# Patient Record
Sex: Female | Born: 1965
Health system: Southern US, Community
[De-identification: ages and names within clinical notes are randomized; demographics above are authoritative.]

## PROBLEM LIST (undated history)

## (undated) DIAGNOSIS — R4701 Aphasia: Secondary | ICD-10-CM

## (undated) DIAGNOSIS — I10 Essential (primary) hypertension: Secondary | ICD-10-CM

## (undated) DIAGNOSIS — Z8673 Personal history of transient ischemic attack (TIA), and cerebral infarction without residual deficits: Secondary | ICD-10-CM

## (undated) DIAGNOSIS — Z8489 Family history of other specified conditions: Secondary | ICD-10-CM

## (undated) DIAGNOSIS — I5022 Chronic systolic (congestive) heart failure: Secondary | ICD-10-CM

## (undated) DIAGNOSIS — I700 Atherosclerosis of aorta: Secondary | ICD-10-CM

## (undated) DIAGNOSIS — E119 Type 2 diabetes mellitus without complications: Secondary | ICD-10-CM

## (undated) DIAGNOSIS — I429 Cardiomyopathy, unspecified: Secondary | ICD-10-CM

## (undated) DIAGNOSIS — I471 Supraventricular tachycardia, unspecified: Secondary | ICD-10-CM

## (undated) DIAGNOSIS — I499 Cardiac arrhythmia, unspecified: Secondary | ICD-10-CM

## (undated) DIAGNOSIS — R569 Unspecified convulsions: Secondary | ICD-10-CM

## (undated) DIAGNOSIS — R911 Solitary pulmonary nodule: Secondary | ICD-10-CM

## (undated) DIAGNOSIS — C801 Malignant (primary) neoplasm, unspecified: Secondary | ICD-10-CM

## (undated) DIAGNOSIS — J45909 Unspecified asthma, uncomplicated: Secondary | ICD-10-CM

---

## 2009-07-28 ENCOUNTER — Emergency Department (HOSPITAL_BASED_OUTPATIENT_CLINIC_OR_DEPARTMENT_OTHER): Admission: EM | Admit: 2009-07-28 | Discharge: 2009-07-28 | Payer: Self-pay | Admitting: Emergency Medicine

## 2009-08-10 HISTORY — PX: ABDOMINAL HYSTERECTOMY: SHX81

## 2009-11-08 ENCOUNTER — Ambulatory Visit: Payer: Self-pay | Admitting: Gynecologic Oncology

## 2009-11-19 ENCOUNTER — Ambulatory Visit: Payer: Self-pay | Admitting: Gynecologic Oncology

## 2009-12-08 ENCOUNTER — Ambulatory Visit: Payer: Self-pay | Admitting: Gynecologic Oncology

## 2009-12-10 ENCOUNTER — Ambulatory Visit: Payer: Self-pay | Admitting: Gynecologic Oncology

## 2010-01-08 ENCOUNTER — Ambulatory Visit: Payer: Self-pay | Admitting: Gynecologic Oncology

## 2010-01-21 ENCOUNTER — Ambulatory Visit: Payer: Self-pay | Admitting: Gynecologic Oncology

## 2010-02-07 ENCOUNTER — Ambulatory Visit: Payer: Self-pay | Admitting: Gynecologic Oncology

## 2010-03-04 ENCOUNTER — Ambulatory Visit: Payer: Self-pay | Admitting: Gynecologic Oncology

## 2010-04-10 ENCOUNTER — Ambulatory Visit: Payer: Self-pay | Admitting: Gynecologic Oncology

## 2010-05-06 ENCOUNTER — Ambulatory Visit: Payer: Self-pay | Admitting: Gynecologic Oncology

## 2010-05-10 ENCOUNTER — Ambulatory Visit: Payer: Self-pay | Admitting: Radiation Oncology

## 2010-05-20 ENCOUNTER — Ambulatory Visit: Payer: Self-pay | Admitting: Gynecologic Oncology

## 2010-05-22 LAB — CA 125: CA 125: 11.3 U/mL (ref 0.0–34.0)

## 2010-06-10 ENCOUNTER — Ambulatory Visit: Payer: Self-pay | Admitting: Radiation Oncology

## 2010-06-10 ENCOUNTER — Ambulatory Visit: Payer: Self-pay | Admitting: Gynecologic Oncology

## 2010-09-23 ENCOUNTER — Ambulatory Visit: Payer: Self-pay | Admitting: Gynecologic Oncology

## 2010-10-09 ENCOUNTER — Ambulatory Visit: Payer: Self-pay | Admitting: Gynecologic Oncology

## 2010-11-10 LAB — CBC
HCT: 42.4 % (ref 36.0–46.0)
MCHC: 33.8 g/dL (ref 30.0–36.0)
MCV: 86.7 fL (ref 78.0–100.0)
Platelets: 294 10*3/uL (ref 150–400)
RDW: 11.6 % (ref 11.5–15.5)

## 2010-11-10 LAB — DIFFERENTIAL
Basophils Absolute: 0.1 10*3/uL (ref 0.0–0.1)
Basophils Relative: 1 % (ref 0–1)
Eosinophils Absolute: 0.3 10*3/uL (ref 0.0–0.7)
Eosinophils Relative: 2 % (ref 0–5)
Monocytes Absolute: 0.7 10*3/uL (ref 0.1–1.0)

## 2011-01-20 ENCOUNTER — Ambulatory Visit: Payer: Self-pay | Admitting: Gynecologic Oncology

## 2011-02-08 ENCOUNTER — Ambulatory Visit: Payer: Self-pay | Admitting: Gynecologic Oncology

## 2011-07-07 ENCOUNTER — Ambulatory Visit: Payer: Self-pay | Admitting: Gynecologic Oncology

## 2011-07-11 ENCOUNTER — Ambulatory Visit: Payer: Self-pay | Admitting: Gynecologic Oncology

## 2011-08-25 ENCOUNTER — Encounter: Payer: Self-pay | Admitting: *Deleted

## 2011-10-02 IMAGING — CT CT ABD-PELV W/ CM
1 of 3 series · 13 of 32 positions shown, 18 images · non-contrast
Comparison: none

REASON FOR EXAM: restaging endometrial CA
COMMENTS:

[Series 2: abd with 5.0 i40f · axial · 0.83mm/px · z∈[-574,-190]mm · 13 of 89 slices shown, 18 images]
[im 6/89  soft-tissue]
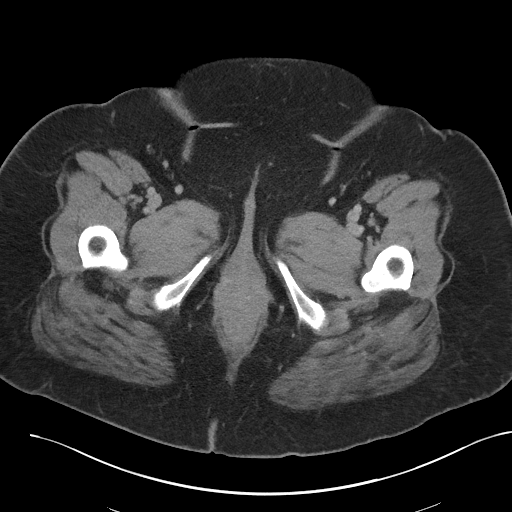
[im 6/89  bone]
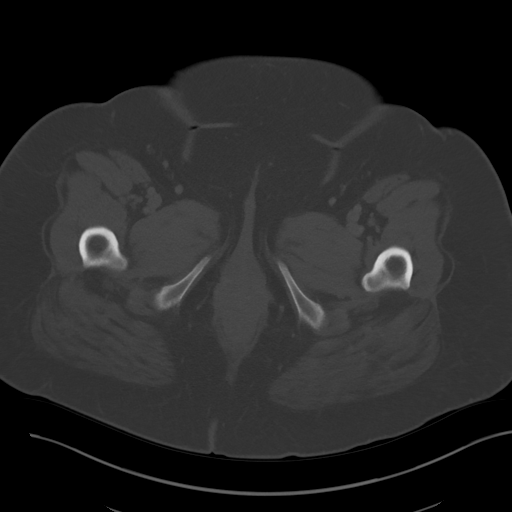
[im 16/89  soft-tissue]
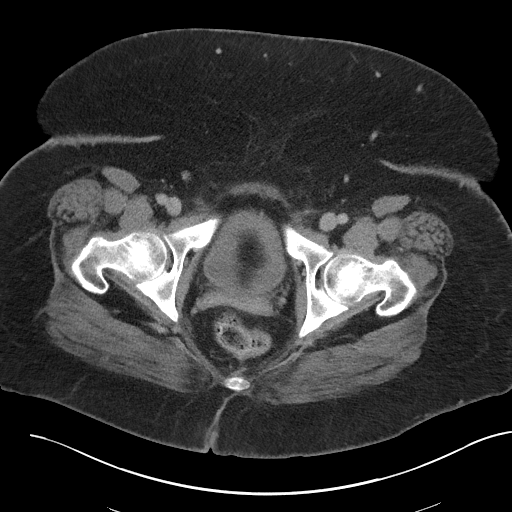
[im 21/89  soft-tissue]
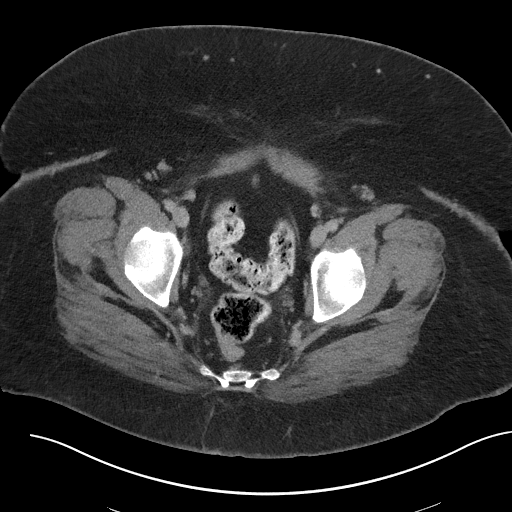
[im 26/89  soft-tissue]
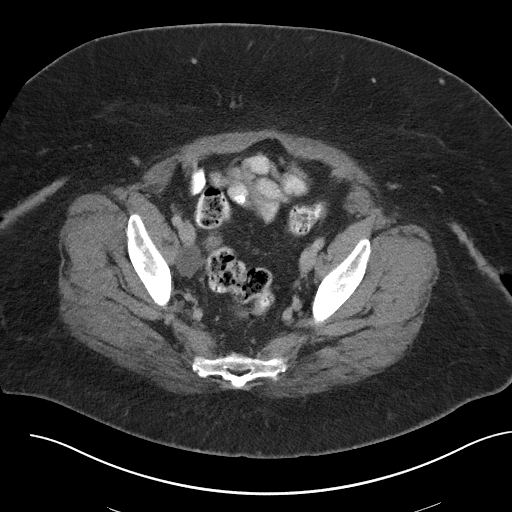
[im 37/89  soft-tissue]
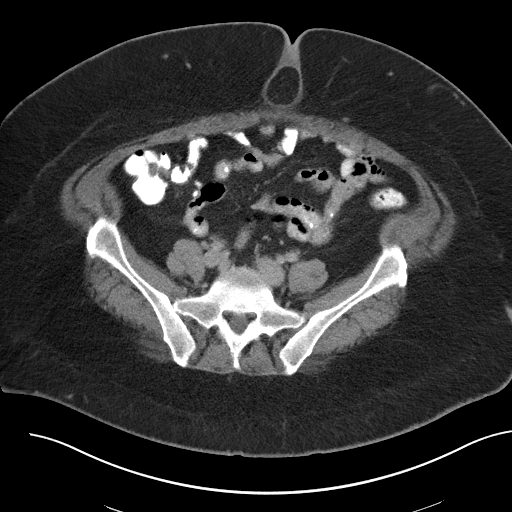
[im 42/89  soft-tissue]
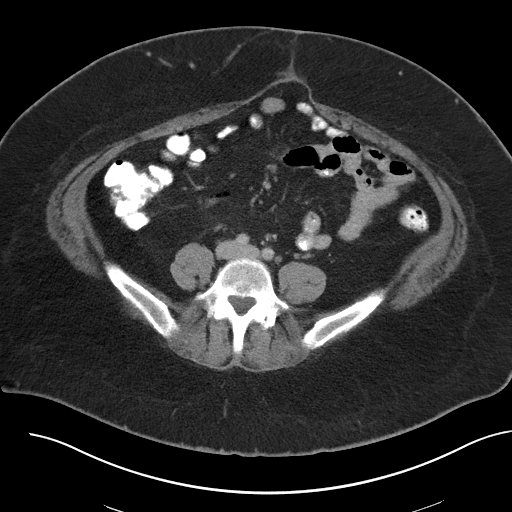
[im 47/89  soft-tissue]
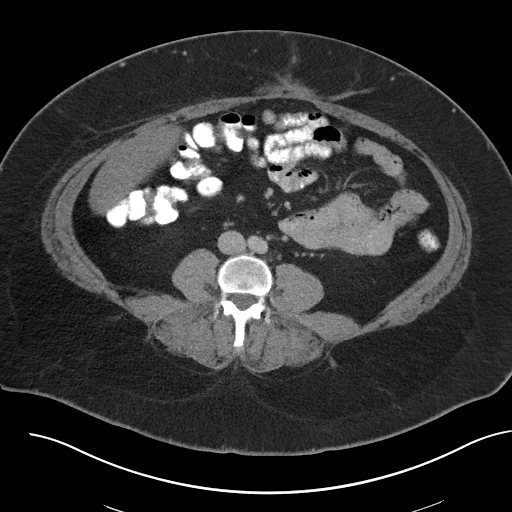
[im 57/89  soft-tissue]
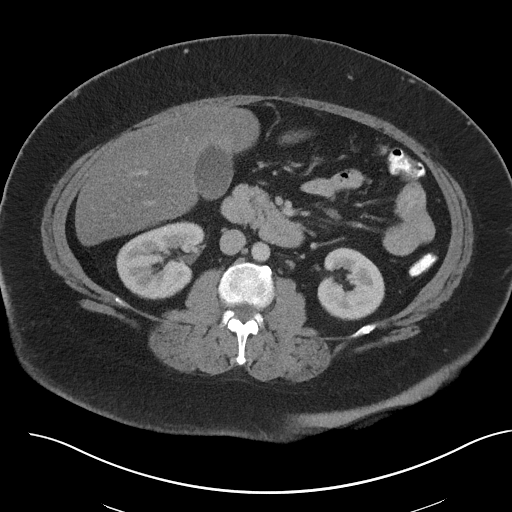
[im 63/89  soft-tissue]
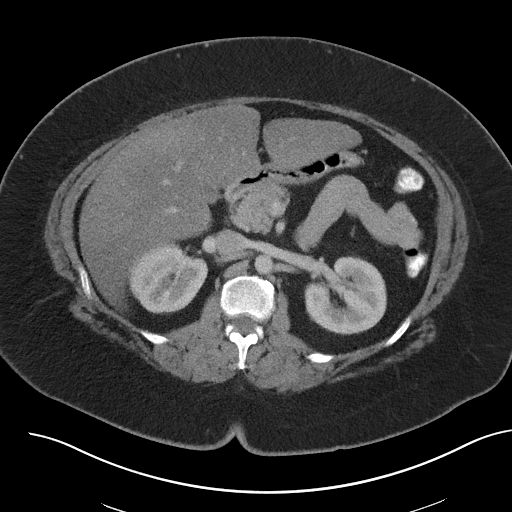
[im 63/89  bone]
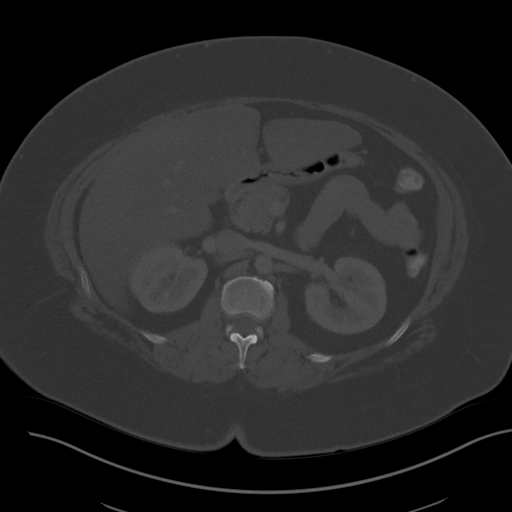
[im 68/89  soft-tissue]
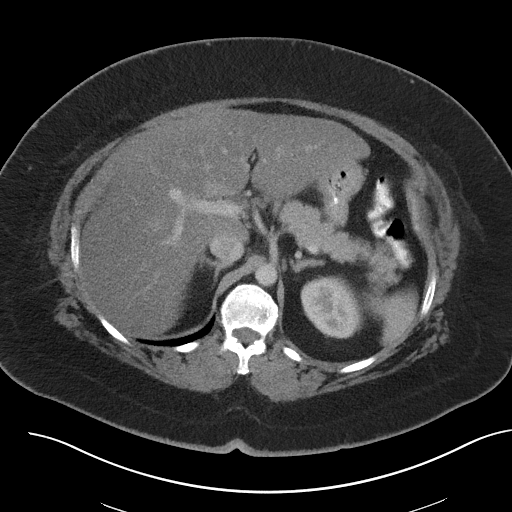
[im 68/89  lung]
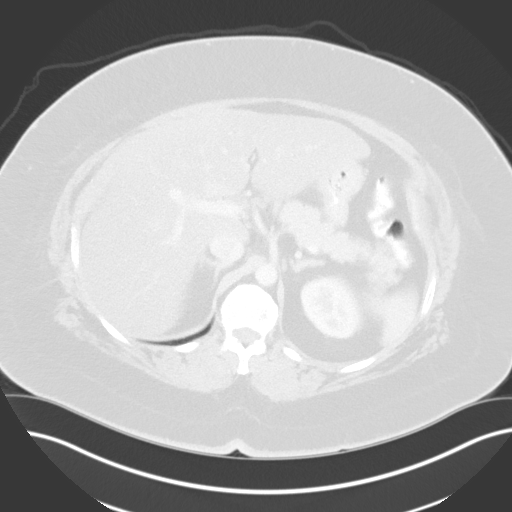
[im 73/89  lung]
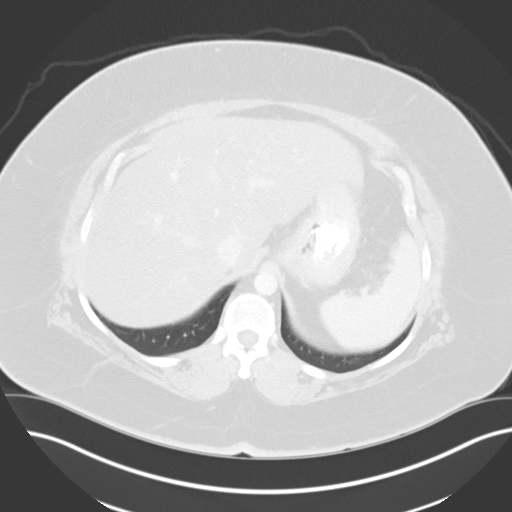
[im 78/89  soft-tissue]
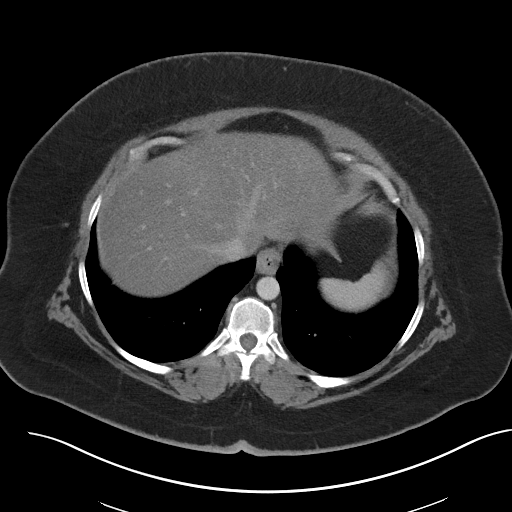
[im 78/89  lung]
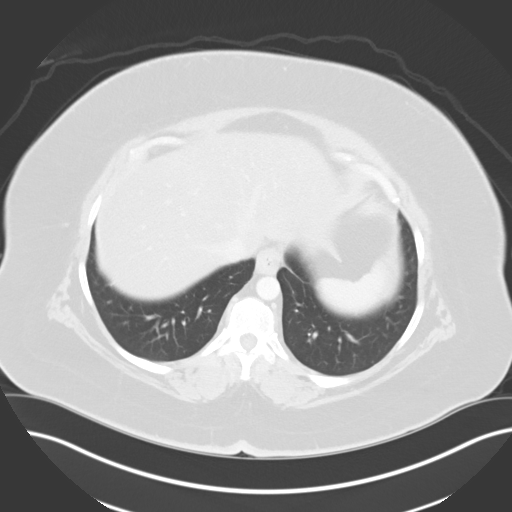
[im 83/89  soft-tissue]
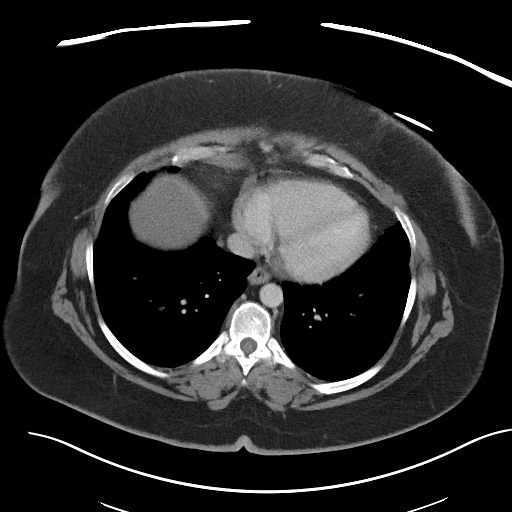
[im 83/89  lung]
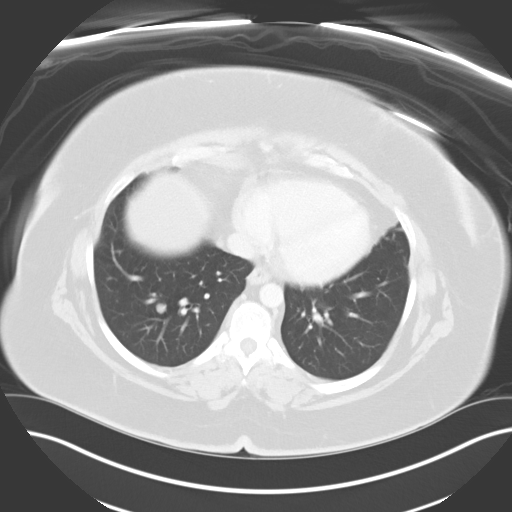

[13 of 32 positions shown; findings below may reference images not displayed]

PROCEDURE:     KCT - KCT ABDOMEN/PELVIS W  - September 23, 2010  [DATE]

RESULT:     Axial CT scanning was performed through the abdomen and pelvis
at 5 mm intervals and slice thicknesses following intravenous administration
of 100 cc of Xsovue-O3G. Review of multiplanar reconstructed images was
performed separately on the VIA monitor. The patient received oral contrast
material in addition to the aforementioned IV contrast.

Within the pelvis the uterus is surgically absent. I see no adnexal mass on
the left. On the right there is a soft tissue density structure with
Hounsfield measurement of +1 which measures approximately 2.5 x 3.2 cm and
may reflect a cystic right ovarian process. The rectosigmoid colon is normal
in appearance. The partially distended urinary bladder is also normal. I see
no inguinal hernia. There is a broadly necked ventral hernia which is fairly
shallow. It measures 3 cm deep and has a neck measuring 6.7 centimeters and
contains loops of normal appearing small bowel. The bowel exhibits no
evidence of ileus or obstruction or acute inflammation elsewhere.

The liver exhibits decreased density diffusely consistent with fatty
infiltration. There is no focal mass nor ductal dilation. The gallbladder is
adequately distended with no evidence of calcified stones. The pancreas,
spleen, partially distended stomach, adrenal glands, and kidneys are normal
in appearance. There is a subcentimeter hypodensity anteriorly in the mid
pole of the left kidney which is too small to accurately characterize with
Hounsfield measurement. The caliber of the abdominal aorta is normal. I see
no periaortic nor pericaval lymphadenopathy.

The lung bases are clear. The lumbar vertebral bodies are preserved in
height with the exception of L1 where there is mild anterior wedge
compression with loss of height of 15%.
IMPRESSION: 1. I do not see evidence of intra-abdominal nor pelvic lymphadenopathy.
There is a hypodensity in the right adnexal region which likely reflects a
cystic ovarian process given that its elsewhere value is +1.
2. I do not see evidence of omental masses or infiltration of the on
mesenteric fat nor ascites.
3. There is a broadly necked ventral hernia containing loops of normal
appearing small bowel.
4. There is decreased density in the liver which likely reflects fatty
infiltration. I see no acute hepatobiliary abnormality nor acute urinary
tract abnormality.

## 2011-11-03 ENCOUNTER — Ambulatory Visit: Payer: Self-pay | Admitting: Gynecologic Oncology

## 2011-11-04 LAB — CA 125: CA 125: 9.8 U/mL (ref 0.0–34.0)

## 2011-11-09 ENCOUNTER — Ambulatory Visit: Payer: Self-pay | Admitting: Gynecologic Oncology

## 2012-05-03 ENCOUNTER — Ambulatory Visit: Payer: Self-pay | Admitting: Gynecologic Oncology

## 2012-05-10 ENCOUNTER — Ambulatory Visit: Payer: Self-pay | Admitting: Gynecologic Oncology

## 2012-10-08 ENCOUNTER — Ambulatory Visit: Payer: Self-pay | Admitting: Gynecologic Oncology

## 2012-11-08 ENCOUNTER — Ambulatory Visit: Payer: Self-pay | Admitting: Gynecologic Oncology

## 2013-07-25 ENCOUNTER — Ambulatory Visit: Payer: Self-pay | Admitting: Gynecologic Oncology

## 2013-08-10 ENCOUNTER — Ambulatory Visit: Payer: Self-pay | Admitting: Gynecologic Oncology

## 2014-12-31 ENCOUNTER — Other Ambulatory Visit: Payer: Self-pay | Admitting: General Surgery

## 2014-12-31 ENCOUNTER — Ambulatory Visit (HOSPITAL_COMMUNITY): Payer: 59 | Admitting: Certified Registered Nurse Anesthetist

## 2014-12-31 ENCOUNTER — Encounter (HOSPITAL_COMMUNITY): Payer: Self-pay | Admitting: *Deleted

## 2014-12-31 ENCOUNTER — Encounter (HOSPITAL_COMMUNITY): Admission: RE | Disposition: A | Discharge status: Home Health | Payer: Self-pay | Source: Ambulatory Visit

## 2014-12-31 ENCOUNTER — Inpatient Hospital Stay (HOSPITAL_COMMUNITY)
Admission: RE | Admit: 2014-12-31 | Discharge: 2015-01-04 | DRG: 571 | Disposition: A | Discharge status: Home Health | Payer: 59 | Source: Ambulatory Visit | Attending: Surgery | Admitting: Surgery

## 2014-12-31 DIAGNOSIS — L02211 Cutaneous abscess of abdominal wall: Secondary | ICD-10-CM | POA: Diagnosis present

## 2014-12-31 DIAGNOSIS — Z8543 Personal history of malignant neoplasm of ovary: Secondary | ICD-10-CM

## 2014-12-31 DIAGNOSIS — D72829 Elevated white blood cell count, unspecified: Secondary | ICD-10-CM | POA: Diagnosis present

## 2014-12-31 DIAGNOSIS — B9561 Methicillin susceptible Staphylococcus aureus infection as the cause of diseases classified elsewhere: Secondary | ICD-10-CM | POA: Diagnosis present

## 2014-12-31 DIAGNOSIS — E65 Localized adiposity: Secondary | ICD-10-CM | POA: Diagnosis present

## 2014-12-31 DIAGNOSIS — F419 Anxiety disorder, unspecified: Secondary | ICD-10-CM | POA: Diagnosis present

## 2014-12-31 DIAGNOSIS — L02231 Carbuncle of abdominal wall: Principal | ICD-10-CM | POA: Diagnosis present

## 2014-12-31 DIAGNOSIS — E119 Type 2 diabetes mellitus without complications: Secondary | ICD-10-CM

## 2014-12-31 DIAGNOSIS — Z6835 Body mass index (BMI) 35.0-35.9, adult: Secondary | ICD-10-CM

## 2014-12-31 DIAGNOSIS — J45909 Unspecified asthma, uncomplicated: Secondary | ICD-10-CM | POA: Diagnosis present

## 2014-12-31 DIAGNOSIS — E669 Obesity, unspecified: Secondary | ICD-10-CM | POA: Diagnosis present

## 2014-12-31 HISTORY — DX: Type 2 diabetes mellitus without complications: E11.9

## 2014-12-31 HISTORY — DX: Essential (primary) hypertension: I10

## 2014-12-31 HISTORY — DX: Cardiac arrhythmia, unspecified: I49.9

## 2014-12-31 HISTORY — DX: Family history of other specified conditions: Z84.89

## 2014-12-31 HISTORY — DX: Unspecified convulsions: R56.9

## 2014-12-31 HISTORY — DX: Malignant (primary) neoplasm, unspecified: C80.1

## 2014-12-31 HISTORY — DX: Unspecified asthma, uncomplicated: J45.909

## 2014-12-31 HISTORY — PX: IRRIGATION AND DEBRIDEMENT ABDOMEN: SHX6600

## 2014-12-31 LAB — CREATININE, SERUM
Creatinine, Ser: 0.51 mg/dL (ref 0.44–1.00)
GFR calc non Af Amer: >60 mL/min (ref >60)

## 2014-12-31 LAB — CBC
HEMATOCRIT: 42.7 % (ref 36.0–46.0)
HEMOGLOBIN: 13.8 g/dL (ref 12.0–15.0)
MCH: 28.8 pg (ref 26.0–34.0)
MCHC: 32.3 g/dL (ref 30.0–36.0)
MCV: 89.1 fL (ref 78.0–100.0)
Platelets: 311 10*3/uL (ref 150–400)
RBC: 4.79 MIL/uL (ref 3.87–5.11)
RDW: 12.7 % (ref 11.5–15.5)
WBC: 19.5 10*3/uL — AB (ref 4.0–10.5)

## 2014-12-31 LAB — GLUCOSE, CAPILLARY
GLUCOSE-CAPILLARY: 199 mg/dL — AB (ref 65–99)
Glucose-Capillary: 126 mg/dL — ABNORMAL HIGH (ref 65–99)
Glucose-Capillary: 92 mg/dL (ref 65–99)

## 2014-12-31 LAB — BASIC METABOLIC PANEL
Anion gap: 14 (ref 5–15)
BUN: 18 mg/dL (ref 6–20)
CALCIUM: 10.2 mg/dL (ref 8.9–10.3)
CO2: 24 mmol/L (ref 22–32)
CREATININE: 0.67 mg/dL (ref 0.44–1.00)
Chloride: 103 mmol/L (ref 101–111)
GFR calc non Af Amer: >60 mL/min (ref >60)
Glucose, Bld: 140 mg/dL — ABNORMAL HIGH (ref 65–99)
Potassium: 3.9 mmol/L (ref 3.5–5.1)
SODIUM: 141 mmol/L (ref 135–145)

## 2014-12-31 SURGERY — IRRIGATION AND DEBRIDEMENT ABDOMEN
Anesthesia: General | Site: Abdomen

## 2014-12-31 MED ORDER — PIPERACILLIN-TAZOBACTAM 3.375 G IVPB
3.3750 g | Freq: Three times a day (TID) | INTRAVENOUS | Status: DC
  Administered 2014-12-31: 3.375 g via INTRAVENOUS
  Filled 2014-12-31: qty 50

## 2014-12-31 MED ORDER — KETOROLAC TROMETHAMINE 30 MG/ML IJ SOLN: 30.0000 mg | Freq: Once | INTRAMUSCULAR | Status: DC | PRN

## 2014-12-31 MED ORDER — OXYCODONE HCL 5 MG PO TABS: 5.0000 mg | ORAL_TABLET | Freq: Once | ORAL | Status: DC | PRN

## 2014-12-31 MED ORDER — HYDROMORPHONE HCL 1 MG/ML IJ SOLN: 0.2500 mg | INTRAMUSCULAR | Status: DC | PRN

## 2014-12-31 MED ORDER — PROPOFOL 10 MG/ML IV BOLUS
INTRAVENOUS | Status: DC | PRN
  Administered 2014-12-31: 160 mg via INTRAVENOUS

## 2014-12-31 MED ORDER — PIPERACILLIN-TAZOBACTAM 3.375 G IVPB
INTRAVENOUS | Status: AC
  Filled 2014-12-31: qty 50

## 2014-12-31 MED ORDER — POTASSIUM CHLORIDE IN NACL 20-0.45 MEQ/L-% IV SOLN
INTRAVENOUS | Status: DC
  Administered 2014-12-31 – 2015-01-04 (×2): via INTRAVENOUS
  Filled 2014-12-31 (×2): qty 1000

## 2014-12-31 MED ORDER — LIDOCAINE HCL (CARDIAC) 20 MG/ML IV SOLN
INTRAVENOUS | Status: DC | PRN
  Administered 2014-12-31: 60 mg via INTRAVENOUS

## 2014-12-31 MED ORDER — FENTANYL CITRATE (PF) 100 MCG/2ML IJ SOLN
INTRAMUSCULAR | Status: AC
  Filled 2014-12-31: qty 2

## 2014-12-31 MED ORDER — INSULIN ASPART 100 UNIT/ML ~~LOC~~ SOLN
0.0000 [IU] | SUBCUTANEOUS | Status: DC
  Administered 2015-01-01 (×2): 3 [IU] via SUBCUTANEOUS

## 2014-12-31 MED ORDER — ONDANSETRON HCL 4 MG/2ML IJ SOLN
INTRAMUSCULAR | Status: AC
  Filled 2014-12-31: qty 2

## 2014-12-31 MED ORDER — FENTANYL CITRATE (PF) 100 MCG/2ML IJ SOLN
INTRAMUSCULAR | Status: DC | PRN
  Administered 2014-12-31: 100 ug via INTRAVENOUS

## 2014-12-31 MED ORDER — MIDAZOLAM HCL 5 MG/5ML IJ SOLN
INTRAMUSCULAR | Status: DC | PRN
  Administered 2014-12-31: 2 mg via INTRAVENOUS

## 2014-12-31 MED ORDER — OXYCODONE HCL 5 MG/5ML PO SOLN
5.0000 mg | Freq: Once | ORAL | Status: DC | PRN
  Filled 2014-12-31: qty 5

## 2014-12-31 MED ORDER — ONDANSETRON HCL 4 MG/2ML IJ SOLN: 4.0000 mg | Freq: Four times a day (QID) | INTRAMUSCULAR | Status: DC | PRN

## 2014-12-31 MED ORDER — ONDANSETRON HCL 4 MG PO TABS: 4.0000 mg | ORAL_TABLET | Freq: Four times a day (QID) | ORAL | Status: DC | PRN

## 2014-12-31 MED ORDER — LIDOCAINE HCL (CARDIAC) 20 MG/ML IV SOLN
INTRAVENOUS | Status: AC
  Filled 2014-12-31: qty 5

## 2014-12-31 MED ORDER — MORPHINE SULFATE 2 MG/ML IJ SOLN
1.0000 mg | INTRAMUSCULAR | Status: DC | PRN
  Administered 2015-01-01: 1 mg via INTRAVENOUS
  Filled 2014-12-31: qty 1

## 2014-12-31 MED ORDER — DEXAMETHASONE SODIUM PHOSPHATE 10 MG/ML IJ SOLN
INTRAMUSCULAR | Status: AC
  Filled 2014-12-31: qty 1

## 2014-12-31 MED ORDER — DEXAMETHASONE SODIUM PHOSPHATE 10 MG/ML IJ SOLN
INTRAMUSCULAR | Status: DC | PRN
  Administered 2014-12-31: 4 mg via INTRAVENOUS

## 2014-12-31 MED ORDER — SUCCINYLCHOLINE CHLORIDE 20 MG/ML IJ SOLN
INTRAMUSCULAR | Status: DC | PRN
  Administered 2014-12-31: 100 mg via INTRAVENOUS

## 2014-12-31 MED ORDER — MIDAZOLAM HCL 2 MG/2ML IJ SOLN
INTRAMUSCULAR | Status: AC
Start: 2014-12-31 — End: 2014-12-31
  Filled 2014-12-31: qty 2

## 2014-12-31 MED ORDER — PROMETHAZINE HCL 25 MG/ML IJ SOLN: 6.2500 mg | INTRAMUSCULAR | Status: DC | PRN

## 2014-12-31 MED ORDER — LACTATED RINGERS IV SOLN
INTRAVENOUS | Status: DC | PRN
  Administered 2014-12-31: 18:00:00 via INTRAVENOUS

## 2014-12-31 MED ORDER — PROPOFOL 10 MG/ML IV BOLUS
INTRAVENOUS | Status: AC
  Filled 2014-12-31: qty 20

## 2014-12-31 MED ORDER — PIPERACILLIN-TAZOBACTAM 3.375 G IVPB
3.3750 g | Freq: Three times a day (TID) | INTRAVENOUS | Status: DC
  Administered 2015-01-01 – 2015-01-03 (×7): 3.375 g via INTRAVENOUS
  Filled 2014-12-31 (×8): qty 50

## 2014-12-31 MED ORDER — ONDANSETRON HCL 4 MG/2ML IJ SOLN
INTRAMUSCULAR | Status: DC | PRN
  Administered 2014-12-31: 4 mg via INTRAVENOUS

## 2014-12-31 MED ORDER — HEPARIN SODIUM (PORCINE) 5000 UNIT/ML IJ SOLN
5000.0000 [IU] | Freq: Three times a day (TID) | INTRAMUSCULAR | Status: DC
  Administered 2014-12-31 – 2015-01-03 (×8): 5000 [IU] via SUBCUTANEOUS
  Filled 2014-12-31 (×14): qty 1

## 2014-12-31 MED ORDER — HYDROCODONE-ACETAMINOPHEN 5-325 MG PO TABS
1.0000 | ORAL_TABLET | ORAL | Status: DC | PRN
  Administered 2015-01-04: 1 via ORAL
  Filled 2014-12-31: qty 1

## 2014-12-31 SURGICAL SUPPLY — 30 items
BLADE SURG 15 STRL LF DISP TIS (BLADE) ×1 IMPLANT
BLADE SURG 15 STRL SS (BLADE) ×2
BNDG GAUZE ELAST 4 BULKY (GAUZE/BANDAGES/DRESSINGS) IMPLANT
COVER SURGICAL LIGHT HANDLE (MISCELLANEOUS) ×3 IMPLANT
DECANTER SPIKE VIAL GLASS SM (MISCELLANEOUS) IMPLANT
DRAPE LAPAROSCOPIC ABDOMINAL (DRAPES) IMPLANT
DRSG PAD ABDOMINAL 8X10 ST (GAUZE/BANDAGES/DRESSINGS) IMPLANT
ELECT REM PT RETURN 9FT ADLT (ELECTROSURGICAL) ×3
ELECTRODE REM PT RTRN 9FT ADLT (ELECTROSURGICAL) ×1 IMPLANT
GAUZE PACKING IODOFORM 2 (PACKING) ×3 IMPLANT
GAUZE SPONGE 4X4 12PLY STRL (GAUZE/BANDAGES/DRESSINGS) ×3 IMPLANT
GLOVE BIO SURGEON STRL SZ7.5 (GLOVE) ×3 IMPLANT
GOWN SPEC L4 XLG W/TWL (GOWN DISPOSABLE) ×3 IMPLANT
GOWN STRL REUS W/TWL LRG LVL3 (GOWN DISPOSABLE) ×6 IMPLANT
GOWN STRL REUS W/TWL XL LVL3 (GOWN DISPOSABLE) ×9 IMPLANT
KIT BASIN OR (CUSTOM PROCEDURE TRAY) ×3 IMPLANT
NEEDLE HYPO 25X1 1.5 SAFETY (NEEDLE) IMPLANT
NS IRRIG 1000ML POUR BTL (IV SOLUTION) ×3 IMPLANT
PAD ABD 8X10 STRL (GAUZE/BANDAGES/DRESSINGS) ×3 IMPLANT
PENCIL BUTTON HOLSTER BLD 10FT (ELECTRODE) ×3 IMPLANT
SPONGE LAP 18X18 X RAY DECT (DISPOSABLE) ×3 IMPLANT
SUT MNCRL AB 4-0 PS2 18 (SUTURE) IMPLANT
SUT VIC AB 3-0 SH 27 (SUTURE)
SUT VIC AB 3-0 SH 27XBRD (SUTURE) IMPLANT
SWAB COLLECTION DEVICE MRSA (MISCELLANEOUS) IMPLANT
SYR BULB 3OZ (MISCELLANEOUS) ×3 IMPLANT
SYR CONTROL 10ML LL (SYRINGE) IMPLANT
TOWEL OR 17X26 10 PK STRL BLUE (TOWEL DISPOSABLE) ×3 IMPLANT
TUBE ANAEROBIC SPECIMEN COL (MISCELLANEOUS) IMPLANT
YANKAUER SUCT BULB TIP NO VENT (SUCTIONS) ×3 IMPLANT

## 2014-12-31 NOTE — H&P (Deleted)
  The note originally documented on this encounter has been moved the the encounter in which it belongs.  

## 2014-12-31 NOTE — Transfer of Care (Signed)
Immediate Anesthesia Transfer of Care Note  Patient: Sarah Lane  Procedure(s) Performed: Procedure(s): IRRIGATION AND DEBRIDEMENT ABDOMINAL WALL ABSCESS (N/A)  Patient Location: PACU  Anesthesia Type:General  Level of Consciousness: awake, alert  and oriented  Airway & Oxygen Therapy: Patient Spontanous Breathing and Patient connected to face mask oxygen  Post-op Assessment: Report given to RN and Post -op Vital signs reviewed and stable  Post vital signs: Reviewed and stable  Last Vitals:  Filed Vitals:   12/31/14 1724  BP: 131/79  Pulse: 111  Temp: 36.8 C  Resp: 16    Complications: No apparent anesthesia complications

## 2014-12-31 NOTE — H&P (Signed)
History of Present Illness Sarah Lane [name] MD; 12/31/2014 4:15 PM) Patient words: recheck abscess.  The patient is a 49 year old female who presents with a subcutaneous abscess. She is referred due to a soft tissue infection on her abdominal wall. She is a non-insulin-dependent diabetic. She states that she developed what looked like a pimple or small area of infection on her right lower abdominal wall 4-5 days ago. She tried treating this with topical medication on her own but it worsened and she saw Dr. Carlis Abbott today. She was referred to our urgent office for evaluation. She denies fever or chills. She has had one previous soft tissue infection on her pannus treated as an outpatient.   Other Problems Elbert Ewings, CMA; 12/31/2014 3:48 PM) Asthma Diabetes Mellitus Oophorectomy Bilateral. Ovarian Cancer  Past Surgical History Elbert Ewings, CMA; 12/31/2014 3:48 PM) Hysterectomy (due to cancer) - Complete  Diagnostic Studies History Elbert Ewings, CMA; 12/31/2014 3:48 PM) Colonoscopy never Mammogram 1-3 years ago Pap Smear 1-5 years ago  Allergies Elbert Ewings, CMA; 12/31/2014 3:48 PM) No Known Drug Allergies05/23/2016  Medication History Elbert Ewings, CMA; 12/31/2014 3:49 PM) No Current Medications Medications Reconciled  Social History Elbert Ewings, CMA; 12/31/2014 3:48 PM) Caffeine use Tea. No alcohol use No drug use Tobacco use Never smoker.  Family History Elbert Ewings, [place]; 12/31/2014 3:48 PM) Breast Cancer Family Members In General. Cancer Family Members In General. Diabetes Mellitus Mother. Heart Disease Father. Migraine Headache Sister.  Pregnancy / Birth History Elbert Ewings, [place]; 12/31/2014 3:48 PM) Age at menarche 2 years.  Review of Systems Elbert Ewings CMA; 12/31/2014 3:48 PM) General Not Present- Appetite Loss, Chills, Fatigue, Fever, Night Sweats, Weight Gain and Weight Loss. Skin Present- Non-Healing Wounds. Not Present- Change in  Wart/Mole, Dryness, Hives, Jaundice, New Lesions, Rash and Ulcer. HEENT Present- Seasonal Allergies. Not Present- Earache, Hearing Loss, Hoarseness, Nose Bleed, Oral Ulcers, Ringing in the Ears, Sinus Pain, Sore Throat, Visual Disturbances, Wears glasses/contact lenses and Yellow Eyes. Respiratory Present- Snoring. Not Present- Bloody sputum, Chronic Cough, Difficulty Breathing and Wheezing. Breast Not Present- Breast Mass, Breast Pain, Nipple Discharge and Skin Changes. Cardiovascular Not Present- Chest Pain, Difficulty Breathing Lying Down, Leg Cramps, Palpitations, Rapid Heart Rate, Shortness of Breath and Swelling of Extremities. Gastrointestinal Not Present- Abdominal Pain, Bloating, Bloody Stool, Change in Bowel Habits, Chronic diarrhea, Constipation, Difficulty Swallowing, Excessive gas, Gets full quickly at meals, Hemorrhoids, Indigestion, Nausea, Rectal Pain and Vomiting. Female Genitourinary Not Present- Frequency, Nocturia, Painful Urination, Pelvic Pain and Urgency. Musculoskeletal Not Present- Back Pain, Joint Pain, Joint Stiffness, Muscle Pain, Muscle Weakness and Swelling of Extremities. Neurological Not Present- Decreased Memory, Fainting, Headaches, Numbness, Seizures, Tingling, Tremor, Trouble walking and Weakness. Psychiatric Not Present- Anxiety, Bipolar, Change in Sleep Pattern, Depression, Fearful and Frequent crying. Endocrine Not Present- Cold Intolerance, Excessive Hunger, Hair Changes, Heat Intolerance, Hot flashes and New Diabetes. Hematology Present- Easy Bruising. Not Present- Excessive bleeding, Gland problems, HIV and Persistent Infections.   Vitals Elbert Ewings CMA; 12/31/2014 3:49 PM) 12/31/2014 3:49 PM Weight: 187 lb Height: 61in Body Surface Area: 1.91 m Body Mass Index: 35.33 kg/m Temp.: 98.41F(Oral)  Pulse: 98 (Regular)  Resp.: 17 (Unlabored)  BP: 130/70 (Sitting, Left Arm, Standard)    Physical Exam Sarah Lane T. Chemere [name] MD; 12/31/2014 4:18  PM) The physical exam findings are as follows: Note:General: Alert, moderately obese Caucasian female, in no distress Skin: Warm and dry. See abdomen below HEENT: No palpable masses or thyromegaly. Sclera nonicteric. Pupils equal round and reactive. Lungs:  Breath sounds clear and equal. No wheezing or increased work of breathing. Cardiovascular: Regular rate and rhythm without murmer. No JVD or edema. Peripheral pulses intact. No carotid bruits. Abdomen: Nondistended. Soft and nontender. No masses palpable. She has a fairly large pannus. On the right side of her pannus is a 5 cm area of necrotic skin with multiple small sinus tracts draining bloody purulent fluid. This is tender. There is an approximately 10-15 cm surrounding area of erythema. Extremities: No edema or joint swelling or deformity. No chronic venous stasis changes. Neurologic: Alert and fully oriented. Gait normal. No focal weakness. Psychiatric: Normal mood and affect. Thought content appropriate with normal judgement and insight    Assessment & Plan Sarah Lane T. Auther Lyerly MD; 12/31/2014 4:26 PM) ABDOMINAL WALL ABSCESS (682.2  L02.211) Impression: This is a large multiloculated abscess or furuncle with significant surrounding cellulitis in this diabetic female. This is on her pannus which probably has somewhat poor blood supply. She will require incision and drainage and debridement under general anesthesia. She will be transferred to the hospital for surgical intervention tonight followed by wound care and IV antibiotics. Current Plans  Schedule for Surgery Incision and drainage of abdominal wall abscess

## 2014-12-31 NOTE — Anesthesia Preprocedure Evaluation (Addendum)
Anesthesia Evaluation  Patient identified by MRN, date of birth, ID band Patient awake    Reviewed: Allergy & Precautions, NPO status , Patient's Chart, lab work & pertinent test results  Airway Mallampati: I  TM Distance: >3 FB Neck ROM: Full    Dental  (+) Dental Advisory Given, Teeth Intact   Pulmonary neg pulmonary ROS,  breath sounds clear to auscultation        Cardiovascular hypertension, Pt. on medications Rhythm:Regular Rate:Normal     Neuro/Psych negative neurological ROS     GI/Hepatic negative GI ROS,   Endo/Other  diabetes, Type 2, Oral Hypoglycemic AgentsMorbid obesity  Renal/GU negative Renal ROS     Musculoskeletal   Abdominal   Peds  Hematology negative hematology ROS (+)   Anesthesia Other Findings   Reproductive/Obstetrics                            Anesthesia Physical Anesthesia Plan  ASA: III and emergent  Anesthesia Plan: General   Post-op Pain Management:    Induction: Intravenous, Rapid sequence and Cricoid pressure planned  Airway Management Planned: Oral ETT  Additional Equipment:   Intra-op Plan:   Post-operative Plan: Extubation in OR  Informed Consent: I have reviewed the patients History and Physical, chart, labs and discussed the procedure including the risks, benefits and alternatives for the proposed anesthesia with the patient or authorized representative who has indicated his/her understanding and acceptance.   Dental advisory given  Plan Discussed with: CRNA  Anesthesia Plan Comments:        Anesthesia Quick Evaluation

## 2014-12-31 NOTE — Anesthesia Procedure Notes (Signed)
Procedure Name: Intubation Date/Time: 12/31/2014 6:47 PM Performed by: Glory Buff Pre-anesthesia Checklist: Patient identified, Emergency Drugs available, Suction available and Patient being monitored Patient Re-evaluated:Patient Re-evaluated prior to inductionOxygen Delivery Method: Circle System Utilized Preoxygenation: Pre-oxygenation with 100% oxygen Intubation Type: IV induction Ventilation: Mask ventilation without difficulty Laryngoscope Size: Miller and 3 Grade View: Grade I Tube type: Oral Tube size: 7.0 mm Number of attempts: 1 Airway Equipment and Method: Stylet and Oral airway Placement Confirmation: ETT inserted through vocal cords under direct vision,  positive ETCO2 and breath sounds checked- equal and bilateral Secured at: 20 cm Tube secured with: Tape Dental Injury: Teeth and Oropharynx as per pre-operative assessment

## 2014-12-31 NOTE — Op Note (Signed)
Surgeon: Kaylyn Lim, MD, FACS  Asst:  none  Anes:  general  Procedure: Wide excision and debridement of right anterior abdominal pannus carbuncle  Diagnosis: Carbuncle advanced  Complications: none  EBL:   8 cc  Drains: none  Description of Procedure:  The patient was taken to OR 1 at [facility].  After anesthesia was administered and the patient was prepped a timeout was performed.  The abscess was well defined on the anterior abdominal wall and was about 5 cm in diameter.  The central area was purplish and had muliple pustules that seemed to connect centrally.  I incised these and was met with grayish pus that was non foul smelling and did not appear to contain any gas.  This was cultured for aerobes and anaerobes.  The underlying abscess and dead fat were debrided and several subcutaneous gray tracts were followed and debrided removing the overlying skin until some perfusion was noted.  The resultant cavity was irrigated with saline and packed with 2 " iodophor gauze.  The area of redness was marked with a purple OR marker.  She was given Zosyn in the OR after the cultures were obtain.    The patient tolerated the procedure well and was taken to the PACU in stable condition.     Matt B. Hassell Done, [place], [facility], [place]

## 2015-01-01 ENCOUNTER — Encounter (HOSPITAL_COMMUNITY): Payer: Self-pay | Admitting: Surgery

## 2015-01-01 DIAGNOSIS — L02231 Carbuncle of abdominal wall: Secondary | ICD-10-CM | POA: Diagnosis present

## 2015-01-01 DIAGNOSIS — E669 Obesity, unspecified: Secondary | ICD-10-CM | POA: Diagnosis present

## 2015-01-01 DIAGNOSIS — E65 Localized adiposity: Secondary | ICD-10-CM | POA: Diagnosis present

## 2015-01-01 DIAGNOSIS — Z6835 Body mass index (BMI) 35.0-35.9, adult: Secondary | ICD-10-CM | POA: Diagnosis not present

## 2015-01-01 DIAGNOSIS — D72829 Elevated white blood cell count, unspecified: Secondary | ICD-10-CM | POA: Diagnosis present

## 2015-01-01 DIAGNOSIS — F419 Anxiety disorder, unspecified: Secondary | ICD-10-CM | POA: Diagnosis present

## 2015-01-01 DIAGNOSIS — Z8543 Personal history of malignant neoplasm of ovary: Secondary | ICD-10-CM | POA: Diagnosis not present

## 2015-01-01 DIAGNOSIS — J45909 Unspecified asthma, uncomplicated: Secondary | ICD-10-CM | POA: Diagnosis present

## 2015-01-01 DIAGNOSIS — L02211 Cutaneous abscess of abdominal wall: Secondary | ICD-10-CM | POA: Diagnosis present

## 2015-01-01 DIAGNOSIS — E119 Type 2 diabetes mellitus without complications: Secondary | ICD-10-CM | POA: Diagnosis present

## 2015-01-01 DIAGNOSIS — B9561 Methicillin susceptible Staphylococcus aureus infection as the cause of diseases classified elsewhere: Secondary | ICD-10-CM | POA: Diagnosis present

## 2015-01-01 LAB — CBC
HEMATOCRIT: 42.9 % (ref 36.0–46.0)
HEMOGLOBIN: 13.8 g/dL (ref 12.0–15.0)
MCH: 28.8 pg (ref 26.0–34.0)
MCHC: 32.2 g/dL (ref 30.0–36.0)
MCV: 89.6 fL (ref 78.0–100.0)
PLATELETS: 340 10*3/uL (ref 150–400)
RBC: 4.79 MIL/uL (ref 3.87–5.11)
RDW: 12.6 % (ref 11.5–15.5)
WBC: 15.6 10*3/uL — ABNORMAL HIGH (ref 4.0–10.5)

## 2015-01-01 LAB — BASIC METABOLIC PANEL
Anion gap: 11 (ref 5–15)
BUN: 18 mg/dL (ref 6–20)
CHLORIDE: 105 mmol/L (ref 101–111)
CO2: 25 mmol/L (ref 22–32)
Calcium: 9.4 mg/dL (ref 8.9–10.3)
Creatinine, Ser: 0.58 mg/dL (ref 0.44–1.00)
GFR calc Af Amer: >60 mL/min (ref >60)
GFR calc non Af Amer: >60 mL/min (ref >60)
Glucose, Bld: 137 mg/dL — ABNORMAL HIGH (ref 65–99)
POTASSIUM: 4.1 mmol/L (ref 3.5–5.1)
SODIUM: 141 mmol/L (ref 135–145)

## 2015-01-01 LAB — GLUCOSE, CAPILLARY
GLUCOSE-CAPILLARY: 201 mg/dL — AB (ref 65–99)
Glucose-Capillary: 125 mg/dL — ABNORMAL HIGH (ref 65–99)
Glucose-Capillary: 109 mg/dL — ABNORMAL HIGH (ref 65–99)
Glucose-Capillary: 101 mg/dL — ABNORMAL HIGH (ref 65–99)
Glucose-Capillary: 106 mg/dL — ABNORMAL HIGH (ref 65–99)

## 2015-01-01 MED ORDER — INSULIN ASPART 100 UNIT/ML ~~LOC~~ SOLN
0.0000 [IU] | Freq: Three times a day (TID) | SUBCUTANEOUS | Status: DC
  Administered 2015-01-02: 4 [IU] via SUBCUTANEOUS
  Administered 2015-01-02: 3 [IU] via SUBCUTANEOUS
  Administered 2015-01-02 – 2015-01-03 (×2): 4 [IU] via SUBCUTANEOUS
  Administered 2015-01-03: 3 [IU] via SUBCUTANEOUS
  Administered 2015-01-03 – 2015-01-04 (×3): 4 [IU] via SUBCUTANEOUS

## 2015-01-01 MED ORDER — MORPHINE SULFATE 2 MG/ML IJ SOLN
1.0000 mg | INTRAMUSCULAR | Status: DC | PRN
  Administered 2015-01-01 – 2015-01-03 (×6): 1 mg via INTRAVENOUS
  Filled 2015-01-01 (×6): qty 1

## 2015-01-01 MED ORDER — INSULIN ASPART 100 UNIT/ML ~~LOC~~ SOLN
0.0000 [IU] | Freq: Every day | SUBCUTANEOUS | Status: DC
  Administered 2015-01-01: 2 [IU] via SUBCUTANEOUS

## 2015-01-01 MED ORDER — DOCUSATE SODIUM 100 MG PO CAPS: 100.0000 mg | ORAL_CAPSULE | Freq: Two times a day (BID) | ORAL | Status: DC | PRN

## 2015-01-01 NOTE — Progress Notes (Signed)
[facility] Progress Note  1 Day Post-Op  Subjective: Pt feels good, no N/V, ambulating well.  Tolerating diet.  No BM yet.  Wonders why this happened to her.    Objective: Vital signs in last 24 hours: Temp:  [98 F (36.7 C)-99.6 F (37.6 C)] 98 F (36.7 C) (05/24 0337) Pulse Rate:  [87-111] 87 (05/24 0337) Resp:  [16-23] 19 (05/24 0337) BP: (113-133)/(62-79) 133/78 mmHg (05/24 0337) SpO2:  [95 %-100 %] 99 % (05/24 0337) Weight:  [84.55 kg (186 lb 6.4 oz)] 84.55 kg (186 lb 6.4 oz) (05/23 1724)    Intake/Output from previous day: 05/23 0701 - 05/24 0700 In: 1361.7 [I.V.:1361.7] Out: 1035 [Urine:1025; Blood:10] Intake/Output this shift:    PE: Gen:  Alert, NAD, pleasant Abdomen:  Right sided abdominal wound which his 5 cm wide by 3.5cm x 2.5cm deep.  Peri-wound ecchymosis noted at periphery and erythema/induration out to 10cm.  Erythema and induration is receding away from the purple marked line.   Lab Results:   Recent Labs  12/31/14 2108 01/01/15 0445  WBC 19.5* 15.6*  HGB 13.8 13.8  HCT 42.7 42.9  PLT 311 340   BMET  Recent Labs  12/31/14 1735 12/31/14 2108 01/01/15 0445  NA 141  --  141  K 3.9  --  4.1  CL 103  --  105  CO2 24  --  25  GLUCOSE 140*  --  137*  BUN 18  --  18  CREATININE 0.67 0.51 0.58  CALCIUM 10.2  --  9.4   PT/INR No results for input(s): LABPROT, INR in the last 72 hours. CMP     Component Value Date/Time   NA 141 01/01/2015 0445   K 4.1 01/01/2015 0445   CL 105 01/01/2015 0445   CO2 25 01/01/2015 0445   GLUCOSE 137* 01/01/2015 0445   BUN 18 01/01/2015 0445   CREATININE 0.58 01/01/2015 0445   CALCIUM 9.4 01/01/2015 0445   GFRNONAA >60 01/01/2015 0445   GFRAA >60 01/01/2015 0445   Lipase  No results found for: LIPASE     Studies/Results: No results found.  Anti-infectives: Anti-infectives    Start     Dose/Rate Route Frequency Ordered Stop   01/01/15 0200  piperacillin-tazobactam (ZOSYN) IVPB  3.375 g     3.375 g 12.5 mL/hr over 240 Minutes Intravenous Every 8 hours 12/31/14 2030     12/31/14 1745  piperacillin-tazobactam (ZOSYN) IVPB 3.375 g  Status:  Discontinued     3.375 g 12.5 mL/hr over 240 Minutes Intravenous Every 8 hours 12/31/14 1728 12/31/14 2017       Assessment/Plan POD #1 s/p Wide excision and debridement of right anterior abdominal panus carbuncle -Dressing changes BID -IV antibiotics (Day #2 Zosyn) -IVF, pain control -Carb mod diet  Leukocytosis -Improved to 15.6, recheck tomorrow  Continue inpatient until pain better controlled and WBC improved     Nat Christen 01/01/2015, 9:00 AM Pager: [phone]

## 2015-01-02 LAB — HEMOGLOBIN A1C
HEMOGLOBIN A1C: 6.9 % — AB (ref 4.8–5.6)
Mean Plasma Glucose: 151 mg/dL

## 2015-01-02 LAB — BASIC METABOLIC PANEL
Anion gap: 9 (ref 5–15)
BUN: 18 mg/dL (ref 6–20)
CALCIUM: 9 mg/dL (ref 8.9–10.3)
CO2: 25 mmol/L (ref 22–32)
Chloride: 103 mmol/L (ref 101–111)
Creatinine, Ser: 0.64 mg/dL (ref 0.44–1.00)
GFR calc Af Amer: >60 mL/min (ref >60)
GFR calc non Af Amer: >60 mL/min (ref >60)
GLUCOSE: 149 mg/dL — AB (ref 65–99)
POTASSIUM: 4 mmol/L (ref 3.5–5.1)
Sodium: 137 mmol/L (ref 135–145)

## 2015-01-02 LAB — GLUCOSE, CAPILLARY
GLUCOSE-CAPILLARY: 135 mg/dL — AB (ref 65–99)
Glucose-Capillary: 135 mg/dL — ABNORMAL HIGH (ref 65–99)
Glucose-Capillary: 163 mg/dL — ABNORMAL HIGH (ref 65–99)
Glucose-Capillary: 165 mg/dL — ABNORMAL HIGH (ref 65–99)

## 2015-01-02 LAB — CBC
HCT: 40.4 % (ref 36.0–46.0)
Hemoglobin: 12.8 g/dL (ref 12.0–15.0)
MCH: 28.4 pg (ref 26.0–34.0)
MCHC: 31.7 g/dL (ref 30.0–36.0)
MCV: 89.6 fL (ref 78.0–100.0)
Platelets: 338 10*3/uL (ref 150–400)
RBC: 4.51 MIL/uL (ref 3.87–5.11)
RDW: 12.7 % (ref 11.5–15.5)
WBC: 13.7 10*3/uL — AB (ref 4.0–10.5)

## 2015-01-02 NOTE — Care Management Note (Addendum)
Case Management Note  Patient Details  Name: Sarah Lane MRN: [id number] Date of Birth: Aug 17, 1965  Subjective/Objective:   49 yo female admitted with carbuncle of abdominal wall.                 Action/Plan: Patient will be going to parents house [address], patient's cell [phone], parents's home number is [phone]. Patient's father has been learning the wound care to assist patient at home. [facility] rep, Cyril Mourning, made aware of [facility] RN order.  Spoke with patient and father via phone and discussed [facility] RN options. They chose [facility] and Kristen, [facility] rep was made aware of referral. Await orders.  Provided patient with [facility] agency choice list and explained that she will possibly have a copy for [facility] services. The patient stated that she would like to follow up with her father and when he comes to visit later today. She stated that he watched the dressing change last night and will watch again tonight to be able to provide wound care at home. Will follow up with patient's father.  Spoke with patient and parents and were asking if [facility] services were covered by insurance. Called and spoke with [employer] rep and she was unable to provided [place] list. L/m with [facility] agency to check patient benefits and any copays for possible follow up upon discharge.  Patient stated that upon discharge she will be staying with her parents. She stated that she has been watching how to do the dressing changes and her parents are coming today and her father is going to watch the dressing chnages so he can assist. She stated that she does not think she needs [facility] RN at this time. Will continue to follow if [facility] services are ordered.  Expected Discharge Date:                  Expected Discharge Plan:  Home/Self Care  In-House Referral:     Discharge planning Services  CM Consult  Post Acute Care Choice:  NA Choice offered to:  NA  DME Arranged:    DME Agency:     HH Arranged:    HH Agency:      Status of Service:  In process, will continue to follow  Medicare Important Message Given:  No Date Medicare IM Given:    Medicare IM give by:    Date Additional Medicare IM Given:    Additional Medicare Important Message give by:     If discussed at [place] of Stay Meetings, dates discussed:    Additional Comments:  Scot Dock, RN 01/02/2015, 10:09 AM

## 2015-01-02 NOTE — Progress Notes (Signed)
2 Days Post-Op  Subjective: Alert. Ambulatory. Cooperative. Says she's having too much pain to go home. Cultures growing gram-positive cocci in clusters. On Zosyn.  Objective: Vital signs in last 24 hours: Temp:  [98 F (36.7 C)-99.1 F (37.3 C)] 98.3 F (36.8 C) (05/25 0457) Pulse Rate:  [85-108] 85 (05/25 0457) Resp:  [20-22] 20 (05/25 0457) BP: (113-121)/(69-76) 118/76 mmHg (05/25 0457) SpO2:  [95 %-98 %] 97 % (05/25 0457) Last BM Date: 01/01/15  Intake/Output from previous day: 05/24 0701 - 05/25 0700 In: 480 [P.O.:480] Out: -  Intake/Output this shift:    General appearance: Alert. A little anxious but in no severe pain or distress. Very pleasant and cooperative. GI: Abdomen obese. Soft. Nondistended. Right-sided abdominal wound shows continued erythema and tenderness but no skin necrosis. Base of wound is  clean and starting to granulate. No purulence or drainage or odor. Erythema seems less. There is moderate tenderness circumferentially, however.  Lab Results:  Results for orders placed or performed during the hospital encounter of 12/31/14 (from the past 24 hour(s))  Glucose, capillary     Status: Abnormal   Collection Time: 01/01/15  7:22 AM  Result Value Ref Range   Glucose-Capillary 109 (H) 65 - 99 mg/dL  Glucose, capillary     Status: Abnormal   Collection Time: 01/01/15 11:46 AM  Result Value Ref Range   Glucose-Capillary 101 (H) 65 - 99 mg/dL  Glucose, capillary     Status: Abnormal   Collection Time: 01/01/15  4:50 PM  Result Value Ref Range   Glucose-Capillary 106 (H) 65 - 99 mg/dL  Glucose, capillary     Status: Abnormal   Collection Time: 01/01/15  9:20 PM  Result Value Ref Range   Glucose-Capillary 201 (H) 65 - 99 mg/dL  CBC     Status: Abnormal   Collection Time: 01/02/15  4:34 AM  Result Value Ref Range   WBC 13.7 (H) 4.0 - 10.5 K/uL   RBC 4.51 3.87 - 5.11 MIL/uL   Hemoglobin 12.8 12.0 - 15.0 g/dL   HCT 40.4 36.0 - 46.0 %   MCV 89.6 78.0 -  100.0 fL   MCH 28.4 26.0 - 34.0 pg   MCHC 31.7 30.0 - 36.0 g/dL   RDW 12.7 11.5 - 15.5 %   Platelets 338 150 - 400 K/uL  Basic metabolic panel     Status: Abnormal   Collection Time: 01/02/15  4:34 AM  Result Value Ref Range   Sodium 137 135 - 145 mmol/L   Potassium 4.0 3.5 - 5.1 mmol/L   Chloride 103 101 - 111 mmol/L   CO2 25 22 - 32 mmol/L   Glucose, Bld 149 (H) 65 - 99 mg/dL   BUN 18 6 - 20 mg/dL   Creatinine, Ser 0.64 0.44 - 1.00 mg/dL   Calcium 9.0 8.9 - 10.3 mg/dL   GFR calc non Af Amer >60 >60 mL/min   GFR calc Af Amer >60 >60 mL/min   Anion gap 9 5 - 15     Studies/Results: No results found.  . heparin  5,000 Units Subcutaneous 3 times per day  . insulin aspart  0-20 Units Subcutaneous TID WC  . insulin aspart  0-5 Units Subcutaneous QHS  . piperacillin-tazobactam (ZOSYN)  IV  3.375 g Intravenous Q8H     Assessment/Plan: s/p Procedure(s): IRRIGATION AND DEBRIDEMENT ABDOMINAL WALL ABSCESS  POD #2. Status post wide excision and debridement of right anterior abdominal pannus carbuncle. Wound looks adequately debrided with infection  control at this time. Stable but too much pain to discharge home Continue twice a day dressing changes Continue IV Zosyn and told cultures are completed with ID and sensitivities Would anticipate that pain would improve daily.  Leukocytosis. Improving. Down to 13.7 today  Diabetes mellitus (NIDDM)glucose 149. Adequate control for inpatient History bilateral oophorectomy and TAH for ovarian cancer Asthma obesity   @PROBHOSP @  LOS: 1 day    Rollan Roger M 01/02/2015  . .prob

## 2015-01-02 NOTE — Anesthesia Postprocedure Evaluation (Signed)
  Anesthesia Post-op Note  Patient: Sarah Lane  Procedure(s) Performed: Procedure(s): IRRIGATION AND DEBRIDEMENT ABDOMINAL WALL ABSCESS (N/A)  Patient Location: PACU  Anesthesia Type:General  Level of Consciousness: awake and alert   Airway and Oxygen Therapy: Patient Spontanous Breathing  Post-op Pain: mild  Post-op Assessment: Post-op Vital signs reviewed  Post-op Vital Signs: Reviewed  Last Vitals:  Filed Vitals:   01/02/15 0457  BP: 118/76  Pulse: 85  Temp: 36.8 C  Resp: 20    Complications: No apparent anesthesia complications

## 2015-01-03 LAB — GLUCOSE, CAPILLARY
Glucose-Capillary: 138 mg/dL — ABNORMAL HIGH (ref 65–99)
Glucose-Capillary: 156 mg/dL — ABNORMAL HIGH (ref 65–99)
Glucose-Capillary: 154 mg/dL — ABNORMAL HIGH (ref 65–99)
Glucose-Capillary: 184 mg/dL — ABNORMAL HIGH (ref 65–99)

## 2015-01-03 LAB — CULTURE, ROUTINE-ABSCESS

## 2015-01-03 MED ORDER — VANCOMYCIN HCL IN DEXTROSE 1-5 GM/200ML-% IV SOLN
1000.0000 mg | Freq: Three times a day (TID) | INTRAVENOUS | Status: DC
  Administered 2015-01-03 – 2015-01-04 (×4): 1000 mg via INTRAVENOUS
  Filled 2015-01-03 (×5): qty 200

## 2015-01-03 MED ORDER — DOXYCYCLINE HYCLATE 100 MG PO TABS
100.0000 mg | ORAL_TABLET | Freq: Two times a day (BID) | ORAL | Status: DC
  Administered 2015-01-03 – 2015-01-04 (×3): 100 mg via ORAL
  Filled 2015-01-03 (×4): qty 1

## 2015-01-03 NOTE — Progress Notes (Addendum)
ANTIBIOTIC CONSULT NOTE - INITIAL  Pharmacy Consult for Vancomcyin Indication: abdominal wall abscess  Allergies  Allergen Reactions  . Codeine Nausea And Vomiting  . Penicillins     Family history of allergry.      Patient Measurements: Height: 5\' 1"  (154.9 cm) Weight: 186 lb 6.4 oz (84.55 kg) IBW/kg (Calculated) : 47.8   Vital Signs: Temp: 97.8 F (36.6 C) (05/26 0502) Temp Source: Oral (05/26 0502) BP: 127/81 mmHg (05/26 0502) Pulse Rate: 82 (05/26 0502) Intake/Output from previous day: 05/25 0701 - 05/26 0700 In: 472.3 [P.O.:240; I.V.:152.3; IV Piggyback:80] Out: -  Intake/Output from this shift:    Labs:  Recent Labs  12/31/14 2108 01/01/15 0445 01/02/15 0434  WBC 19.5* 15.6* 13.7*  HGB 13.8 13.8 12.8  PLT 311 340 338  CREATININE 0.51 0.58 0.64   Estimated Creatinine Clearance: 83 mL/min (by C-G formula based on Cr of 0.64). No results for input(s): VANCOTROUGH, VANCOPEAK, VANCORANDOM, GENTTROUGH, GENTPEAK, GENTRANDOM, TOBRATROUGH, TOBRAPEAK, TOBRARND, AMIKACINPEAK, AMIKACINTROU, AMIKACIN in the last 72 hours.   Microbiology: Recent Results (from the past 720 hour(s))  Culture, routine-abscess     Status: None (Preliminary result)   Collection Time: 12/31/14  6:20 PM  Result Value Ref Range Status   Specimen Description ABSCESS  Final   Special Requests NONE  Final   Gram Stain   Final    FEW WBC PRESENT,BOTH PMN AND MONONUCLEAR NO SQUAMOUS EPITHELIAL CELLS SEEN FEW GRAM POSITIVE COCCI IN PAIRS IN CLUSTERS Performed at [facility]    Culture   Final    ABUNDANT STAPHYLOCOCCUS AUREUS Note: RIFAMPIN AND GENTAMICIN SHOULD NOT BE USED AS SINGLE DRUGS FOR TREATMENT OF STAPH INFECTIONS. Performed at [facility]    Report Status PENDING  Incomplete  Anaerobic culture     Status: None (Preliminary result)   Collection Time: 12/31/14  7:07 PM  Result Value Ref Range Status   Specimen Description ABSCESS  Final   Special Requests NONE   Final   Gram Stain   Final    FEW WBC PRESENT, PREDOMINANTLY PMN NO SQUAMOUS EPITHELIAL CELLS SEEN FEW GRAM POSITIVE COCCI IN PAIRS IN CLUSTERS Performed at [facility]    Culture   Final    NO ANAEROBES ISOLATED; CULTURE IN PROGRESS FOR 5 DAYS Performed at [facility]    Report Status PENDING  Incomplete    Medical History: Past Medical History  Diagnosis Date  . Cancer     ovarian cancer  . Diabetes mellitus without complication   . Hypertension   . Family history of adverse reaction to anesthesia     [place] father had facial swelling  . Dysrhythmia   . Asthma     childhood asthma  . Seizures     petit mals at age 49 treated with medications. Last one age 49     Assessment: 49 yoF with history of diabetes presented with a large multiloculated abscess with significant surrounding cellulitis on her abdominal wall.  She was started on IV Zosyn and underwent I&D in OR on 5/23.  Abscess cultures showing abundant Staph aureus and pharmacy is consulted to start vancomycin.  Zosyn discontinued and doxycycline started.  5/23 >> Zosyn >> 5/26 5/26 >> Vancomycin >> 5/26 >> Doxycycline >>  5/23 abscess culture: abundant S.aureus (sensitivities pending) 5/23 abscess anaerobic culture: no anaerobes isolated, in progress x 5 days  Patient is afebrile, WBC elevated but improving, renal function WNL with estimated CrCl>100 ml/min.  Goal of Therapy:  Vancomycin trough level 15-20 mcg/ml  Plan:  1.  Vancomycin 1g IV q8h. 2.  F/u SCr, trough level, culture results.  Hershal Coria 01/03/2015,7:08 AM

## 2015-01-03 NOTE — Progress Notes (Signed)
3 Days Post-Op  Subjective: Ambulating in hall. Tolerating diet. Still has pain and doesn't feel ready to go home yet. Afebrile. Heart rate 82. No blood work this morning Cultures show gram-positive cocci in pairs and clusters. We'll adjust anabiotic's  Objective: Vital signs in last 24 hours: Temp:  [97.4 F (36.3 C)-98 F (36.7 C)] 97.8 F (36.6 C) (05/26 0502) Pulse Rate:  [80-99] 82 (05/26 0502) Resp:  [16-18] 16 (05/26 0502) BP: (119-129)/(73-81) 127/81 mmHg (05/26 0502) SpO2:  [95 %-97 %] 97 % (05/26 0502) Last BM Date: 01/01/15  Intake/Output from previous day: 05/25 0701 - 05/26 0700 In: 472.3 [P.O.:240; I.V.:152.3; IV Piggyback:80] Out: -  Intake/Output this shift: Total I/O In: 152.3 [I.V.:72.3; IV Piggyback:80] Out: -   General appearance: Alert. Cooperative. Pleasant. Ambulating in hall. Mild anxiety but no physical distress. Incision/Wound:  Abdominal incision right side of abdomen shows erythema is receding. Tissues are softer. Still tender. Slight purplish discoloration to wound edge but no necrosis. Base of wound is clean. No odor or purulence. Repacked.  Lab Results:  Results for orders placed or performed during the hospital encounter of 12/31/14 (from the past 24 hour(s))  Glucose, capillary     Status: Abnormal   Collection Time: 01/02/15  7:23 AM  Result Value Ref Range   Glucose-Capillary 135 (H) 65 - 99 mg/dL   Comment 1 Notify RN   Glucose, capillary     Status: Abnormal   Collection Time: 01/02/15 11:41 AM  Result Value Ref Range   Glucose-Capillary 163 (H) 65 - 99 mg/dL   Comment 1 Notify RN   Glucose, capillary     Status: Abnormal   Collection Time: 01/02/15  5:00 PM  Result Value Ref Range   Glucose-Capillary 165 (H) 65 - 99 mg/dL  Glucose, capillary     Status: Abnormal   Collection Time: 01/02/15 10:02 PM  Result Value Ref Range   Glucose-Capillary 135 (H) 65 - 99 mg/dL     Studies/Results: No results found.  . heparin  5,000  Units Subcutaneous 3 times per day  . insulin aspart  0-20 Units Subcutaneous TID WC  . insulin aspart  0-5 Units Subcutaneous QHS  . piperacillin-tazobactam (ZOSYN)  IV  3.375 g Intravenous Q8H     Assessment/Plan: s/p Procedure(s): IRRIGATION AND DEBRIDEMENT ABDOMINAL WALL ABSCESS  POD #3. Status post wide excision and debridement of right anterior abdominal pannus carbuncle. Wound looks adequately debrided with infection coming under control at this time. Stable but too much pain to discharge home Possibly home tomorrow. Continue twice a day dressing changes Cultures are preliminary, but suggest staph.  Discontinue Zosyn. Start vancomycin. Start doxycycline to be sure she tolerates this prior to discharge tomorrow..  Leukocytosis. Improving. Recheck tomorrow.  Diabetes mellitus (NIDDM)glucoses 135-165. Adequate control for inpatient History bilateral oophorectomy and TAH for ovarian cancer Asthma obesity  @PROBHOSP @  LOS: 2 days    Alyssa Rotondo M 01/03/2015  . .prob

## 2015-01-04 LAB — CBC WITH DIFFERENTIAL/PLATELET
BASOS PCT: 0 % (ref 0–1)
Basophils Absolute: 0 10*3/uL (ref 0.0–0.1)
EOS ABS: 0.3 10*3/uL (ref 0.0–0.7)
EOS PCT: 3 % (ref 0–5)
HCT: 40.8 % (ref 36.0–46.0)
HEMOGLOBIN: 13.5 g/dL (ref 12.0–15.0)
LYMPHS ABS: 4.1 10*3/uL — AB (ref 0.7–4.0)
Lymphocytes Relative: 37 % (ref 12–46)
MCH: 29.4 pg (ref 26.0–34.0)
MCHC: 33.1 g/dL (ref 30.0–36.0)
MCV: 88.9 fL (ref 78.0–100.0)
MONO ABS: 1 10*3/uL (ref 0.1–1.0)
MONOS PCT: 9 % (ref 3–12)
Neutro Abs: 5.7 10*3/uL (ref 1.7–7.7)
Neutrophils Relative %: 51 % (ref 43–77)
PLATELETS: 373 10*3/uL (ref 150–400)
RBC: 4.59 MIL/uL (ref 3.87–5.11)
RDW: 12.7 % (ref 11.5–15.5)
WBC: 11.1 10*3/uL — ABNORMAL HIGH (ref 4.0–10.5)

## 2015-01-04 LAB — GLUCOSE, CAPILLARY
GLUCOSE-CAPILLARY: 183 mg/dL — AB (ref 65–99)
GLUCOSE-CAPILLARY: 176 mg/dL — AB (ref 65–99)

## 2015-01-04 MED ORDER — DOXYCYCLINE HYCLATE 100 MG PO TABS: 100.0000 mg | ORAL_TABLET | Freq: Two times a day (BID) | ORAL | Status: DC

## 2015-01-04 MED ORDER — HYDROCODONE-ACETAMINOPHEN 5-325 MG PO TABS: 1.0000 | ORAL_TABLET | Freq: Four times a day (QID) | ORAL | Status: DC | PRN

## 2015-01-04 MED ORDER — DOCUSATE SODIUM 100 MG PO CAPS: 100.0000 mg | ORAL_CAPSULE | Freq: Two times a day (BID) | ORAL | Status: DC | PRN

## 2015-01-04 NOTE — Discharge Instructions (Addendum)
TAKE DOXYCYCLINE WITH FOOD TWICE A DAY (AM AND PM) OR IT MAY CAUSE NAUSEA AND/OR VOMITING.  Dressing Change A dressing is a material placed over wounds. It keeps the wound clean, dry, and protected from further injury. This provides an environment that favors wound healing.  BEFORE YOU BEGIN  Get your supplies together. Things you may need include:  Saline solution.  Flexible gauze dressing.  Medicated cream.  Tape.  Gloves.  Abdominal dressing pads.  Gauze squares.  Plastic bags.  Take pain medicine 30 minutes before the dressing change if you need it.  Take a shower before you do the first dressing change of the day. Use plastic wrap or a plastic bag to prevent the dressing from getting wet. REMOVING YOUR OLD DRESSING   Wash your hands with soap and water. Dry your hands with a clean towel.  Put on your gloves.  Remove any tape.  Carefully remove the old dressing. If the dressing sticks, you may dampen it with warm water to loosen it, or follow your caregiver's specific directions.  Remove any gauze or packing tape that is in your wound.  Take off your gloves.  Put the gloves, tape, gauze, or any packing tape into a plastic bag. CHANGING YOUR DRESSING  Open the supplies.  Take the cap off the saline solution.  Open the gauze package so that the gauze remains on the inside of the package.  Put on your gloves.  Clean your wound as told by your caregiver.  If you have been told to keep your wound dry, follow those instructions.  Your caregiver may tell you to do one or more of the following:  Pick up the gauze. Pour the saline solution over the gauze. Squeeze out the extra saline solution.  Put medicated cream or other medicine on your wound if you have been told to do so.  Put the solution soaked gauze only in your wound, not on the skin around it.  Pack your wound loosely or as told by your caregiver.  Put dry gauze on your wound.  Put abdominal  dressing pads over the dry gauze if your wet gauze soaks through.  Tape the abdominal dressing pads in place so they will not fall off. Do not wrap the tape completely around the affected part (arm, leg, abdomen).  Wrap the dressing pads with a flexible gauze dressing to secure it in place.  Take off your gloves. Put them in the plastic bag with the old dressing. Tie the bag shut and throw it away.  Keep the dressing clean and dry until your next dressing change.  Wash your hands. SEEK MEDICAL CARE IF:  Your skin around the wound looks red.  Your wound feels more tender or sore.  You see pus in the wound.  Your wound smells bad.  You have a fever.  Your skin around the wound has a rash that itches and burns.  You see black or yellow skin in your wound that was not there before.  You feel nauseous, throw up, and feel very tired. Document Released: 09/03/2004 Document Revised: 10/19/2011 Document Reviewed: 06/08/2011 [facility] Patient Information 2015 [place], [place]. This information is not intended to replace advice given to you by your health care provider. Make sure you discuss any questions you have with your health care provider.

## 2015-01-04 NOTE — Discharge Summary (Signed)
[facility] Discharge Summary   Patient ID: Sarah Lane MRN: [id number] DOB/AGE: 1966-04-16 49 y.o.  Admit date: 12/31/2014 Discharge date: 01/04/2015  Admitting Diagnosis: Abdominal wall abscess Diabetes Type II  Discharge Diagnosis Patient Active Problem List   Diagnosis Date Noted  . Diabetes 12/31/2014  . Carbuncle of abdominal wall May 2016 12/31/2014    Consultants None  Imaging: No results found.  Procedures Dr. Hassell Done (12/31/14) - Wide excision and debridement of right anterior abdominal pannus carbuncle  Hospital Course:  49 year old female who presents with a subcutaneous abscess. She is referred due to a soft tissue infection on her abdominal wall. She is a non-insulin-dependent diabetic. She states that she developed what looked like a pimple or small area of infection on her right lower abdominal wall 4-5 days ago. She tried treating this with topical medication on her own but it worsened and she saw Dr. Carlis Abbott today. She was referred to our urgent office for evaluation. She denies fever or chills. She has had one previous soft tissue infection on her pannus treated as an outpatient.  She was directly admitted to [facility] hospital for abdominal wall abscess.    She underwent procedure listed above.  Tolerated procedure well and was transferred to the floor.  Diet was advanced as tolerated.  However, her WBC remained elevated for several days and her pain with dressing changes was significant and thus she was kept for several days on IV antibiotics and pain meds.  On POD #4, the patient was voiding well, tolerating diet, ambulating well, pain well controlled, vital signs stable, wound c/d/i and felt stable for discharge home.  She will be set up with [facility] services for dressing changes (WD BID dressing changes to the abdominal wound).  Patient will follow up in our office in 2 weeks for a wound check with Dr. Hassell Done and knows to call with questions or concerns.   Cultures show staph aureus, sensitive to oxacillin, TMP-SMX, vancomycin, tetracycline, rifampin, clindamycin.  She will be discharged with additional 7 days of Doxycycline.  She is encouraged to take doxycyline with food to avoid N/V symptoms.     Medication List    TAKE these medications        ALLERGY RELIEF PLUS SINUS PO  Take 1 tablet by mouth 2 (two) times daily.     aspirin 81 MG chewable tablet  Chew 81 mg by mouth daily.     atorvastatin 40 MG tablet  Commonly known as:  LIPITOR  Take 40 mg by mouth daily.     calcium-vitamin D 500-200 MG-UNIT per tablet  Commonly known as:  OSCAL WITH D  Take 1 tablet by mouth 2 (two) times daily.     canagliflozin 300 MG Tabs tablet  Commonly known as:  INVOKANA  Take 300 mg by mouth daily before breakfast.     docusate sodium 100 MG capsule  Commonly known as:  COLACE  Take 1 capsule (100 mg total) by mouth 2 (two) times daily as needed for mild constipation.     doxycycline 100 MG tablet  Commonly known as:  VIBRA-TABS  Take 1 tablet (100 mg total) by mouth every 12 (twelve) hours.     HYDROcodone-acetaminophen 5-325 MG per tablet  Commonly known as:  NORCO/VICODIN  Take 1-2 tablets by mouth every 6 (six) hours as needed for moderate pain.     losartan 100 MG tablet  Commonly known as:  COZAAR  Take 100 mg by mouth  daily.     metFORMIN 1000 MG tablet  Commonly known as:  GLUCOPHAGE  Take 1,000 mg by mouth 2 (two) times daily with a meal.             Follow-up Information    Follow up with Johnathan Hausen B, MD. Schedule an appointment as soon as possible for a visit in 2 weeks.   Specialty:  General Surgery   Why:  For post-operation check.  Call the office for appointment date/time.   Contact information:   [address] [place] [phone]       Signed: Coralie Keens [facility] [phone]  01/04/2015, 7:57 AM

## 2015-01-04 NOTE — Progress Notes (Signed)
Gen. Surgery:  Awake and alert. Afebrile Pain control good. She is ready to go home. She says her father is going to stay with her. Capillary glucose ranging 135-184.  Cultures show staph aureus, sensitive to oxacillin, TMP-SMX, vancomycin, tetracycline, rifampin, clindamycin.  Discharge home today Twice a day dressing changes Doxycycline for 7 days Follow-up with Dr. Johnathan Hausen in one week  [facility]. Dalbert Batman, M.D., [facility] General and Minimally invasive Surgery Breast and Colorectal Surgery

## 2015-01-05 LAB — ANAEROBIC CULTURE

## 2022-07-08 DIAGNOSIS — M549 Dorsalgia, unspecified: Secondary | ICD-10-CM | POA: Diagnosis not present

## 2022-07-08 DIAGNOSIS — R1084 Generalized abdominal pain: Secondary | ICD-10-CM | POA: Diagnosis not present

## 2022-07-08 DIAGNOSIS — M545 Low back pain, unspecified: Secondary | ICD-10-CM | POA: Diagnosis not present

## 2022-07-08 DIAGNOSIS — Z90710 Acquired absence of both cervix and uterus: Secondary | ICD-10-CM | POA: Diagnosis not present

## 2022-07-09 DIAGNOSIS — M549 Dorsalgia, unspecified: Secondary | ICD-10-CM | POA: Diagnosis not present

## 2022-07-09 DIAGNOSIS — Z90710 Acquired absence of both cervix and uterus: Secondary | ICD-10-CM | POA: Diagnosis not present

## 2022-12-09 DIAGNOSIS — Z791 Long term (current) use of non-steroidal anti-inflammatories (NSAID): Secondary | ICD-10-CM | POA: Diagnosis not present

## 2022-12-09 DIAGNOSIS — E119 Type 2 diabetes mellitus without complications: Secondary | ICD-10-CM | POA: Diagnosis not present

## 2022-12-09 DIAGNOSIS — Z6841 Body Mass Index (BMI) 40.0 and over, adult: Secondary | ICD-10-CM | POA: Diagnosis not present

## 2022-12-09 DIAGNOSIS — Z87891 Personal history of nicotine dependence: Secondary | ICD-10-CM | POA: Diagnosis not present

## 2022-12-09 DIAGNOSIS — Z8542 Personal history of malignant neoplasm of other parts of uterus: Secondary | ICD-10-CM | POA: Diagnosis not present

## 2022-12-09 DIAGNOSIS — Z809 Family history of malignant neoplasm, unspecified: Secondary | ICD-10-CM | POA: Diagnosis not present

## 2022-12-09 DIAGNOSIS — Z8249 Family history of ischemic heart disease and other diseases of the circulatory system: Secondary | ICD-10-CM | POA: Diagnosis not present

## 2022-12-09 DIAGNOSIS — Z823 Family history of stroke: Secondary | ICD-10-CM | POA: Diagnosis not present

## 2022-12-09 DIAGNOSIS — Z825 Family history of asthma and other chronic lower respiratory diseases: Secondary | ICD-10-CM | POA: Diagnosis not present

## 2022-12-09 DIAGNOSIS — Z833 Family history of diabetes mellitus: Secondary | ICD-10-CM | POA: Diagnosis not present

## 2023-01-25 DIAGNOSIS — I639 Cerebral infarction, unspecified: Secondary | ICD-10-CM | POA: Diagnosis not present

## 2023-01-25 DIAGNOSIS — Z1152 Encounter for screening for COVID-19: Secondary | ICD-10-CM | POA: Diagnosis not present

## 2023-01-25 DIAGNOSIS — Z79899 Other long term (current) drug therapy: Secondary | ICD-10-CM | POA: Diagnosis not present

## 2023-01-25 DIAGNOSIS — M545 Low back pain, unspecified: Secondary | ICD-10-CM | POA: Diagnosis not present

## 2023-01-25 DIAGNOSIS — E1169 Type 2 diabetes mellitus with other specified complication: Secondary | ICD-10-CM | POA: Diagnosis not present

## 2023-01-25 DIAGNOSIS — I10 Essential (primary) hypertension: Secondary | ICD-10-CM | POA: Diagnosis not present

## 2023-01-25 DIAGNOSIS — Z90710 Acquired absence of both cervix and uterus: Secondary | ICD-10-CM | POA: Diagnosis not present

## 2023-01-25 DIAGNOSIS — Z794 Long term (current) use of insulin: Secondary | ICD-10-CM | POA: Diagnosis not present

## 2023-01-25 DIAGNOSIS — E860 Dehydration: Secondary | ICD-10-CM | POA: Diagnosis not present

## 2023-01-25 DIAGNOSIS — E119 Type 2 diabetes mellitus without complications: Secondary | ICD-10-CM | POA: Diagnosis not present

## 2023-01-25 DIAGNOSIS — I6523 Occlusion and stenosis of bilateral carotid arteries: Secondary | ICD-10-CM | POA: Diagnosis not present

## 2023-01-25 DIAGNOSIS — R29810 Facial weakness: Secondary | ICD-10-CM | POA: Diagnosis not present

## 2023-01-25 DIAGNOSIS — I669 Occlusion and stenosis of unspecified cerebral artery: Secondary | ICD-10-CM | POA: Diagnosis not present

## 2023-01-25 DIAGNOSIS — R112 Nausea with vomiting, unspecified: Secondary | ICD-10-CM | POA: Diagnosis not present

## 2023-01-25 DIAGNOSIS — R109 Unspecified abdominal pain: Secondary | ICD-10-CM | POA: Diagnosis not present

## 2023-01-26 DIAGNOSIS — E1169 Type 2 diabetes mellitus with other specified complication: Secondary | ICD-10-CM | POA: Diagnosis not present

## 2023-01-26 DIAGNOSIS — E860 Dehydration: Secondary | ICD-10-CM | POA: Diagnosis not present

## 2023-01-26 DIAGNOSIS — I5020 Unspecified systolic (congestive) heart failure: Secondary | ICD-10-CM | POA: Diagnosis not present

## 2023-01-26 DIAGNOSIS — I69351 Hemiplegia and hemiparesis following cerebral infarction affecting right dominant side: Secondary | ICD-10-CM | POA: Diagnosis not present

## 2023-01-26 DIAGNOSIS — Z59812 Housing instability, housed, homelessness in past 12 months: Secondary | ICD-10-CM | POA: Diagnosis not present

## 2023-01-26 DIAGNOSIS — Z1152 Encounter for screening for COVID-19: Secondary | ICD-10-CM | POA: Diagnosis not present

## 2023-01-26 DIAGNOSIS — R059 Cough, unspecified: Secondary | ICD-10-CM | POA: Diagnosis not present

## 2023-01-26 DIAGNOSIS — I669 Occlusion and stenosis of unspecified cerebral artery: Secondary | ICD-10-CM | POA: Diagnosis not present

## 2023-01-26 DIAGNOSIS — R109 Unspecified abdominal pain: Secondary | ICD-10-CM | POA: Diagnosis not present

## 2023-01-26 DIAGNOSIS — I69320 Aphasia following cerebral infarction: Secondary | ICD-10-CM | POA: Diagnosis not present

## 2023-01-26 DIAGNOSIS — R9431 Abnormal electrocardiogram [ECG] [EKG]: Secondary | ICD-10-CM | POA: Diagnosis not present

## 2023-01-26 DIAGNOSIS — Z90710 Acquired absence of both cervix and uterus: Secondary | ICD-10-CM | POA: Diagnosis not present

## 2023-01-26 DIAGNOSIS — Z7722 Contact with and (suspected) exposure to environmental tobacco smoke (acute) (chronic): Secondary | ICD-10-CM | POA: Diagnosis not present

## 2023-01-26 DIAGNOSIS — I10 Essential (primary) hypertension: Secondary | ICD-10-CM | POA: Diagnosis not present

## 2023-01-26 DIAGNOSIS — I471 Supraventricular tachycardia, unspecified: Secondary | ICD-10-CM | POA: Diagnosis not present

## 2023-01-26 DIAGNOSIS — Z7984 Long term (current) use of oral hypoglycemic drugs: Secondary | ICD-10-CM | POA: Diagnosis not present

## 2023-01-26 DIAGNOSIS — R4701 Aphasia: Secondary | ICD-10-CM | POA: Diagnosis not present

## 2023-01-26 DIAGNOSIS — I69322 Dysarthria following cerebral infarction: Secondary | ICD-10-CM | POA: Diagnosis not present

## 2023-01-26 DIAGNOSIS — Z8543 Personal history of malignant neoplasm of ovary: Secondary | ICD-10-CM | POA: Diagnosis not present

## 2023-01-26 DIAGNOSIS — I517 Cardiomegaly: Secondary | ICD-10-CM | POA: Diagnosis not present

## 2023-01-26 DIAGNOSIS — G8101 Flaccid hemiplegia affecting right dominant side: Secondary | ICD-10-CM | POA: Diagnosis not present

## 2023-01-26 DIAGNOSIS — R7989 Other specified abnormal findings of blood chemistry: Secondary | ICD-10-CM | POA: Diagnosis not present

## 2023-01-26 DIAGNOSIS — I69391 Dysphagia following cerebral infarction: Secondary | ICD-10-CM | POA: Diagnosis not present

## 2023-01-26 DIAGNOSIS — I63512 Cerebral infarction due to unspecified occlusion or stenosis of left middle cerebral artery: Secondary | ICD-10-CM | POA: Diagnosis not present

## 2023-01-26 DIAGNOSIS — I69315 Cognitive social or emotional deficit following cerebral infarction: Secondary | ICD-10-CM | POA: Diagnosis not present

## 2023-01-26 DIAGNOSIS — I639 Cerebral infarction, unspecified: Secondary | ICD-10-CM | POA: Diagnosis not present

## 2023-01-26 DIAGNOSIS — I6523 Occlusion and stenosis of bilateral carotid arteries: Secondary | ICD-10-CM | POA: Diagnosis not present

## 2023-01-26 DIAGNOSIS — Z6841 Body Mass Index (BMI) 40.0 and over, adult: Secondary | ICD-10-CM | POA: Diagnosis not present

## 2023-01-26 DIAGNOSIS — I5023 Acute on chronic systolic (congestive) heart failure: Secondary | ICD-10-CM | POA: Diagnosis not present

## 2023-01-26 DIAGNOSIS — N281 Cyst of kidney, acquired: Secondary | ICD-10-CM | POA: Diagnosis not present

## 2023-01-26 DIAGNOSIS — R29713 NIHSS score 13: Secondary | ICD-10-CM | POA: Diagnosis not present

## 2023-01-26 DIAGNOSIS — E669 Obesity, unspecified: Secondary | ICD-10-CM | POA: Diagnosis not present

## 2023-01-26 DIAGNOSIS — G8191 Hemiplegia, unspecified affecting right dominant side: Secondary | ICD-10-CM | POA: Diagnosis not present

## 2023-01-26 DIAGNOSIS — B9620 Unspecified Escherichia coli [E. coli] as the cause of diseases classified elsewhere: Secondary | ICD-10-CM | POA: Diagnosis not present

## 2023-01-26 DIAGNOSIS — I428 Other cardiomyopathies: Secondary | ICD-10-CM | POA: Diagnosis not present

## 2023-01-26 DIAGNOSIS — R29810 Facial weakness: Secondary | ICD-10-CM | POA: Diagnosis not present

## 2023-01-26 DIAGNOSIS — R0602 Shortness of breath: Secondary | ICD-10-CM | POA: Diagnosis not present

## 2023-01-26 DIAGNOSIS — R1110 Vomiting, unspecified: Secondary | ICD-10-CM | POA: Diagnosis not present

## 2023-01-26 DIAGNOSIS — R1310 Dysphagia, unspecified: Secondary | ICD-10-CM | POA: Diagnosis not present

## 2023-01-26 DIAGNOSIS — R112 Nausea with vomiting, unspecified: Secondary | ICD-10-CM | POA: Diagnosis not present

## 2023-01-26 DIAGNOSIS — Z79899 Other long term (current) drug therapy: Secondary | ICD-10-CM | POA: Diagnosis not present

## 2023-01-26 DIAGNOSIS — I5082 Biventricular heart failure: Secondary | ICD-10-CM | POA: Diagnosis not present

## 2023-01-26 DIAGNOSIS — E1165 Type 2 diabetes mellitus with hyperglycemia: Secondary | ICD-10-CM | POA: Diagnosis not present

## 2023-01-26 DIAGNOSIS — R911 Solitary pulmonary nodule: Secondary | ICD-10-CM | POA: Diagnosis not present

## 2023-01-26 DIAGNOSIS — I5033 Acute on chronic diastolic (congestive) heart failure: Secondary | ICD-10-CM | POA: Diagnosis not present

## 2023-01-26 DIAGNOSIS — I63312 Cerebral infarction due to thrombosis of left middle cerebral artery: Secondary | ICD-10-CM | POA: Diagnosis not present

## 2023-01-26 DIAGNOSIS — M25561 Pain in right knee: Secondary | ICD-10-CM | POA: Diagnosis not present

## 2023-01-26 DIAGNOSIS — Z888 Allergy status to other drugs, medicaments and biological substances status: Secondary | ICD-10-CM | POA: Diagnosis not present

## 2023-01-26 DIAGNOSIS — K760 Fatty (change of) liver, not elsewhere classified: Secondary | ICD-10-CM | POA: Diagnosis not present

## 2023-01-26 DIAGNOSIS — I110 Hypertensive heart disease with heart failure: Secondary | ICD-10-CM | POA: Diagnosis not present

## 2023-01-26 DIAGNOSIS — E119 Type 2 diabetes mellitus without complications: Secondary | ICD-10-CM | POA: Diagnosis not present

## 2023-01-26 DIAGNOSIS — Z59819 Housing instability, housed unspecified: Secondary | ICD-10-CM | POA: Diagnosis not present

## 2023-01-26 DIAGNOSIS — E785 Hyperlipidemia, unspecified: Secondary | ICD-10-CM | POA: Diagnosis not present

## 2023-01-26 DIAGNOSIS — Z9889 Other specified postprocedural states: Secondary | ICD-10-CM | POA: Diagnosis not present

## 2023-01-26 DIAGNOSIS — I69328 Other speech and language deficits following cerebral infarction: Secondary | ICD-10-CM | POA: Diagnosis not present

## 2023-01-26 DIAGNOSIS — N390 Urinary tract infection, site not specified: Secondary | ICD-10-CM | POA: Diagnosis not present

## 2023-01-26 DIAGNOSIS — Z794 Long term (current) use of insulin: Secondary | ICD-10-CM | POA: Diagnosis not present

## 2023-02-20 DIAGNOSIS — I63512 Cerebral infarction due to unspecified occlusion or stenosis of left middle cerebral artery: Secondary | ICD-10-CM | POA: Diagnosis not present

## 2023-02-20 DIAGNOSIS — I10 Essential (primary) hypertension: Secondary | ICD-10-CM | POA: Diagnosis not present

## 2023-02-20 DIAGNOSIS — I639 Cerebral infarction, unspecified: Secondary | ICD-10-CM | POA: Diagnosis not present

## 2023-03-11 DIAGNOSIS — I639 Cerebral infarction, unspecified: Secondary | ICD-10-CM | POA: Diagnosis not present

## 2023-03-11 DIAGNOSIS — Z1211 Encounter for screening for malignant neoplasm of colon: Secondary | ICD-10-CM | POA: Diagnosis not present

## 2023-03-11 DIAGNOSIS — E785 Hyperlipidemia, unspecified: Secondary | ICD-10-CM | POA: Diagnosis not present

## 2023-03-11 DIAGNOSIS — E118 Type 2 diabetes mellitus with unspecified complications: Secondary | ICD-10-CM | POA: Diagnosis not present

## 2023-03-11 DIAGNOSIS — Z09 Encounter for follow-up examination after completed treatment for conditions other than malignant neoplasm: Secondary | ICD-10-CM | POA: Diagnosis not present

## 2023-03-11 DIAGNOSIS — I10 Essential (primary) hypertension: Secondary | ICD-10-CM | POA: Diagnosis not present

## 2023-03-11 DIAGNOSIS — Z23 Encounter for immunization: Secondary | ICD-10-CM | POA: Diagnosis not present

## 2023-03-20 DIAGNOSIS — Z1211 Encounter for screening for malignant neoplasm of colon: Secondary | ICD-10-CM | POA: Diagnosis not present

## 2023-03-25 LAB — COLOGUARD: COLOGUARD: NEGATIVE

## 2023-03-25 LAB — EXTERNAL GENERIC LAB PROCEDURE: COLOGUARD: NEGATIVE

## 2023-03-31 ENCOUNTER — Other Ambulatory Visit: Payer: Self-pay

## 2023-03-31 ENCOUNTER — Inpatient Hospital Stay: Payer: 59

## 2023-03-31 ENCOUNTER — Inpatient Hospital Stay
Admission: EM | Admit: 2023-03-31 | Discharge: 2023-04-07 | DRG: 682 | Disposition: A | Discharge status: Home Health | Payer: 59 | Attending: Hospitalist | Admitting: Hospitalist

## 2023-03-31 ENCOUNTER — Emergency Department: Payer: 59

## 2023-03-31 ENCOUNTER — Encounter: Payer: Self-pay | Admitting: Emergency Medicine

## 2023-03-31 DIAGNOSIS — E161 Other hypoglycemia: Secondary | ICD-10-CM | POA: Diagnosis not present

## 2023-03-31 DIAGNOSIS — I5020 Unspecified systolic (congestive) heart failure: Secondary | ICD-10-CM | POA: Diagnosis not present

## 2023-03-31 DIAGNOSIS — E8721 Acute metabolic acidosis: Secondary | ICD-10-CM | POA: Diagnosis not present

## 2023-03-31 DIAGNOSIS — I10 Essential (primary) hypertension: Secondary | ICD-10-CM | POA: Diagnosis not present

## 2023-03-31 DIAGNOSIS — R571 Hypovolemic shock: Secondary | ICD-10-CM | POA: Diagnosis not present

## 2023-03-31 DIAGNOSIS — R109 Unspecified abdominal pain: Secondary | ICD-10-CM | POA: Diagnosis not present

## 2023-03-31 DIAGNOSIS — I5023 Acute on chronic systolic (congestive) heart failure: Secondary | ICD-10-CM | POA: Diagnosis not present

## 2023-03-31 DIAGNOSIS — G9340 Encephalopathy, unspecified: Secondary | ICD-10-CM | POA: Diagnosis not present

## 2023-03-31 DIAGNOSIS — A419 Sepsis, unspecified organism: Secondary | ICD-10-CM | POA: Diagnosis not present

## 2023-03-31 DIAGNOSIS — R0602 Shortness of breath: Secondary | ICD-10-CM | POA: Diagnosis not present

## 2023-03-31 DIAGNOSIS — I5022 Chronic systolic (congestive) heart failure: Secondary | ICD-10-CM | POA: Diagnosis not present

## 2023-03-31 DIAGNOSIS — Z7984 Long term (current) use of oral hypoglycemic drugs: Secondary | ICD-10-CM | POA: Diagnosis not present

## 2023-03-31 DIAGNOSIS — G9341 Metabolic encephalopathy: Secondary | ICD-10-CM | POA: Diagnosis not present

## 2023-03-31 DIAGNOSIS — R748 Abnormal levels of other serum enzymes: Secondary | ICD-10-CM | POA: Diagnosis not present

## 2023-03-31 DIAGNOSIS — E1110 Type 2 diabetes mellitus with ketoacidosis without coma: Secondary | ICD-10-CM | POA: Diagnosis not present

## 2023-03-31 DIAGNOSIS — I2510 Atherosclerotic heart disease of native coronary artery without angina pectoris: Secondary | ICD-10-CM | POA: Diagnosis not present

## 2023-03-31 DIAGNOSIS — G9389 Other specified disorders of brain: Secondary | ICD-10-CM | POA: Diagnosis not present

## 2023-03-31 DIAGNOSIS — I6782 Cerebral ischemia: Secondary | ICD-10-CM | POA: Diagnosis not present

## 2023-03-31 DIAGNOSIS — I428 Other cardiomyopathies: Secondary | ICD-10-CM | POA: Diagnosis not present

## 2023-03-31 DIAGNOSIS — Z79899 Other long term (current) drug therapy: Secondary | ICD-10-CM | POA: Diagnosis not present

## 2023-03-31 DIAGNOSIS — Z794 Long term (current) use of insulin: Secondary | ICD-10-CM | POA: Diagnosis not present

## 2023-03-31 DIAGNOSIS — R000 Tachycardia, unspecified: Secondary | ICD-10-CM | POA: Diagnosis not present

## 2023-03-31 DIAGNOSIS — R9431 Abnormal electrocardiogram [ECG] [EKG]: Secondary | ICD-10-CM | POA: Diagnosis not present

## 2023-03-31 DIAGNOSIS — R4701 Aphasia: Secondary | ICD-10-CM | POA: Diagnosis not present

## 2023-03-31 DIAGNOSIS — G40909 Epilepsy, unspecified, not intractable, without status epilepticus: Secondary | ICD-10-CM | POA: Diagnosis not present

## 2023-03-31 DIAGNOSIS — I2489 Other forms of acute ischemic heart disease: Secondary | ICD-10-CM | POA: Diagnosis not present

## 2023-03-31 DIAGNOSIS — E876 Hypokalemia: Secondary | ICD-10-CM | POA: Diagnosis not present

## 2023-03-31 DIAGNOSIS — N179 Acute kidney failure, unspecified: Secondary | ICD-10-CM | POA: Diagnosis not present

## 2023-03-31 DIAGNOSIS — J45909 Unspecified asthma, uncomplicated: Secondary | ICD-10-CM | POA: Diagnosis not present

## 2023-03-31 DIAGNOSIS — N3289 Other specified disorders of bladder: Secondary | ICD-10-CM | POA: Diagnosis not present

## 2023-03-31 DIAGNOSIS — E8729 Other acidosis: Secondary | ICD-10-CM | POA: Diagnosis not present

## 2023-03-31 DIAGNOSIS — I700 Atherosclerosis of aorta: Secondary | ICD-10-CM | POA: Diagnosis not present

## 2023-03-31 DIAGNOSIS — G459 Transient cerebral ischemic attack, unspecified: Secondary | ICD-10-CM | POA: Diagnosis not present

## 2023-03-31 DIAGNOSIS — R911 Solitary pulmonary nodule: Secondary | ICD-10-CM | POA: Diagnosis not present

## 2023-03-31 DIAGNOSIS — E1129 Type 2 diabetes mellitus with other diabetic kidney complication: Secondary | ICD-10-CM | POA: Diagnosis not present

## 2023-03-31 DIAGNOSIS — Z20822 Contact with and (suspected) exposure to covid-19: Secondary | ICD-10-CM | POA: Diagnosis not present

## 2023-03-31 DIAGNOSIS — I272 Pulmonary hypertension, unspecified: Secondary | ICD-10-CM | POA: Diagnosis not present

## 2023-03-31 DIAGNOSIS — R0789 Other chest pain: Secondary | ICD-10-CM | POA: Diagnosis not present

## 2023-03-31 DIAGNOSIS — E872 Acidosis, unspecified: Secondary | ICD-10-CM | POA: Diagnosis not present

## 2023-03-31 DIAGNOSIS — R7989 Other specified abnormal findings of blood chemistry: Secondary | ICD-10-CM | POA: Diagnosis not present

## 2023-03-31 DIAGNOSIS — R809 Proteinuria, unspecified: Secondary | ICD-10-CM | POA: Diagnosis not present

## 2023-03-31 DIAGNOSIS — I4719 Other supraventricular tachycardia: Secondary | ICD-10-CM | POA: Diagnosis not present

## 2023-03-31 DIAGNOSIS — K859 Acute pancreatitis without necrosis or infection, unspecified: Secondary | ICD-10-CM | POA: Diagnosis not present

## 2023-03-31 DIAGNOSIS — I25118 Atherosclerotic heart disease of native coronary artery with other forms of angina pectoris: Secondary | ICD-10-CM | POA: Diagnosis not present

## 2023-03-31 DIAGNOSIS — R531 Weakness: Secondary | ICD-10-CM | POA: Diagnosis not present

## 2023-03-31 DIAGNOSIS — C679 Malignant neoplasm of bladder, unspecified: Secondary | ICD-10-CM | POA: Diagnosis not present

## 2023-03-31 DIAGNOSIS — I6381 Other cerebral infarction due to occlusion or stenosis of small artery: Secondary | ICD-10-CM | POA: Diagnosis not present

## 2023-03-31 DIAGNOSIS — N170 Acute kidney failure with tubular necrosis: Secondary | ICD-10-CM | POA: Diagnosis not present

## 2023-03-31 DIAGNOSIS — Z886 Allergy status to analgesic agent status: Secondary | ICD-10-CM

## 2023-03-31 DIAGNOSIS — Z8673 Personal history of transient ischemic attack (TIA), and cerebral infarction without residual deficits: Secondary | ICD-10-CM

## 2023-03-31 DIAGNOSIS — Z90710 Acquired absence of both cervix and uterus: Secondary | ICD-10-CM

## 2023-03-31 DIAGNOSIS — Z6835 Body mass index (BMI) 35.0-35.9, adult: Secondary | ICD-10-CM

## 2023-03-31 DIAGNOSIS — Z820 Family history of epilepsy and other diseases of the nervous system: Secondary | ICD-10-CM

## 2023-03-31 DIAGNOSIS — Z880 Allergy status to penicillin: Secondary | ICD-10-CM

## 2023-03-31 DIAGNOSIS — Z8543 Personal history of malignant neoplasm of ovary: Secondary | ICD-10-CM

## 2023-03-31 DIAGNOSIS — Z8249 Family history of ischemic heart disease and other diseases of the circulatory system: Secondary | ICD-10-CM

## 2023-03-31 DIAGNOSIS — E1169 Type 2 diabetes mellitus with other specified complication: Secondary | ICD-10-CM | POA: Diagnosis not present

## 2023-03-31 DIAGNOSIS — R1310 Dysphagia, unspecified: Secondary | ICD-10-CM | POA: Diagnosis present

## 2023-03-31 DIAGNOSIS — I110 Hypertensive heart disease with heart failure: Secondary | ICD-10-CM | POA: Diagnosis present

## 2023-03-31 DIAGNOSIS — Z885 Allergy status to narcotic agent status: Secondary | ICD-10-CM

## 2023-03-31 DIAGNOSIS — Z7982 Long term (current) use of aspirin: Secondary | ICD-10-CM

## 2023-03-31 DIAGNOSIS — K429 Umbilical hernia without obstruction or gangrene: Secondary | ICD-10-CM | POA: Diagnosis not present

## 2023-03-31 DIAGNOSIS — R1031 Right lower quadrant pain: Secondary | ICD-10-CM | POA: Diagnosis not present

## 2023-03-31 HISTORY — DX: Atherosclerosis of aorta: I70.0

## 2023-03-31 HISTORY — DX: Aphasia: R47.01

## 2023-03-31 HISTORY — DX: Personal history of transient ischemic attack (TIA), and cerebral infarction without residual deficits: Z86.73

## 2023-03-31 HISTORY — DX: Chronic systolic (congestive) heart failure: I50.22

## 2023-03-31 HISTORY — DX: Supraventricular tachycardia, unspecified: I47.10

## 2023-03-31 HISTORY — DX: Solitary pulmonary nodule: R91.1

## 2023-03-31 HISTORY — DX: Type 2 diabetes mellitus without complications: E11.9

## 2023-03-31 HISTORY — DX: Cardiomyopathy, unspecified: I42.9

## 2023-03-31 LAB — COMPREHENSIVE METABOLIC PANEL
ALT: 35 U/L (ref 0–44)
AST: 52 U/L — ABNORMAL HIGH (ref 15–41)
Albumin: 4.1 g/dL (ref 3.5–5.0)
Alkaline Phosphatase: 61 U/L (ref 38–126)
BUN: 73 mg/dL — ABNORMAL HIGH (ref 6–20)
CO2: <7 mmol/L — ABNORMAL LOW (ref 22–32)
Calcium: 10 mg/dL (ref 8.9–10.3)
Chloride: 88 mmol/L — ABNORMAL LOW (ref 98–111)
Creatinine, Ser: 7.31 mg/dL — ABNORMAL HIGH (ref 0.44–1.00)
GFR, Estimated: 6 mL/min — ABNORMAL LOW (ref >60)
Glucose, Bld: 152 mg/dL — ABNORMAL HIGH (ref 70–99)
Potassium: 4.8 mmol/L (ref 3.5–5.1)
Sodium: 136 mmol/L (ref 135–145)
Total Bilirubin: 1.2 mg/dL (ref 0.3–1.2)
Total Protein: 8.5 g/dL — ABNORMAL HIGH (ref 6.5–8.1)

## 2023-03-31 LAB — CBC
HCT: 45.4 % (ref 36.0–46.0)
Hemoglobin: 14.4 g/dL (ref 12.0–15.0)
MCH: 28.5 pg (ref 26.0–34.0)
MCHC: 31.7 g/dL (ref 30.0–36.0)
MCV: 89.9 fL (ref 80.0–100.0)
Platelets: 303 10*3/uL (ref 150–400)
RBC: 5.05 MIL/uL (ref 3.87–5.11)
RDW: 13.1 % (ref 11.5–15.5)
WBC: 17.8 10*3/uL — ABNORMAL HIGH (ref 4.0–10.5)
nRBC: 0 % (ref 0.0–0.2)

## 2023-03-31 LAB — BASIC METABOLIC PANEL
Anion gap: 38 — ABNORMAL HIGH (ref 5–15)
Anion gap: 27 — ABNORMAL HIGH (ref 5–15)
BUN: 66 mg/dL — ABNORMAL HIGH (ref 6–20)
BUN: 66 mg/dL — ABNORMAL HIGH (ref 6–20)
BUN: 49 mg/dL — ABNORMAL HIGH (ref 6–20)
CO2: 7 mmol/L — ABNORMAL LOW (ref 22–32)
CO2: <7 mmol/L — ABNORMAL LOW (ref 22–32)
CO2: 16 mmol/L — ABNORMAL LOW (ref 22–32)
Calcium: 8.3 mg/dL — ABNORMAL LOW (ref 8.9–10.3)
Calcium: 8.2 mg/dL — ABNORMAL LOW (ref 8.9–10.3)
Calcium: 7.7 mg/dL — ABNORMAL LOW (ref 8.9–10.3)
Chloride: 96 mmol/L — ABNORMAL LOW (ref 98–111)
Chloride: 96 mmol/L — ABNORMAL LOW (ref 98–111)
Chloride: 99 mmol/L (ref 98–111)
Creatinine, Ser: 6.47 mg/dL — ABNORMAL HIGH (ref 0.44–1.00)
Creatinine, Ser: 6.42 mg/dL — ABNORMAL HIGH (ref 0.44–1.00)
Creatinine, Ser: 4.39 mg/dL — ABNORMAL HIGH (ref 0.44–1.00)
GFR, Estimated: 7 mL/min — ABNORMAL LOW (ref >60)
GFR, Estimated: 7 mL/min — ABNORMAL LOW (ref >60)
GFR, Estimated: 11 mL/min — ABNORMAL LOW (ref >60)
Glucose, Bld: 115 mg/dL — ABNORMAL HIGH (ref 70–99)
Glucose, Bld: 115 mg/dL — ABNORMAL HIGH (ref 70–99)
Glucose, Bld: 103 mg/dL — ABNORMAL HIGH (ref 70–99)
Potassium: 4.6 mmol/L (ref 3.5–5.1)
Potassium: 4.4 mmol/L (ref 3.5–5.1)
Potassium: 3.8 mmol/L (ref 3.5–5.1)
Sodium: 141 mmol/L (ref 135–145)
Sodium: 140 mmol/L (ref 135–145)
Sodium: 142 mmol/L (ref 135–145)

## 2023-03-31 LAB — URINALYSIS, ROUTINE W REFLEX MICROSCOPIC
Bilirubin Urine: NEGATIVE
Glucose, UA: 50 mg/dL — AB
Ketones, ur: 5 mg/dL — AB
Nitrite: NEGATIVE
Protein, ur: >=300 mg/dL — AB
Specific Gravity, Urine: 1.017 (ref 1.005–1.030)
pH: 5 (ref 5.0–8.0)

## 2023-03-31 LAB — BLOOD GAS, VENOUS
Acid-base deficit: 23.4 mmol/L — ABNORMAL HIGH (ref 0.0–2.0)
Acid-base deficit: 21 mmol/L — ABNORMAL HIGH (ref 0.0–2.0)
Acid-base deficit: 14.8 mmol/L — ABNORMAL HIGH (ref 0.0–2.0)
Bicarbonate: 7 mmol/L — ABNORMAL LOW (ref 20.0–28.0)
Bicarbonate: 7.8 mmol/L — ABNORMAL LOW (ref 20.0–28.0)
Bicarbonate: 10.1 mmol/L — ABNORMAL LOW (ref 20.0–28.0)
O2 Saturation: 47.2 %
O2 Saturation: 50.9 %
O2 Saturation: 84 %
Patient temperature: 37
Patient temperature: 37
Patient temperature: 37
pCO2, Ven: 29 mmHg — ABNORMAL LOW (ref 44–60)
pCO2, Ven: 27 mmHg — ABNORMAL LOW (ref 44–60)
pCO2, Ven: 22 mmHg — ABNORMAL LOW (ref 44–60)
pH, Ven: 6.99 — CL (ref 7.25–7.43)
pH, Ven: 7.07 — CL (ref 7.25–7.43)
pH, Ven: 7.27 (ref 7.25–7.43)
pO2, Ven: 33 mmHg (ref 32–45)
pO2, Ven: 32 mmHg (ref 32–45)
pO2, Ven: 50 mmHg — ABNORMAL HIGH (ref 32–45)

## 2023-03-31 LAB — GLUCOSE, CAPILLARY
Glucose-Capillary: 111 mg/dL — ABNORMAL HIGH (ref 70–99)
Glucose-Capillary: 122 mg/dL — ABNORMAL HIGH (ref 70–99)
Glucose-Capillary: 64 mg/dL — ABNORMAL LOW (ref 70–99)
Glucose-Capillary: 117 mg/dL — ABNORMAL HIGH (ref 70–99)
Glucose-Capillary: 68 mg/dL — ABNORMAL LOW (ref 70–99)
Glucose-Capillary: 80 mg/dL (ref 70–99)
Glucose-Capillary: 117 mg/dL — ABNORMAL HIGH (ref 70–99)
Glucose-Capillary: 106 mg/dL — ABNORMAL HIGH (ref 70–99)
Glucose-Capillary: 110 mg/dL — ABNORMAL HIGH (ref 70–99)
Glucose-Capillary: 101 mg/dL — ABNORMAL HIGH (ref 70–99)

## 2023-03-31 LAB — URINE DRUG SCREEN, QUALITATIVE (ARMC ONLY)
Amphetamines, Ur Screen: NOT DETECTED
Barbiturates, Ur Screen: NOT DETECTED
Benzodiazepine, Ur Scrn: NOT DETECTED
Cannabinoid 50 Ng, Ur ~~LOC~~: NOT DETECTED
Cocaine Metabolite,Ur ~~LOC~~: NOT DETECTED
MDMA (Ecstasy)Ur Screen: NOT DETECTED
Methadone Scn, Ur: NOT DETECTED
Opiate, Ur Screen: NOT DETECTED
Phencyclidine (PCP) Ur S: NOT DETECTED
Tricyclic, Ur Screen: NOT DETECTED

## 2023-03-31 LAB — CBG MONITORING, ED
Glucose-Capillary: 98 mg/dL (ref 70–99)
Glucose-Capillary: 138 mg/dL — ABNORMAL HIGH (ref 70–99)
Glucose-Capillary: 133 mg/dL — ABNORMAL HIGH (ref 70–99)

## 2023-03-31 LAB — CBC WITH DIFFERENTIAL/PLATELET
Abs Immature Granulocytes: 0.1 10*3/uL — ABNORMAL HIGH (ref 0.00–0.07)
Basophils Absolute: 0.1 10*3/uL (ref 0.0–0.1)
Basophils Relative: 0 %
Eosinophils Absolute: 0 10*3/uL (ref 0.0–0.5)
Eosinophils Relative: 0 %
HCT: 57.9 % — ABNORMAL HIGH (ref 36.0–46.0)
Hemoglobin: 17.3 g/dL — ABNORMAL HIGH (ref 12.0–15.0)
Immature Granulocytes: 1 %
Lymphocytes Relative: 7 %
Lymphs Abs: 1 10*3/uL (ref 0.7–4.0)
MCH: 28.2 pg (ref 26.0–34.0)
MCHC: 29.9 g/dL — ABNORMAL LOW (ref 30.0–36.0)
MCV: 94.3 fL (ref 80.0–100.0)
Monocytes Absolute: 0.3 10*3/uL (ref 0.1–1.0)
Monocytes Relative: 2 %
Neutro Abs: 13.5 10*3/uL — ABNORMAL HIGH (ref 1.7–7.7)
Neutrophils Relative %: 90 %
Platelets: 316 10*3/uL (ref 150–400)
RBC: 6.14 MIL/uL — ABNORMAL HIGH (ref 3.87–5.11)
RDW: 12.9 % (ref 11.5–15.5)
WBC: 15 10*3/uL — ABNORMAL HIGH (ref 4.0–10.5)
nRBC: 0 % (ref 0.0–0.2)

## 2023-03-31 LAB — HEMOGLOBIN A1C
Hgb A1c MFr Bld: 8.2 % — ABNORMAL HIGH (ref 4.8–5.6)
Mean Plasma Glucose: 188.64 mg/dL

## 2023-03-31 LAB — RESP PANEL BY RT-PCR (RSV, FLU A&B, COVID)  RVPGX2
Influenza A by PCR: NEGATIVE
Influenza B by PCR: NEGATIVE
Resp Syncytial Virus by PCR: NEGATIVE
SARS Coronavirus 2 by RT PCR: NEGATIVE

## 2023-03-31 LAB — BETA-HYDROXYBUTYRIC ACID
Beta-Hydroxybutyric Acid: >8 mmol/L — ABNORMAL HIGH (ref 0.05–0.27)
Beta-Hydroxybutyric Acid: 7.42 mmol/L — ABNORMAL HIGH (ref 0.05–0.27)
Beta-Hydroxybutyric Acid: 6.55 mmol/L — ABNORMAL HIGH (ref 0.05–0.27)

## 2023-03-31 LAB — PROCALCITONIN: Procalcitonin: 0.3 ng/mL

## 2023-03-31 LAB — LITHIUM LEVEL: Lithium Lvl: <0.06 mmol/L — ABNORMAL LOW (ref 0.60–1.20)

## 2023-03-31 LAB — MRSA NEXT GEN BY PCR, NASAL: MRSA by PCR Next Gen: DETECTED — AB

## 2023-03-31 LAB — MAGNESIUM
Magnesium: 1.1 mg/dL — ABNORMAL LOW (ref 1.7–2.4)
Magnesium: 1.1 mg/dL — ABNORMAL LOW (ref 1.7–2.4)

## 2023-03-31 LAB — LACTIC ACID, PLASMA
Lactic Acid, Venous: >9 mmol/L (ref 0.5–1.9)
Lactic Acid, Venous: >9 mmol/L (ref 0.5–1.9)
Lactic Acid, Venous: >9 mmol/L (ref 0.5–1.9)
Lactic Acid, Venous: >9 mmol/L (ref 0.5–1.9)

## 2023-03-31 LAB — PHOSPHORUS: Phosphorus: 7.8 mg/dL — ABNORMAL HIGH (ref 2.5–4.6)

## 2023-03-31 LAB — TROPONIN I (HIGH SENSITIVITY)
Troponin I (High Sensitivity): 213 ng/L (ref <18)
Troponin I (High Sensitivity): 295 ng/L (ref <18)
Troponin I (High Sensitivity): 495 ng/L (ref <18)

## 2023-03-31 LAB — AMYLASE: Amylase: 411 U/L — ABNORMAL HIGH (ref 28–100)

## 2023-03-31 LAB — LIPASE, BLOOD: Lipase: 240 U/L — ABNORMAL HIGH (ref 11–51)

## 2023-03-31 LAB — HIV ANTIBODY (ROUTINE TESTING W REFLEX): HIV Screen 4th Generation wRfx: NONREACTIVE

## 2023-03-31 LAB — ACETAMINOPHEN LEVEL: Acetaminophen (Tylenol), Serum: <10 ug/mL — ABNORMAL LOW (ref 10–30)

## 2023-03-31 LAB — TSH: TSH: 0.924 u[IU]/mL (ref 0.350–4.500)

## 2023-03-31 LAB — SALICYLATE LEVEL: Salicylate Lvl: <7 mg/dL — ABNORMAL LOW (ref 7.0–30.0)

## 2023-03-31 MED ORDER — MAGNESIUM SULFATE 2 GM/50ML IV SOLN
2.0000 g | Freq: Once | INTRAVENOUS | Status: AC
  Administered 2023-03-31: 2 g via INTRAVENOUS
  Filled 2023-03-31: qty 50

## 2023-03-31 MED ORDER — PRISMASOL BGK 4/2.5 32-4-2.5 MEQ/L REPLACEMENT SOLN
Status: DC
  Filled 2023-03-31: qty 5000

## 2023-03-31 MED ORDER — LACTATED RINGERS IV BOLUS
1000.0000 mL | Freq: Once | INTRAVENOUS | Status: AC
  Administered 2023-03-31: 1000 mL via INTRAVENOUS

## 2023-03-31 MED ORDER — METRONIDAZOLE 500 MG/100ML IV SOLN
500.0000 mg | Freq: Once | INTRAVENOUS | Status: AC
  Administered 2023-03-31: 500 mg via INTRAVENOUS
  Filled 2023-03-31: qty 100

## 2023-03-31 MED ORDER — SODIUM BICARBONATE 8.4 % IV SOLN
INTRAVENOUS | Status: DC
  Filled 2023-03-31: qty 1000

## 2023-03-31 MED ORDER — MAGNESIUM SULFATE 4 GM/100ML IV SOLN
4.0000 g | Freq: Once | INTRAVENOUS | Status: AC
  Administered 2023-03-31: 4 g via INTRAVENOUS
  Filled 2023-03-31: qty 100

## 2023-03-31 MED ORDER — LACTATED RINGERS IV SOLN: INTRAVENOUS | Status: DC

## 2023-03-31 MED ORDER — METRONIDAZOLE 500 MG/100ML IV SOLN
500.0000 mg | Freq: Two times a day (BID) | INTRAVENOUS | Status: DC
  Administered 2023-04-01: 500 mg via INTRAVENOUS
  Filled 2023-03-31: qty 100

## 2023-03-31 MED ORDER — POLYETHYLENE GLYCOL 3350 17 G PO PACK: 17.0000 g | PACK | Freq: Every day | ORAL | Status: DC | PRN

## 2023-03-31 MED ORDER — SODIUM CHLORIDE 0.9 % IV SOLN: 250.0000 mL | INTRAVENOUS | Status: DC

## 2023-03-31 MED ORDER — SODIUM CHLORIDE 0.9 % IV BOLUS
1000.0000 mL | Freq: Once | INTRAVENOUS | Status: AC
  Administered 2023-03-31: 1000 mL via INTRAVENOUS

## 2023-03-31 MED ORDER — SODIUM CHLORIDE 0.9 % IV SOLN: 1.0000 g | INTRAVENOUS | Status: DC

## 2023-03-31 MED ORDER — CHLORHEXIDINE GLUCONATE CLOTH 2 % EX PADS
6.0000 | MEDICATED_PAD | Freq: Every day | CUTANEOUS | Status: DC
  Administered 2023-03-31 – 2023-04-01 (×2): 6 via TOPICAL

## 2023-03-31 MED ORDER — SODIUM BICARBONATE 8.4 % IV SOLN
100.0000 meq | Freq: Once | INTRAVENOUS | Status: AC
  Administered 2023-03-31: 100 meq via INTRAVENOUS
  Filled 2023-03-31: qty 100

## 2023-03-31 MED ORDER — DEXTROSE 50 % IV SOLN
INTRAVENOUS | Status: AC
  Filled 2023-03-31: qty 50

## 2023-03-31 MED ORDER — INSULIN REGULAR(HUMAN) IN NACL 100-0.9 UT/100ML-% IV SOLN
INTRAVENOUS | Status: DC
  Filled 2023-03-31: qty 100

## 2023-03-31 MED ORDER — DEXTROSE 50 % IV SOLN
0.0000 mL | INTRAVENOUS | Status: DC | PRN
  Administered 2023-03-31: 50 mL via INTRAVENOUS
  Filled 2023-03-31: qty 50

## 2023-03-31 MED ORDER — VANCOMYCIN VARIABLE DOSE PER UNSTABLE RENAL FUNCTION (PHARMACIST DOSING): Status: DC

## 2023-03-31 MED ORDER — HEPARIN SODIUM (PORCINE) 5000 UNIT/ML IJ SOLN
5000.0000 [IU] | Freq: Three times a day (TID) | INTRAMUSCULAR | Status: DC
  Administered 2023-03-31 – 2023-04-05 (×17): 5000 [IU] via SUBCUTANEOUS
  Filled 2023-03-31 (×17): qty 1

## 2023-03-31 MED ORDER — PHENYLEPHRINE HCL-NACL 20-0.9 MG/250ML-% IV SOLN
25.0000 ug/min | INTRAVENOUS | Status: DC
  Administered 2023-04-01: 25 ug/min via INTRAVENOUS
  Filled 2023-03-31: qty 250

## 2023-03-31 MED ORDER — ONDANSETRON HCL 4 MG/2ML IJ SOLN
4.0000 mg | Freq: Four times a day (QID) | INTRAMUSCULAR | Status: DC | PRN
  Administered 2023-03-31: 4 mg via INTRAVENOUS
  Filled 2023-03-31: qty 2

## 2023-03-31 MED ORDER — NOREPINEPHRINE 4 MG/250ML-% IV SOLN: 2.0000 ug/min | INTRAVENOUS | Status: DC

## 2023-03-31 MED ORDER — SODIUM CHLORIDE 0.9 % IV SOLN
250.0000 mL | INTRAVENOUS | Status: DC
  Administered 2023-03-31: 250 mL via INTRAVENOUS
  Administered 2023-04-06: 1000 mL via INTRAVENOUS

## 2023-03-31 MED ORDER — DEXTROSE 50 % IV SOLN
1.0000 | Freq: Once | INTRAVENOUS | Status: AC
  Administered 2023-03-31: 50 mL via INTRAVENOUS

## 2023-03-31 MED ORDER — POTASSIUM CHLORIDE 10 MEQ/100ML IV SOLN
10.0000 meq | INTRAVENOUS | Status: AC
  Filled 2023-03-31: qty 100

## 2023-03-31 MED ORDER — CALCIUM GLUCONATE-NACL 2-0.675 GM/100ML-% IV SOLN
2.0000 g | Freq: Once | INTRAVENOUS | Status: AC
  Administered 2023-04-01: 2000 mg via INTRAVENOUS
  Filled 2023-03-31: qty 100

## 2023-03-31 MED ORDER — PRISMASOL BGK 4/2.5 32-4-2.5 MEQ/L EC SOLN: Status: DC

## 2023-03-31 MED ORDER — INSULIN REGULAR(HUMAN) IN NACL 100-0.9 UT/100ML-% IV SOLN
INTRAVENOUS | Status: DC
  Administered 2023-03-31: 1.9 [IU]/h via INTRAVENOUS
  Administered 2023-03-31: 0.4 [IU]/h via INTRAVENOUS
  Filled 2023-03-31: qty 100

## 2023-03-31 MED ORDER — DEXTROSE IN LACTATED RINGERS 5 % IV SOLN: INTRAVENOUS | Status: DC

## 2023-03-31 MED ORDER — VANCOMYCIN HCL 1750 MG/350ML IV SOLN
1750.0000 mg | Freq: Once | INTRAVENOUS | Status: AC
  Administered 2023-03-31: 1750 mg via INTRAVENOUS
  Filled 2023-03-31 (×2): qty 350

## 2023-03-31 MED ORDER — HEPARIN SODIUM (PORCINE) 1000 UNIT/ML IJ SOLN
1000.0000 [IU] | INTRAMUSCULAR | Status: DC | PRN
  Administered 2023-03-31: 2800 [IU]
  Administered 2023-04-01 (×2): 1400 [IU]
  Administered 2023-04-02: 3000 [IU]
  Filled 2023-03-31: qty 6
  Filled 2023-03-31: qty 4

## 2023-03-31 MED ORDER — SODIUM BICARBONATE 8.4 % IV SOLN
INTRAVENOUS | Status: DC
  Filled 2023-03-31 (×2): qty 1000
  Filled 2023-03-31: qty 150
  Filled 2023-03-31 (×2): qty 1000

## 2023-03-31 MED ORDER — SODIUM BICARBONATE 8.4 % IV SOLN
150.0000 meq | Freq: Once | INTRAVENOUS | Status: AC
  Administered 2023-03-31: 150 meq via INTRAVENOUS
  Filled 2023-03-31: qty 150

## 2023-03-31 MED ORDER — SODIUM BICARBONATE 8.4 % IV SOLN
50.0000 meq | Freq: Once | INTRAVENOUS | Status: AC
  Administered 2023-03-31: 50 meq via INTRAVENOUS
  Filled 2023-03-31: qty 50

## 2023-03-31 MED ORDER — FAMOTIDINE IN NACL 20-0.9 MG/50ML-% IV SOLN
20.0000 mg | INTRAVENOUS | Status: DC
  Administered 2023-03-31 – 2023-04-02 (×3): 20 mg via INTRAVENOUS
  Filled 2023-03-31 (×3): qty 50

## 2023-03-31 MED ORDER — SODIUM BICARBONATE 8.4 % IV SOLN: 50.0000 meq | Freq: Once | INTRAVENOUS | Status: DC

## 2023-03-31 MED ORDER — SODIUM CHLORIDE 0.9 % IV SOLN
2.0000 g | Freq: Once | INTRAVENOUS | Status: AC
  Administered 2023-03-31: 2 g via INTRAVENOUS
  Filled 2023-03-31: qty 12.5

## 2023-03-31 MED ORDER — MUPIROCIN 2 % EX OINT
1.0000 | TOPICAL_OINTMENT | Freq: Two times a day (BID) | CUTANEOUS | Status: AC
  Administered 2023-03-31 – 2023-04-05 (×9): 1 via NASAL
  Filled 2023-03-31 (×2): qty 22

## 2023-03-31 MED ORDER — DOCUSATE SODIUM 100 MG PO CAPS: 100.0000 mg | ORAL_CAPSULE | Freq: Two times a day (BID) | ORAL | Status: DC | PRN

## 2023-03-31 MED ORDER — FENTANYL CITRATE PF 50 MCG/ML IJ SOSY
12.5000 ug | PREFILLED_SYRINGE | Freq: Once | INTRAMUSCULAR | Status: AC
  Administered 2023-03-31: 12.5 ug via INTRAVENOUS
  Filled 2023-03-31: qty 1

## 2023-03-31 MED ORDER — DEXTROSE 50 % IV SOLN: 0.0000 mL | INTRAVENOUS | Status: DC | PRN

## 2023-03-31 NOTE — Consult Note (Signed)
CODE SEPSIS - PHARMACY COMMUNICATION  **Broad Spectrum Antibiotics should be administered within 1 hour of Sepsis diagnosis**  Time Code Sepsis Called/Page Received: 1241  Antibiotics Ordered: vancomycin, cefepime, flagyl  Time of 1st antibiotic administration: 1650  Additional action taken by pharmacy: reached out to nursing staff, stated that patient was a difficult stick and during this time, was transferred to ICU w/o antibiotics being given.  If necessary, Name of Provider/Nurse Contacted: Derrel Nip ,PharmD Clinical Pharmacist  03/31/2023  1:16 PM

## 2023-03-31 NOTE — Procedures (Signed)
Central Venous Catheter Insertion Procedure Note  Tyrica Stetser  [id number]  02/22/1966  Date:03/31/23  Time:8:43 PM   Provider Performing:Gabriel Conry L [place]   Procedure: Insertion of Non-tunneled Central Venous Catheter(36556)with US guidance (62694)    Indication(s) Hemodialysis  Consent Risks of the procedure as well as the alternatives and risks of each were explained to the patient and/or caregiver.  Consent for the procedure was obtained and is signed in the bedside chart  Anesthesia Topical only with 1% lidocaine  & fentanyl IVP  Timeout Verified patient identification, verified procedure, site/side was marked, verified correct patient position, special equipment/implants available, medications/allergies/relevant history reviewed, required imaging and test results available.  Sterile Technique Maximal sterile technique including full sterile barrier drape, hand hygiene, sterile gown, sterile gloves, mask, hair covering, sterile ultrasound probe cover (if used).  Procedure Description Area of catheter insertion was cleaned with chlorhexidine and draped in sterile fashion.   With real-time ultrasound guidance a HD catheter was placed into the left femoral vein.  Nonpulsatile blood flow and easy flushing noted in all ports.  The catheter was sutured in place and sterile dressing applied.  Complications/Tolerance None; patient tolerated the procedure well. Chest X-ray is ordered to verify placement for internal jugular or subclavian cannulation.  Chest x-ray is not ordered for femoral cannulation.  EBL Minimal  Specimen(s) None  Betsey Holiday, AGACNP-BC Acute Care Nurse Practitioner [facility]   [phone] / [phone] Please see Amion for details.

## 2023-03-31 NOTE — Progress Notes (Signed)
PHARMACY CONSULT NOTE  Pharmacy Consult for Electrolyte Monitoring and Replacement   Recent Labs: Potassium (mmol/L)  Date Value  03/31/2023 3.8   Magnesium (mg/dL)  Date Value  78/29/5621 1.1 (L)   Calcium (mg/dL)  Date Value  30/86/5784 7.7 (L)   Albumin (g/dL)  Date Value  69/62/9528 4.1   Phosphorus (mg/dL)  Date Value  41/32/4401 7.8 (H)   Sodium (mmol/L)  Date Value  03/31/2023 142    Assessment: Sarah Lane is a 57 y.o. female with PMH including DM, seizures, HTN, dysrhythmia, ovarian cancer admitted on 03/31/2023 with  euglycemic DKA, sepsis secondary to acute pancreatitis, severe lactic acidosis, AKI, now on CRRT. Pharmacy consulted to assist with electrolyte monitoring and replacement as indicated.  Goal of Therapy:  Electrolytes within normal limits  Plan:  Mag 1.1, Mag 6 gm ordered by attending, will recheck w/ AM labs Ca 7.7, Calcium Gluconate 2 gm ordered, will recheck w/ AM labs  --Pharmacy will continue to follow along  Otelia Sergeant, PharmD, [facility] 03/31/2023 11:53 PM

## 2023-03-31 NOTE — Consult Note (Signed)
Pharmacy Antibiotic Note  Sarah Lane is a 57 y.o. female with PMH including DM, seizures, HTN, dysrhythmia, ovarian cancer admitted on 03/31/2023 with  euglycemic DKA, sepsis secondary to acute pancreatitis, severe lactic acidosis, AKI .  Pharmacy has been consulted for vancomycin and cefepime dosing.  Plan:  Cefepime 1 g IV q24h  Vancomycin 1.75 g (21 mg/kg) IV LD followed by dose per levels given un-stable renal function --Will assess in AM for starting maintenance regimen versus timing of level --Daily Scr per protocol  Height: 5\' 1"  (154.9 cm) Weight: 81 kg (178 lb 9.2 oz) IBW/kg (Calculated) : 47.8  Temp (24hrs), Avg:97.7 F (36.5 C), Min:97.5 F (36.4 C), Max:97.8 F (36.6 C)  Recent Labs  Lab 03/31/23 1005 03/31/23 1103 03/31/23 1150 03/31/23 1449 03/31/23 1451 03/31/23 1751  WBC 15.0*  --   --   --  17.8*  --   CREATININE  --  7.31*  --  6.47*  --   --   LATICACIDVEN  --   --  >9.0* >9.0*  --  >9.0*    Estimated Creatinine Clearance: 9.1 mL/min (A) (by C-G formula based on SCr of 6.47 mg/dL (H)).    Allergies  Allergen Reactions   Codeine Nausea And Vomiting   Penicillins     Family history of allergry.     Ibuprofen Nausea And Vomiting    Antimicrobials this admission: Metronidazole 8/21 x 1 Cefepime 8/21 >>  Vancomycin 8/21 >>   Dose adjustments this admission: N/A  Microbiology results: 8/21 BCx: pending 8/21 MRSA PCR: (+)  Thank you for allowing pharmacy to be a part of this patient's care.  Tressie Ellis 03/31/2023 6:43 PM

## 2023-03-31 NOTE — ED Triage Notes (Signed)
Type II DM that has been nauseated since last night and unable to tolerate PO, however, she was able to tolerate her Metformin and took her insulin; Upon EMS arrival, her CBG was in the 30s, she received D10 250 ml which brought her CBG up to 86; CBG here is 98; NVD that began yesterday; States that she is living in her friends house and someone there was recently diagnosed or just got over COVID   [facility] [phone] for ride home**

## 2023-03-31 NOTE — Consult Note (Signed)
PHARMACY -  BRIEF ANTIBIOTIC NOTE   Pharmacy has received consult(s) for Vancomycin and Cefepime from an ED provider.  The patient's profile has been reviewed for ht/wt/allergies/indication/available labs.    One time order(s) placed for Vancomycin 1750mg  IV and Cefepime 2g IV.  Further antibiotics/pharmacy consults should be ordered by admitting physician if indicated.                       Thank you, Bettey Costa 03/31/2023  12:45 PM

## 2023-03-31 NOTE — Progress Notes (Signed)
[facility] is following code sepsis 

## 2023-03-31 NOTE — ED Provider Notes (Signed)
[facility] Provider Note    Event Date/Time   First MD Initiated Contact with Patient 03/31/23 1049     (approximate)   History   Hypoglycemia (Type II DM that has been nauseated since last night and unable to tolerate PO, however, she was able to tolerate her Metformin and took her insulin; Upon EMS arrival, her CBG was in the 30s, she received D10 250 ml which brought her CBG up to 86; CBG here is 98) and Nausea (NVD that began yesterday; States that she is living in her friends house and someone there was recently diagnosed or just got Lane COVID)   HPI  Sarah Lane is a 57 y.o. female  here with altered mental status, weakness. Pt reports that she has had worsening n/v/d x "a few days," and has been unable to keep anything down. She has had associated dry mouth, fatigue, and now SOB. She has continued her medications. Reports that her sugars had been running high but now are low. EMS called to scene 2/2 weakness, SOB. No known fevers. No chest pain. Remainder limited 2/2 confusion/acuity.       Physical Exam   Triage Vital Signs: ED Triage Vitals  Encounter Vitals Group     BP 03/31/23 0946 126/77     Systolic BP Percentile --      Diastolic BP Percentile --      Pulse Rate 03/31/23 0946 (!) 115     Resp 03/31/23 0946 (!) 26     Temp 03/31/23 0946 97.8 F (36.6 C)     Temp Source 03/31/23 0946 Oral     SpO2 03/31/23 0946 99 %     Weight 03/31/23 0944 176 lb (79.8 kg)     Height 03/31/23 0944 5\' 1"  (1.549 m)     Head Circumference --      Peak Flow --      Pain Score 03/31/23 0944 0     Pain Loc --      Pain Education --      Exclude from Growth Chart --     Most recent vital signs: Vitals:   03/31/23 1344 03/31/23 1410  BP:  113/70  Pulse: (!) 163 (!) 163  Resp: 20 (!) 23  Temp: 97.6 F (36.4 C) 97.7 F (36.5 C)  SpO2: 100% 100%     General: Awake, mild resp distress with tachypnea, speaking in short sentences. CV:  Good  peripheral perfusion. Tachycardia. Resp:  Normal work of breathing. Tachypnea, but lungs overall clear bilaterally. Abd:  No distention. Minimal diffuse TTP, no rebound or guarding. Other:  Markedly dry MM. Oriented to person, place, situation but not time. No focal deficits.   ED Results / Procedures / Treatments   Labs (all labs ordered are listed, but only abnormal results are displayed) Labs Reviewed  CBC WITH DIFFERENTIAL/PLATELET - Abnormal; Notable for the following components:      Result Value   WBC 15.0 (*)    RBC 6.14 (*)    Hemoglobin 17.3 (*)    HCT 57.9 (*)    MCHC 29.9 (*)    Neutro Abs 13.5 (*)    Abs Immature Granulocytes 0.10 (*)    All other components within normal limits  URINALYSIS, ROUTINE W REFLEX MICROSCOPIC - Abnormal; Notable for the following components:   Color, Urine YELLOW (*)    APPearance CLOUDY (*)    Glucose, UA 50 (*)    Hgb urine dipstick MODERATE (*)  Ketones, ur 5 (*)    Protein, ur >=300 (*)    Leukocytes,Ua SMALL (*)    Bacteria, UA RARE (*)    All other components within normal limits  COMPREHENSIVE METABOLIC PANEL - Abnormal; Notable for the following components:   Chloride 88 (*)    CO2 <7 (*)    Glucose, Bld 152 (*)    BUN 73 (*)    Creatinine, Ser 7.31 (*)    Total Protein 8.5 (*)    AST 52 (*)    GFR, Estimated 6 (*)    All other components within normal limits  LIPASE, BLOOD - Abnormal; Notable for the following components:   Lipase 240 (*)    All other components within normal limits  BLOOD GAS, VENOUS - Abnormal; Notable for the following components:   pH, Ven 6.99 (*)    pCO2, Ven 29 (*)    Bicarbonate 7.0 (*)    Acid-base deficit 23.4 (*)    All other components within normal limits  BETA-HYDROXYBUTYRIC ACID - Abnormal; Notable for the following components:   Beta-Hydroxybutyric Acid >8.00 (*)    All other components within normal limits  LACTIC ACID, PLASMA - Abnormal; Notable for the following components:    Lactic Acid, Venous >9.0 (*)    All other components within normal limits  CBG MONITORING, ED - Abnormal; Notable for the following components:   Glucose-Capillary 138 (*)    All other components within normal limits  CBG MONITORING, ED - Abnormal; Notable for the following components:   Glucose-Capillary 133 (*)    All other components within normal limits  RESP PANEL BY RT-PCR (RSV, FLU A&B, COVID)  [id number]  CULTURE, BLOOD (ROUTINE X 2)  CULTURE, BLOOD (ROUTINE X 2)  MRSA NEXT GEN BY PCR, NASAL  LACTIC ACID, PLASMA  HIV ANTIBODY (ROUTINE TESTING W REFLEX)  CBC  CREATININE, SERUM  AMYLASE  PROCALCITONIN  URINALYSIS, W/ REFLEX TO CULTURE (INFECTION SUSPECTED)  BASIC METABOLIC PANEL  BETA-HYDROXYBUTYRIC ACID  BLOOD GAS, VENOUS  CBG MONITORING, ED  TROPONIN I (HIGH SENSITIVITY)     EKG Sinus tachycardia, VR 160. QRS 110, QTc 536. No acute ST elevations. Nonspecific ST changes.   RADIOLOGY CXR: Clear CT Head: Pending CT A/P: Pending   I also independently reviewed and agree with radiologist interpretations.   PROCEDURES:  Critical Care performed: Yes, see critical care procedure note(s)  .Critical Care  Performed by: Shaune Pollack, MD Authorized by: Shaune Pollack, MD   Critical care provider statement:    Critical care time (minutes):  30   Critical care time was exclusive of:  Separately billable procedures and treating other patients   Critical care was necessary to treat or prevent imminent or life-threatening deterioration of the following conditions:  Cardiac failure, circulatory failure, respiratory failure and metabolic crisis   Critical care was time spent personally by me on the following activities:  Development of treatment plan with patient or surrogate, discussions with consultants, evaluation of patient's response to treatment, examination of patient, ordering and review of laboratory studies, ordering and review of radiographic studies, ordering and  performing treatments and interventions, pulse oximetry, re-evaluation of patient's condition and review of old charts     MEDICATIONS ORDERED IN ED: Medications  dextrose 50 % solution 0-50 mL (has no administration in time range)  potassium chloride 10 mEq in 100 mL IVPB (10 mEq Intravenous Not Given 03/31/23 1335)  metroNIDAZOLE (FLAGYL) IVPB 500 mg (has no administration in time range)  vancomycin (  VANCOREADY) IVPB 1750 mg/350 mL (has no administration in time range)  ceFEPIme (MAXIPIME) 2 g in sodium chloride 0.9 % 100 mL IVPB (has no administration in time range)  docusate sodium (COLACE) capsule 100 mg (has no administration in time range)  polyethylene glycol (MIRALAX / GLYCOLAX) packet 17 g (has no administration in time range)  heparin injection 5,000 Units (has no administration in time range)  ondansetron (ZOFRAN) injection 4 mg (has no administration in time range)  famotidine (PEPCID) IVPB 20 mg premix (has no administration in time range)  Chlorhexidine Gluconate Cloth 2 % PADS 6 each (has no administration in time range)  sodium chloride 0.9 % bolus 1,000 mL (0 mLs Intravenous Stopped 03/31/23 1216)  lactated ringers bolus 1,000 mL (0 mLs Intravenous Stopped 03/31/23 1216)  sodium bicarbonate injection 50 mEq (50 mEq Intravenous Given 03/31/23 1216)  sodium chloride 0.9 % bolus 1,000 mL (1,000 mLs Intravenous New Bag/Given 03/31/23 1241)  sodium chloride 0.9 % bolus 1,000 mL (0 mLs Intravenous Stopped 03/31/23 1400)     IMPRESSION / MDM / ASSESSMENT AND PLAN / ED COURSE  I reviewed the triage vital signs and the nursing notes.                              Differential diagnosis includes, but is not limited to, DKA, metabolic acidosis from lactic acidosis, AKI, infection (UTI, intra-abd?), ischemia  Patient's presentation is most consistent with acute presentation with potential threat to life or bodily function.  The patient is on the cardiac monitor to evaluate for  evidence of arrhythmia and/or significant heart rate changes   57 yo F here with weakness, SOB, n/v/d. Pt is tachycardic, hypotensive, and encephalopathic. Lab work reveals profound AG metabolic acidosis, either from lactic acidosis or euglycemic DKA from her diabetic medications. Pt remains tachycardic after 2L IVF. Given her vitals and LA elevation, broad-spectrum ABX also started empirically. CMP shows significant metabolic acidosis and ARF. CO2<7, Cr 7.31. LIpase elevated at 240 - CT scan is pending. LFTs are normal. CXR is clear. No focal neurological deficits.  WIll admit to ICU. They recommend an additional 2L boluses and will evaluate for further management. Pt given IVF, empiric ABX, bicarb x 1, and is mentating slightly better, protecting airway.   FINAL CLINICAL IMPRESSION(S) / ED DIAGNOSES   Final diagnoses:  High anion gap metabolic acidosis  Acute renal failure, unspecified acute renal failure type (HCC)  Acute encephalopathy  Lactic acidosis     Rx / DC Orders   ED Discharge Orders     None        Note:  This document was prepared using Dragon voice recognition software and may include unintentional dictation errors.   Shaune Pollack, MD 03/31/23 [phone]

## 2023-03-31 NOTE — ED Notes (Signed)
Lab called to assist with blood cultures due to hard stick

## 2023-03-31 NOTE — H&P (Signed)
NAME:  Sarah Lane, MRN:  [id number], DOB:  03-06-1966, LOS: 0 ADMISSION DATE:  03/31/2023, CONSULTATION DATE: 03/31/23 REFERRING MD: Dr. Erma Heritage, CHIEF COMPLAINT: N/V   History of Present Illness:  This is a 57 yo female with a PMH of type II diabetes mellitus, seizures, HTN, dysrhythmia, and ovarian cancer.  She presented to [facility] ER on 08/21 via EMS with c/o nausea/vomiting/diarrhea/abdominal pain with inability to tolerate po's.  Onset of symptoms the night of 08/20.  Pt reports her CBG's have been running "High"although she has been taking and tolerating her metformin, jardiance, and insulin.  Upon EMS arrival at pts home she was hypoglycemic with CBG's in the 30's. She received 250 ml bolus of D10W with CBG increasing to 86.  She reports she is living with friends and has had contact with someone recently diagnosed with COVID.    ED Course  Upon arrival to the ER pt confused with weakness, dry mouth, and shortness of breath.  CXR concerning for right lower lobe pulmonary nodule seen on previous CT Chest, but negative for consolidation.  COVID/Influenza A&B/RSV negative.  Significant lab results were: chloride 88/CO2 <7/glucose 152/BUN 73/creatinine 7.31/anion gap not calculated/lipase 240/AST 52/total protein 8.5/lactic acid >9.0/wbc 15.0/beta-hydroxy >8.00.  UA results were: glucose 50/hgb moderate/ketones 5/leukocytes small/protein >= 300/bacteria rare/wbc 21-50.  Pt met sepsis criteria and received: 3L NS bolus/1L LR bolus/1 amp of sodium bicarb.  Cefepime, vancomycin, and metronidazole ordered per EDP.  PCCM team contacted for ICU admission.  CT Abd Pelvis without oral or iv contrast: Suboptimal exam. No acute inflammatory process identified within the abdomen or pelvis. No discrete metastatic disease identified. Multiple other nonacute observations, as described above. Aortic Atherosclerosis (ICD10-I70.0).  CT Head WO Contrast: No acute intracranial abnormality. 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 Mild microvascular ischemic changes.   Pertinent  Medical History  Childhood Asthma  Ovarian Cancer  Type II Diabetes Mellitus  Dysrhythmia  HTN  Seizures   Micro Data:  COVID 08/21>>negative  Influenza A&B 08/21>>negative  RSV 08/21>>negative  Blood x2 08/21>> Urine 08/21>>  Anti-infectives (From admission, onward)    Start     Dose/Rate Route Frequency Ordered Stop   03/31/23 1300  vancomycin (VANCOREADY) IVPB 1750 mg/350 mL        1,750 mg 175 mL/hr over 120 Minutes Intravenous  Once 03/31/23 1245     03/31/23 1300  ceFEPIme (MAXIPIME) 2 g in sodium chloride 0.9 % 100 mL IVPB        2 g 200 mL/hr over 30 Minutes Intravenous  Once 03/31/23 1245     03/31/23 1245  metroNIDAZOLE (FLAGYL) IVPB 500 mg        500 mg 100 mL/hr over 60 Minutes Intravenous  Once 03/31/23 1232        Significant Hospital Events: Including procedures, antibiotic start and stop dates in addition to other pertinent events   08/21: Pt admitted with euglycemic DKA suspect secondary to SGLT 2, acute metabolic encephalopathy, sepsis secondary to acute pancreatitis, severe lactic/metabolic acidosis, and acute kidney injury   Interim History / Subjective:  Pt with c/o nausea and shortness of breath   Objective   Blood pressure 91/75, pulse (!) 163, temperature 97.8 F (36.6 C), temperature source Oral, resp. rate (!) 26, height 5\' 1"  (1.549 m), weight 79.8 kg, SpO2 100%.        Intake/Output Summary (Last 24 hours) at 03/31/2023 1313 Last data filed at 03/31/2023 1216 Gross per 24 hour  Intake 2000 ml  Output --  Net 2000 ml   Filed Weights   03/31/23 0944  Weight: 79.8 kg    Examination: General: Acutely-ill appearing female HENT: Supple, no JVD Lungs: Clear throughout, mildly labored and tachypneic  Cardiovascular: Sinus tachycardia, s1s2, no r/g, 2+ radial/2+ distal pulses, no edema  Abdomen: +BS x4, soft, non distended  Extremities:  Normal bulk and tone, moves all extremities  Neuro: Awake, following commands, PERRLA  GU: Indwelling foley catheter with minimal uop   Resolved Hospital Problem list     Assessment & Plan:   #Acute metabolic encephalopathy  CT Head 08/21: No acute intracranial abnormality. 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 Mild microvascular ischemic changes. - Correct metabolic derangements  - Avoid sedating medication's when able  - Maintain sleep/wake cycle  - Supportive care  - If mentation does not improve following correction of metabolic derangement's will obtain MRI Brain   #Sepsis  #Sinus tachycardia with Qtc prolongation  - Continuous telemetry monitoring  - Follow EKG's  - Will check troponin  - Trend lactic acid until normalized  - Aggressive iv fluid resuscitation  - Maintain map >65  - Avoid Qtc prolongating medications  - Free T3 and TSH results pending   #Acute kidney injury secondary to ATN and SGLT 2 #Severe metabolic and lactic acidosis  - Trend BMP, lactic acid, and vbg  - Replace electrolytes as indicated  - Strict I&O - Avoid nephrotoxic medications - Aggressive iv fluid resuscitation    #Possible acute pancreatitis  #Possible UTI  CT Abd Pelvis WO IV or Oral Contrast 08/21: Suboptimal exam. No acute inflammatory process identified within the abdomen or pelvis.  - Trend WBC and lipase  - Monitor fever curve  - Trend PCT  - Follow cultures  - Continue abx as outlined above   #Elevated AST  - Trend hepatic function panel   #Nausea/vomiting  - Keep NPO for now   #Euglycemic DKA suspect secondary to SGLT2 - Continue insulin gtt until anion gap closed and serum CO2 20 or higher  - CBG's per endo tool  - Diabetes coordinator consulted appreciate input  - Pt should stop taking outpatient jardiance   Best Practice (right click and "Reselect all SmartList Selections" daily)  Diet/type: NPO DVT prophylaxis:  prophylactic heparin  GI prophylaxis: H2B Lines: N/A Foley:  Yes, and it is still needed Code Status:  full code Last date of multidisciplinary goals of care discussion [N/A]  08/21: Updated pt regarding current plan of care and all questions answered  Labs   CBC: Recent Labs  Lab 03/31/23 1005  WBC 15.0*  NEUTROABS 13.5*  HGB 17.3*  HCT 57.9*  MCV 94.3  PLT 316    Basic Metabolic Panel: Recent Labs  Lab 03/31/23 1103  NA 136  K 4.8  CL 88*  CO2 <7*  GLUCOSE 152*  BUN 73*  CREATININE 7.31*  CALCIUM 10.0   GFR: Estimated Creatinine Clearance: 8 mL/min (A) (by C-G formula based on SCr of 7.31 mg/dL (H)). Recent Labs  Lab 03/31/23 1005 03/31/23 1150  WBC 15.0*  --   LATICACIDVEN  --  >9.0*    Liver Function Tests: Recent Labs  Lab 03/31/23 1103  AST 52*  ALT 35  ALKPHOS 61  BILITOT 1.2  PROT 8.5*  ALBUMIN 4.1   Recent Labs  Lab 03/31/23 1103  LIPASE 240*   No results for input(s): "AMMONIA" in the last 168 hours.  ABG  Component Value Date/Time   HCO3 7.0 (L) 03/31/2023 1200   ACIDBASEDEF 23.4 (H) 03/31/2023 1200   O2SAT 47.2 03/31/2023 1200     Coagulation Profile: No results for input(s): "INR", "PROTIME" in the last 168 hours.  Cardiac Enzymes: No results for input(s): "CKTOTAL", "CKMB", "CKMBINDEX", "TROPONINI" in the last 168 hours.  HbA1C: Hgb A1c MFr Bld  Date/Time Value Ref Range Status  12/31/2014 09:08 PM 6.9 (H) 4.8 - 5.6 % Final    Comment:    (NOTE)         Pre-diabetes: 5.7 - 6.4         Diabetes: >6.4         Glycemic control for adults with diabetes: <7.0     CBG: Recent Labs  Lab 03/31/23 0944 03/31/23 1043 03/31/23 1229  GLUCAP 98 138* 133*    Review of Systems: Positives in BOLD   Gen: fever, chills, weight change, fatigue, night sweats HEENT: Denies blurred vision, double vision, hearing loss, tinnitus, sinus congestion, rhinorrhea, sore throat, neck stiffness, dysphagia PULM: shortness of breath,  cough, sputum production, hemoptysis, wheezing CV: Denies chest pain, edema, orthopnea, paroxysmal nocturnal dyspnea, palpitations GI: abdominal pain, nausea, vomiting, diarrhea, hematochezia, melena, constipation, change in bowel habits GU: Denies dysuria, hematuria, polyuria, oliguria, urethral discharge Endocrine: Denies hot or cold intolerance, polyuria, polyphagia or appetite change Derm: Denies rash, dry skin, scaling or peeling skin change Heme: Denies easy bruising, bleeding, bleeding gums Neuro: headache, numbness, weakness, slurred speech, loss of memory or consciousness   Past Medical History:  She,  has a past medical history of Asthma, Cancer (HCC), Diabetes mellitus without complication (HCC), Dysrhythmia, Family history of adverse reaction to anesthesia, Hypertension, and Seizures (HCC).   Surgical History:   Past Surgical History:  Procedure Laterality Date   ABDOMINAL HYSTERECTOMY  2011   ovarian cancer   IRRIGATION AND DEBRIDEMENT ABDOMEN N/A 12/31/2014   Procedure: IRRIGATION AND DEBRIDEMENT ABDOMINAL WALL ABSCESS;  Surgeon: Luretha Murphy, MD;  Location: [facility] ORS;  Service: General;  Laterality: N/A;     Social History:   reports that she has never smoked. She does not have any smokeless tobacco history on file. She reports that she does not drink alcohol and does not use drugs.   Family History:  Her family history is not on file.   Allergies Allergies  Allergen Reactions   Codeine Nausea And Vomiting   Penicillins     Family history of allergry.       Home Medications  Prior to Admission medications   Medication Sig Start Date End Date Taking? Authorizing Provider  aspirin 81 MG chewable tablet Chew 81 mg by mouth daily.    [provider]  atorvastatin (LIPITOR) 40 MG tablet Take 40 mg by mouth daily.    [provider]  calcium-vitamin D (OSCAL WITH D) 500-200 MG-UNIT per tablet Take 1 tablet by mouth 2 (two) times daily.    [provider]  canagliflozin (INVOKANA) 300 MG TABS tablet Take 300 mg by mouth daily before breakfast.    [provider]  Diphenhydramine-PE-APAP (ALLERGY RELIEF PLUS SINUS PO) Take 1 tablet by mouth 2 (two) times daily.    [provider]  docusate sodium (COLACE) 100 MG capsule Take 1 capsule (100 mg total) by mouth 2 (two) times daily as needed for mild constipation. 01/04/15   Nonie Hoyer, PA-C  doxycycline (VIBRA-TABS) 100 MG tablet Take 1 tablet (100 mg total) by mouth every 12 (twelve) hours.  01/04/15   Nonie Hoyer, PA-C  HYDROcodone-acetaminophen (NORCO/VICODIN) 5-325 MG per tablet Take 1-2 tablets by mouth every 6 (six) hours as needed for moderate pain. 01/04/15   Nonie Hoyer, PA-C  losartan (COZAAR) 100 MG tablet Take 100 mg by mouth daily.    [provider]  metFORMIN (GLUCOPHAGE) 1000 MG tablet Take 1,000 mg by mouth 2 (two) times daily with a meal.    [provider]     Critical care time: 65 minutes      Zada Girt, AGNP  Pulmonary/Critical Care Pager [phone] (please enter 7 digits) PCCM Consult Pager [phone] (please enter 7 digits)

## 2023-04-01 ENCOUNTER — Inpatient Hospital Stay (HOSPITAL_COMMUNITY)
Admit: 2023-04-01 | Discharge: 2023-04-01 | Disposition: A | Discharge status: Home / Self Care | Payer: 59 | Attending: Critical Care Medicine | Admitting: Critical Care Medicine

## 2023-04-01 ENCOUNTER — Inpatient Hospital Stay: Payer: 59

## 2023-04-01 DIAGNOSIS — G9340 Encephalopathy, unspecified: Secondary | ICD-10-CM | POA: Diagnosis not present

## 2023-04-01 DIAGNOSIS — N179 Acute kidney failure, unspecified: Secondary | ICD-10-CM | POA: Diagnosis not present

## 2023-04-01 DIAGNOSIS — E872 Acidosis, unspecified: Secondary | ICD-10-CM | POA: Diagnosis not present

## 2023-04-01 DIAGNOSIS — R9431 Abnormal electrocardiogram [ECG] [EKG]: Secondary | ICD-10-CM

## 2023-04-01 DIAGNOSIS — E8729 Other acidosis: Secondary | ICD-10-CM

## 2023-04-01 LAB — BASIC METABOLIC PANEL
Anion gap: 16 — ABNORMAL HIGH (ref 5–15)
Anion gap: 11 (ref 5–15)
Anion gap: 7 (ref 5–15)
BUN: 36 mg/dL — ABNORMAL HIGH (ref 6–20)
BUN: 33 mg/dL — ABNORMAL HIGH (ref 6–20)
BUN: 29 mg/dL — ABNORMAL HIGH (ref 6–20)
CO2: 24 mmol/L (ref 22–32)
CO2: 26 mmol/L (ref 22–32)
CO2: 25 mmol/L (ref 22–32)
Calcium: 8.1 mg/dL — ABNORMAL LOW (ref 8.9–10.3)
Calcium: 8.1 mg/dL — ABNORMAL LOW (ref 8.9–10.3)
Calcium: 7.7 mg/dL — ABNORMAL LOW (ref 8.9–10.3)
Chloride: 98 mmol/L (ref 98–111)
Chloride: 100 mmol/L (ref 98–111)
Chloride: 100 mmol/L (ref 98–111)
Creatinine, Ser: 3.14 mg/dL — ABNORMAL HIGH (ref 0.44–1.00)
Creatinine, Ser: 3.42 mg/dL — ABNORMAL HIGH (ref 0.44–1.00)
Creatinine, Ser: 3.02 mg/dL — ABNORMAL HIGH (ref 0.44–1.00)
GFR, Estimated: 17 mL/min — ABNORMAL LOW (ref >60)
GFR, Estimated: 15 mL/min — ABNORMAL LOW (ref >60)
GFR, Estimated: 17 mL/min — ABNORMAL LOW (ref >60)
Glucose, Bld: 132 mg/dL — ABNORMAL HIGH (ref 70–99)
Glucose, Bld: 142 mg/dL — ABNORMAL HIGH (ref 70–99)
Glucose, Bld: 208 mg/dL — ABNORMAL HIGH (ref 70–99)
Potassium: 3.7 mmol/L (ref 3.5–5.1)
Potassium: 4.4 mmol/L (ref 3.5–5.1)
Potassium: 3.7 mmol/L (ref 3.5–5.1)
Sodium: 138 mmol/L (ref 135–145)
Sodium: 137 mmol/L (ref 135–145)
Sodium: 132 mmol/L — ABNORMAL LOW (ref 135–145)

## 2023-04-01 LAB — ECHOCARDIOGRAM COMPLETE
AR max vel: 1.76 cm2
AV Area VTI: 1.95 cm2
AV Area mean vel: 1.64 cm2
AV Mean grad: 6 mmHg
AV Peak grad: 10.6 mmHg
Ao pk vel: 1.63 m/s
Area-P 1/2: 5.38 cm2
Calc EF: 38.4 %
Height: 61 in
MV VTI: 2.67 cm2
S' Lateral: 3.9 cm
Single Plane A2C EF: 38.1 %
Single Plane A4C EF: 38.3 %
Weight: 2994.73 [oz_av]

## 2023-04-01 LAB — RENAL FUNCTION PANEL
Albumin: 2.8 g/dL — ABNORMAL LOW (ref 3.5–5.0)
Anion gap: 13 (ref 5–15)
BUN: 26 mg/dL — ABNORMAL HIGH (ref 6–20)
CO2: 23 mmol/L (ref 22–32)
Calcium: 7.9 mg/dL — ABNORMAL LOW (ref 8.9–10.3)
Chloride: 99 mmol/L (ref 98–111)
Creatinine, Ser: 2.91 mg/dL — ABNORMAL HIGH (ref 0.44–1.00)
GFR, Estimated: 18 mL/min — ABNORMAL LOW (ref >60)
Glucose, Bld: 151 mg/dL — ABNORMAL HIGH (ref 70–99)
Phosphorus: 2.8 mg/dL (ref 2.5–4.6)
Potassium: 3.9 mmol/L (ref 3.5–5.1)
Sodium: 135 mmol/L (ref 135–145)

## 2023-04-01 LAB — CBC
HCT: 38 % (ref 36.0–46.0)
Hemoglobin: 12.9 g/dL (ref 12.0–15.0)
MCH: 28.7 pg (ref 26.0–34.0)
MCHC: 33.9 g/dL (ref 30.0–36.0)
MCV: 84.4 fL (ref 80.0–100.0)
Platelets: 192 10*3/uL (ref 150–400)
RBC: 4.5 MIL/uL (ref 3.87–5.11)
RDW: 13.2 % (ref 11.5–15.5)
WBC: 11.5 10*3/uL — ABNORMAL HIGH (ref 4.0–10.5)
nRBC: 0 % (ref 0.0–0.2)

## 2023-04-01 LAB — BLOOD GAS, VENOUS
Acid-Base Excess: 1.3 mmol/L (ref 0.0–2.0)
Bicarbonate: 27.2 mmol/L (ref 20.0–28.0)
O2 Saturation: 74.1 %
Patient temperature: 37
pCO2, Ven: 47 mmHg (ref 44–60)
pH, Ven: 7.37 (ref 7.25–7.43)
pO2, Ven: 41 mmHg (ref 32–45)

## 2023-04-01 LAB — BETA-HYDROXYBUTYRIC ACID
Beta-Hydroxybutyric Acid: 1.57 mmol/L — ABNORMAL HIGH (ref 0.05–0.27)
Beta-Hydroxybutyric Acid: 0.61 mmol/L — ABNORMAL HIGH (ref 0.05–0.27)

## 2023-04-01 LAB — GLUCOSE, CAPILLARY
Glucose-Capillary: 100 mg/dL — ABNORMAL HIGH (ref 70–99)
Glucose-Capillary: 113 mg/dL — ABNORMAL HIGH (ref 70–99)
Glucose-Capillary: 120 mg/dL — ABNORMAL HIGH (ref 70–99)
Glucose-Capillary: 121 mg/dL — ABNORMAL HIGH (ref 70–99)
Glucose-Capillary: 123 mg/dL — ABNORMAL HIGH (ref 70–99)
Glucose-Capillary: 123 mg/dL — ABNORMAL HIGH (ref 70–99)
Glucose-Capillary: 126 mg/dL — ABNORMAL HIGH (ref 70–99)
Glucose-Capillary: 126 mg/dL — ABNORMAL HIGH (ref 70–99)
Glucose-Capillary: 127 mg/dL — ABNORMAL HIGH (ref 70–99)
Glucose-Capillary: 129 mg/dL — ABNORMAL HIGH (ref 70–99)
Glucose-Capillary: 129 mg/dL — ABNORMAL HIGH (ref 70–99)
Glucose-Capillary: 135 mg/dL — ABNORMAL HIGH (ref 70–99)
Glucose-Capillary: 139 mg/dL — ABNORMAL HIGH (ref 70–99)
Glucose-Capillary: 143 mg/dL — ABNORMAL HIGH (ref 70–99)
Glucose-Capillary: 153 mg/dL — ABNORMAL HIGH (ref 70–99)
Glucose-Capillary: 157 mg/dL — ABNORMAL HIGH (ref 70–99)
Glucose-Capillary: 160 mg/dL — ABNORMAL HIGH (ref 70–99)
Glucose-Capillary: 204 mg/dL — ABNORMAL HIGH (ref 70–99)
Glucose-Capillary: 85 mg/dL (ref 70–99)
Glucose-Capillary: 99 mg/dL (ref 70–99)

## 2023-04-01 LAB — LACTIC ACID, PLASMA
Lactic Acid, Venous: 8.5 mmol/L (ref 0.5–1.9)
Lactic Acid, Venous: 4 mmol/L (ref 0.5–1.9)
Lactic Acid, Venous: 4.1 mmol/L (ref 0.5–1.9)
Lactic Acid, Venous: 2.5 mmol/L (ref 0.5–1.9)
Lactic Acid, Venous: 3.8 mmol/L (ref 0.5–1.9)

## 2023-04-01 LAB — HEPATIC FUNCTION PANEL
ALT: 21 U/L (ref 0–44)
AST: 26 U/L (ref 15–41)
Albumin: 2.9 g/dL — ABNORMAL LOW (ref 3.5–5.0)
Alkaline Phosphatase: 38 U/L (ref 38–126)
Bilirubin, Direct: 0.2 mg/dL (ref 0.0–0.2)
Indirect Bilirubin: 0.9 mg/dL (ref 0.3–0.9)
Total Bilirubin: 1.1 mg/dL (ref 0.3–1.2)
Total Protein: 5.8 g/dL — ABNORMAL LOW (ref 6.5–8.1)

## 2023-04-01 LAB — TROPONIN I (HIGH SENSITIVITY): Troponin I (High Sensitivity): 475 ng/L (ref <18)

## 2023-04-01 LAB — AMYLASE: Amylase: 517 U/L — ABNORMAL HIGH (ref 28–100)

## 2023-04-01 LAB — LIPASE, BLOOD: Lipase: 198 U/L — ABNORMAL HIGH (ref 11–51)

## 2023-04-01 LAB — MAGNESIUM: Magnesium: 2.5 mg/dL — ABNORMAL HIGH (ref 1.7–2.4)

## 2023-04-01 LAB — PROCALCITONIN: Procalcitonin: 0.44 ng/mL

## 2023-04-01 LAB — PHOSPHORUS: Phosphorus: 2 mg/dL — ABNORMAL LOW (ref 2.5–4.6)

## 2023-04-01 MED ORDER — VANCOMYCIN HCL 750 MG/150ML IV SOLN
750.0000 mg | Freq: Once | INTRAVENOUS | Status: DC
  Filled 2023-04-01: qty 150

## 2023-04-01 MED ORDER — ENSURE ENLIVE PO LIQD
237.0000 mL | Freq: Three times a day (TID) | ORAL | Status: DC
  Administered 2023-04-01: 237 mL via ORAL

## 2023-04-01 MED ORDER — IOHEXOL 9 MG/ML PO SOLN
500.0000 mL | ORAL | Status: AC
  Administered 2023-04-01 (×2): 500 mL via ORAL

## 2023-04-01 MED ORDER — SODIUM CHLORIDE 0.9 % IV SOLN
2.0000 g | Freq: Two times a day (BID) | INTRAVENOUS | Status: AC
  Administered 2023-04-01 – 2023-04-02 (×3): 2 g via INTRAVENOUS
  Filled 2023-04-01 (×3): qty 12.5

## 2023-04-01 MED ORDER — INSULIN ASPART 100 UNIT/ML IJ SOLN: 0.0000 [IU] | Freq: Every day | INTRAMUSCULAR | Status: DC

## 2023-04-01 MED ORDER — ADULT MULTIVITAMIN W/MINERALS CH
1.0000 | ORAL_TABLET | Freq: Every day | ORAL | Status: DC
  Administered 2023-04-02 – 2023-04-05 (×4): 1 via ORAL
  Filled 2023-04-01 (×4): qty 1

## 2023-04-01 MED ORDER — INSULIN GLARGINE-YFGN 100 UNIT/ML ~~LOC~~ SOLN
8.0000 [IU] | Freq: Every day | SUBCUTANEOUS | Status: DC
  Administered 2023-04-02 – 2023-04-07 (×5): 8 [IU] via SUBCUTANEOUS
  Filled 2023-04-01 (×6): qty 0.08

## 2023-04-01 MED ORDER — INSULIN ASPART 100 UNIT/ML IJ SOLN
0.0000 [IU] | Freq: Three times a day (TID) | INTRAMUSCULAR | Status: DC
  Administered 2023-04-02 – 2023-04-04 (×4): 1 [IU] via SUBCUTANEOUS
  Administered 2023-04-05: 2 [IU] via SUBCUTANEOUS
  Administered 2023-04-05: 1 [IU] via SUBCUTANEOUS
  Administered 2023-04-06 – 2023-04-07 (×2): 2 [IU] via SUBCUTANEOUS
  Filled 2023-04-01 (×7): qty 1

## 2023-04-01 MED ORDER — SODIUM CHLORIDE 0.9 % IV SOLN
2.0000 g | Freq: Once | INTRAVENOUS | Status: AC
  Administered 2023-04-01: 2 g via INTRAVENOUS
  Filled 2023-04-01: qty 12.5

## 2023-04-01 MED ORDER — RENA-VITE PO TABS
1.0000 | ORAL_TABLET | Freq: Every day | ORAL | Status: DC
  Administered 2023-04-01 – 2023-04-05 (×5): 1 via ORAL
  Filled 2023-04-01 (×5): qty 1

## 2023-04-01 MED ORDER — MIDODRINE HCL 5 MG PO TABS
5.0000 mg | ORAL_TABLET | Freq: Three times a day (TID) | ORAL | Status: DC
  Administered 2023-04-01 – 2023-04-02 (×3): 5 mg via ORAL
  Filled 2023-04-01 (×3): qty 1

## 2023-04-01 MED ORDER — POTASSIUM PHOSPHATES 15 MMOLE/5ML IV SOLN
21.0000 mmol | Freq: Once | INTRAVENOUS | Status: AC
  Administered 2023-04-01: 21 mmol via INTRAVENOUS
  Filled 2023-04-01: qty 7

## 2023-04-01 MED ORDER — INSULIN GLARGINE-YFGN 100 UNIT/ML ~~LOC~~ SOLN
8.0000 [IU] | Freq: Every day | SUBCUTANEOUS | Status: DC
  Administered 2023-04-01: 8 [IU] via SUBCUTANEOUS
  Filled 2023-04-01: qty 0.08

## 2023-04-01 MED ORDER — PERFLUTREN LIPID MICROSPHERE
1.0000 mL | INTRAVENOUS | Status: AC | PRN
  Administered 2023-04-01: 5 mL via INTRAVENOUS

## 2023-04-01 MED ORDER — VANCOMYCIN HCL 750 MG/150ML IV SOLN
750.0000 mg | Freq: Once | INTRAVENOUS | Status: AC
  Administered 2023-04-01: 750 mg via INTRAVENOUS
  Filled 2023-04-01 (×2): qty 150

## 2023-04-01 NOTE — Progress Notes (Signed)
Attempted to contact pts friend Isabella Bowens to provide an update regarding pts condition per pt request, however she did not answer the telephone.  Will attempt to contact Ms. Walker later today.  Zada Girt, AGNP  Pulmonary/Critical Care Pager [phone] (please enter 7 digits) PCCM Consult Pager [phone] (please enter 7 digits)

## 2023-04-01 NOTE — Progress Notes (Signed)
*  PRELIMINARY RESULTS* Echocardiogram 2D Echocardiogram has been performed.  Sarah Lane 04/01/2023, 1:36 PM

## 2023-04-01 NOTE — Progress Notes (Signed)
Initial Nutrition Assessment  DOCUMENTATION CODES:   Obesity unspecified  INTERVENTION:   Ensure Enlive po TID, each supplement provides 350 kcal and 20 grams of protein.  MVI po daily   Rena-vit po daily   Pt at high refeed risk; recommend monitor potassium, magnesium and phosphorus labs daily until stable  Daily weights   NUTRITION DIAGNOSIS:   Inadequate oral intake related to acute illness as evidenced by meal completion < 25%.  GOAL:   Patient will meet greater than or equal to 90% of their needs  MONITOR:   Supplement acceptance, PO intake, Weight trends, Labs, Skin, I & O's  REASON FOR ASSESSMENT:   Consult Assessment of nutrition requirement/status  ASSESSMENT:   57 yo female with a PMH of type II diabetes mellitus, seizures, HTN, dysrhythmia, cardiomyopathy, CHF, stroke s/p thrombectomy, hepatosteatosis, lung nodule and ovarian cancer who is admitted with nausea, vomiting and abdominal pain and was found to have sepsis, DKA and AKI.  Met with pt in room today. Pt reports decreased appetite and oral intake for 1-2 days pta r/t nausea, vomiting and abdominal pain. CT from 8/21 with NSF. Pancreatic enzymes elevated; repeat CT scan from today is pending. Per chart, it appears pt has had several admissions over the past 2 years for similar complaints. Pt does not have a h/o gastroparesis. 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 Pt seen by SLP and initiated on a dysphagia 3/thin liquid diet. Pt ate only bites of her breakfast this morning. RD discussed with pt the importance of adequate nutrition needed to preserve lean muscle. Pt is willing to drink chocolate Ensure in hospital. RD will add supplements and vitamins to help pt meet her estimated needs. Pt is at high refeed risk. Recommend NGT placement and nutrition support if pt's oral intake remains poor.   Pt with AKI and was initiated on CRRT yesterday.   Per chart, pt appears fairly weight stable at baseline.    Medications reviewed and include: heparin, cefepime, LRS w/ 5% dextrose @125ml /hr, pepcid, insulin, neo-synephrine, KPhos  Labs reviewed: Na 132(L), BUN 29(H), creat 3.02(H), P 2.0(L), Mg 2.5(H) amylase 517(H), lipase 198(H) Wbc- 11.5(H) Cbgs- 204, 157, 143, 135, 139, 127, 153, 123, 121, 123, 113 x 24 hrs  AIC 8.2(H)- 8/21  NUTRITION - FOCUSED PHYSICAL EXAM:  Flowsheet Row Most Recent Value  Orbital Region No depletion  Upper Arm Region No depletion  Thoracic and Lumbar Region No depletion  Buccal Region No depletion  Temple Region No depletion  Clavicle Bone Region No depletion  Clavicle and Acromion Bone Region No depletion  Scapular Bone Region No depletion  Dorsal Hand No depletion  Patellar Region No depletion  Anterior Thigh Region No depletion  Posterior Calf Region No depletion  Edema (RD Assessment) None  Hair Reviewed  Eyes Reviewed  Mouth Reviewed  Skin Reviewed  Nails Reviewed   Diet Order:   Diet Order             DIET DYS 3 Room service appropriate? Yes; Fluid consistency: Thin  Diet effective now                  EDUCATION NEEDS:   Education needs have been addressed  Skin:  Skin Assessment: Reviewed RN Assessment (ecchymosis)  Last BM:  8/19  Height:   Ht Readings from Last 1 Encounters:  03/31/23 5\' 1"  (1.549 m)    Weight:   Wt Readings from Last 1 Encounters:  04/01/23 84.9 kg  Ideal Body Weight:  47.7 kg  BMI:  Body mass index is 35.37 kg/m.  Estimated Nutritional Needs:   Kcal:  1700-1900kcal/day  Protein:  85-95g/day  Fluid:  1.4-1.6L/day  Betsey Holiday MS, RD, LDN Please refer to [facility] for RD and/or RD on-call/weekend/after hours pager

## 2023-04-01 NOTE — Progress Notes (Signed)
CRRT filter clotted and set discarded. Pt drinking PO contrast at this time for abd CT scan ordered.

## 2023-04-01 NOTE — Inpatient Diabetes Management (Addendum)
Inpatient Diabetes Program Recommendations  AACE/ADA: New Consensus Statement on Inpatient Glycemic Control (2015)  Target Ranges:  Prepandial:   less than 140 mg/dL      Peak postprandial:   less than 180 mg/dL (1-2 hours)      Critically ill patients:  140 - 180 mg/dL   Lab Results  Component Value Date   GLUCAP 135 (H) 04/01/2023   HGBA1C 8.2 (H) 03/31/2023    Latest Reference Range & Units 03/31/23 11:03  Sodium 135 - 145 mmol/L 136  Potassium 3.5 - 5.1 mmol/L 4.8  Chloride 98 - 111 mmol/L 88 (L)  CO2 22 - 32 mmol/L <7 (L)  Glucose 70 - 99 mg/dL 562 (H)  BUN 6 - 20 mg/dL 73 (H)  Creatinine 1.30 - 1.00 mg/dL 8.65 (H)  Calcium 8.9 - 10.3 mg/dL 78.4  Anion gap 5 - 15  NOT CALCULATED  (L): Data is abnormally low (H): Data is abnormally high  Latest Reference Range & Units Most Recent  Sodium 135 - 145 mmol/L 137 04/01/23 10:33  Potassium 3.5 - 5.1 mmol/L 4.4 04/01/23 10:33  Chloride 98 - 111 mmol/L 100 04/01/23 10:33  CO2 22 - 32 mmol/L 26 04/01/23 10:33  Glucose 70 - 99 mg/dL 696 (H) 2/95/28 41:32  Mean Plasma Glucose mg/dL 440.10 2/72/53 66:44  BUN 6 - 20 mg/dL 33 (H) 0/34/74 25:95  Creatinine 0.44 - 1.00 mg/dL 6.38 (H) 7/56/43 32:95  Calcium 8.9 - 10.3 mg/dL 8.1 (L) 1/88/41 66:06  Anion gap 5 - 15  11 04/01/23 10:33  (H): Data is abnormally high (L): Data is abnormally low  Latest Reference Range & Units 04/01/23 10:33  Beta-Hydroxybutyric Acid 0.05 - 0.27 mmol/L 1.57 (H)  (H): Data is abnormally high  Diabetes history: DM2 Outpatient Diabetes medications: Lantus 15 units qam, Metformin 1 gm bid, Jardiance 10 mg qam Current orders for Inpatient glycemic control: IV insulin  Inpatient Diabetes Program Recommendations:   Patient admitted with pancreatitis.  Noted CO2 26 and Anion gap 11. When transitioning to Subcutaneous insulin when Beta-Hydroxybutyric Acid < 0.5, please consider: -Semglee 8 units every day (give first dose 2 hrs prior to D/C of IV  insulin -Novolog 0-9 units q 4 hrs. While NPO (cover CBG when IV drip discontinued), then tid, & 0-5 units hs  Will follow during hospitalization.  Thank you, Sarah Lane. Earnesteen Birnie, RN, MSN, CDE  Diabetes Coordinator Inpatient Glycemic Control Team Team Pager [phone] (8am-5pm) 04/01/2023 11:33 AM

## 2023-04-01 NOTE — Progress Notes (Signed)
NAME:  Sarah Lane, MRN:  [id number], DOB:  [birth year], LOS: 1 ADMISSION DATE:  03/31/2023, CONSULTATION DATE: 03/31/23 REFERRING MD: Dr. Erma Heritage, CHIEF COMPLAINT: N/V   History of Present Illness:  This is a 57 yo female with a PMH of type II diabetes mellitus, seizures, HTN, dysrhythmia, and ovarian cancer.  She presented to [facility] ER on 08/21 via EMS with c/o nausea/vomiting/diarrhea/abdominal pain with inability to tolerate po's.  Onset of symptoms the night of 08/20.  Pt reports her CBG's have been running "High"although she has been taking and tolerating her metformin, jardiance, and insulin.  Upon EMS arrival at pts home she was hypoglycemic with CBG's in the 30's. She received 250 ml bolus of D10W with CBG increasing to 86.  She reports she is living with friends and has had contact with someone recently diagnosed with COVID.    ED Course  Upon arrival to the ER pt confused with weakness, dry mouth, and shortness of breath.  CXR concerning for right lower lobe pulmonary nodule seen on previous CT Chest, but negative for consolidation.  COVID/Influenza A&B/RSV negative.  Significant lab results were: chloride 88/CO2 <7/glucose 152/BUN 73/creatinine 7.31/anion gap not calculated/lipase 240/AST 52/total protein 8.5/lactic acid >9.0/wbc 15.0/beta-hydroxy >8.00.  UA results were: glucose 50/hgb moderate/ketones 5/leukocytes small/protein >= 300/bacteria rare/wbc 21-50.  Pt met sepsis criteria and received: 3L NS bolus/1L LR bolus/1 amp of sodium bicarb.  Cefepime, vancomycin, and metronidazole ordered per EDP.  PCCM team contacted for ICU admission.  CT Abd Pelvis without oral or iv contrast: Suboptimal exam. No acute inflammatory process identified within the abdomen or pelvis. No discrete metastatic disease identified. Multiple other nonacute observations, as described above. Aortic Atherosclerosis (ICD10-I70.0).  CT Head WO Contrast: No acute intracranial abnormality. 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 Mild microvascular ischemic changes.   Pertinent  Medical History  Childhood Asthma  Ovarian Cancer  Type II Diabetes Mellitus  Dysrhythmia  HTN  Seizures   Micro Data:  COVID 08/21>>negative  Influenza A&B 08/21>>negative  RSV 08/21>>negative  MRSA PCR 08/21>>positive  Blood x2 08/21>> Urine 08/21>>  Anti-infectives (From admission, onward)    Start     Dose/Rate Route Frequency Ordered Stop   04/01/23 1800  ceFEPIme (MAXIPIME) 1 g in sodium chloride 0.9 % 100 mL IVPB  Status:  Discontinued        1 g 200 mL/hr over 30 Minutes Intravenous Every 24 hours 03/31/23 1844 04/01/23 0327   04/01/23 0800  vancomycin (VANCOREADY) IVPB 750 mg/150 mL        750 mg 150 mL/hr over 60 Minutes Intravenous  Once 04/01/23 0446     04/01/23 0700  metroNIDAZOLE (FLAGYL) IVPB 500 mg        500 mg 100 mL/hr over 60 Minutes Intravenous Every 12 hours 03/31/23 1901     04/01/23 0600  ceFEPIme (MAXIPIME) 2 g in sodium chloride 0.9 % 100 mL IVPB        2 g 200 mL/hr over 30 Minutes Intravenous  Once 04/01/23 0327 04/01/23 0623   04/01/23 0600  vancomycin (VANCOREADY) IVPB 750 mg/150 mL  Status:  Discontinued        750 mg 150 mL/hr over 60 Minutes Intravenous  Once 04/01/23 0334 04/01/23 0446   03/31/23 1846  vancomycin variable dose per unstable renal function (pharmacist dosing)         Does not apply See admin instructions 03/31/23 1847     03/31/23 1300  vancomycin (  VANCOREADY) IVPB 1750 mg/350 mL        1,750 mg 175 mL/hr over 120 Minutes Intravenous  Once 03/31/23 1245 03/31/23 1900   03/31/23 1300  ceFEPIme (MAXIPIME) 2 g in sodium chloride 0.9 % 100 mL IVPB        2 g 200 mL/hr over 30 Minutes Intravenous  Once 03/31/23 1245 03/31/23 1842   03/31/23 1245  metroNIDAZOLE (FLAGYL) IVPB 500 mg        500 mg 100 mL/hr over 60 Minutes Intravenous  Once 03/31/23 1232 03/31/23 1758       Significant Hospital Events: Including procedures,  antibiotic start and stop dates in addition to other pertinent events   08/21: Pt admitted with euglycemic DKA suspect secondary to SGLT 2 requiring insulin gtt, acute metabolic encephalopathy, sepsis secondary to possible acute pancreatitis and/or UTI, severe lactic/metabolic acidosis, and acute kidney injury.  Nephrology consulted and CRRT initiated  08/22: Pt remains anuric on CRRT lactic/metabolic acidosis improving.  Beta-hydroxy remains elevated continuing insulin gtt until normalized   Interim History / Subjective:  Pt states shortness of breath has improved and she still has mild abdominal pain   Objective   Blood pressure 103/63, pulse 93, temperature 98.6 F (37 C), temperature source Axillary, resp. rate 12, height 5\' 1"  (1.549 m), weight 84.9 kg, SpO2 97%.        Intake/Output Summary (Last 24 hours) at 04/01/2023 0721 Last data filed at 04/01/2023 0700 Gross per 24 hour  Intake 7133.79 ml  Output 1700 ml  Net 5433.79 ml   Filed Weights   03/31/23 0944 03/31/23 1410 04/01/23 0500  Weight: 79.8 kg 81 kg 84.9 kg    Examination: General: Acutely-ill appearing female HENT: Supple, no JVD Lungs: Clear throughout, mildly labored and tachypneic  Cardiovascular: NSR, s1s2, no r/g, 2+ radial/2+ distal pulses, no edema  Abdomen: +BS x4, soft, non distended, mild tenderness present   Extremities: Normal bulk and tone, moves all extremities  Neuro: Alert and oriented, following commands, PERRLA  GU: Indwelling foley catheter with minimal uop   Resolved Hospital Problem list   Nausea/Vomiting   Assessment & Plan:   #Acute metabolic encephalopathy~improving   CT Head 08/21: No acute intracranial abnormality. 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 Mild microvascular ischemic changes. - Correct metabolic derangements  - Avoid sedating medication's when able  - Maintain sleep/wake cycle  - Supportive care   #Sepsis  #Sinus  tachycardia with Qtc prolongation~improving  #Elevated troponin suspect secondary to demand ischemia  - Continuous telemetry monitoring  - Follow EKG's  - Troponin peaked at 495 - Trend lactic acid until normalized  - Aggressive iv fluid resuscitation  - Maintain map >65  - Avoid Qtc prolongating medications  - TSH normal   #Acute kidney injury secondary to ATN and SGLT 2~improving  #Severe metabolic and lactic acidosis~improving   #Hypophosphatemia  #Hypomagnesia  - Trend BMP, lactic acid, and vbg  - Replace electrolytes as indicated  - Strict I&O - Avoid nephrotoxic medications - Aggressive iv fluid resuscitation   - Nephrology consulted appreciate input: continue CRRT per recommendations  - Urine protein/creatinine ratio; glomerular basement membrane antibodies; and multiple myeloma panel results pending    #Possible acute pancreatitis  #Possible UTI  CT Abd Pelvis WO IV or Oral Contrast 08/21: Suboptimal exam. No acute inflammatory process identified within the abdomen or pelvis.  - Trend WBC and lipase  - Monitor fever curve  - Trend PCT  - Follow  cultures  - Continue abx as outlined above - Will repeat CT Abd/Pelvis with PO Contrast    #Elevated AST~resolved   - Trend hepatic function panel   #Dysphagia  - Speech therapy consulted appreciate input   #Euglycemic DKA suspect secondary to SGLT2 - Continue insulin gtt until anion gap closed and serum CO2 20 or higher  - Trend BMP and beta-hydroxybutyric acid per DKA protocol  - CBG's per endo tool  - Diabetes coordinator consulted appreciate input  - Pt should stop taking outpatient jardiance   Best Practice (right click and "Reselect all SmartList Selections" daily)  Diet/type: NPO  DVT prophylaxis: prophylactic heparin  GI prophylaxis: H2B Lines: Left femoral temporary trialysis catheter and it is still needed  Foley:  Yes, and it is still needed Code Status:  full code Last date of multidisciplinary goals of  care discussion [04/01/23]  08/22: Updated pt regarding current plan of care and all questions answered  Labs   CBC: Recent Labs  Lab 03/31/23 1005 03/31/23 1451 04/01/23 0406  WBC 15.0* 17.8* 11.5*  NEUTROABS 13.5*  --   --   HGB 17.3* 14.4 12.9  HCT 57.9* 45.4 38.0  MCV 94.3 89.9 84.4  PLT 316 303 192    Basic Metabolic Panel: Recent Labs  Lab 03/31/23 1103 03/31/23 1449 03/31/23 1751 03/31/23 1752 03/31/23 2030 03/31/23 2308 04/01/23 0406  NA 136 141 140  --   --  142 138  K 4.8 4.6 4.4  --   --  3.8 3.7  CL 88* 96* 96*  --   --  99 98  CO2 <7* 7* <7*  --   --  16* 24  GLUCOSE 152* 115* 115*  --   --  103* 132*  BUN 73* 66* 66*  --   --  49* 36*  CREATININE 7.31* 6.47* 6.42*  --   --  4.39* 3.14*  CALCIUM 10.0 8.3* 8.2*  --   --  7.7* 8.1*  MG  --   --   --  1.1* 1.1*  --  2.5*  PHOS  --   --   --  7.8*  --   --  2.0*   GFR: Estimated Creatinine Clearance: 19.3 mL/min (A) (by C-G formula based on SCr of 3.14 mg/dL (H)). Recent Labs  Lab 03/31/23 1005 03/31/23 1150 03/31/23 1449 03/31/23 1451 03/31/23 1751 03/31/23 2030 04/01/23 0106 04/01/23 0406  PROCALCITON  --   --  0.30  --   --   --   --   --   WBC 15.0*  --   --  17.8*  --   --   --  11.5*  LATICACIDVEN  --    < > >9.0*  --  >9.0* >9.0* 8.5* 4.0*   < > = values in this interval not displayed.    Liver Function Tests: Recent Labs  Lab 03/31/23 1103 04/01/23 0406  AST 52* 26  ALT 35 21  ALKPHOS 61 38  BILITOT 1.2 1.1  PROT 8.5* 5.8*  ALBUMIN 4.1 2.9*   Recent Labs  Lab 03/31/23 1103 03/31/23 1449 04/01/23 0406  LIPASE 240*  --  198*  AMYLASE  --  411*  --    No results for input(s): "AMMONIA" in the last 168 hours.  ABG    Component Value Date/Time   HCO3 10.1 (L) 03/31/2023 2030   ACIDBASEDEF 14.8 (H) 03/31/2023 2030   O2SAT 84 03/31/2023 2030     Coagulation Profile: No  results for input(s): "INR", "PROTIME" in the last 168 hours.  Cardiac Enzymes: No results for  input(s): "CKTOTAL", "CKMB", "CKMBINDEX", "TROPONINI" in the last 168 hours.  HbA1C: Hgb A1c MFr Bld  Date/Time Value Ref Range Status  03/31/2023 02:49 PM 8.2 (H) 4.8 - 5.6 % Final    Comment:    (NOTE) Pre diabetes:          5.7%-6.4%  Diabetes:              >6.4%  Glycemic control for   <7.0% adults with diabetes   12/31/2014 09:08 PM 6.9 (H) 4.8 - 5.6 % Final    Comment:    (NOTE)         Pre-diabetes: 5.7 - 6.4         Diabetes: >6.4         Glycemic control for adults with diabetes: <7.0     CBG: Recent Labs  Lab 04/01/23 0303 04/01/23 0404 04/01/23 0503 04/01/23 0607 04/01/23 0703  GLUCAP 113* 123* 121* 123* 153*    Review of Systems: Positives in BOLD   Gen: fever, chills, weight change, fatigue, night sweats HEENT: Denies blurred vision, double vision, hearing loss, tinnitus, sinus congestion, rhinorrhea, sore throat, neck stiffness, dysphagia PULM: shortness of breath, cough, sputum production, hemoptysis, wheezing CV: Denies chest pain, edema, orthopnea, paroxysmal nocturnal dyspnea, palpitations GI: abdominal pain, nausea, vomiting, diarrhea, hematochezia, melena, constipation, change in bowel habits GU: Denies dysuria, hematuria, polyuria, oliguria, urethral discharge Endocrine: Denies hot or cold intolerance, polyuria, polyphagia or appetite change Derm: Denies rash, dry skin, scaling or peeling skin change Heme: Denies easy bruising, bleeding, bleeding gums Neuro: headache, numbness, weakness, slurred speech, loss of memory or consciousness   Past Medical History:  She,  has a past medical history of Asthma, Cancer (HCC), Diabetes mellitus without complication (HCC), Dysrhythmia, Family history of adverse reaction to anesthesia, Hypertension, and Seizures (HCC).   Surgical History:   Past Surgical History:  Procedure Laterality Date   ABDOMINAL HYSTERECTOMY  2011   ovarian cancer   IRRIGATION AND DEBRIDEMENT ABDOMEN N/A 12/31/2014   Procedure:  IRRIGATION AND DEBRIDEMENT ABDOMINAL WALL ABSCESS;  Surgeon: Luretha Murphy, MD;  Location: [facility] ORS;  Service: General;  Laterality: N/A;     Social History:   reports that she has never smoked. She does not have any smokeless tobacco history on file. She reports that she does not drink alcohol and does not use drugs.   Family History:  Her family history is not on file.   Allergies Allergies  Allergen Reactions   Codeine Nausea And Vomiting   Penicillins     Family history of allergry.     Ibuprofen Nausea And Vomiting     Home Medications  Prior to Admission medications   Medication Sig Start Date End Date Taking? Authorizing Provider  aspirin 81 MG chewable tablet Chew 81 mg by mouth daily.    [provider]  atorvastatin (LIPITOR) 40 MG tablet Take 40 mg by mouth daily.    [provider]  calcium-vitamin D (OSCAL WITH D) 500-200 MG-UNIT per tablet Take 1 tablet by mouth 2 (two) times daily.    [provider]  canagliflozin (INVOKANA) 300 MG TABS tablet Take 300 mg by mouth daily before breakfast.    [provider]  Diphenhydramine-PE-APAP (ALLERGY RELIEF PLUS SINUS PO) Take 1 tablet by mouth 2 (two) times daily.    [provider]  docusate sodium (COLACE) 100 MG capsule Take 1  capsule (100 mg total) by mouth 2 (two) times daily as needed for mild constipation. 01/04/15   Nonie Hoyer, PA-C  doxycycline (VIBRA-TABS) 100 MG tablet Take 1 tablet (100 mg total) by mouth every 12 (twelve) hours. 01/04/15   Nonie Hoyer, PA-C  HYDROcodone-acetaminophen (NORCO/VICODIN) 5-325 MG per tablet Take 1-2 tablets by mouth every 6 (six) hours as needed for moderate pain. 01/04/15   Nonie Hoyer, PA-C  losartan (COZAAR) 100 MG tablet Take 100 mg by mouth daily.    [provider]  metFORMIN (GLUCOPHAGE) 1000 MG tablet Take 1,000 mg by mouth 2 (two) times daily with a meal.    [provider]     Critical care time: 40 minutes       Zada Girt, AGNP  Pulmonary/Critical Care Pager [phone] (please enter 7 digits) PCCM Consult Pager [phone] (please enter 7 digits)

## 2023-04-01 NOTE — TOC Initial Note (Signed)
Transition of Care [facility]) - Initial/Assessment Note    Patient Details  Name: Sarah Lane MRN: [id number] Date of Birth: 1965/11/08  [facility]) CM/SW Contact:    Kreg Shropshire, RN Phone Number: 04/01/2023, 9:12 AM  Clinical Narrative:                 CM received TOC consult for pt stating "Pt friend stated that pt is not able to take care of herself and friend is not willing to take care of her. Cm spoke with pt and stated she does wish to return home in the future.   Pt arrived from ED from: Home Caregiver Support: Lives with friend,sister, mother DME at Home: Environmental consultant and Psychologist, clinical: friend/family Previous Services: none HH/SNF Preference: none First Person of Contact: Isabella Bowens PCP: [facility]   Cm will continue to follow for toc needs and d/c planning.     Barriers to Discharge: Continued Medical Work up   Patient Goals and CMS Choice   CMS Medicare.gov Compare Post Acute Care list provided to:: Patient Choice offered to / list presented to : Patient      Expected Discharge Plan and Services     Post Acute Care Choice: Home Health, Skilled Nursing Facility Living arrangements for the past 2 months: Skilled Nursing Facility                                      Prior Living Arrangements/Services Living arrangements for the past 2 months: Skilled Nursing Facility Lives with:: Self          Need for Family Participation in Patient Care: Yes (Comment) Care giver support system in place?: Yes (comment) Current home services: DME    Activities of Daily Living      Permission Sought/Granted                  Emotional Assessment Appearance:: Appears stated age     Orientation: : Oriented to Self, Oriented to  Time, Oriented to Place, Oriented to Situation      Admission diagnosis:  Sepsis [facility]) [A41.9] Patient Active Problem List   Diagnosis Date Noted   Sepsis (HCC) 03/31/2023   Acute renal  failure (HCC) 03/31/2023   Diabetes (HCC) 12/31/2014   Carbuncle of abdominal wall May 2016 12/31/2014   PCP:  [facility], [facility] Pharmacy:   [id number] [place], [place] - [address] [place] [address] Phone: [phone] Fax: [phone]     Social Determinants of Health (SDOH) Social History: SDOH Screenings   Food Insecurity: No Food Insecurity (03/11/2023)   Received from [facility]  Transportation Needs: No Transportation Needs (03/11/2023)   Received from [facility]  Financial Resource Strain: Low Risk  (07/15/2020)   Received from [facility]  Physical Activity: Insufficiently Active (07/15/2020)   Received from [facility]  Social Connections: Socially Isolated (07/15/2020)   Received from [facility]  Stress: No Stress Concern Present (07/15/2020)   Received from [facility]  Tobacco Use: Unknown (03/31/2023)  Health Literacy: Low Risk  (07/15/2020)   Received from [facility]   SDOH Interventions:     Readmission Risk Interventions     No data to display

## 2023-04-01 NOTE — Progress Notes (Addendum)
PHARMACY CONSULT NOTE  Pharmacy Consult for Electrolyte Monitoring and Replacement   Recent Labs: Potassium (mmol/L)  Date Value  04/01/2023 3.7   Magnesium (mg/dL)  Date Value  09/81/1914 2.5 (H)   Calcium (mg/dL)  Date Value  78/29/5621 8.1 (L)   Albumin (g/dL)  Date Value  30/86/5784 2.9 (L)   Phosphorus (mg/dL)  Date Value  69/62/9528 2.0 (L)   Sodium (mmol/L)  Date Value  04/01/2023 138   Corrected Ca: 8.98 mg/dL  Assessment: Sarah Lane is a 57 y.o. female with PMH including DM, seizures, HTN, dysrhythmia, ovarian cancer admitted on 03/31/2023 with  euglycemic DKA, sepsis secondary to acute pancreatitis, severe lactic acidosis, AKI, now on CRRT. Pharmacy consulted to assist with electrolyte monitoring and replacement as indicated.  Goal of Therapy:  Electrolytes within normal limits  Plan:  --Kphos IV x 1 --BMP ordered q4h --Will recheck all electrolytes tomorrow with AM labs --Pharmacy will continue to follow along  Bettey Costa, PharmD Clinical Pharmacist 04/01/2023 7:57 AM

## 2023-04-01 NOTE — Consult Note (Signed)
[facility] CONSULT NOTE    Date: 04/01/2023                  Patient Name:  Sarah Lane  MRN: [id number]  DOB: 05/08/1966  Age / Sex: 57 y.o., female         PCP: Care, [facility]                 Service Requesting Consult: Critical care                 Reason for Consult: Severe acute kidney injury and severe metabolic acidosis            History of Present Illness: Patient is a 57 y.o. female with.  Of note patient was on metformin, Jardiance, and insulin at home.  She was found to be actually hypoglycemic when EMS arrived.  Upon presentation her BUN was 73, creatinine 7.3, serum bicarbonate less than 7 with lactic acid greater than 9.  Her pH was 6.99 with pCO2 of 29.  We were subsequently called by critical care.  We recommended initiation of CRRT and the patient was started on this.  After CRRT was started pH improved to 7.27.  This a.m. BUN is down to 36 with creatinine of 3.14 and serum bicarbonate has normalized to 44.  Lactic acid resulted down to 4.0.   Medications: Outpatient medications: Medications Prior to Admission  Medication Sig Dispense Refill Last Dose   aspirin 81 MG chewable tablet Chew 81 mg by mouth daily.   03/30/2023   atorvastatin (LIPITOR) 80 MG tablet Take 80 mg by mouth daily.   03/30/2023   calcium-vitamin D (OSCAL WITH D) 500-200 MG-UNIT per tablet Take 1 tablet by mouth 2 (two) times daily.   03/30/2023   furosemide (LASIX) 40 MG tablet Take 40 mg by mouth 2 (two) times daily.   03/30/2023   JARDIANCE 10 MG TABS tablet Take 10 mg by mouth daily.   03/30/2023   LANTUS SOLOSTAR 100 UNIT/ML Solostar Pen Inject 15 Units into the skin at bedtime.   03/30/2023   metFORMIN (GLUCOPHAGE) 1000 MG tablet Take 1,000 mg by mouth 2 (two) times daily with a meal.   03/30/2023   metoprolol tartrate (LOPRESSOR) 25 MG tablet Take 12.5 mg by mouth 2 (two) times daily.   03/30/2023    Current medications: Current Facility-Administered Medications   Medication Dose Route Frequency Provider Last Rate Last Admin    prismasol BGK 4/2.5 infusion   CRRT Continuous Cartha Rotert, MD 500 mL/hr at 03/31/23 2111 New Bag at 03/31/23 2111    prismasol BGK 4/2.5 infusion   CRRT Continuous Ivori Storr, MD 500 mL/hr at 03/31/23 2111 New Bag at 03/31/23 2111   0.9 %  sodium chloride infusion  250 mL Intravenous Continuous Ezequiel Essex, NP       0.9 %  sodium chloride infusion  250 mL Intravenous Continuous Ezequiel Essex, NP   Stopped at 04/01/23 2952   Chlorhexidine Gluconate Cloth 2 % PADS 6 each  6 each Topical QHS Erin Fulling, MD   6 each at 03/31/23 1438   dextrose 5 % in lactated ringers infusion   Intravenous Continuous Ezequiel Essex, NP 125 mL/hr at 04/01/23 0719 New Bag at 04/01/23 0719   dextrose 50 % solution 0-50 mL  0-50 mL Intravenous PRN Ezequiel Essex, NP   50 mL at 03/31/23 1933   docusate sodium (COLACE) capsule 100 mg  100  mg Oral BID PRN Ezequiel Essex, NP       famotidine (PEPCID) IVPB 20 mg premix  20 mg Intravenous Q24H Ezequiel Essex, NP   Stopped at 03/31/23 1510   heparin injection 5,000 Units  5,000 Units Subcutaneous Q8H Ezequiel Essex, NP   5,000 Units at 04/01/23 0556   heparin sodium (porcine) injection 1,000-6,000 Units  1,000-6,000 Units Intracatheter PRN Mady Haagensen, MD   2,800 Units at 03/31/23 2000   insulin regular, human (MYXREDLIN) 100 units/ 100 mL infusion   Intravenous Continuous Ezequiel Essex, NP 0.5 mL/hr at 04/01/23 0700 0.5 Units/hr at 04/01/23 0700   lactated ringers infusion   Intravenous Continuous Ezequiel Essex, NP       metroNIDAZOLE (FLAGYL) IVPB 500 mg  500 mg Intravenous Q12H Ezequiel Essex, NP 100 mL/hr at 04/01/23 0700 Infusion Verify at 04/01/23 0700   mupirocin ointment (BACTROBAN) 2 % 1 Application  1 Application Nasal BID Erin Fulling, MD   1 Application at 03/31/23 2220   phenylephrine (NEO-SYNEPHRINE) 20mg /NS premix infusion  25-200 mcg/min Intravenous Titrated Ezequiel Essex, NP       polyethylene glycol (MIRALAX / GLYCOLAX) packet 17 g  17 g Oral Daily PRN Ezequiel Essex, NP       potassium PHOSPHATE 21 mmol in dextrose 5 % 500 mL infusion  21 mmol Intravenous Once Mila Merry A, RPH       prismasol BGK 4/2.5 infusion   CRRT Continuous Mansel Strother, MD 2,500 mL/hr at 04/01/23 0530 New Bag at 04/01/23 0530   sodium bicarbonate 150 mEq in dextrose 5 % 1,150 mL infusion   Intravenous Continuous Ezequiel Essex, NP   Stopped at 03/31/23 2301   vancomycin (VANCOREADY) IVPB 750 mg/150 mL  750 mg Intravenous Once Otelia Sergeant, RPH       vancomycin variable dose per unstable renal function (pharmacist dosing)   Does not apply See admin instructions Tressie Ellis, [facility]          Allergies: Allergies  Allergen Reactions   Codeine Nausea And Vomiting   Penicillins     Family history of allergry.     Ibuprofen Nausea And Vomiting      Past Medical History: Past Medical History:  Diagnosis Date   Asthma    childhood asthma   Cancer (HCC)    ovarian cancer   Diabetes mellitus without complication (HCC)    Dysrhythmia    Family history of adverse reaction to anesthesia    Grand father had facial swelling   Hypertension    Seizures (HCC)    petit mals at age 43 treated with medications. Last one age 24     Past Surgical History: Past Surgical History:  Procedure Laterality Date   ABDOMINAL HYSTERECTOMY  2011   ovarian cancer   IRRIGATION AND DEBRIDEMENT ABDOMEN N/A 12/31/2014   Procedure: IRRIGATION AND DEBRIDEMENT ABDOMINAL WALL ABSCESS;  Surgeon: Luretha Murphy, MD;  Location: [facility];  Service: General;  Laterality: N/A;     Family History: History reviewed. No pertinent family history.   Social History: Social History   Socioeconomic History   Marital status: Widowed    Spouse name: Not on file   Number of children: Not on file   Years of education: Not on file   Highest education level: Not on file  Occupational History   Not on  file  Tobacco Use   Smoking status: Never   Smokeless tobacco:  Not on file  Substance and Sexual Activity   Alcohol use: No   Drug use: No   Sexual activity: Never  Other Topics Concern   Not on file  Social History Narrative   Not on file   Social Determinants of Health   Financial Resource Strain: Low Risk  (07/15/2020)   Received from [facility]   Overall Financial Resource Strain (CARDIA)    Difficulty of Paying Living Expenses: Not very hard  Food Insecurity: No Food Insecurity (03/11/2023)   Received from [facility]   Hunger Vital Sign    Worried About Running Out of Food in the Last Year: Never true    Ran Out of Food in the Last Year: Never true  Transportation Needs: No Transportation Needs (03/11/2023)   Received from [facility] - Transportation    Lack of Transportation (Medical): No    Lack of Transportation (Non-Medical): No  Physical Activity: Insufficiently Active (07/15/2020)   Received from [facility]   Exercise Vital Sign    Days of Exercise per Week: 7 days    Minutes of Exercise per Session: 20 min  Stress: No Stress Concern Present (07/15/2020)   Received from [facility] - Occupational Stress Questionnaire    Feeling of Stress : Only a little  Social Connections: Socially Isolated (07/15/2020)   Received from [facility]   Social Connection and Isolation Panel [NHANES]    Frequency of Communication with Friends and Family: More than three times a week    Frequency of Social Gatherings with Friends and Family: More than three times a week    Attends Religious Services: Never    Database administrator or Organizations: No    Attends Banker Meetings: Never    Marital Status: Widowed  Intimate Partner Violence: Not At Risk (07/15/2020)   Received from [facility]   Humiliation, Afraid, Rape, and Kick questionnaire    Fear of Current or Ex-Partner: No     Emotionally Abused: No    Physically Abused: No    Sexually Abused: No     Review of Systems: Review of Systems  Constitutional:  Positive for malaise/fatigue. Negative for chills and fever.  HENT:  Negative for congestion, hearing loss and tinnitus.   Eyes:  Negative for blurred vision and double vision.  Respiratory:  Negative for cough, sputum production and shortness of breath.   Cardiovascular:  Negative for chest pain, palpitations and orthopnea.  Gastrointestinal:  Positive for abdominal pain, nausea and vomiting. Negative for diarrhea.  Genitourinary:  Negative for dysuria, frequency and urgency.  Musculoskeletal:  Negative for myalgias.  Skin:  Negative for itching and rash.  Neurological:  Positive for weakness. Negative for dizziness and focal weakness.  Endo/Heme/Allergies:  Negative for polydipsia. Does not bruise/bleed easily.  Psychiatric/Behavioral:  The patient is not nervous/anxious.      Vital Signs: Blood pressure 103/63, pulse 93, temperature 98.6 F (37 C), temperature source Axillary, resp. rate 12, height 5\' 1"  (1.549 m), weight 84.9 kg, SpO2 97%.  Weight trends: Filed Weights   03/31/23 0944 03/31/23 1410 04/01/23 0500  Weight: 79.8 kg 81 kg 84.9 kg     Physical Exam: General: No acute distress  Head: Normocephalic, atraumatic. Moist oral mucosal membranes  Eyes: Anicteric  Neck: Supple  Lungs:  Clear to auscultation, normal effort  Heart: S1S2 no rubs  Abdomen:  Soft, nontender,  bowel sounds present  Extremities: Trace peripheral edema.  Neurologic: Awake, alert, following commands  Skin: No acute rash  Access: Left femoral temporary dialysis catheter    Lab results: Basic Metabolic Panel: Recent Labs  Lab 03/31/23 1751 03/31/23 1752 03/31/23 2030 03/31/23 2308 04/01/23 0406  NA 140  --   --  142 138  K 4.4  --   --  3.8 3.7  CL 96*  --   --  99 98  CO2 <7*  --   --  16* 24  GLUCOSE 115*  --   --  103* 132*  BUN 66*  --   --  49*  36*  CREATININE 6.42*  --   --  4.39* 3.14*  CALCIUM 8.2*  --   --  7.7* 8.1*  MG  --  1.1* 1.1*  --  2.5*  PHOS  --  7.8*  --   --  2.0*    Liver Function Tests: Recent Labs  Lab 03/31/23 1103 04/01/23 0406  AST 52* 26  ALT 35 21  ALKPHOS 61 38  BILITOT 1.2 1.1  PROT 8.5* 5.8*  ALBUMIN 4.1 2.9*   Recent Labs  Lab 03/31/23 1103 03/31/23 1449 04/01/23 0406  LIPASE 240*  --  198*  AMYLASE  --  411*  --    No results for input(s): "AMMONIA" in the last 168 hours.  CBC: Recent Labs  Lab 03/31/23 1005 03/31/23 1451 04/01/23 0406  WBC 15.0* 17.8* 11.5*  NEUTROABS 13.5*  --   --   HGB 17.3* 14.4 12.9  HCT 57.9* 45.4 38.0  MCV 94.3 89.9 84.4  PLT 316 303 192    Cardiac Enzymes: No results for input(s): "CKTOTAL", "CKMB", "CKMBINDEX", "TROPONINI" in the last 168 hours.  BNP: Invalid input(s): "POCBNP"  CBG: Recent Labs  Lab 04/01/23 0303 04/01/23 0404 04/01/23 0503 04/01/23 0607 04/01/23 0703  GLUCAP 113* 123* 121* 123* 153*    Microbiology: Results for orders placed or performed during the hospital encounter of 03/31/23  Resp panel by RT-PCR (RSV, Flu A&B, Covid) Anterior Nasal Swab     Status: None   Collection Time: 03/31/23 10:06 AM   Specimen: Anterior Nasal Swab  Result Value Ref Range Status   SARS Coronavirus 2 by RT PCR NEGATIVE NEGATIVE Final    Comment: (NOTE) SARS-CoV-2 target nucleic acids are NOT DETECTED.  The SARS-CoV-2 RNA is generally detectable in upper respiratory specimens during the acute phase of infection. The lowest concentration of SARS-CoV-2 viral copies this assay can detect is 138 copies/mL. A negative result does not preclude SARS-Cov-2 infection and should not be used as the sole basis for treatment or other patient management decisions. A negative result may occur with  improper specimen collection/handling, submission of specimen other than nasopharyngeal swab, presence of viral mutation(s) within the areas targeted  by this assay, and inadequate number of viral copies(<138 copies/mL). A negative result must be combined with clinical observations, patient history, and epidemiological information. The expected result is Negative.  Fact Sheet for Patients:  BloggerCourse.com  Fact Sheet for Healthcare Providers:  SeriousBroker.it  This test is no t yet approved or cleared by the [place] FDA and  has been authorized for detection and/or diagnosis of SARS-CoV-2 by FDA under an Emergency Use Authorization (EUA). This EUA will remain  in effect (meaning this test can be used) for the duration of the COVID-19 declaration under Section 564(b)(1) of the Act, 21 U.S.C.section 360bbb-3(b)(1), unless the authorization is terminated  or revoked sooner.       Influenza A by PCR NEGATIVE NEGATIVE Final   Influenza B by PCR NEGATIVE NEGATIVE Final    Comment: (NOTE) The Xpert Xpress SARS-CoV-2/FLU/RSV plus assay is intended as an aid in the diagnosis of influenza from Nasopharyngeal swab specimens and should not be used as a sole basis for treatment. Nasal washings and aspirates are unacceptable for Xpert Xpress SARS-CoV-2/FLU/RSV testing.  Fact Sheet for Patients: BloggerCourse.com  Fact Sheet for Healthcare Providers: SeriousBroker.it  This test is not yet approved or cleared by the [place] FDA and has been authorized for detection and/or diagnosis of SARS-CoV-2 by FDA under an Emergency Use Authorization (EUA). This EUA will remain in effect (meaning this test can be used) for the duration of the COVID-19 declaration under Section 564(b)(1) of the Act, 21 U.S.C. section 360bbb-3(b)(1), unless the authorization is terminated or revoked.     Resp Syncytial Virus by PCR NEGATIVE NEGATIVE Final    Comment: (NOTE) Fact Sheet for Patients: BloggerCourse.com  Fact  Sheet for Healthcare Providers: SeriousBroker.it  This test is not yet approved or cleared by the [place] FDA and has been authorized for detection and/or diagnosis of SARS-CoV-2 by FDA under an Emergency Use Authorization (EUA). This EUA will remain in effect (meaning this test can be used) for the duration of the COVID-19 declaration under Section 564(b)(1) of the Act, 21 U.S.C. section 360bbb-3(b)(1), unless the authorization is terminated or revoked.  Performed at [facility], [address] [place], [place] [address]   MRSA Next Gen by PCR, Nasal     Status: Abnormal   Collection Time: 03/31/23  2:23 PM   Specimen: Nasal Mucosa; Nasal Swab  Result Value Ref Range Status   MRSA by PCR Next Gen DETECTED (A) NOT DETECTED Final    Comment: RESULT CALLED TO, READ BACK BY AND VERIFIED WITH: CHARLIE CORN 03/31/23 1546 KLW (NOTE) The GeneXpert MRSA Assay (FDA approved for NASAL specimens only), is one component of a comprehensive MRSA colonization surveillance program. It is not intended to diagnose MRSA infection nor to guide or monitor treatment for MRSA infections. Test performance is not FDA approved in patients less than 65 years old. Performed at [facility], [address] [place], [place] [address]   Blood culture (routine x 2)     Status: None (Preliminary result)   Collection Time: 03/31/23  2:49 PM   Specimen: BLOOD  Result Value Ref Range Status   Specimen Description BLOOD LEFT ANTECUBITAL  Final   Special Requests   Final    BOTTLES DRAWN AEROBIC AND ANAEROBIC Blood Culture results may not be optimal due to an inadequate volume of blood received in culture bottles   Culture   Final    NO GROWTH < 24 HOURS Performed at [facility], [address], [place], [place] [address]    Report Status PENDING  Incomplete  Blood culture (routine x 2)     Status: None (Preliminary result)   Collection Time:  03/31/23  3:01 PM   Specimen: BLOOD  Result Value Ref Range Status   Specimen Description BLOOD BLOOD LEFT HAND  Final   Special Requests   Final    BOTTLES DRAWN AEROBIC ONLY Blood Culture results may not be optimal due to an inadequate volume of blood received in culture bottles   Culture   Final    NO GROWTH < 24 HOURS Performed at [facility], [address] [place],  [place] [address]    Report Status PENDING  Incomplete    Coagulation Studies: No results for input(s): "LABPROT", "INR" in the last 72 hours.  Urinalysis: Recent Labs    03/31/23 1005  COLORURINE YELLOW*  LABSPEC 1.017  PHURINE 5.0  GLUCOSEU 50*  HGBUR MODERATE*  BILIRUBINUR NEGATIVE  KETONESUR 5*  PROTEINUR >=300*  NITRITE NEGATIVE  LEUKOCYTESUR SMALL*      Imaging: CT HEAD WO CONTRAST ( )  Result Date: 03/31/2023 CLINICAL DATA:  Mental status change, unknown cause. EXAM: CT HEAD WITHOUT CONTRAST TECHNIQUE: Contiguous axial images were obtained from the base of the skull through the vertex without intravenous contrast. RADIATION DOSE REDUCTION: This exam was performed according to the departmental dose-optimization program which includes automated exposure control, adjustment of the mA and/or kV according to patient size and/or use of iterative reconstruction technique. COMPARISON:  CT scan head report from 03/01/2019. Images were not available for review. FINDINGS: Brain: No evidence of acute infarction, hemorrhage, hydrocephalus, extra-axial collection or mass lesion/mass effect. Areas of encephalomalacia noted involving the left external capsule/left basal ganglia with associated ex vacuo dilation of frontal horn of left lateral ventricle. There is additional small lacunar infarction in the right parietal lobe. There is bilateral periventricular hypodensity, which is non-specific but most likely seen in the settings of microvascular ischemic changes. Mild in extent. Vascular: No hyperdense  vessel or unexpected calcification. Intracranial arteriosclerosis. Skull: Normal. Negative for fracture or focal lesion. Sinuses/Orbits: No acute finding. Other: Bilateral mastoid air cells are clear. IMPRESSION: 1. No acute intracranial abnormality. 2. 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 3. Mild microvascular ischemic changes. Electronically Signed   By: Jules Schick M.D.   On: 03/31/2023 15:12   CT ABDOMEN PELVIS WO CONTRAST  Result Date: 03/31/2023 CLINICAL DATA:  Abdominal pain, acute, nonlocalized. History of gynecological carcinoma. * Tracking Code: BO * EXAM: CT ABDOMEN AND PELVIS WITHOUT CONTRAST TECHNIQUE: Multidetector CT imaging of the abdomen and pelvis was performed following the standard protocol without IV contrast. RADIATION DOSE REDUCTION: This exam was performed according to the departmental dose-optimization program which includes automated exposure control, adjustment of the mA and/or kV according to patient size and/or use of iterative reconstruction technique. COMPARISON:  CT scan abdomen and pelvis from 09/23/2010 and PET-CT scan from 01/16/2020. FINDINGS: Examination is limited due to lack of oral/intravenous contrast and patient's motion during data acquisition. Lower chest: Redemonstration of a well-circumscribed 1 point 4 x 1.6 cm solid noncalcified nodule in the right lung lower lobe. The nodule is present since the prior study dating back to February 2012 and exhibits mild interval growth. The nodule was not FDG avid on the prior PET-CT scan. The lung bases are otherwise clear. No pleural effusion. The heart is normal in size. No pericardial effusion. Hepatobiliary: The liver is normal in size. Non-cirrhotic configuration. No suspicious mass. No intrahepatic or extrahepatic bile duct dilation. There are layering hyperattenuating areas in the gallbladder, which may represent concentrated bile versus sludge. Normal gallbladder wall  thickness. No pericholecystic inflammatory changes. Pancreas: Unremarkable. No pancreatic ductal dilatation or surrounding inflammatory changes. Spleen: Within normal limits. No focal lesion. Adrenals/Urinary Tract: Adrenal glands are unremarkable. No suspicious renal mass. There is a partially exophytic 1.5 x 1.6 cm cyst arising from the right kidney interpolar region, laterally. No hydronephrosis. No renal or ureteric calculi. Urinary bladder is under distended secondary to Foley catheter, precluding optimal assessment. However, no large mass or stones identified. No perivesical fat stranding. Stomach/Bowel: No  disproportionate dilation of the small or large bowel loops. No evidence of abnormal bowel wall thickening or inflammatory changes. The appendix is unremarkable. Vascular/Lymphatic: No ascites or pneumoperitoneum. No abdominal or pelvic lymphadenopathy, by size criteria. No aneurysmal dilation of the major abdominal arteries. There are mild peripheral atherosclerotic vascular calcifications of the aorta and its major branches. Reproductive: The uterus is surgically absent. No large adnexal mass. Other: There is a small to medium periumbilical hernia containing fat and unobstructed distal small bowel loop. The soft tissues and abdominal wall are otherwise unremarkable. Musculoskeletal: No suspicious osseous lesions. There are mild - moderate multilevel degenerative changes in the visualized spine. IMPRESSION: 1. Suboptimal exam. 2. No acute inflammatory process identified within the abdomen or pelvis. No discrete metastatic disease identified. 3. Multiple other nonacute observations, as described above. Aortic Atherosclerosis (ICD10-I70.0). Electronically Signed   By: Jules Schick M.D.   On: 03/31/2023 15:04   DG Chest Portable 1 View  Result Date: 03/31/2023 CLINICAL DATA:  SOB EXAM: PORTABLE CHEST 1 VIEW COMPARISON:  CT chest may 8, 21. FINDINGS: Right lower lobe pulmonary nodule. No consolidation.  Enlarged cardiac silhouette. No visible pleural effusions or pneumothorax. Right proximal humerus fixation. IMPRESSION: 1. Right lower lobe pulmonary nodule, seen on prior CT chest. 2. No consolidation. Electronically Signed   By: Feliberto Harts M.D.   On: 03/31/2023 12:53     Assessment & Plan: Pt is a 57 y.o. female with a PMHx of diabetes mellitus type 2, seizure disorder, hypertension, dysrhythmia, history of ovarian cancer, who was admitted to [facility] on 03/31/2023 for evaluation of nausea, vomiting, diarrhea, and abdominal pain.  Symptoms began earlier in the week on 03/30/2023  1.  Acute kidney injury/proteinuria.  Baseline creatinine was 0.76 on 02/19/2023.  Patient now with significant acute kidney injury with proteinuria.  She had a UA on 02/06/2023 which only showed trace proteinuria.  CT scan abdomen pelvis was negative for hydronephrosis.  Patient has been started on CRRT.  Hemoglobin remained CRRT with current parameters for now.  Proceed with additional workup including SPEP, UPEP, ANA, ANCA antibodies, GBM antibodies.  2.  Severe acute metabolic acidosis.  Contributions from uremia, Jardiance, and metformin possibly.  Jardiance and metformin held.  CRRT has been started.  pH up to 7.27.  Continue CRRT for now.  3.  Thanks for consultation.

## 2023-04-01 NOTE — Evaluation (Signed)
Clinical/Bedside Swallow Evaluation Patient Details  Name: Sarah Lane MRN: [id number] Date of Birth: 1965/10/30  Today's Date: 04/01/2023 Time: SLP Start Time (ACUTE ONLY): 0910 SLP Stop Time (ACUTE ONLY): 0925 SLP Time Calculation (min) (ACUTE ONLY): 15 min  Past Medical History:  Past Medical History:  Diagnosis Date   Asthma    childhood asthma   Cancer (HCC)    ovarian cancer   Diabetes mellitus without complication (HCC)    Dysrhythmia    Family history of adverse reaction to anesthesia    Grand father had facial swelling   Hypertension    Seizures (HCC)    petit mals at age 23 treated with medications. Last one age 59   Past Surgical History:  Past Surgical History:  Procedure Laterality Date   ABDOMINAL HYSTERECTOMY  2011   ovarian cancer   IRRIGATION AND DEBRIDEMENT ABDOMEN N/A 12/31/2014   Procedure: IRRIGATION AND DEBRIDEMENT ABDOMINAL WALL ABSCESS;  Surgeon: Luretha Murphy, MD;  Location: [facility];  Service: General;  Laterality: N/A;   HPI:  PT is a 57 yo female who She presented to [facility] ER on 08/21 via EMS with c/o nausea/vomiting/diarrhea/abdominal pain with inability to tolerate po's. Onset of symptoms the night of 08/20. Pt with a PMH of type II diabetes mellitus, seizures, stroke, HTN, dysrhythmia, and ovarian cancer. Head CT on admission, "1. No acute intracranial abnormality.  2. 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  3. Mild microvascular ischemic changes." CXR on admission, "1. Right lower lobe pulmonary nodule, seen on prior CT chest.  2. No consolidation." Abdominal CT on admission, "1. Suboptimal exam.  2. No acute inflammatory process identified within the abdomen or  pelvis. No discrete metastatic disease identified.  3. Multiple other nonacute observations, as described above."    Assessment / Plan / Recommendation  Clinical Impression  Pt seen for clinical swallowing evaluation. Pt alert and cooperative. Dysfluent  speech appreciated.Endorsed trouble swallowing "sometimes," pt unable to elaborate. ?baseline cognitive-linguistic deficits given recent stroke. Pt on 2L/min O2 via [place]. Congested cough appreciated x1 prior to POs during repositoining efforts. Baseline cough was reproduced on today's evaluation. Pt presents with a mild oral dysphagia c/b prolonged mastication of solids likely due to missing/broken dentition. Pt with preference to masticate solids on the L side of mouth due to several missing teeth on R. Pharyngeal swallow appeared [facility] per clinical assessment. No overt s/sx pharyngeal dysphagia noted across trials. Recommend initiation of a mech soft diet with thin liquids with safe swallowing strategies/aspiration precautoins as outlined below. SLP to f/u per POC for diet tolerance. SLP Visit Diagnosis: Dysphagia, oral phase (R13.11)    Aspiration Risk  Mild aspiration risk    Diet Recommendation Dysphagia 3 (Mech soft);Thin liquid    Medication Administration: Whole meds with puree (vs crushed) Compensations: Small sips/bites;Slow rate Postural Changes: Seated upright at 90 degrees;Remain upright for at least 30 minutes after po intake    Other  Recommendations Oral Care Recommendations: Oral care QID (set up)    Recommendations for follow up therapy are one component of a multi-disciplinary discharge planning process, led by the attending physician.  Recommendations may be updated based on patient status, additional functional criteria and insurance authorization.  Follow up Recommendations  (TBD)         Functional Status Assessment Patient has had a recent decline in their functional status and demonstrates the ability to make significant improvements in function in a reasonable and predictable amount of time.  Frequency and Duration min 2x/week  2 weeks       Prognosis Prognosis for improved oropharyngeal function: Good      Swallow Study   General Date of Onset: 03/31/23 HPI: PT  is a 57 yo female who She presented to [facility] ER on 08/21 via EMS with c/o nausea/vomiting/diarrhea/abdominal pain with inability to tolerate po's. Onset of symptoms the night of 08/20. Pt with a PMH of type II diabetes mellitus, seizures, stroke, HTN, dysrhythmia, and ovarian cancer. Head CT on admission, "1. No acute intracranial abnormality.  2. 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  3. Mild microvascular ischemic changes." CXR on admission, "1. Right lower lobe pulmonary nodule, seen on prior CT chest.  2. No consolidation." Abdominal CT on admission, "1. Suboptimal exam.  2. No acute inflammatory process identified within the abdomen or  pelvis. No discrete metastatic disease identified.  3. Multiple other nonacute observations, as described above." Type of Study: Bedside Swallow Evaluation Previous Swallow Assessment: none Diet Prior to this Study: NPO Temperature Spikes Noted: Yes (99.2) Respiratory Status: Nasal cannula History of Recent Intubation: No Behavior/Cognition: Alert;Cooperative;Pleasant mood Oral Cavity Assessment: Dry Oral Care Completed by SLP: Yes Oral Cavity - Dentition: Poor condition;Missing dentition Vision: Functional for self-feeding Self-Feeding Abilities: Able to feed self Patient Positioning: Upright in bed Baseline Vocal Quality: Normal Volitional Cough: Strong Volitional Swallow: Able to elicit    Oral/Motor/Sensory Function Overall Oral Motor/Sensory Function: Mild impairment Facial ROM: Reduced right (mild flattening of R nasolabial fold vs natural asymmetry)   Ice Chips Ice chips: Within functional limits Presentation: Self Fed;Spoon   Thin Liquid Thin Liquid: Within functional limits Presentation: Self Fed;Cup;Straw    Nectar Thick Nectar Thick Liquid: Not tested   Honey Thick Honey Thick Liquid: Not tested   Puree Puree: Within functional limits Presentation: Self Fed;Spoon   Solid     Solid:  Impaired Presentation: Self Fed Oral Phase Impairments: Impaired mastication Oral Phase Functional Implications: Impaired mastication Pharyngeal Phase Impairments:  (WFL)     Clyde Canterbury, M.S., CCC-SLP Speech-Language Pathologist  [facility] [phone] (ASCOM)  Alessandra Bevels Dilana Mcphie 04/01/2023,9:46 AM

## 2023-04-02 ENCOUNTER — Encounter: Payer: Self-pay | Admitting: Internal Medicine

## 2023-04-02 DIAGNOSIS — R7989 Other specified abnormal findings of blood chemistry: Secondary | ICD-10-CM

## 2023-04-02 DIAGNOSIS — I5022 Chronic systolic (congestive) heart failure: Secondary | ICD-10-CM | POA: Diagnosis not present

## 2023-04-02 LAB — T3, FREE: T3, Free: 1.8 pg/mL — ABNORMAL LOW (ref 2.0–4.4)

## 2023-04-02 LAB — URINE CULTURE: Culture: NO GROWTH

## 2023-04-02 LAB — BASIC METABOLIC PANEL
Anion gap: 13 (ref 5–15)
BUN: 15 mg/dL (ref 6–20)
CO2: 24 mmol/L (ref 22–32)
Calcium: 7.9 mg/dL — ABNORMAL LOW (ref 8.9–10.3)
Chloride: 102 mmol/L (ref 98–111)
Creatinine, Ser: 1.92 mg/dL — ABNORMAL HIGH (ref 0.44–1.00)
GFR, Estimated: 30 mL/min — ABNORMAL LOW (ref >60)
Glucose, Bld: 108 mg/dL — ABNORMAL HIGH (ref 70–99)
Potassium: 3.8 mmol/L (ref 3.5–5.1)
Sodium: 139 mmol/L (ref 135–145)

## 2023-04-02 LAB — HEAVY METALS, BLOOD
Arsenic: 2 ug/L (ref 0–9)
Lead: 1 ug/dL (ref 0.0–3.4)
Mercury: <1 ug/L (ref 0.0–14.9)

## 2023-04-02 LAB — C3 COMPLEMENT: C3 Complement: 71 mg/dL — ABNORMAL LOW (ref 82–167)

## 2023-04-02 LAB — ANCA PROFILE
Anti-MPO Antibodies: <0.2 U (ref 0.0–0.9)
Anti-PR3 Antibodies: <0.2 U (ref 0.0–0.9)
Atypical P-ANCA titer: <1:20 {titer}
C-ANCA: <1:20 {titer}
P-ANCA: <1:20 {titer}

## 2023-04-02 LAB — PHOSPHORUS: Phosphorus: 1.9 mg/dL — ABNORMAL LOW (ref 2.5–4.6)

## 2023-04-02 LAB — BLOOD GAS, VENOUS
Acid-base deficit: 20.6 mmol/L — ABNORMAL HIGH (ref 0.0–2.0)
Bicarbonate: 8 mmol/L — ABNORMAL LOW (ref 20.0–28.0)
O2 Saturation: 50.5 %
Patient temperature: 37
pCO2, Ven: 27 mmHg — ABNORMAL LOW (ref 44–60)
pH, Ven: 7.08 — CL (ref 7.25–7.43)

## 2023-04-02 LAB — CBC WITH DIFFERENTIAL/PLATELET
Abs Immature Granulocytes: 0.03 10*3/uL (ref 0.00–0.07)
Basophils Absolute: 0 10*3/uL (ref 0.0–0.1)
Basophils Relative: 0 %
Eosinophils Absolute: 0.2 10*3/uL (ref 0.0–0.5)
Eosinophils Relative: 3 %
HCT: 33.9 % — ABNORMAL LOW (ref 36.0–46.0)
Hemoglobin: 11.5 g/dL — ABNORMAL LOW (ref 12.0–15.0)
Immature Granulocytes: 0 %
Lymphocytes Relative: 21 %
Lymphs Abs: 1.5 10*3/uL (ref 0.7–4.0)
MCH: 28.6 pg (ref 26.0–34.0)
MCHC: 33.9 g/dL (ref 30.0–36.0)
MCV: 84.3 fL (ref 80.0–100.0)
Monocytes Absolute: 0.6 10*3/uL (ref 0.1–1.0)
Monocytes Relative: 8 %
Neutro Abs: 4.8 10*3/uL (ref 1.7–7.7)
Neutrophils Relative %: 68 %
Platelets: 126 10*3/uL — ABNORMAL LOW (ref 150–400)
RBC: 4.02 MIL/uL (ref 3.87–5.11)
RDW: 13.5 % (ref 11.5–15.5)
WBC: 7.1 10*3/uL (ref 4.0–10.5)
nRBC: 0 % (ref 0.0–0.2)

## 2023-04-02 LAB — LACTIC ACID, PLASMA: Lactic Acid, Venous: 1.6 mmol/L (ref 0.5–1.9)

## 2023-04-02 LAB — GLUCOSE, CAPILLARY
Glucose-Capillary: 114 mg/dL — ABNORMAL HIGH (ref 70–99)
Glucose-Capillary: 127 mg/dL — ABNORMAL HIGH (ref 70–99)
Glucose-Capillary: 149 mg/dL — ABNORMAL HIGH (ref 70–99)
Glucose-Capillary: 135 mg/dL — ABNORMAL HIGH (ref 70–99)
Glucose-Capillary: 119 mg/dL — ABNORMAL HIGH (ref 70–99)

## 2023-04-02 LAB — PROTEIN / CREATININE RATIO, URINE
Creatinine, Urine: 27 mg/dL
Protein Creatinine Ratio: 1.52 mg/mg{Cre} — ABNORMAL HIGH (ref 0.00–0.15)
Total Protein, Urine: 41 mg/dL

## 2023-04-02 LAB — GLOMERULAR BASEMENT MEMBRANE ANTIBODIES: GBM Ab: <0.2 units (ref 0.0–0.9)

## 2023-04-02 LAB — ANA W/REFLEX IF POSITIVE: Anti Nuclear Antibody (ANA): NEGATIVE

## 2023-04-02 LAB — MAGNESIUM: Magnesium: 2.2 mg/dL (ref 1.7–2.4)

## 2023-04-02 LAB — C4 COMPLEMENT: Complement C4, Body Fluid: 26 mg/dL (ref 12–38)

## 2023-04-02 MED ORDER — LOPERAMIDE HCL 2 MG PO CAPS
2.0000 mg | ORAL_CAPSULE | Freq: Four times a day (QID) | ORAL | Status: DC | PRN
  Filled 2023-04-02: qty 1

## 2023-04-02 MED ORDER — MIDODRINE HCL 5 MG PO TABS
2.5000 mg | ORAL_TABLET | Freq: Two times a day (BID) | ORAL | Status: DC
  Administered 2023-04-03: 2.5 mg via ORAL
  Filled 2023-04-02 (×2): qty 1

## 2023-04-02 MED ORDER — ORAL CARE MOUTH RINSE: 15.0000 mL | OROMUCOSAL | Status: DC | PRN

## 2023-04-02 MED ORDER — IPRATROPIUM-ALBUTEROL 0.5-2.5 (3) MG/3ML IN SOLN: 3.0000 mL | Freq: Four times a day (QID) | RESPIRATORY_TRACT | Status: DC | PRN

## 2023-04-02 MED ORDER — K PHOS MONO-SOD PHOS DI & MONO 155-852-130 MG PO TABS
500.0000 mg | ORAL_TABLET | ORAL | Status: AC
  Administered 2023-04-02 (×2): 500 mg via ORAL
  Filled 2023-04-02 (×2): qty 2

## 2023-04-02 MED ORDER — ONDANSETRON HCL 4 MG/2ML IJ SOLN
INTRAMUSCULAR | Status: AC
  Administered 2023-04-02: 4 mg via INTRAVENOUS
  Filled 2023-04-02: qty 2

## 2023-04-02 MED ORDER — FAMOTIDINE 20 MG PO TABS
20.0000 mg | ORAL_TABLET | Freq: Every day | ORAL | Status: DC
  Administered 2023-04-03 – 2023-04-07 (×5): 20 mg via ORAL
  Filled 2023-04-02 (×5): qty 1

## 2023-04-02 MED ORDER — IPRATROPIUM-ALBUTEROL 0.5-2.5 (3) MG/3ML IN SOLN
3.0000 mL | RESPIRATORY_TRACT | Status: DC
  Administered 2023-04-02: 3 mL via RESPIRATORY_TRACT
  Filled 2023-04-02: qty 3

## 2023-04-02 MED ORDER — IPRATROPIUM-ALBUTEROL 0.5-2.5 (3) MG/3ML IN SOLN
3.0000 mL | Freq: Two times a day (BID) | RESPIRATORY_TRACT | Status: DC
  Administered 2023-04-02: 3 mL via RESPIRATORY_TRACT
  Filled 2023-04-02: qty 3

## 2023-04-02 MED ORDER — CHLORHEXIDINE GLUCONATE CLOTH 2 % EX PADS
6.0000 | MEDICATED_PAD | Freq: Every day | CUTANEOUS | Status: DC
  Administered 2023-04-02 – 2023-04-03 (×2): 6 via TOPICAL

## 2023-04-02 MED ORDER — MIDODRINE HCL 5 MG PO TABS: 5.0000 mg | ORAL_TABLET | Freq: Two times a day (BID) | ORAL | Status: DC

## 2023-04-02 MED ORDER — ASPIRIN 81 MG PO CHEW
81.0000 mg | CHEWABLE_TABLET | Freq: Every day | ORAL | Status: DC
  Administered 2023-04-02 – 2023-04-07 (×6): 81 mg via ORAL
  Filled 2023-04-02 (×6): qty 1

## 2023-04-02 MED ORDER — ONDANSETRON HCL 4 MG/2ML IJ SOLN: 4.0000 mg | Freq: Four times a day (QID) | INTRAMUSCULAR | Status: DC | PRN

## 2023-04-02 MED ORDER — ATORVASTATIN CALCIUM 20 MG PO TABS
40.0000 mg | ORAL_TABLET | Freq: Every day | ORAL | Status: DC
  Administered 2023-04-02 – 2023-04-07 (×6): 40 mg via ORAL
  Filled 2023-04-02 (×6): qty 2

## 2023-04-02 NOTE — Consult Note (Signed)
Cardiology Consult    Patient ID: Sarah Lane MRN: [id number], DOB/AGE: Apr 29, 1966   Admit date: 03/31/2023 Date of Consult: 04/02/2023  Primary Physician: Care, [facility] Primary Primary Cardiologist: None - new Requesting Provider: Army Chaco, MD  Patient Profile    Sarah Lane is a 57 y.o. female with a history of recent stroke, poorly controlled DM, recently dx cardiomyopathy & HFrEF (EF 15-20% by [facility] echo 01/2023), HTN, PSVT, remote seizures, and RLL pulm nodule, who is being seen today for the evaluation of cardiomyopathy at the request of Dr. Belia Heman.  Past Medical History   Past Medical History:  Diagnosis Date   Aortic atherosclerosis (HCC)    a. 03/2023 noted on CT.   Asthma    childhood asthma   Cancer (HCC)    ovarian cancer   Cardiomyopathy (HCC)    a. 01/2023 Echo [facility]): EF 15-20%, impaired relaxation, mildly to mod dil RV w/ mild RV dysfxn, mod PH, BAE; b. 03/2023 Echo: EF 30-35%, glob HK, GrI DD, mod reduced RV fxn, mod enlarged RV, RVSP 42.43mmHg, mild MR, mild-mod TR.   Chronic HFrEF (heart failure with reduced ejection fraction) (HCC)    a. Dx 01/2023-->[facility] Echo: EF 15-20%; b. 03/2023 Echo: EF 30-35%.   Diabetes (HCC)    Diabetes mellitus without complication (HCC)    Expressive aphasia    a. 01/2023 L MCA occlusive stroke s/p thrombectomy.   Family history of adverse reaction to anesthesia    Grand father had facial swelling   H/O ischemic left MCA stroke    a. 01/2023 s/p admission @ [facility]. CTA Head w/ M1 occlusion of L MCA, possible high-grade stenosis of distal A3 segment. S/p thrombectomy.   Hypertension    PSVT (paroxysmal supraventricular tachycardia)    Right lower lobe pulmonary nodule    a. 03/2023 CT Chest - 14mm - stable.   Seizures (HCC)    petit mals at age 23 treated with medications. Last one age 26    Past Surgical History:  Procedure Laterality Date   ABDOMINAL HYSTERECTOMY  2011   ovarian cancer   IRRIGATION AND DEBRIDEMENT ABDOMEN N/A 12/31/2014    Procedure: IRRIGATION AND DEBRIDEMENT ABDOMINAL WALL ABSCESS;  Surgeon: Luretha Murphy, MD;  Location: [facility] ORS;  Service: General;  Laterality: N/A;     Allergies  Allergies  Allergen Reactions   Codeine Nausea And Vomiting   Penicillins     Family history of allergry.     Ibuprofen Nausea And Vomiting    History of Present Illness    57 y.o. female with a history of recent stroke, poorly controlled DM x 16 yrs (dx 2008), recently dx cardiomyopathy & HFrEF (EF 15-20% by [facility] echo 01/2023), HTN, PSVT, remote seizures, and RLL pulm nodule.  She notes that her DM has been poorly controlled since diagnosis.  She was admitted to [facility] in 01/26/2023 in the setting of L MCA occlusion and stroke, which was treated w/ thrombectomy.  Following thrombectomy, she had ongoing expressive aphasia.  She also required treatment for UTI.  Echo showed LV dysfxn w/ an EF of 15-20% and impaired relaxation, mildly to mod dil RV w/ mild RV dysfxn, mod PH, and bi-atrial enlargement.  She was seen by cardiology and placed on jardiance 10 mg daily, metoprolol 12.5 mg BID, Lasix 40 BID, asa 81 daily, and atorvastatin 80 daily.  After prolonged hospitalization, she was discharged home on 02/20/2023 (refused SNF).  Following d/c, pt says that RLE strength improved some.  Aphasia has  persisted.  She was not having any significant wt gain, edema, dyspnea, or chest pain.  On the evening of 8/20, she began experiencing nausea, vomiting, diarrhea, and abd pain.  She notes that her appetite had been poor leading up to this and that her blood sugars had been running high.  Due to ongoing symptoms, she called EMS and was found to be hypoglycemic in the 30's.  She was treated w/ D10W bolus w/ improvement in CBG to 86.  She was taken to the [facility] ED where, she was initially confused, weak, and dyspneic.  Labs notable for WBC 15.0, H/H 17.3/57.9, BUN/Creat 73/7.31 (21/0.76 respectively on 02/19/2023), gluc 152, lipase 240, AST 52, ALT 35, lactic  acid >9.0, HsTrop 213  295  495  475.  ECG initially showed SVT @ 160, though this later converted to sinus tach/sinus rhythm late in the evening on 8/21.  CT Abd/Pelvis showed no acute findings (Ao atherosclerosis noted).  CT head showed old L ext capsul/L basal ganglia and R parietal lobe infarcts w/o acute findings.  She was admitted and has been managed for euglycemic DKA felt to be 2/2 SGLT2i, metabolic encephalopathy, sepsis 2/2 acute pancreatitis, severe lactic/metabolic acidosis, and AKI.  She was seen by nephrology and initially required CRRT.  Creat has since trended down - 1.92 this AM.  Lactic acid now nl @ 1.6.  We have been asked to see [name] due to repeat echo showing ongoing LV dysfxn w/ EF of 30-35% on 8/22.  Ms. Hearld notes significant improvement in clinical status since admission and compliance w/ meds @ home prior to arrival.  She denies chest pain or dyspnea.  She was aware of her cardiomyopathy dx and need for cardiology eval.  Inpatient Medications     Chlorhexidine Gluconate Cloth  6 each Topical QHS   feeding supplement  237 mL Oral TID BM   heparin  5,000 Units Subcutaneous Q8H   insulin aspart  0-5 Units Subcutaneous QHS   insulin aspart  0-9 Units Subcutaneous TID WC   insulin glargine-yfgn  8 Units Subcutaneous Daily   ipratropium-albuterol  3 mL Nebulization BID   midodrine  5 mg Oral TID WC   multivitamin  1 tablet Oral QHS   multivitamin with minerals  1 tablet Oral Daily   mupirocin ointment  1 Application Nasal BID   phosphorus  500 mg Oral Q4H    Family History    Family History  Problem Relation Age of Onset   CAD Father        s/p CABG x 2 and stenting   Parkinson's disease Father    She indicated that her mother is alive. She indicated that her father is alive. She indicated that her sister is alive.   Social History    Social History   Socioeconomic History   Marital status: Widowed    Spouse name: Not on file   Number of children:  Not on file   Years of education: Not on file   Highest education level: Not on file  Occupational History   Not on file  Tobacco Use   Smoking status: Never   Smokeless tobacco: Not on file  Substance and Sexual Activity   Alcohol use: No   Drug use: No   Sexual activity: Never  Other Topics Concern   Not on file  Social History Narrative   Widowed.  Lives in [place] w/ roommate.  Does not routinely exercise.   Social Determinants of  Health   Financial Resource Strain: Low Risk  (07/15/2020)   Received from [facility]   Overall Financial Resource Strain (CARDIA)    Difficulty of Paying Living Expenses: Not very hard  Food Insecurity: No Food Insecurity (03/11/2023)   Received from [facility]   Hunger Vital Sign    Worried About Running Out of Food in the Last Year: Never true    Ran Out of Food in the Last Year: Never true  Transportation Needs: No Transportation Needs (03/11/2023)   Received from [facility] - Transportation    Lack of Transportation (Medical): No    Lack of Transportation (Non-Medical): No  Physical Activity: Insufficiently Active (07/15/2020)   Received from [facility]   Exercise Vital Sign    Days of Exercise per Week: 7 days    Minutes of Exercise per Session: 20 min  Stress: No Stress Concern Present (07/15/2020)   Received from [facility] - Occupational Stress Questionnaire    Feeling of Stress : Only a little  Social Connections: Socially Isolated (07/15/2020)   Received from [facility]   Social Connection and Isolation Panel [NHANES]    Frequency of Communication with Friends and Family: More than three times a week    Frequency of Social Gatherings with Friends and Family: More than three times a week    Attends Religious Services: Never    Database administrator or Organizations: No    Attends Banker Meetings: Never    Marital Status: Widowed   Intimate Partner Violence: Not At Risk (07/15/2020)   Received from [facility]   Humiliation, Afraid, Rape, and Kick questionnaire    Fear of Current or Ex-Partner: No    Emotionally Abused: No    Physically Abused: No    Sexually Abused: No     Review of Systems    General:  No chills, fever, night sweats or weight changes.  Cardiovascular:  No chest pain, dyspnea on exertion, edema, orthopnea, palpitations, paroxysmal nocturnal dyspnea. Dermatological: No rash, lesions/masses Respiratory: No cough, dyspnea Urologic: No hematuria, dysuria Abdominal:   +++ nausea, +++ vomiting, +++ diarrhea on evening of admission.  No bright red blood per rectum, melena, or hematemesis Neurologic:  +++ ongoing expressive aphasia since stroke in June.  +++ ongoing RLE wkns - though maybe sl improved.  No visual changes, changes in mental status. All other systems reviewed and are otherwise negative except as noted above.  Physical Exam    Blood pressure 124/81, pulse 66, temperature (!) 97.3 F (36.3 C), temperature source Oral, resp. rate (!) 22, height 5\' 1"  (1.549 m), weight 86.1 kg, SpO2 95%.  General: Pleasant, NAD Psych: Normal affect. Neuro: Alert and oriented X 3. Moves all extremities spontaneously.  RLE wkns.  Expressive aphasia. HEENT: Normal  Neck: Supple without bruits or JVD. Lungs:  Resp regular and unlabored, diminished breath sounds bilat. Heart: RRR no s3, s4, or murmurs. Abdomen: Soft, non-tender, non-distended, BS + x 4.  Extremities: No clubbing, cyanosis or edema. DP/PT2+, Radials 2+ and equal bilaterally.  Labs    Cardiac Enzymes Recent Labs  Lab 03/31/23 1449 03/31/23 1751 03/31/23 2030 03/31/23 2308  TROPONINIHS 213* 295* 495* 475*     Lab Results  Component Value Date   WBC 7.1 04/02/2023   HGB 11.5 (L) 04/02/2023   HCT 33.9 (L) 04/02/2023   MCV 84.3 04/02/2023  PLT 126 (L) 04/02/2023    Recent Labs  Lab 04/01/23 0406 04/01/23 1033  04/02/23 0420  NA 138   < > 139  K 3.7   < > 3.8  CL 98   < > 102  CO2 24   < > 24  BUN 36*   < > 15  CREATININE 3.14*   < > 1.92*  CALCIUM 8.1*   < > 7.9*  PROT 5.8*  --   --   BILITOT 1.1  --   --   ALKPHOS 38  --   --   ALT 21  --   --   AST 26  --   --   GLUCOSE 132*   < > 108*   < > = values in this interval not displayed.     Radiology Studies    CT ABDOMEN PELVIS WO CONTRAST  Result Date: 04/01/2023 CLINICAL DATA:  Right lower quadrant pain, elevated lipase, sepsis EXAM: CT ABDOMEN AND PELVIS WITHOUT CONTRAST TECHNIQUE: Multidetector CT imaging of the abdomen and pelvis was performed following the standard protocol without IV contrast. RADIATION DOSE REDUCTION: This exam was performed according to the departmental dose-optimization program which includes automated exposure control, adjustment of the mA and/or kV according to patient size and/or use of iterative reconstruction technique. COMPARISON:  03/31/2023 FINDINGS: Lower chest: Cardiomegaly. Trace bilateral pleural effusions. Bibasilar airspace opacities likely reflect atelectasis. 1.4 cm right lower lobe nodule compared to 1.6 cm previously. Hepatobiliary: No focal hepatic abnormality. Gallbladder unremarkable. Pancreas: No focal abnormality or ductal dilatation. Spleen: No focal abnormality.  Normal size. Adrenals/Urinary Tract: Adrenal glands normal. No renal or adrenal stones. No hydronephrosis. Scattered low-density lesions within the kidneys most compatible with cysts. No follow-up imaging recommended. Foley catheter within the bladder, carcinoma. Stomach/Bowel: Stomach, large and small bowel grossly unremarkable. Umbilical hernia containing small bowel loops. No bowel obstruction. Vascular/Lymphatic: Aortic atherosclerosis. No evidence of aneurysm or adenopathy. Reproductive: Prior hysterectomy.  No adnexal masses. Other: Trace free fluid in the cul-de-sac.  No free air. Musculoskeletal: No acute bony abnormality. IMPRESSION:  Trace bilateral pleural effusions. 1.4 cm right lower lobe pulmonary nodule, stable since prior study. This nodule has been present dating back to 2012 and was not avid on FDG imaging. No acute findings in the abdomen or pelvis. Umbilical hernia containing small bowel loops. No bowel obstruction. Aortic atherosclerosis. Electronically Signed   By: Charlett Nose M.D.   On: 04/01/2023 18:12   CT HEAD WO CONTRAST ( )  Result Date: 03/31/2023 CLINICAL DATA:  Mental status change, unknown cause. EXAM: CT HEAD WITHOUT CONTRAST TECHNIQUE: Contiguous axial images were obtained from the base of the skull through the vertex without intravenous contrast. RADIATION DOSE REDUCTION: This exam was performed according to the departmental dose-optimization program which includes automated exposure control, adjustment of the mA and/or kV according to patient size and/or use of iterative reconstruction technique. COMPARISON:  CT scan head report from 03/01/2019. Images were not available for review. FINDINGS: Brain: No evidence of acute infarction, hemorrhage, hydrocephalus, extra-axial collection or mass lesion/mass effect. Areas of encephalomalacia noted involving the left external capsule/left basal ganglia with associated ex vacuo dilation of frontal horn of left lateral ventricle. There is additional small lacunar infarction in the right parietal lobe. There is bilateral periventricular hypodensity, which is non-specific but most likely seen in the settings of microvascular ischemic changes. Mild in extent. Vascular: No hyperdense vessel or unexpected calcification. Intracranial arteriosclerosis. Skull: Normal. Negative for fracture or focal lesion. Sinuses/Orbits:  No acute finding. Other: Bilateral mastoid air cells are clear. IMPRESSION: 1. No acute intracranial abnormality. 2. 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 3. Mild microvascular ischemic changes. Electronically Signed    By: Jules Schick M.D.   On: 03/31/2023 15:12   CT ABDOMEN PELVIS WO CONTRAST  Result Date: 03/31/2023 CLINICAL DATA:  Abdominal pain, acute, nonlocalized. History of gynecological carcinoma. * Tracking Code: BO * EXAM: CT ABDOMEN AND PELVIS WITHOUT CONTRAST TECHNIQUE: Multidetector CT imaging of the abdomen and pelvis was performed following the standard protocol without IV contrast. RADIATION DOSE REDUCTION: This exam was performed according to the departmental dose-optimization program which includes automated exposure control, adjustment of the mA and/or kV according to patient size and/or use of iterative reconstruction technique. COMPARISON:  CT scan abdomen and pelvis from 09/23/2010 and PET-CT scan from 01/16/2020. FINDINGS: Examination is limited due to lack of oral/intravenous contrast and patient's motion during data acquisition. Lower chest: Redemonstration of a well-circumscribed 1 point 4 x 1.6 cm solid noncalcified nodule in the right lung lower lobe. The nodule is present since the prior study dating back to February 2012 and exhibits mild interval growth. The nodule was not FDG avid on the prior PET-CT scan. The lung bases are otherwise clear. No pleural effusion. The heart is normal in size. No pericardial effusion. Hepatobiliary: The liver is normal in size. Non-cirrhotic configuration. No suspicious mass. No intrahepatic or extrahepatic bile duct dilation. There are layering hyperattenuating areas in the gallbladder, which may represent concentrated bile versus sludge. Normal gallbladder wall thickness. No pericholecystic inflammatory changes. Pancreas: Unremarkable. No pancreatic ductal dilatation or surrounding inflammatory changes. Spleen: Within normal limits. No focal lesion. Adrenals/Urinary Tract: Adrenal glands are unremarkable. No suspicious renal mass. There is a partially exophytic 1.5 x 1.6 cm cyst arising from the right kidney interpolar region, laterally. No hydronephrosis. No  renal or ureteric calculi. Urinary bladder is under distended secondary to Foley catheter, precluding optimal assessment. However, no large mass or stones identified. No perivesical fat stranding. Stomach/Bowel: No disproportionate dilation of the small or large bowel loops. No evidence of abnormal bowel wall thickening or inflammatory changes. The appendix is unremarkable. Vascular/Lymphatic: No ascites or pneumoperitoneum. No abdominal or pelvic lymphadenopathy, by size criteria. No aneurysmal dilation of the major abdominal arteries. There are mild peripheral atherosclerotic vascular calcifications of the aorta and its major branches. Reproductive: The uterus is surgically absent. No large adnexal mass. Other: There is a small to medium periumbilical hernia containing fat and unobstructed distal small bowel loop. The soft tissues and abdominal wall are otherwise unremarkable. Musculoskeletal: No suspicious osseous lesions. There are mild - moderate multilevel degenerative changes in the visualized spine. IMPRESSION: 1. Suboptimal exam. 2. No acute inflammatory process identified within the abdomen or pelvis. No discrete metastatic disease identified. 3. Multiple other nonacute observations, as described above. Aortic Atherosclerosis (ICD10-I70.0). Electronically Signed   By: Jules Schick M.D.   On: 03/31/2023 15:04   DG Chest Portable 1 View  Result Date: 03/31/2023 CLINICAL DATA:  SOB EXAM: PORTABLE CHEST 1 VIEW COMPARISON:  CT chest may 8, 21. FINDINGS: Right lower lobe pulmonary nodule. No consolidation. Enlarged cardiac silhouette. No visible pleural effusions or pneumothorax. Right proximal humerus fixation. IMPRESSION: 1. Right lower lobe pulmonary nodule, seen on prior CT chest. 2. No consolidation. Electronically Signed   By: Feliberto Harts M.D.   On: 03/31/2023 12:53    ECG & Cardiac Imaging    8/21 @ 11:37 - sinus  tachycardia, 160, RAD, prior inflat/antsept infarcts - personally  reviewed. 8/23 @ 05:14 - RSR, 85, low voltage w/ baseline artifact. Prior antsept infarct - personally reviewed.  Assessment & Plan    1.  Euglycemic DKA/DM/Metabolic & Lactic Acidosis:  presented w/ n/v/diarrhea and altered mental status in setting of profound lactic acidosis, felt to represent euglycemic DKA in setting of jardiance use as outpt.  Lactic acid now nl.  Diabetes mgmt per CCM.  2.  Cardiomyopathy/Chronic HFrEF:  Echo @ [facility] in 01/2023 notable for newly dx LV dysfxn w/ EF of 15-20%.  She was placed on metop tartrate 12.5 BID, jardiance 10 daily, and lasix 40 BID @ that time w/ rec for outpt cardiology f/u and eventual cath.  [facility] PCP referred to [facility] cardiology but no f/u established yet.  Pt expresses desire to be have w/u through [facility].  She had been doing well @ home w/o chest pain, dyspnea, or edema.  Repeat echo here cont so show LV dysfxn w/ EF of 30-35%, glob HK, mod RV dysfxn w/ RVSP 42.3 mmHg, mild MR, and mild-mod TR.  Pt currently appears euvolemic.  Wean midodrine.  Can consider addition of ? blocker once off midodrine as BP allows.  Avoiding acei/arb/arni/mra in setting of acute renal failure on admission.  SGLT2i d/c'd in setting of euglycemic DKA.  We discussed that she will eventually need R & L heart cath.  Mult risk factors for CAD and known cerebrovascular dzs along w/ Ao atherosclerosis.  Follow creat over the weekend (currently 1.92 w/ prior baseline of 0.76 on 02/19/2023).  If creat improves/normalizes, would look to pursue R & L heart cath early next week.  The patient understands that risks include but are not limited to stroke (1 in 1000), death (1 in 1000), kidney failure [usually temporary] (1 in 500), bleeding (1 in 200), allergic reaction [possibly serious] (1 in 200), and agrees to proceed - pending renal fxn.  Add back ASA (was on @ home post-stroke/thrombectomy).  Resume statin.  3.  Demand Ischemia:  In setting of above, HsTrop elevated @ 213  295  495  475.  See  #2.  4.  Acute renal failure:  in setting of #1.  Creat steadily improving - 1.92 this AM.  Nephrology following.  Avoiding nephrotoxic agents.  5.  Hypotension:  home dose of ? blocker and lasix on hold.  Currently on midodrine.  In light of LV dysfxn, would like to get her off of this.  Pressures 1-teens today.  Will reduce to BID and try and d/c over the next 24-48 hrs.  6.  H/o CVA:  L MCA ischemic stroke in 01/2023 s/p thrombectomy.  Resume ASA and statin.  7.  PSVT:  Pt reports prior h/o palpitations.  On admission, she was in SVT @ 160 and subsequently converted to sinus tachycardia @ 23:13 on 8/21.  No further SVT.  As above, plan for eventual ? blocker as BP allows (once off midodrine).  8.  RLL pulm nodule:  stable @ 14mm.  Risk Assessment/Risk Scores:     TIMI Risk Score for Unstable Angina or Non-ST Elevation MI:   The patient's TIMI risk score is 3, which indicates a 13% risk of all cause mortality, new or recurrent myocardial infarction or need for urgent revascularization in the next 14 days.  New York Heart Association (NYHA) Functional Class NYHA Class II    Signed, Nicolasa Ducking, NP 04/02/2023, 12:33 PM  For questions or updates, please contact  Please consult [email or web address] for contact info under Cardiology/STEMI.

## 2023-04-02 NOTE — Progress Notes (Signed)
PHARMACY CONSULT NOTE  Pharmacy Consult for Electrolyte Monitoring and Replacement   Recent Labs: Potassium (mmol/L)  Date Value  04/02/2023 3.8   Magnesium (mg/dL)  Date Value  09/81/1914 2.2   Calcium (mg/dL)  Date Value  78/29/5621 7.9 (L)   Albumin (g/dL)  Date Value  30/86/5784 2.8 (L)   Phosphorus (mg/dL)  Date Value  69/62/9528 1.9 (L)   Sodium (mmol/L)  Date Value  04/02/2023 139   Corrected Ca: 8.98 mg/dL  Assessment: Sarah Lane is a 57 y.o. female with PMH including DM, seizures, HTN, dysrhythmia, ovarian cancer admitted on 03/31/2023 with  euglycemic DKA, sepsis secondary to acute pancreatitis, severe lactic acidosis, AKI, now on CRRT. Pharmacy consulted to assist with electrolyte monitoring and replacement as indicated.  Goal of Therapy:  Electrolytes within normal limits  Plan:  --Kphos 500mg  PO x 2  --Will recheck all electrolytes tomorrow with AM labs --Pharmacy will continue to follow along  Bettey Costa, PharmD Clinical Pharmacist 04/02/2023 7:56 AM

## 2023-04-02 NOTE — Plan of Care (Signed)

## 2023-04-02 NOTE — Progress Notes (Signed)
[facility]  ROUNDING NOTE   Subjective:   Patient seen sitting up in bed in ICU Alert and oriented with delayed response Attempting to eat breakfast Continues to have shortness of breath with conversation  Patient seen sitting up in chair later in the morning   CRRT in place UOP 1.35L in past 24 hours   Objective:  Vital signs in last 24 hours:  Temp:  [97.3 F (36.3 C)-98.8 F (37.1 C)] 97.3 F (36.3 C) (08/23 1200) Pulse Rate:  [66-92] 66 (08/23 1200) Resp:  [15-23] 22 (08/23 1200) BP: (81-124)/(46-89) 124/81 (08/23 1200) SpO2:  [95 %-99 %] 95 % (08/23 1200) Weight:  [86.1 kg] 86.1 kg (08/23 0457)  Weight change: 6.267 kg Filed Weights   03/31/23 1410 04/01/23 0500 04/02/23 0457  Weight: 81 kg 84.9 kg 86.1 kg    Intake/Output: I/O last 3 completed shifts: In: 5228.7 [P.O.:590; I.V.:3194.1; IV Piggyback:1444.6] Out: 3648 [Urine:1710]   Intake/Output this shift:  Total I/O In: 60 [P.O.:60] Out: 225 [Urine:225]  Physical Exam: General: NAD  Head: Normocephalic, atraumatic. Moist oral mucosal membranes  Eyes: Anicteric  Lungs:  Clear to auscultation  Heart: Regular rate and rhythm  Abdomen:  Soft, nontender  Extremities:  trace peripheral edema.  Neurologic: Nonfocal, moving all four extremities  Skin: No lesions  Access: Lt femoral temp cath    Basic Metabolic Panel: Recent Labs  Lab 03/31/23 1752 03/31/23 2030 03/31/23 2308 04/01/23 0406 04/01/23 1033 04/01/23 1350 04/01/23 1913 04/02/23 0420  NA  --   --    < > 138 137 132* 135 139  K  --   --    < > 3.7 4.4 3.7 3.9 3.8  CL  --   --    < > 98 100 100 99 102  CO2  --   --    < > 24 26 25 23 24   GLUCOSE  --   --    < > 132* 142* 208* 151* 108*  BUN  --   --    < > 36* 33* 29* 26* 15  CREATININE  --   --    < > 3.14* 3.42* 3.02* 2.91* 1.92*  CALCIUM  --   --    < > 8.1* 8.1* 7.7* 7.9* 7.9*  MG 1.1* 1.1*  --  2.5*  --   --   --  2.2  PHOS 7.8*  --   --  2.0*  --   --  2.8 1.9*    < > = values in this interval not displayed.    Liver Function Tests: Recent Labs  Lab 03/31/23 1103 04/01/23 0406 04/01/23 1913  AST 52* 26  --   ALT 35 21  --   ALKPHOS 61 38  --   BILITOT 1.2 1.1  --   PROT 8.5* 5.8*  --   ALBUMIN 4.1 2.9* 2.8*   Recent Labs  Lab 03/31/23 1103 03/31/23 1449 04/01/23 0406 04/01/23 1033  LIPASE 240*  --  198*  --   AMYLASE  --  411*  --  517*   No results for input(s): "AMMONIA" in the last 168 hours.  CBC: Recent Labs  Lab 03/31/23 1005 03/31/23 1451 04/01/23 0406 04/02/23 0420  WBC 15.0* 17.8* 11.5* 7.1  NEUTROABS 13.5*  --   --  4.8  HGB 17.3* 14.4 12.9 11.5*  HCT 57.9* 45.4 38.0 33.9*  MCV 94.3 89.9 84.4 84.3  PLT 316 303 192 126*  Cardiac Enzymes: No results for input(s): "CKTOTAL", "CKMB", "CKMBINDEX", "TROPONINI" in the last 168 hours.  BNP: Invalid input(s): "POCBNP"  CBG: Recent Labs  Lab 04/01/23 1947 04/01/23 2110 04/01/23 2202 04/02/23 0736 04/02/23 1124  GLUCAP 120* 129* 126* 114* 127*    Microbiology: Results for orders placed or performed during the hospital encounter of 03/31/23  Resp panel by RT-PCR (RSV, Flu A&B, Covid) Anterior Nasal Swab     Status: None   Collection Time: 03/31/23 10:06 AM   Specimen: Anterior Nasal Swab  Result Value Ref Range Status   SARS Coronavirus 2 by RT PCR NEGATIVE NEGATIVE Final    Comment: (NOTE) SARS-CoV-2 target nucleic acids are NOT DETECTED.  The SARS-CoV-2 RNA is generally detectable in upper respiratory specimens during the acute phase of infection. The lowest concentration of SARS-CoV-2 viral copies this assay can detect is 138 copies/mL. A negative result does not preclude SARS-Cov-2 infection and should not be used as the sole basis for treatment or other patient management decisions. A negative result may occur with  improper specimen collection/handling, submission of specimen other than nasopharyngeal swab, presence of viral mutation(s) within  the areas targeted by this assay, and inadequate number of viral copies(<138 copies/mL). A negative result must be combined with clinical observations, patient history, and epidemiological information. The expected result is Negative.  Fact Sheet for Patients:  BloggerCourse.com  Fact Sheet for Healthcare Providers:  SeriousBroker.it  This test is no t yet approved or cleared by the [place] FDA and  has been authorized for detection and/or diagnosis of SARS-CoV-2 by FDA under an Emergency Use Authorization (EUA). This EUA will remain  in effect (meaning this test can be used) for the duration of the COVID-19 declaration under Section 564(b)(1) of the Act, 21 U.S.C.section 360bbb-3(b)(1), unless the authorization is terminated  or revoked sooner.       Influenza A by PCR NEGATIVE NEGATIVE Final   Influenza B by PCR NEGATIVE NEGATIVE Final    Comment: (NOTE) The Xpert Xpress SARS-CoV-2/FLU/RSV plus assay is intended as an aid in the diagnosis of influenza from Nasopharyngeal swab specimens and should not be used as a sole basis for treatment. Nasal washings and aspirates are unacceptable for Xpert Xpress SARS-CoV-2/FLU/RSV testing.  Fact Sheet for Patients: BloggerCourse.com  Fact Sheet for Healthcare Providers: SeriousBroker.it  This test is not yet approved or cleared by the [place] FDA and has been authorized for detection and/or diagnosis of SARS-CoV-2 by FDA under an Emergency Use Authorization (EUA). This EUA will remain in effect (meaning this test can be used) for the duration of the COVID-19 declaration under Section 564(b)(1) of the Act, 21 U.S.C. section 360bbb-3(b)(1), unless the authorization is terminated or revoked.     Resp Syncytial Virus by PCR NEGATIVE NEGATIVE Final    Comment: (NOTE) Fact Sheet for  Patients: BloggerCourse.com  Fact Sheet for Healthcare Providers: SeriousBroker.it  This test is not yet approved or cleared by the [place] FDA and has been authorized for detection and/or diagnosis of SARS-CoV-2 by FDA under an Emergency Use Authorization (EUA). This EUA will remain in effect (meaning this test can be used) for the duration of the COVID-19 declaration under Section 564(b)(1) of the Act, 21 U.S.C. section 360bbb-3(b)(1), unless the authorization is terminated or revoked.  Performed at [facility], [address] [place], [place] [address]   MRSA Next Gen by PCR, Nasal     Status: Abnormal   Collection Time: 03/31/23  2:23 PM  Specimen: Nasal Mucosa; Nasal Swab  Result Value Ref Range Status   MRSA by PCR Next Gen DETECTED (A) NOT DETECTED Final    Comment: RESULT CALLED TO, READ BACK BY AND VERIFIED WITH: CHARLIE CORN 03/31/23 1546 KLW (NOTE) The GeneXpert MRSA Assay (FDA approved for NASAL specimens only), is one component of a comprehensive MRSA colonization surveillance program. It is not intended to diagnose MRSA infection nor to guide or monitor treatment for MRSA infections. Test performance is not FDA approved in patients less than 27 years old. Performed at [facility], [address] [place], [place] [address]   Blood culture (routine x 2)     Status: None (Preliminary result)   Collection Time: 03/31/23  2:49 PM   Specimen: BLOOD  Result Value Ref Range Status   Specimen Description BLOOD LEFT ANTECUBITAL  Final   Special Requests   Final    BOTTLES DRAWN AEROBIC AND ANAEROBIC Blood Culture results may not be optimal due to an inadequate volume of blood received in culture bottles   Culture   Final    NO GROWTH 2 DAYS Performed at [facility], [address] [place], [place] [address]    Report Status PENDING  Incomplete  Blood culture (routine x 2)      Status: None (Preliminary result)   Collection Time: 03/31/23  3:01 PM   Specimen: BLOOD  Result Value Ref Range Status   Specimen Description BLOOD BLOOD LEFT HAND  Final   Special Requests   Final    BOTTLES DRAWN AEROBIC ONLY Blood Culture results may not be optimal due to an inadequate volume of blood received in culture bottles   Culture   Final    NO GROWTH 2 DAYS Performed at [facility], [address] [place], [place] [address]    Report Status PENDING  Incomplete  Urine Culture     Status: None   Collection Time: 03/31/23  5:04 PM   Specimen: Urine, Random  Result Value Ref Range Status   Specimen Description   Final    URINE, RANDOM Performed at [facility], [address], [place], [place] [address]    Special Requests   Final    NONE Performed at [facility], [address], [place], [place] [address]    Culture   Final    NO GROWTH Performed at [facility], [address], [place], [place] [address]    Report Status 04/02/2023 FINAL  Final    Coagulation Studies: No results for input(s): "LABPROT", "INR" in the last 72 hours.  Urinalysis: Recent Labs    03/31/23 1005  COLORURINE YELLOW*  LABSPEC 1.017  PHURINE 5.0  GLUCOSEU 50*  HGBUR MODERATE*  BILIRUBINUR NEGATIVE  KETONESUR 5*  PROTEINUR >=300*  NITRITE NEGATIVE  LEUKOCYTESUR SMALL*      Imaging: CT ABDOMEN PELVIS WO CONTRAST  Result Date: 04/01/2023 CLINICAL DATA:  Right lower quadrant pain, elevated lipase, sepsis EXAM: CT ABDOMEN AND PELVIS WITHOUT CONTRAST TECHNIQUE: Multidetector CT imaging of the abdomen and pelvis was performed following the standard protocol without IV contrast. RADIATION DOSE REDUCTION: This exam was performed according to the departmental dose-optimization program which includes automated exposure control, adjustment of the mA and/or kV according to patient size and/or use of iterative reconstruction technique. COMPARISON:   03/31/2023 FINDINGS: Lower chest: Cardiomegaly. Trace bilateral pleural effusions. Bibasilar airspace opacities likely reflect atelectasis. 1.4 cm right lower lobe nodule compared to 1.6 cm previously. Hepatobiliary: No  focal hepatic abnormality. Gallbladder unremarkable. Pancreas: No focal abnormality or ductal dilatation. Spleen: No focal abnormality.  Normal size. Adrenals/Urinary Tract: Adrenal glands normal. No renal or adrenal stones. No hydronephrosis. Scattered low-density lesions within the kidneys most compatible with cysts. No follow-up imaging recommended. Foley catheter within the bladder, carcinoma. Stomach/Bowel: Stomach, large and small bowel grossly unremarkable. Umbilical hernia containing small bowel loops. No bowel obstruction. Vascular/Lymphatic: Aortic atherosclerosis. No evidence of aneurysm or adenopathy. Reproductive: Prior hysterectomy.  No adnexal masses. Other: Trace free fluid in the cul-de-sac.  No free air. Musculoskeletal: No acute bony abnormality. IMPRESSION: Trace bilateral pleural effusions. 1.4 cm right lower lobe pulmonary nodule, stable since prior study. This nodule has been present dating back to 2012 and was not avid on FDG imaging. No acute findings in the abdomen or pelvis. Umbilical hernia containing small bowel loops. No bowel obstruction. Aortic atherosclerosis. Electronically Signed   By: Charlett Nose M.D.   On: 04/01/2023 18:12   ECHOCARDIOGRAM COMPLETE  Result Date: 04/01/2023    ECHOCARDIOGRAM REPORT   Patient Name:   Sarah Lane Date of Exam: 04/01/2023 Medical Rec #:  [id number]   Height:       61.0 in Accession #:    [id number]  Weight:       187.2 lb Date of Birth:  1966-04-13   BSA:          1.836 m Patient Age:    56 years    BP:           86/52 mmHg Patient Gender: F           HR:           92 bpm. Exam Location:  [facility] Procedure: 2D Echo, 3D Echo, Cardiac Doppler, Color Doppler and Intracardiac            Opacification Agent Indications:     Abnormal ECG   History:         Patient has no prior history of Echocardiogram examinations.                  Abnormal ECG; Risk Factors:Diabetes. CKD, Sepsis.  Sonographer:     Mikki Harbor Referring Phys:  [id number] Ezequiel Essex Diagnosing Phys: [name] MD  Sonographer Comments: Technically difficult study due to poor echo windows. IMPRESSIONS  1. Left ventricular ejection fraction, by estimation, is 30 to 35%. Left ventricular ejection fraction by 2D MOD biplane is 38.4 %. Left ventricular ejection fraction by PLAX is 29 %. The left ventricle has moderately decreased function. The left ventricle demonstrates global hypokinesis. Left ventricular diastolic parameters are consistent with Grade I diastolic dysfunction (impaired relaxation).  2. Right ventricular systolic function is moderately reduced. The right ventricular size is moderately enlarged. There is mildly elevated pulmonary artery systolic pressure. The estimated right ventricular systolic pressure is 42.3 mmHg.  3. The mitral valve is normal in structure. Mild mitral valve regurgitation. No evidence of mitral stenosis.  4. Tricuspid valve regurgitation is mild to moderate.  5. The aortic valve is tricuspid. Aortic valve regurgitation is not visualized. No aortic stenosis is present.  6. The inferior vena cava is normal in size with <50% respiratory variability, suggesting right atrial pressure of 8 mmHg. FINDINGS  Left Ventricle: Left ventricular ejection fraction, by estimation, is 30 to 35%. Left ventricular ejection fraction by PLAX is 29 %. Left ventricular ejection fraction by 2D MOD biplane is 38.4 %. The left ventricle has moderately decreased function. The left ventricle  demonstrates global hypokinesis. Definity contrast agent was given IV to delineate the left ventricular endocardial borders. The left ventricular internal cavity size was normal in size. There is no left ventricular hypertrophy. Left ventricular diastolic parameters are consistent  with Grade I diastolic dysfunction (impaired relaxation). Right Ventricle: The right ventricular size is moderately enlarged. No increase in right ventricular wall thickness. Right ventricular systolic function is moderately reduced. There is mildly elevated pulmonary artery systolic pressure. The tricuspid regurgitant velocity is 2.84 m/s, and with an assumed right atrial pressure of 10 mmHg, the estimated right ventricular systolic pressure is 42.3 mmHg. Left Atrium: Left atrial size was normal in size. Right Atrium: Right atrial size was normal in size. Pericardium: There is no evidence of pericardial effusion. Mitral Valve: The mitral valve is normal in structure. There is mild calcification of the mitral valve leaflet(s). Mild mitral valve regurgitation. No evidence of mitral valve stenosis. MV peak gradient, 3.0 mmHg. The mean mitral valve gradient is 2.0 mmHg. Tricuspid Valve: The tricuspid valve is normal in structure. Tricuspid valve regurgitation is mild to moderate. No evidence of tricuspid stenosis. Aortic Valve: The aortic valve is tricuspid. Aortic valve regurgitation is not visualized. No aortic stenosis is present. Aortic valve mean gradient measures 6.0 mmHg. Aortic valve peak gradient measures 10.6 mmHg. Aortic valve area, by VTI measures 1.95  cm. Pulmonic Valve: The pulmonic valve was normal in structure. Pulmonic valve regurgitation is not visualized. No evidence of pulmonic stenosis. Aorta: The aortic root is normal in size and structure. Venous: The inferior vena cava is normal in size with less than 50% respiratory variability, suggesting right atrial pressure of 8 mmHg. IAS/Shunts: No atrial level shunt detected by color flow Doppler.  LEFT VENTRICLE PLAX 2D                        Biplane EF (MOD) LV EF:         Left            LV Biplane EF:   Left                ventricular                      ventricular                ejection                         ejection                fraction by                       fraction by                PLAX is 29                       2D MOD                %.                               biplane is LVIDd:         4.50 cm                          38.4 %.  LVIDs:         3.90 cm LV PW:         1.00 cm         Diastology LV IVS:        1.00 cm         LV e' medial:    8.70 cm/s LVOT diam:     1.90 cm         LV E/e' medial:  8.4 LV SV:         57              LV e' lateral:   10.60 cm/s LV SV Index:   31              LV E/e' lateral: 6.9 LVOT Area:     2.84 cm  LV Volumes (MOD) LV vol d, MOD    67.9 ml A2C: LV vol d, MOD    61.6 ml A4C: LV vol s, MOD    42.0 ml A2C: LV vol s, MOD    38.0 ml A4C: LV SV MOD A2C:   25.9 ml LV SV MOD A4C:   61.6 ml LV SV MOD BP:    25.2 ml RIGHT VENTRICLE RV Basal diam:  3.45 cm RV Mid diam:    2.80 cm RV S prime:     15.20 cm/s TAPSE (M-mode): 1.6 cm LEFT ATRIUM             Index        RIGHT ATRIUM           Index LA diam:        3.70 cm 2.01 cm/m   RA Area:     23.70 cm LA Vol (A2C):   46.2 ml 25.16 ml/m  RA Volume:   75.80 ml  41.28 ml/m LA Vol (A4C):   36.1 ml 19.66 ml/m LA Biplane Vol: 42.9 ml 23.36 ml/m  AORTIC VALVE                     PULMONIC VALVE AV Area (Vmax):    1.76 cm      PV Vmax:       1.13 m/s AV Area (Vmean):   1.64 cm      PV Peak grad:  5.1 mmHg AV Area (VTI):     1.95 cm AV Vmax:           163.00 cm/s AV Vmean:          117.000 cm/s AV VTI:            0.291 m AV Peak Grad:      10.6 mmHg AV Mean Grad:      6.0 mmHg LVOT Vmax:         101.00 cm/s LVOT Vmean:        67.700 cm/s LVOT VTI:          0.200 m LVOT/AV VTI ratio: 0.69  AORTA Ao Root diam: 3.20 cm MITRAL VALVE               TRICUSPID VALVE MV Area (PHT): 5.38 cm    TR Peak grad:   32.3 mmHg MV Area VTI:   2.67 cm    TR Vmax:        284.00 cm/s MV Peak grad:  3.0 mmHg MV Mean grad:  2.0 mmHg    SHUNTS MV Vmax:       0.87 m/s  Systemic VTI:  0.20 m MV Vmean:      63.8 cm/s   Systemic Diam: 1.90 cm MV Decel Time: 141 msec MV E velocity: 73.40 cm/s  MV A velocity: 87.00 cm/s MV E/A ratio:  0.84 [name] MD Electronically signed by [name] MD Signature Date/Time: 04/01/2023/1:49:48 PM    Final    CT HEAD WO CONTRAST ( )  Result Date: 03/31/2023 CLINICAL DATA:  Mental status change, unknown cause. EXAM: CT HEAD WITHOUT CONTRAST TECHNIQUE: Contiguous axial images were obtained from the base of the skull through the vertex without intravenous contrast. RADIATION DOSE REDUCTION: This exam was performed according to the departmental dose-optimization program which includes automated exposure control, adjustment of the mA and/or kV according to patient size and/or use of iterative reconstruction technique. COMPARISON:  CT scan head report from 03/01/2019. Images were not available for review. FINDINGS: Brain: No evidence of acute infarction, hemorrhage, hydrocephalus, extra-axial collection or mass lesion/mass effect. Areas of encephalomalacia noted involving the left external capsule/left basal ganglia with associated ex vacuo dilation of frontal horn of left lateral ventricle. There is additional small lacunar infarction in the right parietal lobe. There is bilateral periventricular hypodensity, which is non-specific but most likely seen in the settings of microvascular ischemic changes. Mild in extent. Vascular: No hyperdense vessel or unexpected calcification. Intracranial arteriosclerosis. Skull: Normal. Negative for fracture or focal lesion. Sinuses/Orbits: No acute finding. Other: Bilateral mastoid air cells are clear. IMPRESSION: 1. No acute intracranial abnormality. 2. 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 3. Mild microvascular ischemic changes. Electronically Signed   By: Jules Schick M.D.   On: 03/31/2023 15:12   CT ABDOMEN PELVIS WO CONTRAST  Result Date: 03/31/2023 CLINICAL DATA:  Abdominal pain, acute, nonlocalized. History of gynecological carcinoma. * Tracking Code: BO * EXAM: CT  ABDOMEN AND PELVIS WITHOUT CONTRAST TECHNIQUE: Multidetector CT imaging of the abdomen and pelvis was performed following the standard protocol without IV contrast. RADIATION DOSE REDUCTION: This exam was performed according to the departmental dose-optimization program which includes automated exposure control, adjustment of the mA and/or kV according to patient size and/or use of iterative reconstruction technique. COMPARISON:  CT scan abdomen and pelvis from 09/23/2010 and PET-CT scan from 01/16/2020. FINDINGS: Examination is limited due to lack of oral/intravenous contrast and patient's motion during data acquisition. Lower chest: Redemonstration of a well-circumscribed 1 point 4 x 1.6 cm solid noncalcified nodule in the right lung lower lobe. The nodule is present since the prior study dating back to February 2012 and exhibits mild interval growth. The nodule was not FDG avid on the prior PET-CT scan. The lung bases are otherwise clear. No pleural effusion. The heart is normal in size. No pericardial effusion. Hepatobiliary: The liver is normal in size. Non-cirrhotic configuration. No suspicious mass. No intrahepatic or extrahepatic bile duct dilation. There are layering hyperattenuating areas in the gallbladder, which may represent concentrated bile versus sludge. Normal gallbladder wall thickness. No pericholecystic inflammatory changes. Pancreas: Unremarkable. No pancreatic ductal dilatation or surrounding inflammatory changes. Spleen: Within normal limits. No focal lesion. Adrenals/Urinary Tract: Adrenal glands are unremarkable. No suspicious renal mass. There is a partially exophytic 1.5 x 1.6 cm cyst arising from the right kidney interpolar region, laterally. No hydronephrosis. No renal or ureteric calculi. Urinary bladder is under distended secondary to Foley catheter, precluding optimal assessment. However, no large mass or stones identified. No perivesical fat stranding. Stomach/Bowel: No  disproportionate dilation of the small or large bowel loops. No  evidence of abnormal bowel wall thickening or inflammatory changes. The appendix is unremarkable. Vascular/Lymphatic: No ascites or pneumoperitoneum. No abdominal or pelvic lymphadenopathy, by size criteria. No aneurysmal dilation of the major abdominal arteries. There are mild peripheral atherosclerotic vascular calcifications of the aorta and its major branches. Reproductive: The uterus is surgically absent. No large adnexal mass. Other: There is a small to medium periumbilical hernia containing fat and unobstructed distal small bowel loop. The soft tissues and abdominal wall are otherwise unremarkable. Musculoskeletal: No suspicious osseous lesions. There are mild - moderate multilevel degenerative changes in the visualized spine. IMPRESSION: 1. Suboptimal exam. 2. No acute inflammatory process identified within the abdomen or pelvis. No discrete metastatic disease identified. 3. Multiple other nonacute observations, as described above. Aortic Atherosclerosis (ICD10-I70.0). Electronically Signed   By: Jules Schick M.D.   On: 03/31/2023 15:04     Medications:     prismasol BGK 4/2.5 500 mL/hr at 04/01/23 2309    prismasol BGK 4/2.5 500 mL/hr at 04/02/23 1610   sodium chloride     sodium chloride Stopped (04/01/23 1722)   ceFEPime (MAXIPIME) IV Stopped (04/02/23 0553)   famotidine (PEPCID) IV Stopped (04/01/23 1344)   insulin Stopped (04/01/23 2159)   phenylephrine (NEO-SYNEPHRINE) Adult infusion Stopped (04/01/23 2124)    aspirin  81 mg Oral Daily   atorvastatin  40 mg Oral Daily   Chlorhexidine Gluconate Cloth  6 each Topical QHS   feeding supplement  237 mL Oral TID BM   heparin  5,000 Units Subcutaneous Q8H   insulin aspart  0-5 Units Subcutaneous QHS   insulin aspart  0-9 Units Subcutaneous TID WC   insulin glargine-yfgn  8 Units Subcutaneous Daily   ipratropium-albuterol  3 mL Nebulization BID   midodrine  5 mg Oral BID  WC   multivitamin  1 tablet Oral QHS   multivitamin with minerals  1 tablet Oral Daily   mupirocin ointment  1 Application Nasal BID   phosphorus  500 mg Oral Q4H   dextrose, docusate sodium, heparin sodium (porcine), ipratropium-albuterol, mouth rinse, polyethylene glycol  Assessment/ Plan:  Sarah Lane is a 57 y.o.  female  with a PMHx of diabetes mellitus type 2, seizure disorder, hypertension, dysrhythmia, history of ovarian cancer, who was admitted to [facility] on 03/31/2023 for Sepsis (HCC) [A41.9]   Acute kidney injury/proteinuria. Baseline creatinine was 0.76 on 02/19/2023. Patient now with significant acute kidney injury with proteinuria. She had a UA on 02/06/2023 which only showed trace proteinuria. CT scan abdomen pelvis was negative for hydronephrosis. Additional workup in progress SPEP, UPEP, ANA, ANCA antibodies, GBM antibodies.   Renal indices greatly improved with CRRT. Adequate urine output. Will stop CRRT and monitor renal function.   Lab Results  Component Value Date   CREATININE 1.92 (H) 04/02/2023   CREATININE 2.91 (H) 04/01/2023   CREATININE 3.02 (H) 04/01/2023    Intake/Output Summary (Last 24 hours) at 04/02/2023 1257 Last data filed at 04/02/2023 1200 Gross per 24 hour  Intake 2006.23 ml  Output 1794 ml  Net 212.23 ml   2. Severe acute metabolic acidosis. Contributions from uremia, Jardiance, and metformin possibly. Jardiance and metformin held. CRRT stopped due to improvements with acidosis.     LOS: 2 Sarah Lane 8/23/202412:57 PM

## 2023-04-02 NOTE — Plan of Care (Signed)

## 2023-04-02 NOTE — Progress Notes (Signed)
NAME:  Sarah Lane, MRN:  [id number], DOB:  08/03/1966, LOS: 2 ADMISSION DATE:  03/31/2023, CONSULTATION DATE: 03/31/23 REFERRING MD: Dr. Erma Heritage, CHIEF COMPLAINT: N/V   History of Present Illness:  This is a 57 yo female with a PMH of type II diabetes mellitus, seizures, HTN, dysrhythmia, and ovarian cancer.  She presented to [facility] ER on 08/21 via EMS with c/o nausea/vomiting/diarrhea/abdominal pain with inability to tolerate po's.  Onset of symptoms the night of 08/20.  Pt reports her CBG's have been running "High"although she has been taking and tolerating her metformin, jardiance, and insulin.  Upon EMS arrival at pts home she was hypoglycemic with CBG's in the 30's. She received 250 ml bolus of D10W with CBG increasing to 86.  She reports she is living with friends and has had contact with someone recently diagnosed with COVID.    ED Course  Upon arrival to the ER pt confused with weakness, dry mouth, and shortness of breath.  CXR concerning for right lower lobe pulmonary nodule seen on previous CT Chest, but negative for consolidation.  COVID/Influenza A&B/RSV negative.  Significant lab results were: chloride 88/CO2 <7/glucose 152/BUN 73/creatinine 7.31/anion gap not calculated/lipase 240/AST 52/total protein 8.5/lactic acid >9.0/wbc 15.0/beta-hydroxy >8.00.  UA results were: glucose 50/hgb moderate/ketones 5/leukocytes small/protein >= 300/bacteria rare/wbc 21-50.  Pt met sepsis criteria and received: 3L NS bolus/1L LR bolus/1 amp of sodium bicarb.  Cefepime, vancomycin, and metronidazole ordered per EDP.  PCCM team contacted for ICU admission.  CT Abd Pelvis without oral or iv contrast: Suboptimal exam. No acute inflammatory process identified within the abdomen or pelvis. No discrete metastatic disease identified. Multiple other nonacute observations, as described above. Aortic Atherosclerosis (ICD10-I70.0).  CT Head WO Contrast: No acute intracranial abnormality. 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 Mild microvascular ischemic changes.   Pertinent  Medical History  Childhood Asthma  Ovarian Cancer  Type II Diabetes Mellitus  Dysrhythmia  HTN  Seizures   Micro Data:  COVID 08/21>>negative  Influenza A&B 08/21>>negative  RSV 08/21>>negative  MRSA PCR 08/21>>positive  Blood x2 08/21>>no growth to date Urine 08/21>> NO growth  Anti-infectives (From admission, onward)    Start     Dose/Rate Route Frequency Ordered Stop   04/01/23 1800  ceFEPIme (MAXIPIME) 1 g in sodium chloride 0.9 % 100 mL IVPB  Status:  Discontinued        1 g 200 mL/hr over 30 Minutes Intravenous Every 24 hours 03/31/23 1844 04/01/23 0327   04/01/23 1800  ceFEPIme (MAXIPIME) 2 g in sodium chloride 0.9 % 100 mL IVPB        2 g 200 mL/hr over 30 Minutes Intravenous Every 12 hours 04/01/23 0931     04/01/23 0800  vancomycin (VANCOREADY) IVPB 750 mg/150 mL        750 mg 150 mL/hr over 60 Minutes Intravenous  Once 04/01/23 0446 04/01/23 0942   04/01/23 0700  metroNIDAZOLE (FLAGYL) IVPB 500 mg  Status:  Discontinued        500 mg 100 mL/hr over 60 Minutes Intravenous Every 12 hours 03/31/23 1901 04/01/23 1035   04/01/23 0600  ceFEPIme (MAXIPIME) 2 g in sodium chloride 0.9 % 100 mL IVPB        2 g 200 mL/hr over 30 Minutes Intravenous  Once 04/01/23 0327 04/01/23 0623   04/01/23 0600  vancomycin (VANCOREADY) IVPB 750 mg/150 mL  Status:  Discontinued        750 mg 150 mL/hr  over 60 Minutes Intravenous  Once 04/01/23 0334 04/01/23 0446   03/31/23 1846  vancomycin variable dose per unstable renal function (pharmacist dosing)  Status:  Discontinued         Does not apply See admin instructions 03/31/23 1847 04/01/23 1531   03/31/23 1300  vancomycin (VANCOREADY) IVPB 1750 mg/350 mL        1,750 mg 175 mL/hr over 120 Minutes Intravenous  Once 03/31/23 1245 03/31/23 1900   03/31/23 1300  ceFEPIme (MAXIPIME) 2 g in sodium chloride 0.9 % 100 mL IVPB        2 g 200  mL/hr over 30 Minutes Intravenous  Once 03/31/23 1245 03/31/23 1842   03/31/23 1245  metroNIDAZOLE (FLAGYL) IVPB 500 mg        500 mg 100 mL/hr over 60 Minutes Intravenous  Once 03/31/23 1232 03/31/23 1758       Significant Hospital Events: Including procedures, antibiotic start and stop dates in addition to other pertinent events   08/21: Pt admitted with euglycemic DKA suspect secondary to SGLT 2 requiring insulin gtt, acute metabolic encephalopathy, sepsis secondary to possible acute pancreatitis and/or UTI, severe lactic/metabolic acidosis, and acute kidney injury.  Nephrology consulted and CRRT initiated  08/22: Pt remains anuric on CRRT lactic/metabolic acidosis improving.  Beta-hydroxy remains elevated continuing insulin gtt until normalized  08/23: DKA resolved, weaned off vasopressors, BP remains soft. Renal Indices and UOP improved, plan to stop CRRT today. Echo from yesterday with HFrEF (EF 30-35%) and moderate reduction in RV function; will consult Cardiology/HF.  Interim History / Subjective:  -Repeat CT Abdomen & Pelvis yesterday with NO acute intraabdominal abnormality  -No significant events noted overnight -Afebrile, hemodynamically stable, vasopressors have been weaned off, BP remains soft -lactic acidosis resolved -DKA has resolved, has been converted to SSI and Semglee -Leukocytosis resolved, WBC improved to 7.1 from 11.5 -Remains on CRRT currently, UOP 1.3 L (net + 6.3L) ~ likely will d/c CRRT today once seen by Nephrology -Echo from yesterday with LVEF 30-35%, Grade I DD, RV systolic function moderately reduced with moderate enlargement of LV, mildly elevated pulmonary systolic pressure ~ will consult Cardiology/HF -Her only complaint this morning is mild SOB and abdominal tenderness to palpitation in the lower quadrants (abdomen remains soft and non distended, no guarding or rounding tenderness to suggest acute abdomen)  Objective   Blood pressure (!) 99/55, pulse 76,  temperature 98 F (36.7 C), temperature source Axillary, resp. rate 20, height 5\' 1"  (1.549 m), weight 86.1 kg, SpO2 96%.        Intake/Output Summary (Last 24 hours) at 04/02/2023 0725 Last data filed at 04/02/2023 0700 Gross per 24 hour  Intake 3005.24 ml  Output 2048 ml  Net 957.24 ml   Filed Weights   03/31/23 1410 04/01/23 0500 04/02/23 0457  Weight: 81 kg 84.9 kg 86.1 kg    Examination: General: Acutely-ill appearing female, laying in bed, remains on CRRT, in NAD HENT: Atraumatic, normocephalic, Supple, no JVD Lungs: Clear throughout and diminished at bases bilaterally, even, nonlabored, normal effort  Cardiovascular: NSR, s1s2, no r/g, 2+ radial/2+ distal pulses, no edema  Abdomen: +BS x4, soft, non distended, mild tenderness present, no guarding or rebound tenderness   Extremities: Normal bulk and tone, moves all extremities  Neuro: Awake and Alert, oriented x3 (disoriented to situation), following commands, no focal deficits, PERRLA  GU: Indwelling foley catheter producing clear yellow urine  Resolved Hospital Problem list   Nausea/Vomiting   Assessment & Plan:   #  Acute metabolic encephalopathy~improving   CT Head 08/21: No acute intracranial abnormality. 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 Mild microvascular ischemic changes. -Treatment of metabolic derangements as outlined below -Provide supportive care -Promote normal sleep/wake cycle and family presence -Avoid sedating medications as able  #Shock: Hypovolemic + Septic +/- Cardiogenic ~ RESOLVED #HFrEF (EF 30-35%) without acute exacerbation #Sinus tachycardia with Qtc prolongation~ RESOLVED #Elevated troponin suspect secondary to demand ischemia  PMHx: HTN Echocardiogram 04/01/23: LVEF 30-35%, Grade I DD, RV systolic function moderately reduced with moderate enlargement of LV, mildly elevated pulmonary systolic pressure -Continuous cardiac monitoring -Maintain MAP  >65 -Cautious IVF -Vasopressors as needed to maintain MAP goal ~ weaned off -Will need to try and wean off Midodrine given HFrEF -Lactic acid has normalized -HS Troponin peaked at 495 -TSH normal -Diuresis as BP and renal function permits ~ currently holding due to soft BP and AKI -Avoid Qtc prolongating medications  -Consult Cardiology/HF for new CHF, appreciate input  #Acute kidney injury secondary to ATN and SGLT 2~improving  #Severe metabolic and lactic acidosis~ RESOLVED #Hypophosphatemia  #Hypomagnesia ~ RESOLVED -Monitor I&O's / urinary output -Follow BMP -Ensure adequate renal perfusion -Avoid nephrotoxic agents as able -Replace electrolytes as indicated ~ Pharmacy following for assistance with electrolyte replacement -Nephrology following, appreciate input: continue CRRT per recommendations  - Urine protein/creatinine ratio; glomerular basement membrane antibodies; and multiple myeloma panel results pending    #Sepsis secondary to suspected UTI & questionable acute pancreatitis  CT Abd Pelvis WO IV or Oral Contrast 08/21: Suboptimal exam. No acute inflammatory process identified within the abdomen or pelvis.  Repeat CT Abdomen & Pelvis on 8/22 with no acute findings in abdomen or pelvis -Monitor fever curve -Trend WBC's & Procalcitonin -Follow cultures as above -Continue empiric Cefepime pending cultures & sensitivities -Trend Lipase  #Elevated AST~resolved   -Trend hepatic function panel   #Dysphagia  -Speech therapy evaluated appreciate input ~ Recommends Dysphagia 3 with thin liquid diet  #Euglycemic DKA suspect secondary to SGLT2 ~ RESOLVED -CBG's q4h; Target range of 140 to 180 -SSI & Semglee -Follow ICU Hypo/Hyperglycemia protocol -Diabetes coordinator following, appreciate input  -Pt should stop taking outpatient jardiance     Best Practice (right click and "Reselect all SmartList Selections" daily)  Diet/type: Dysphagia 3 diet  DVT prophylaxis:  prophylactic heparin  GI prophylaxis: H2B Lines: Left femoral temporary trialysis catheter and it is still needed  Foley:  Yes, and it is still needed Code Status:  full code Last date of multidisciplinary goals of care discussion [04/02/23]  08/23: Updated pt regarding current plan of care and all questions answered   Labs   CBC: Recent Labs  Lab 03/31/23 1005 03/31/23 1451 04/01/23 0406 04/02/23 0420  WBC 15.0* 17.8* 11.5* 7.1  NEUTROABS 13.5*  --   --  4.8  HGB 17.3* 14.4 12.9 11.5*  HCT 57.9* 45.4 38.0 33.9*  MCV 94.3 89.9 84.4 84.3  PLT 316 303 192 126*    Basic Metabolic Panel: Recent Labs  Lab 03/31/23 1752 03/31/23 2030 03/31/23 2308 04/01/23 0406 04/01/23 1033 04/01/23 1350 04/01/23 1913 04/02/23 0420  NA  --   --    < > 138 137 132* 135 139  K  --   --    < > 3.7 4.4 3.7 3.9 3.8  CL  --   --    < > 98 100 100 99 102  CO2  --   --    < > 24 26  25 23 24   GLUCOSE  --   --    < > 132* 142* 208* 151* 108*  BUN  --   --    < > 36* 33* 29* 26* 15  CREATININE  --   --    < > 3.14* 3.42* 3.02* 2.91* 1.92*  CALCIUM  --   --    < > 8.1* 8.1* 7.7* 7.9* 7.9*  MG 1.1* 1.1*  --  2.5*  --   --   --  2.2  PHOS 7.8*  --   --  2.0*  --   --  2.8 1.9*   < > = values in this interval not displayed.   GFR: Estimated Creatinine Clearance: 31.8 mL/min (A) (by C-G formula based on SCr of 1.92 mg/dL (H)). Recent Labs  Lab 03/31/23 1005 03/31/23 1150 03/31/23 1449 03/31/23 1451 03/31/23 1751 04/01/23 0406 04/01/23 1033 04/01/23 1350 04/01/23 1904 04/02/23 0420  PROCALCITON  --   --  0.30  --   --   --  0.44  --   --   --   WBC 15.0*  --   --  17.8*  --  11.5*  --   --   --  7.1  LATICACIDVEN  --    < > >9.0*  --    < > 4.0* 4.1* 2.5* 3.8* 1.6   < > = values in this interval not displayed.    Liver Function Tests: Recent Labs  Lab 03/31/23 1103 04/01/23 0406 04/01/23 1913  AST 52* 26  --   ALT 35 21  --   ALKPHOS 61 38  --   BILITOT 1.2 1.1  --   PROT 8.5*  5.8*  --   ALBUMIN 4.1 2.9* 2.8*   Recent Labs  Lab 03/31/23 1103 03/31/23 1449 04/01/23 0406 04/01/23 1033  LIPASE 240*  --  198*  --   AMYLASE  --  411*  --  517*   No results for input(s): "AMMONIA" in the last 168 hours.  ABG    Component Value Date/Time   HCO3 27.2 04/01/2023 1200   ACIDBASEDEF 14.8 (H) 03/31/2023 2030   O2SAT 74.1 04/01/2023 1200     Coagulation Profile: No results for input(s): "INR", "PROTIME" in the last 168 hours.  Cardiac Enzymes: No results for input(s): "CKTOTAL", "CKMB", "CKMBINDEX", "TROPONINI" in the last 168 hours.  HbA1C: Hgb A1c MFr Bld  Date/Time Value Ref Range Status  03/31/2023 02:49 PM 8.2 (H) 4.8 - 5.6 % Final    Comment:    (NOTE) Pre diabetes:          5.7%-6.4%  Diabetes:              >6.4%  Glycemic control for   <7.0% adults with diabetes   12/31/2014 09:08 PM 6.9 (H) 4.8 - 5.6 % Final    Comment:    (NOTE)         Pre-diabetes: 5.7 - 6.4         Diabetes: >6.4         Glycemic control for adults with diabetes: <7.0     CBG: Recent Labs  Lab 04/01/23 1714 04/01/23 1831 04/01/23 1947 04/01/23 2110 04/01/23 2202  GLUCAP 126* 129* 120* 129* 126*    Review of Systems: Positives in BOLD   Gen: fever, chills, weight change, fatigue, night sweats HEENT: Denies blurred vision, double vision, hearing loss, tinnitus, sinus congestion, rhinorrhea, sore throat, neck stiffness, dysphagia PULM: shortness of  breath, cough, sputum production, hemoptysis, wheezing CV: Denies chest pain, edema, orthopnea, paroxysmal nocturnal dyspnea, palpitations GI: abdominal pain, nausea, vomiting, diarrhea, hematochezia, melena, constipation, change in bowel habits GU: Denies dysuria, hematuria, polyuria, oliguria, urethral discharge Endocrine: Denies hot or cold intolerance, polyuria, polyphagia or appetite change Derm: Denies rash, dry skin, scaling or peeling skin change Heme: Denies easy bruising, bleeding, bleeding  gums Neuro: headache, numbness, weakness, slurred speech, loss of memory or consciousness   Past Medical History:  She,  has a past medical history of Asthma, Cancer (HCC), Diabetes mellitus without complication (HCC), Dysrhythmia, Family history of adverse reaction to anesthesia, Hypertension, and Seizures (HCC).   Surgical History:   Past Surgical History:  Procedure Laterality Date   ABDOMINAL HYSTERECTOMY  2011   ovarian cancer   IRRIGATION AND DEBRIDEMENT ABDOMEN N/A 12/31/2014   Procedure: IRRIGATION AND DEBRIDEMENT ABDOMINAL WALL ABSCESS;  Surgeon: Luretha Murphy, MD;  Location: [facility] ORS;  Service: General;  Laterality: N/A;     Social History:   reports that she has never smoked. She does not have any smokeless tobacco history on file. She reports that she does not drink alcohol and does not use drugs.   Family History:  Her family history is not on file.   Allergies Allergies  Allergen Reactions   Codeine Nausea And Vomiting   Penicillins     Family history of allergry.     Ibuprofen Nausea And Vomiting     Home Medications  Prior to Admission medications   Medication Sig Start Date End Date Taking? Authorizing Provider  aspirin 81 MG chewable tablet Chew 81 mg by mouth daily.    [provider]  atorvastatin (LIPITOR) 40 MG tablet Take 40 mg by mouth daily.    [provider]  calcium-vitamin D (OSCAL WITH D) 500-200 MG-UNIT per tablet Take 1 tablet by mouth 2 (two) times daily.    [provider]  canagliflozin (INVOKANA) 300 MG TABS tablet Take 300 mg by mouth daily before breakfast.    [provider]  Diphenhydramine-PE-APAP (ALLERGY RELIEF PLUS SINUS PO) Take 1 tablet by mouth 2 (two) times daily.    [provider]  docusate sodium (COLACE) 100 MG capsule Take 1 capsule (100 mg total) by mouth 2 (two) times daily as needed for mild constipation. 01/04/15   Nonie Hoyer, PA-C  doxycycline (VIBRA-TABS) 100 MG tablet Take  1 tablet (100 mg total) by mouth every 12 (twelve) hours. 01/04/15   Nonie Hoyer, PA-C  HYDROcodone-acetaminophen (NORCO/VICODIN) 5-325 MG per tablet Take 1-2 tablets by mouth every 6 (six) hours as needed for moderate pain. 01/04/15   Nonie Hoyer, PA-C  losartan (COZAAR) 100 MG tablet Take 100 mg by mouth daily.    [provider]  metFORMIN (GLUCOPHAGE) 1000 MG tablet Take 1,000 mg by mouth 2 (two) times daily with a meal.    [provider]     Critical care time: 40 minutes     Harlon Ditty, AGACNP-BC [facility] Prefer epic messenger for cross cover needs If after hours, please call E-link

## 2023-04-02 NOTE — Progress Notes (Addendum)
Speech Language Pathology Treatment: Dysphagia  Patient Details Name: Sarah Lane MRN: [id number] DOB: 06-19-1966 Today's Date: 04/02/2023 Time: 3086-5784 SLP Time Calculation (min) (ACUTE ONLY): 13 min  Assessment / Plan / Recommendation Clinical Impression  Pt seen for ongoing dysphagia management/diet toleration. Pt sitting upright in recliner. Skilled observation provided of pt consuming mixed consistency (diced peach cup), graham crackers and thin liquids via straw. Pt with functional oral phase observed with complete oral clearing and no overt s/s of aspiration. Pt's pharyngeal swallow appears swift. Pt states that she is comfort consuming dysphagia 3 diet textures (d/t lack of dentition) and does not desire a diet upgrade at this time. Further skilled ST intervention is no longer indicated and services will sign off.     HPI HPI: PT is a 57 yo female who She presented to [facility] ER on 08/21 via EMS with c/o nausea/vomiting/diarrhea/abdominal pain with inability to tolerate po's. Onset of symptoms the night of 08/20. Pt with a PMH of type II diabetes mellitus, seizures, stroke, HTN, dysrhythmia, and ovarian cancer. Head CT on admission, "1. No acute intracranial abnormality.  2. 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  3. Mild microvascular ischemic changes." CXR on admission, "1. Right lower lobe pulmonary nodule, seen on prior CT chest.  2. No consolidation." Abdominal CT on admission, "1. Suboptimal exam.  2. No acute inflammatory process identified within the abdomen or  pelvis. No discrete metastatic disease identified.  3. Multiple other nonacute observations, as described above."      SLP Plan  All goals met      Recommendations for follow up therapy are one component of a multi-disciplinary discharge planning process, led by the attending physician.  Recommendations may be updated based on patient status, additional functional criteria and  insurance authorization.    Recommendations  Diet recommendations: Dysphagia 3 (mechanical soft);Thin liquid Liquids provided via: Cup;Straw Medication Administration: Whole meds with puree Supervision: Patient able to self feed Compensations: Small sips/bites;Slow rate Postural Changes and/or Swallow Maneuvers: Out of bed for meals;Seated upright 90 degrees;Upright 30-60 min after meal                  Oral care BID   None Dysphagia, oral phase (R13.11)     All goals met   Courtez Twaddle B. Dreama Saa, M.S., CCC-SLP, Tree surgeon Certified Brain Injury Specialist [facility] Office [phone] Ascom [phone] Fax [phone]

## 2023-04-03 DIAGNOSIS — N179 Acute kidney failure, unspecified: Secondary | ICD-10-CM | POA: Diagnosis not present

## 2023-04-03 LAB — CBC
HCT: 33.9 % — ABNORMAL LOW (ref 36.0–46.0)
Hemoglobin: 11.3 g/dL — ABNORMAL LOW (ref 12.0–15.0)
MCH: 28.8 pg (ref 26.0–34.0)
MCHC: 33.3 g/dL (ref 30.0–36.0)
MCV: 86.3 fL (ref 80.0–100.0)
Platelets: 109 10*3/uL — ABNORMAL LOW (ref 150–400)
RBC: 3.93 MIL/uL (ref 3.87–5.11)
RDW: 13.4 % (ref 11.5–15.5)
WBC: 7.5 10*3/uL (ref 4.0–10.5)
nRBC: 0 % (ref 0.0–0.2)

## 2023-04-03 LAB — BASIC METABOLIC PANEL
Anion gap: 10 (ref 5–15)
BUN: 17 mg/dL (ref 6–20)
CO2: 26 mmol/L (ref 22–32)
Calcium: 8.3 mg/dL — ABNORMAL LOW (ref 8.9–10.3)
Chloride: 104 mmol/L (ref 98–111)
Creatinine, Ser: 1.95 mg/dL — ABNORMAL HIGH (ref 0.44–1.00)
GFR, Estimated: 29 mL/min — ABNORMAL LOW (ref >60)
Glucose, Bld: 99 mg/dL (ref 70–99)
Potassium: 3.4 mmol/L — ABNORMAL LOW (ref 3.5–5.1)
Sodium: 140 mmol/L (ref 135–145)

## 2023-04-03 LAB — GLUCOSE, CAPILLARY
Glucose-Capillary: 83 mg/dL (ref 70–99)
Glucose-Capillary: 120 mg/dL — ABNORMAL HIGH (ref 70–99)
Glucose-Capillary: 114 mg/dL — ABNORMAL HIGH (ref 70–99)
Glucose-Capillary: 124 mg/dL — ABNORMAL HIGH (ref 70–99)

## 2023-04-03 LAB — MAGNESIUM: Magnesium: 2.1 mg/dL (ref 1.7–2.4)

## 2023-04-03 MED ORDER — K PHOS MONO-SOD PHOS DI & MONO 155-852-130 MG PO TABS
500.0000 mg | ORAL_TABLET | ORAL | Status: DC
  Administered 2023-04-03: 500 mg via ORAL
  Filled 2023-04-03 (×2): qty 2

## 2023-04-03 MED ORDER — POTASSIUM CHLORIDE CRYS ER 20 MEQ PO TBCR
20.0000 meq | EXTENDED_RELEASE_TABLET | Freq: Once | ORAL | Status: AC
  Administered 2023-04-03: 20 meq via ORAL
  Filled 2023-04-03: qty 1

## 2023-04-03 NOTE — Progress Notes (Signed)
Progress Note    Sarah Lane  VHQ:[id number] DOB: 03/25/1966  DOA: 03/31/2023 PCP: [facility]      Brief Narrative:    Medical records reviewed and are as summarized below:  Sarah Lane is a 57 y.o. female  with  PMH of type II diabetes mellitus, seizures, HTN, dysrhythmia, and ovarian cancer.  She presented to [facility] ER on 08/21 via EMS with c/o nausea/vomiting/diarrhea/abdominal pain with inability to tolerate po's.  Onset of symptoms the night of 08/20.  Pt reports her CBG's have been running "High"although she has been taking and tolerating her metformin, jardiance, and insulin.  Upon EMS arrival at pts home she was hypoglycemic with CBG's in the 30's. She received 250 ml bolus of D10W with CBG increasing to 86.  She reports she is living with friends and has had contact with someone recently diagnosed with COVID.       ED Course  Upon arrival to the ER pt confused with weakness, dry mouth, and shortness of breath.  CXR concerning for right lower lobe pulmonary nodule seen on previous CT Chest, but negative for consolidation.  COVID/Influenza A&B/RSV negative.  Significant lab results were: chloride 88/CO2 <7/glucose 152/BUN 73/creatinine 7.31/anion gap not calculated/lipase 240/AST 52/total protein 8.5/lactic acid >9.0/wbc 15.0/beta-hydroxy >8.00.  UA results were: glucose 50/hgb moderate/ketones 5/leukocytes small/protein >= 300/bacteria rare/wbc 21-50.  Pt met sepsis criteria and received: 3L NS bolus/1L LR bolus/1 amp of sodium bicarb.  Cefepime, vancomycin, and metronidazole ordered per EDP.  PCCM team contacted for ICU admission.   CT Abd Pelvis without oral or iv contrast: Suboptimal exam. No acute inflammatory process identified within the abdomen or pelvis. No discrete metastatic disease identified. Multiple other nonacute observations, as described above. Aortic Atherosclerosis (ICD10-I70.0).   CT Head WO Contrast: No acute intracranial abnormality. Old left  external capsule/left basal ganglia and right parietal lobe infarctions. Mild microvascular ischemic changes.   Significant Hospital Events: Including procedures, antibiotic start and stop dates in addition to other pertinent events   08/21: Pt admitted with euglycemic DKA suspect secondary to SGLT 2 requiring insulin gtt, acute metabolic encephalopathy, sepsis secondary to possible acute pancreatitis and/or UTI, severe lactic/metabolic acidosis, and acute kidney injury.  Nephrology consulted and CRRT initiated  08/22: Pt remains anuric on CRRT lactic/metabolic acidosis improving.  Beta-hydroxy remains elevated continuing insulin gtt until normalized  08/23: DKA resolved, weaned off vasopressors, BP remains soft. Renal Indices and UOP improved, plan to stop CRRT today. Echo from yesterday with HFrEF (EF 30-35%) and moderate reduction in RV function; will consult Cardiology/HF.     Assessment/Plan:   Principal Problem:   Sepsis (HCC) Active Problems:   Acute renal failure (HCC)   Nutrition Problem: Inadequate oral intake Etiology: acute illness  Signs/Symptoms: meal completion < 25%   Body mass index is 35.28 kg/m.  (Morbid obesity)   AKI: Improving.  S/p CCRT.  CCRT on hold for now.  Follow-up with nephrologist.   Euglycemic DKA, type II DM: Continue insulin glargine.  NovoLog as needed for hyperglycemia.  S/p treatment with IV insulin drip.  Jardiance and metformin have been held. Hemoglobin A1c was 8.2.   Hypotension: Improved.  She is off of vasopressors.   Acute metabolic encephalopathy: Improved   Shock: Resolved.  Differential diagnosis include hypovolemic versus septic versus cardiogenic shock.   Elevated troponins: Peak troponin 495.  This is likely due to demand ischemia Chronic systolic CHF: Not on GDMT because of AKI.  Plan for left and  right heart cath when kidney function improves.  2D on 04/01/2023 echo showed EF estimated at 30 to 35%. 2D echo in June  2024 showed EF estimated at 15 to 25% at [facility]   Suspected sepsis secondary to acute UTI: No growth on urine and blood cultures.  Completed 3 days of IV cefepime.  She also had 2 days worth of IV vancomycin and IV Flagyl.  Elevated lipase and amylase: Probably due to AKI.  No acute abnormalities of pancreatitis on CT abdomen and pelvis.   Hypophosphatemia, hypokalemia and hypomagnesemia: Defer treatment to nephrologist.  Elevated liver enzymes: Improved   History of SVT: Self-limited.   Recent left MCA stroke s/p thrombectomy in June 2024 (at [facility] )   Dysphagia: She is on dysphagia 3 diet.   1.4 cm right lower lobe pulmonary nodule: Stable since study in 2012.    Diet Order             DIET DYS 3 Room service appropriate? Yes; Fluid consistency: Thin  Diet effective now                            Consultants: Nephrologist Cardiologist Intensivist  Procedures: Left femoral vein central line on 03/31/2023    Medications:    aspirin  81 mg Oral Daily   atorvastatin  40 mg Oral Daily   Chlorhexidine Gluconate Cloth  6 each Topical Daily   famotidine  20 mg Oral Daily   feeding supplement  237 mL Oral TID BM   heparin  5,000 Units Subcutaneous Q8H   insulin aspart  0-5 Units Subcutaneous QHS   insulin aspart  0-9 Units Subcutaneous TID WC   insulin glargine-yfgn  8 Units Subcutaneous Daily   midodrine  2.5 mg Oral BID WC   multivitamin  1 tablet Oral QHS   multivitamin with minerals  1 tablet Oral Daily   mupirocin ointment  1 Application Nasal BID   phosphorus  500 mg Oral Q4H   potassium chloride  20 mEq Oral Once   Continuous Infusions:  sodium chloride     sodium chloride Stopped (04/01/23 1722)     Anti-infectives (From admission, onward)    Start     Dose/Rate Route Frequency Ordered Stop   04/01/23 1800  ceFEPIme (MAXIPIME) 1 g in sodium chloride 0.9 % 100 mL IVPB  Status:  Discontinued        1 g 200 mL/hr over 30 Minutes  Intravenous Every 24 hours 03/31/23 1844 04/01/23 0327   04/01/23 1800  ceFEPIme (MAXIPIME) 2 g in sodium chloride 0.9 % 100 mL IVPB        2 g 200 mL/hr over 30 Minutes Intravenous Every 12 hours 04/01/23 0931 04/02/23 1807   04/01/23 0800  vancomycin (VANCOREADY) IVPB 750 mg/150 mL        750 mg 150 mL/hr over 60 Minutes Intravenous  Once 04/01/23 0446 04/01/23 0942   04/01/23 0700  metroNIDAZOLE (FLAGYL) IVPB 500 mg  Status:  Discontinued        500 mg 100 mL/hr over 60 Minutes Intravenous Every 12 hours 03/31/23 1901 04/01/23 1035   04/01/23 0600  ceFEPIme (MAXIPIME) 2 g in sodium chloride 0.9 % 100 mL IVPB        2 g 200 mL/hr over 30 Minutes Intravenous  Once 04/01/23 0327 04/01/23 0623   04/01/23 0600  vancomycin (VANCOREADY) IVPB 750 mg/150 mL  Status:  Discontinued  750 mg 150 mL/hr over 60 Minutes Intravenous  Once 04/01/23 0334 04/01/23 0446   03/31/23 1846  vancomycin variable dose per unstable renal function (pharmacist dosing)  Status:  Discontinued         Does not apply See admin instructions 03/31/23 1847 04/01/23 1531   03/31/23 1300  vancomycin (VANCOREADY) IVPB 1750 mg/350 mL        1,750 mg 175 mL/hr over 120 Minutes Intravenous  Once 03/31/23 1245 03/31/23 1900   03/31/23 1300  ceFEPIme (MAXIPIME) 2 g in sodium chloride 0.9 % 100 mL IVPB        2 g 200 mL/hr over 30 Minutes Intravenous  Once 03/31/23 1245 03/31/23 1842   03/31/23 1245  metroNIDAZOLE (FLAGYL) IVPB 500 mg        500 mg 100 mL/hr over 60 Minutes Intravenous  Once 03/31/23 1232 03/31/23 1758              Family Communication/Anticipated D/C date and plan/Code Status   DVT prophylaxis: heparin injection 5,000 Units Start: 03/31/23 1400 SCDs Start: 03/31/23 1311     Code Status: Full Code  Family Communication: None Disposition Plan: Plan to discharge to SNF versus home.   Status is: Inpatient Remains inpatient appropriate because: AKI, cardiomyopathy and may need to cardiac  catheterization prior to discharge       Subjective:   Interval events noted.  She feels better today.  She has no complaints.  No chest pain, shortness of breath, vomiting, diarrhea or abdominal pain.  Objective:    Vitals:   04/03/23 0530 04/03/23 0600 04/03/23 0700 04/03/23 0800  BP:  (!) 109/57 126/63 128/66  Pulse: 68 71 71 76  Resp: 15 17 18 17   Temp:    97.8 F (36.6 C)  TempSrc:    Oral  SpO2: 93% 93% 95% 95%  Weight:      Height:       No data found.   Intake/Output Summary (Last 24 hours) at 04/03/2023 0917 Last data filed at 04/03/2023 4098 Gross per 24 hour  Intake 150 ml  Output 2350 ml  Net -2200 ml   Filed Weights   04/01/23 0500 04/02/23 0457 04/03/23 0407  Weight: 84.9 kg 86.1 kg 84.7 kg    Exam:  GEN: NAD SKIN: Warm and dry EYES: No pallor or icterus ENT: MMM CV: RRR PULM: CTA B ABD: soft, ND, NT, +BS CNS: AAO x 3, non focal EXT: No edema or tenderness        Data Reviewed:   I have personally reviewed following labs and imaging studies:  Labs: Labs show the following:   Basic Metabolic Panel: Recent Labs  Lab 03/31/23 1752 03/31/23 2030 03/31/23 2308 04/01/23 0406 04/01/23 1033 04/01/23 1350 04/01/23 1913 04/02/23 0420 04/03/23 0418  NA  --   --    < > 138 137 132* 135 139 140  K  --   --    < > 3.7 4.4 3.7 3.9 3.8 3.4*  CL  --   --    < > 98 100 100 99 102 104  CO2  --   --    < > 24 26 25 23 24 26   GLUCOSE  --   --    < > 132* 142* 208* 151* 108* 99  BUN  --   --    < > 36* 33* 29* 26* 15 17  CREATININE  --   --    < > 3.14*  3.42* 3.02* 2.91* 1.92* 1.95*  CALCIUM  --   --    < > 8.1* 8.1* 7.7* 7.9* 7.9* 8.3*  MG 1.1* 1.1*  --  2.5*  --   --   --  2.2 2.1  PHOS 7.8*  --   --  2.0*  --   --  2.8 1.9*  --    < > = values in this interval not displayed.   GFR Estimated Creatinine Clearance: 31.1 mL/min (A) (by C-G formula based on SCr of 1.95 mg/dL (H)). Liver Function Tests: Recent Labs  Lab 03/31/23 1103  04/01/23 0406 04/01/23 1913  AST 52* 26  --   ALT 35 21  --   ALKPHOS 61 38  --   BILITOT 1.2 1.1  --   PROT 8.5* 5.8*  --   ALBUMIN 4.1 2.9* 2.8*   Recent Labs  Lab 03/31/23 1103 03/31/23 1449 04/01/23 0406 04/01/23 1033  LIPASE 240*  --  198*  --   AMYLASE  --  411*  --  517*   No results for input(s): "AMMONIA" in the last 168 hours. Coagulation profile No results for input(s): "INR", "PROTIME" in the last 168 hours.  CBC: Recent Labs  Lab 03/31/23 1005 03/31/23 1451 04/01/23 0406 04/02/23 0420 04/03/23 0418  WBC 15.0* 17.8* 11.5* 7.1 7.5  NEUTROABS 13.5*  --   --  4.8  --   HGB 17.3* 14.4 12.9 11.5* 11.3*  HCT 57.9* 45.4 38.0 33.9* 33.9*  MCV 94.3 89.9 84.4 84.3 86.3  PLT 316 303 192 126* 109*   Cardiac Enzymes: No results for input(s): "CKTOTAL", "CKMB", "CKMBINDEX", "TROPONINI" in the last 168 hours. BNP (last 3 results) No results for input(s): "PROBNP" in the last 8760 hours. CBG: Recent Labs  Lab 04/02/23 1124 04/02/23 1723 04/02/23 1941 04/02/23 2130 04/03/23 0742  GLUCAP 127* 149* 135* 119* 83   D-Dimer: No results for input(s): "DDIMER" in the last 72 hours. Hgb A1c: Recent Labs    03/31/23 1449  HGBA1C 8.2*   Lipid Profile: No results for input(s): "CHOL", "HDL", "LDLCALC", "TRIG", "CHOLHDL", "LDLDIRECT" in the last 72 hours. Thyroid function studies: Recent Labs    03/31/23 1449 03/31/23 1751  TSH 0.924  --   T3FREE  --  1.8*   Anemia work up: No results for input(s): "VITAMINB12", "FOLATE", "FERRITIN", "TIBC", "IRON", "RETICCTPCT" in the last 72 hours. Sepsis Labs: Recent Labs  Lab 03/31/23 1449 03/31/23 1451 03/31/23 1751 04/01/23 0406 04/01/23 1033 04/01/23 1350 04/01/23 1904 04/02/23 0420 04/03/23 0418  PROCALCITON 0.30  --   --   --  0.44  --   --   --   --   WBC  --  17.8*  --  11.5*  --   --   --  7.1 7.5  LATICACIDVEN >9.0*  --    < > 4.0* 4.1* 2.5* 3.8* 1.6  --    < > = values in this interval not displayed.     Microbiology Recent Results (from the past 240 hour(s))  Resp panel by RT-PCR (RSV, Flu A&B, Covid) Anterior Nasal Swab     Status: None   Collection Time: 03/31/23 10:06 AM   Specimen: Anterior Nasal Swab  Result Value Ref Range Status   SARS Coronavirus 2 by RT PCR NEGATIVE NEGATIVE Final    Comment: (NOTE) SARS-CoV-2 target nucleic acids are NOT DETECTED.  The SARS-CoV-2 RNA is generally detectable in upper respiratory specimens during the acute phase of infection. The  lowest concentration of SARS-CoV-2 viral copies this assay can detect is 138 copies/mL. A negative result does not preclude SARS-Cov-2 infection and should not be used as the sole basis for treatment or other patient management decisions. A negative result may occur with  improper specimen collection/handling, submission of specimen other than nasopharyngeal swab, presence of viral mutation(s) within the areas targeted by this assay, and inadequate number of viral copies(<138 copies/mL). A negative result must be combined with clinical observations, patient history, and epidemiological information. The expected result is Negative.  Fact Sheet for Patients:  BloggerCourse.com  Fact Sheet for Healthcare Providers:  SeriousBroker.it  This test is no t yet approved or cleared by the [place] FDA and  has been authorized for detection and/or diagnosis of SARS-CoV-2 by FDA under an Emergency Use Authorization (EUA). This EUA will remain  in effect (meaning this test can be used) for the duration of the COVID-19 declaration under Section 564(b)(1) of the Act, 21 U.S.C.section 360bbb-3(b)(1), unless the authorization is terminated  or revoked sooner.       Influenza A by PCR NEGATIVE NEGATIVE Final   Influenza B by PCR NEGATIVE NEGATIVE Final    Comment: (NOTE) The Xpert Xpress SARS-CoV-2/FLU/RSV plus assay is intended as an aid in the diagnosis of  influenza from Nasopharyngeal swab specimens and should not be used as a sole basis for treatment. Nasal washings and aspirates are unacceptable for Xpert Xpress SARS-CoV-2/FLU/RSV testing.  Fact Sheet for Patients: BloggerCourse.com  Fact Sheet for Healthcare Providers: SeriousBroker.it  This test is not yet approved or cleared by the [place] FDA and has been authorized for detection and/or diagnosis of SARS-CoV-2 by FDA under an Emergency Use Authorization (EUA). This EUA will remain in effect (meaning this test can be used) for the duration of the COVID-19 declaration under Section 564(b)(1) of the Act, 21 U.S.C. section 360bbb-3(b)(1), unless the authorization is terminated or revoked.     Resp Syncytial Virus by PCR NEGATIVE NEGATIVE Final    Comment: (NOTE) Fact Sheet for Patients: BloggerCourse.com  Fact Sheet for Healthcare Providers: SeriousBroker.it  This test is not yet approved or cleared by the [place] FDA and has been authorized for detection and/or diagnosis of SARS-CoV-2 by FDA under an Emergency Use Authorization (EUA). This EUA will remain in effect (meaning this test can be used) for the duration of the COVID-19 declaration under Section 564(b)(1) of the Act, 21 U.S.C. section 360bbb-3(b)(1), unless the authorization is terminated or revoked.  Performed at [facility], [address] [place], [place] [address]   MRSA Next Gen by PCR, Nasal     Status: Abnormal   Collection Time: 03/31/23  2:23 PM   Specimen: Nasal Mucosa; Nasal Swab  Result Value Ref Range Status   MRSA by PCR Next Gen DETECTED (A) NOT DETECTED Final    Comment: RESULT CALLED TO, READ BACK BY AND VERIFIED WITH: CHARLIE CORN 03/31/23 1546 KLW (NOTE) The GeneXpert MRSA Assay (FDA approved for NASAL specimens only), is one component of a comprehensive MRSA  colonization surveillance program. It is not intended to diagnose MRSA infection nor to guide or monitor treatment for MRSA infections. Test performance is not FDA approved in patients less than 33 years old. Performed at [facility], [address] [place], [place] [address]   Blood culture (routine x 2)     Status: None (Preliminary result)   Collection Time: 03/31/23  2:49 PM   Specimen: BLOOD  Result Value Ref Range  Status   Specimen Description BLOOD LEFT ANTECUBITAL  Final   Special Requests   Final    BOTTLES DRAWN AEROBIC AND ANAEROBIC Blood Culture results may not be optimal due to an inadequate volume of blood received in culture bottles   Culture   Final    NO GROWTH 3 DAYS Performed at [facility], [address] [place], [place] [address]    Report Status PENDING  Incomplete  Blood culture (routine x 2)     Status: None (Preliminary result)   Collection Time: 03/31/23  3:01 PM   Specimen: BLOOD  Result Value Ref Range Status   Specimen Description BLOOD BLOOD LEFT HAND  Final   Special Requests   Final    BOTTLES DRAWN AEROBIC ONLY Blood Culture results may not be optimal due to an inadequate volume of blood received in culture bottles   Culture   Final    NO GROWTH 3 DAYS Performed at [facility], [address], [place], [place] [address]    Report Status PENDING  Incomplete  Urine Culture     Status: None   Collection Time: 03/31/23  5:04 PM   Specimen: Urine, Random  Result Value Ref Range Status   Specimen Description   Final    URINE, RANDOM Performed at [facility], [address] [place], [place] [address]    Special Requests   Final    NONE Performed at [facility], [address], [place], [place] [address]    Culture   Final    NO GROWTH Performed at [facility], [address], [place], [place] [address]    Report Status 04/02/2023 FINAL  Final    Procedures and diagnostic  studies:  CT ABDOMEN PELVIS WO CONTRAST  Result Date: 04/01/2023 CLINICAL DATA:  Right lower quadrant pain, elevated lipase, sepsis EXAM: CT ABDOMEN AND PELVIS WITHOUT CONTRAST TECHNIQUE: Multidetector CT imaging of the abdomen and pelvis was performed following the standard protocol without IV contrast. RADIATION DOSE REDUCTION: This exam was performed according to the departmental dose-optimization program which includes automated exposure control, adjustment of the mA and/or kV according to patient size and/or use of iterative reconstruction technique. COMPARISON:  03/31/2023 FINDINGS: Lower chest: Cardiomegaly. Trace bilateral pleural effusions. Bibasilar airspace opacities likely reflect atelectasis. 1.4 cm right lower lobe nodule compared to 1.6 cm previously. Hepatobiliary: No focal hepatic abnormality. Gallbladder unremarkable. Pancreas: No focal abnormality or ductal dilatation. Spleen: No focal abnormality.  Normal size. Adrenals/Urinary Tract: Adrenal glands normal. No renal or adrenal stones. No hydronephrosis. Scattered low-density lesions within the kidneys most compatible with cysts. No follow-up imaging recommended. Foley catheter within the bladder, carcinoma. Stomach/Bowel: Stomach, large and small bowel grossly unremarkable. Umbilical hernia containing small bowel loops. No bowel obstruction. Vascular/Lymphatic: Aortic atherosclerosis. No evidence of aneurysm or adenopathy. Reproductive: Prior hysterectomy.  No adnexal masses. Other: Trace free fluid in the cul-de-sac.  No free air. Musculoskeletal: No acute bony abnormality. IMPRESSION: Trace bilateral pleural effusions. 1.4 cm right lower lobe pulmonary nodule, stable since prior study. This nodule has been present dating back to 2012 and was not avid on FDG imaging. No acute findings in the abdomen or pelvis. Umbilical hernia containing small bowel loops. No bowel obstruction. Aortic atherosclerosis. Electronically Signed   By: Charlett Nose  M.D.   On: 04/01/2023 18:12   ECHOCARDIOGRAM COMPLETE  Result Date: 04/01/2023    ECHOCARDIOGRAM REPORT   Patient Name:   Taiana Palmer Date of Exam:  04/01/2023 Medical Rec #:  [id number]   Height:       61.0 in Accession #:    [id number]  Weight:       187.2 lb Date of Birth:  21-Jul-1966   BSA:          1.836 m Patient Age:    56 years    BP:           86/52 mmHg Patient Gender: F           HR:           92 bpm. Exam Location:  [facility] Procedure: 2D Echo, 3D Echo, Cardiac Doppler, Color Doppler and Intracardiac            Opacification Agent Indications:     Abnormal ECG  History:         Patient has no prior history of Echocardiogram examinations.                  Abnormal ECG; Risk Factors:Diabetes. CKD, Sepsis.  Sonographer:     Mikki Harbor Referring Phys:  [id number] Ezequiel Essex Diagnosing Phys: [name] MD  Sonographer Comments: Technically difficult study due to poor echo windows. IMPRESSIONS  1. Left ventricular ejection fraction, by estimation, is 30 to 35%. Left ventricular ejection fraction by 2D MOD biplane is 38.4 %. Left ventricular ejection fraction by PLAX is 29 %. The left ventricle has moderately decreased function. The left ventricle demonstrates global hypokinesis. Left ventricular diastolic parameters are consistent with Grade I diastolic dysfunction (impaired relaxation).  2. Right ventricular systolic function is moderately reduced. The right ventricular size is moderately enlarged. There is mildly elevated pulmonary artery systolic pressure. The estimated right ventricular systolic pressure is 42.3 mmHg.  3. The mitral valve is normal in structure. Mild mitral valve regurgitation. No evidence of mitral stenosis.  4. Tricuspid valve regurgitation is mild to moderate.  5. The aortic valve is tricuspid. Aortic valve regurgitation is not visualized. No aortic stenosis is present.  6. The inferior vena cava is normal in size with <50% respiratory variability, suggesting right atrial  pressure of 8 mmHg. FINDINGS  Left Ventricle: Left ventricular ejection fraction, by estimation, is 30 to 35%. Left ventricular ejection fraction by PLAX is 29 %. Left ventricular ejection fraction by 2D MOD biplane is 38.4 %. The left ventricle has moderately decreased function. The left ventricle demonstrates global hypokinesis. Definity contrast agent was given IV to delineate the left ventricular endocardial borders. The left ventricular internal cavity size was normal in size. There is no left ventricular hypertrophy. Left ventricular diastolic parameters are consistent with Grade I diastolic dysfunction (impaired relaxation). Right Ventricle: The right ventricular size is moderately enlarged. No increase in right ventricular wall thickness. Right ventricular systolic function is moderately reduced. There is mildly elevated pulmonary artery systolic pressure. The tricuspid regurgitant velocity is 2.84 m/s, and with an assumed right atrial pressure of 10 mmHg, the estimated right ventricular systolic pressure is 42.3 mmHg. Left Atrium: Left atrial size was normal in size. Right Atrium: Right atrial size was normal in size. Pericardium: There is no evidence of pericardial effusion. Mitral Valve: The mitral valve is normal in structure. There is mild calcification of the mitral valve leaflet(s). Mild mitral valve regurgitation. No evidence of mitral valve stenosis. MV peak gradient, 3.0 mmHg. The mean mitral valve gradient is 2.0 mmHg. Tricuspid Valve: The tricuspid valve is normal in structure. Tricuspid valve regurgitation is mild to moderate. No evidence of tricuspid  stenosis. Aortic Valve: The aortic valve is tricuspid. Aortic valve regurgitation is not visualized. No aortic stenosis is present. Aortic valve mean gradient measures 6.0 mmHg. Aortic valve peak gradient measures 10.6 mmHg. Aortic valve area, by VTI measures 1.95  cm. Pulmonic Valve: The pulmonic valve was normal in structure. Pulmonic valve  regurgitation is not visualized. No evidence of pulmonic stenosis. Aorta: The aortic root is normal in size and structure. Venous: The inferior vena cava is normal in size with less than 50% respiratory variability, suggesting right atrial pressure of 8 mmHg. IAS/Shunts: No atrial level shunt detected by color flow Doppler.  LEFT VENTRICLE PLAX 2D                        Biplane EF (MOD) LV EF:         Left            LV Biplane EF:   Left                ventricular                      ventricular                ejection                         ejection                fraction by                      fraction by                PLAX is 29                       2D MOD                %.                               biplane is LVIDd:         4.50 cm                          38.4 %. LVIDs:         3.90 cm LV PW:         1.00 cm         Diastology LV IVS:        1.00 cm         LV e' medial:    8.70 cm/s LVOT diam:     1.90 cm         LV E/e' medial:  8.4 LV SV:         57              LV e' lateral:   10.60 cm/s LV SV Index:   31              LV E/e' lateral: 6.9 LVOT Area:     2.84 cm  LV Volumes (MOD) LV vol d, MOD    67.9 ml A2C: LV vol d, MOD    61.6 ml A4C: LV vol s, MOD    42.0 ml A2C: LV vol s, MOD    38.0 ml A4C: LV SV MOD  A2C:   25.9 ml LV SV MOD A4C:   61.6 ml LV SV MOD BP:    25.2 ml RIGHT VENTRICLE RV Basal diam:  3.45 cm RV Mid diam:    2.80 cm RV S prime:     15.20 cm/s TAPSE (M-mode): 1.6 cm LEFT ATRIUM             Index        RIGHT ATRIUM           Index LA diam:        3.70 cm 2.01 cm/m   RA Area:     23.70 cm LA Vol (A2C):   46.2 ml 25.16 ml/m  RA Volume:   75.80 ml  41.28 ml/m LA Vol (A4C):   36.1 ml 19.66 ml/m LA Biplane Vol: 42.9 ml 23.36 ml/m  AORTIC VALVE                     PULMONIC VALVE AV Area (Vmax):    1.76 cm      PV Vmax:       1.13 m/s AV Area (Vmean):   1.64 cm      PV Peak grad:  5.1 mmHg AV Area (VTI):     1.95 cm AV Vmax:           163.00 cm/s AV Vmean:          117.000  cm/s AV VTI:            0.291 m AV Peak Grad:      10.6 mmHg AV Mean Grad:      6.0 mmHg LVOT Vmax:         101.00 cm/s LVOT Vmean:        67.700 cm/s LVOT VTI:          0.200 m LVOT/AV VTI ratio: 0.69  AORTA Ao Root diam: 3.20 cm MITRAL VALVE               TRICUSPID VALVE MV Area (PHT): 5.38 cm    TR Peak grad:   32.3 mmHg MV Area VTI:   2.67 cm    TR Vmax:        284.00 cm/s MV Peak grad:  3.0 mmHg MV Mean grad:  2.0 mmHg    SHUNTS MV Vmax:       0.87 m/s    Systemic VTI:  0.20 m MV Vmean:      63.8 cm/s   Systemic Diam: 1.90 cm MV Decel Time: 141 msec MV E velocity: 73.40 cm/s MV A velocity: 87.00 cm/s MV E/A ratio:  0.84 [name] MD Electronically signed by [name] MD Signature Date/Time: 04/01/2023/1:49:48 PM    Final                LOS: 3 days   Floyd Lusignan  [facility]   Pager on [email or web address]. If 7PM-7AM, please contact night-coverage at [email or web address]     04/03/2023, 9:17 AM

## 2023-04-03 NOTE — Progress Notes (Signed)
Rounding Note    Patient Name: Sarah Lane Date of Encounter: 04/03/2023  [facility] Cardiologist: New  Subjective   Pt denies CP   Breathing is OK   Inpatient Medications    Scheduled Meds:  aspirin  81 mg Oral Daily   atorvastatin  40 mg Oral Daily   Chlorhexidine Gluconate Cloth  6 each Topical Daily   famotidine  20 mg Oral Daily   feeding supplement  237 mL Oral TID BM   heparin  5,000 Units Subcutaneous Q8H   insulin aspart  0-5 Units Subcutaneous QHS   insulin aspart  0-9 Units Subcutaneous TID WC   insulin glargine-yfgn  8 Units Subcutaneous Daily   midodrine  2.5 mg Oral BID WC   multivitamin  1 tablet Oral QHS   multivitamin with minerals  1 tablet Oral Daily   mupirocin ointment  1 Application Nasal BID   phosphorus  500 mg Oral Q4H   potassium chloride  20 mEq Oral Once   Continuous Infusions:  sodium chloride     sodium chloride Stopped (04/01/23 1722)   PRN Meds: dextrose, docusate sodium, heparin sodium (porcine), ipratropium-albuterol, loperamide, ondansetron (ZOFRAN) IV, mouth rinse, polyethylene glycol   Vital Signs    Vitals:   04/03/23 0530 04/03/23 0600 04/03/23 0700 04/03/23 0800  BP:  (!) 109/57 126/63 128/66  Pulse: 68 71 71 76  Resp: 15 17 18 17   Temp:    97.8 F (36.6 C)  TempSrc:    Oral  SpO2: 93% 93% 95% 95%  Weight:      Height:        Intake/Output Summary (Last 24 hours) at 04/03/2023 1036 Last data filed at 04/03/2023 1610 Gross per 24 hour  Intake 150 ml  Output 2350 ml  Net -2200 ml    Net 4 L positive       04/03/2023    4:07 AM 04/02/2023    4:57 AM 04/01/2023    5:00 AM  Last 3 Weights  Weight (lbs) 186 lb 11.7 oz 189 lb 13.1 oz 187 lb 2.7 oz  Weight (kg) 84.7 kg 86.1 kg 84.9 kg      Telemetry    SR  - Personally Reviewed  ECG    NO new  - Personally Reviewed  Physical Exam   GEN: No acute distress.   Neck: JVP is normal  Cardiac: RRR, no murmurs, Respiratory: Clear to  auscultation GI: Soft, nontender, non-distended   No hepatomegaly  MS: No edema; No deformity. 1+ DP  Neuro:  Deferred     Labs    High Sensitivity Troponin:   Recent Labs  Lab 03/31/23 1449 03/31/23 1751 03/31/23 2030 03/31/23 2308  TROPONINIHS 213* 295* 495* 475*     Chemistry Recent Labs  Lab 03/31/23 1103 03/31/23 1449 04/01/23 0406 04/01/23 1033 04/01/23 1913 04/02/23 0420 04/03/23 0418  NA 136   < > 138   < > 135 139 140  K 4.8   < > 3.7   < > 3.9 3.8 3.4*  CL 88*   < > 98   < > 99 102 104  CO2 <7*   < > 24   < > 23 24 26   GLUCOSE 152*   < > 132*   < > 151* 108* 99  BUN 73*   < > 36*   < > 26* 15 17  CREATININE 7.31*   < > 3.14*   < > 2.91* 1.92* 1.95*  CALCIUM 10.0   < > 8.1*   < > 7.9* 7.9* 8.3*  MG  --    < > 2.5*  --   --  2.2 2.1  PROT 8.5*  --  5.8*  --   --   --   --   ALBUMIN 4.1  --  2.9*  --  2.8*  --   --   AST 52*  --  26  --   --   --   --   ALT 35  --  21  --   --   --   --   ALKPHOS 61  --  38  --   --   --   --   BILITOT 1.2  --  1.1  --   --   --   --   GFRNONAA 6*   < > 17*   < > 18* 30* 29*  ANIONGAP NOT CALCULATED   < > 16*   < > 13 13 10    < > = values in this interval not displayed.    Lipids No results for input(s): "CHOL", "TRIG", "HDL", "LABVLDL", "LDLCALC", "CHOLHDL" in the last 168 hours.  Hematology Recent Labs  Lab 04/01/23 0406 04/02/23 0420 04/03/23 0418  WBC 11.5* 7.1 7.5  RBC 4.50 4.02 3.93  HGB 12.9 11.5* 11.3*  HCT 38.0 33.9* 33.9*  MCV 84.4 84.3 86.3  MCH 28.7 28.6 28.8  MCHC 33.9 33.9 33.3  RDW 13.2 13.5 13.4  PLT 192 126* 109*   Thyroid  Recent Labs  Lab 03/31/23 1449  TSH 0.924    BNPNo results for input(s): "BNP", "PROBNP" in the last 168 hours.  DDimer No results for input(s): "DDIMER" in the last 168 hours.   Radiology    CT ABDOMEN PELVIS WO CONTRAST  Result Date: 04/01/2023 CLINICAL DATA:  Right lower quadrant pain, elevated lipase, sepsis EXAM: CT ABDOMEN AND PELVIS WITHOUT CONTRAST  TECHNIQUE: Multidetector CT imaging of the abdomen and pelvis was performed following the standard protocol without IV contrast. RADIATION DOSE REDUCTION: This exam was performed according to the departmental dose-optimization program which includes automated exposure control, adjustment of the mA and/or kV according to patient size and/or use of iterative reconstruction technique. COMPARISON:  03/31/2023 FINDINGS: Lower chest: Cardiomegaly. Trace bilateral pleural effusions. Bibasilar airspace opacities likely reflect atelectasis. 1.4 cm right lower lobe nodule compared to 1.6 cm previously. Hepatobiliary: No focal hepatic abnormality. Gallbladder unremarkable. Pancreas: No focal abnormality or ductal dilatation. Spleen: No focal abnormality.  Normal size. Adrenals/Urinary Tract: Adrenal glands normal. No renal or adrenal stones. No hydronephrosis. Scattered low-density lesions within the kidneys most compatible with cysts. No follow-up imaging recommended. Foley catheter within the bladder, carcinoma. Stomach/Bowel: Stomach, large and small bowel grossly unremarkable. Umbilical hernia containing small bowel loops. No bowel obstruction. Vascular/Lymphatic: Aortic atherosclerosis. No evidence of aneurysm or adenopathy. Reproductive: Prior hysterectomy.  No adnexal masses. Other: Trace free fluid in the cul-de-sac.  No free air. Musculoskeletal: No acute bony abnormality. IMPRESSION: Trace bilateral pleural effusions. 1.4 cm right lower lobe pulmonary nodule, stable since prior study. This nodule has been present dating back to 2012 and was not avid on FDG imaging. No acute findings in the abdomen or pelvis. Umbilical hernia containing small bowel loops. No bowel obstruction. Aortic atherosclerosis. Electronically Signed   By: Charlett Nose M.D.   On: 04/01/2023 18:12   ECHOCARDIOGRAM COMPLETE  Result Date: 04/01/2023    ECHOCARDIOGRAM REPORT   Patient Name:  Sarah Lane Date of Exam: 04/01/2023 Medical Rec #:   [id number]   Height:       61.0 in Accession #:    [id number]  Weight:       187.2 lb Date of Birth:  [birth year]   BSA:          1.836 m Patient Age:    56 years    BP:           86/52 mmHg Patient Gender: F           HR:           92 bpm. Exam Location:  [facility] Procedure: 2D Echo, 3D Echo, Cardiac Doppler, Color Doppler and Intracardiac            Opacification Agent Indications:     Abnormal ECG  History:         Patient has no prior history of Echocardiogram examinations.                  Abnormal ECG; Risk Factors:Diabetes. CKD, Sepsis.  Sonographer:     Mikki Harbor Referring Phys:  [id number] Ezequiel Essex Diagnosing Phys: [name] MD  Sonographer Comments: Technically difficult study due to poor echo windows. IMPRESSIONS  1. Left ventricular ejection fraction, by estimation, is 30 to 35%. Left ventricular ejection fraction by 2D MOD biplane is 38.4 %. Left ventricular ejection fraction by PLAX is 29 %. The left ventricle has moderately decreased function. The left ventricle demonstrates global hypokinesis. Left ventricular diastolic parameters are consistent with Grade I diastolic dysfunction (impaired relaxation).  2. Right ventricular systolic function is moderately reduced. The right ventricular size is moderately enlarged. There is mildly elevated pulmonary artery systolic pressure. The estimated right ventricular systolic pressure is 42.3 mmHg.  3. The mitral valve is normal in structure. Mild mitral valve regurgitation. No evidence of mitral stenosis.  4. Tricuspid valve regurgitation is mild to moderate.  5. The aortic valve is tricuspid. Aortic valve regurgitation is not visualized. No aortic stenosis is present.  6. The inferior vena cava is normal in size with <50% respiratory variability, suggesting right atrial pressure of 8 mmHg. FINDINGS  Left Ventricle: Left ventricular ejection fraction, by estimation, is 30 to 35%. Left ventricular ejection fraction by PLAX is 29 %. Left ventricular  ejection fraction by 2D MOD biplane is 38.4 %. The left ventricle has moderately decreased function. The left ventricle demonstrates global hypokinesis. Definity contrast agent was given IV to delineate the left ventricular endocardial borders. The left ventricular internal cavity size was normal in size. There is no left ventricular hypertrophy. Left ventricular diastolic parameters are consistent with Grade I diastolic dysfunction (impaired relaxation). Right Ventricle: The right ventricular size is moderately enlarged. No increase in right ventricular wall thickness. Right ventricular systolic function is moderately reduced. There is mildly elevated pulmonary artery systolic pressure. The tricuspid regurgitant velocity is 2.84 m/s, and with an assumed right atrial pressure of 10 mmHg, the estimated right ventricular systolic pressure is 42.3 mmHg. Left Atrium: Left atrial size was normal in size. Right Atrium: Right atrial size was normal in size. Pericardium: There is no evidence of pericardial effusion. Mitral Valve: The mitral valve is normal in structure. There is mild calcification of the mitral valve leaflet(s). Mild mitral valve regurgitation. No evidence of mitral valve stenosis. MV peak gradient, 3.0 mmHg. The mean mitral valve gradient is 2.0 mmHg. Tricuspid Valve: The tricuspid valve is normal in structure. Tricuspid valve regurgitation is mild  to moderate. No evidence of tricuspid stenosis. Aortic Valve: The aortic valve is tricuspid. Aortic valve regurgitation is not visualized. No aortic stenosis is present. Aortic valve mean gradient measures 6.0 mmHg. Aortic valve peak gradient measures 10.6 mmHg. Aortic valve area, by VTI measures 1.95  cm. Pulmonic Valve: The pulmonic valve was normal in structure. Pulmonic valve regurgitation is not visualized. No evidence of pulmonic stenosis. Aorta: The aortic root is normal in size and structure. Venous: The inferior vena cava is normal in size with less  than 50% respiratory variability, suggesting right atrial pressure of 8 mmHg. IAS/Shunts: No atrial level shunt detected by color flow Doppler.  LEFT VENTRICLE PLAX 2D                        Biplane EF (MOD) LV EF:         Left            LV Biplane EF:   Left                ventricular                      ventricular                ejection                         ejection                fraction by                      fraction by                PLAX is 29                       2D MOD                %.                               biplane is LVIDd:         4.50 cm                          38.4 %. LVIDs:         3.90 cm LV PW:         1.00 cm         Diastology LV IVS:        1.00 cm         LV e' medial:    8.70 cm/s LVOT diam:     1.90 cm         LV E/e' medial:  8.4 LV SV:         57              LV e' lateral:   10.60 cm/s LV SV Index:   31              LV E/e' lateral: 6.9 LVOT Area:     2.84 cm  LV Volumes (MOD) LV vol d, MOD    67.9 ml A2C: LV vol d, MOD    61.6 ml A4C: LV vol s, MOD    42.0 ml A2C: LV vol s, MOD  38.0 ml A4C: LV SV MOD A2C:   25.9 ml LV SV MOD A4C:   61.6 ml LV SV MOD BP:    25.2 ml RIGHT VENTRICLE RV Basal diam:  3.45 cm RV Mid diam:    2.80 cm RV S prime:     15.20 cm/s TAPSE (M-mode): 1.6 cm LEFT ATRIUM             Index        RIGHT ATRIUM           Index LA diam:        3.70 cm 2.01 cm/m   RA Area:     23.70 cm LA Vol (A2C):   46.2 ml 25.16 ml/m  RA Volume:   75.80 ml  41.28 ml/m LA Vol (A4C):   36.1 ml 19.66 ml/m LA Biplane Vol: 42.9 ml 23.36 ml/m  AORTIC VALVE                     PULMONIC VALVE AV Area (Vmax):    1.76 cm      PV Vmax:       1.13 m/s AV Area (Vmean):   1.64 cm      PV Peak grad:  5.1 mmHg AV Area (VTI):     1.95 cm AV Vmax:           163.00 cm/s AV Vmean:          117.000 cm/s AV VTI:            0.291 m AV Peak Grad:      10.6 mmHg AV Mean Grad:      6.0 mmHg LVOT Vmax:         101.00 cm/s LVOT Vmean:        67.700 cm/s LVOT VTI:          0.200 m LVOT/AV  VTI ratio: 0.69  AORTA Ao Root diam: 3.20 cm MITRAL VALVE               TRICUSPID VALVE MV Area (PHT): 5.38 cm    TR Peak grad:   32.3 mmHg MV Area VTI:   2.67 cm    TR Vmax:        284.00 cm/s MV Peak grad:  3.0 mmHg MV Mean grad:  2.0 mmHg    SHUNTS MV Vmax:       0.87 m/s    Systemic VTI:  0.20 m MV Vmean:      63.8 cm/s   Systemic Diam: 1.90 cm MV Decel Time: 141 msec MV E velocity: 73.40 cm/s MV A velocity: 87.00 cm/s MV E/A ratio:  0.84 [name] MD Electronically signed by [name] MD Signature Date/Time: 04/01/2023/1:49:48 PM    Final     Cardiac Studies   Echo  Apr 01, 2023  1. Left ventricular ejection fraction, by estimation, is 30 to 35%. Left  ventricular ejection fraction by 2D MOD biplane is 38.4 %. Left  ventricular ejection fraction by PLAX is 29 %. The left ventricle has  moderately decreased function. The left  ventricle demonstrates global hypokinesis. Left ventricular diastolic  parameters are consistent with Grade I diastolic dysfunction (impaired  relaxation).   2. Right ventricular systolic function is moderately reduced. The right  ventricular size is moderately enlarged. There is mildly elevated  pulmonary artery systolic pressure. The estimated right ventricular  systolic pressure is 42.3 mmHg.   3. The mitral valve is normal in structure.  Mild mitral valve  regurgitation. No evidence of mitral stenosis.   4. Tricuspid valve regurgitation is mild to moderate.   5. The aortic valve is tricuspid. Aortic valve regurgitation is not  visualized. No aortic stenosis is present.   6. The inferior vena cava is normal in size with <50% respiratory  variability, suggesting right atrial pressure of 8 mmHg.   Patient Profile    Sarah Lane is a 57 y.o. female with a history of recent stroke, poorly controlled DM, recently dx cardiomyopathy & HFrEF (EF 15-20% by [facility] echo 01/2023), HTN, PSVT, remote seizures, and RLL pulm nodule, who is being seen today for the  evaluation of cardiomyopathy at the request of Dr. Belia Heman.   Assessment & Plan    1  HFrEF   Dx in June 2024 while at [facility]  LVEF 15 to 25%    Repeat echo on 04/01/23 LVEF 30 to 35%    Not on GDMT due to hypotension and renal dysfunction  No Jardiance due to acidosis Overall volume status appears OK  Etiology of LV dysfunction not determined  WOuld plan on R?L heart cath when pt's renal function normalizes    2  Blood pressure  Hx of HTN   ON admit was hypotensive   Midodrine started on admission   Was on 5 tid initially   Switched to 2.5 tid yesterday    Will continue to wean and follow BP       3  Hx of SVT   Self limited  Rates in 160 range    No recurrence  COntinue tele     4  Hx  REcent  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

## 2023-04-03 NOTE — Progress Notes (Signed)
[facility]  ROUNDING NOTE   Subjective:   Off CRRT.  UOP .   Patient states she is feeling better.   Objective:  Vital signs in last 24 hours:  Temp:  [97.3 F (36.3 C)-98.1 F (36.7 C)] 97.8 F (36.6 C) (08/24 0800) Pulse Rate:  [63-85] 76 (08/24 0800) Resp:  [15-24] 17 (08/24 0800) BP: (104-146)/(57-98) 128/66 (08/24 0800) SpO2:  [92 %-99 %] 95 % (08/24 0800) Weight:  [84.7 kg] 84.7 kg (08/24 0407)  Weight change: -1.4 kg Filed Weights   04/01/23 0500 04/02/23 0457 04/03/23 0407  Weight: 84.9 kg 86.1 kg 84.7 kg    Intake/Output: I/O last 3 completed shifts: In: 455.1 [P.O.:170; I.V.:35.1; IV Piggyback:250] Out: 2345 [Urine:2125]   Intake/Output this shift:  Total I/O In: -  Out: 1000 [Urine:1000]  Physical Exam: General: NAD, laying in bed  Head: Normocephalic, atraumatic. Moist oral mucosal membranes  Eyes: Anicteric  Lungs:  Clear to auscultation  Heart: Regular rate and rhythm  Abdomen:  Soft, nontender  Extremities: No peripheral edema.  Neurologic: Nonfocal, moving all four extremities  Skin: No lesions  Access: Lt femoral temp cath    Basic Metabolic Panel: Recent Labs  Lab 03/31/23 1752 03/31/23 2030 03/31/23 2308 04/01/23 0406 04/01/23 1033 04/01/23 1350 04/01/23 1913 04/02/23 0420 04/03/23 0418  NA  --   --    < > 138 137 132* 135 139 140  K  --   --    < > 3.7 4.4 3.7 3.9 3.8 3.4*  CL  --   --    < > 98 100 100 99 102 104  CO2  --   --    < > 24 26 25 23 24 26   GLUCOSE  --   --    < > 132* 142* 208* 151* 108* 99  BUN  --   --    < > 36* 33* 29* 26* 15 17  CREATININE  --   --    < > 3.14* 3.42* 3.02* 2.91* 1.92* 1.95*  CALCIUM  --   --    < > 8.1* 8.1* 7.7* 7.9* 7.9* 8.3*  MG 1.1* 1.1*  --  2.5*  --   --   --  2.2 2.1  PHOS 7.8*  --   --  2.0*  --   --  2.8 1.9*  --    < > = values in this interval not displayed.    Liver Function Tests: Recent Labs  Lab 03/31/23 1103 04/01/23 0406 04/01/23 1913  AST 52*  26  --   ALT 35 21  --   ALKPHOS 61 38  --   BILITOT 1.2 1.1  --   PROT 8.5* 5.8*  --   ALBUMIN 4.1 2.9* 2.8*   Recent Labs  Lab 03/31/23 1103 03/31/23 1449 04/01/23 0406 04/01/23 1033  LIPASE 240*  --  198*  --   AMYLASE  --  411*  --  517*   No results for input(s): "AMMONIA" in the last 168 hours.  CBC: Recent Labs  Lab 03/31/23 1005 03/31/23 1451 04/01/23 0406 04/02/23 0420 04/03/23 0418  WBC 15.0* 17.8* 11.5* 7.1 7.5  NEUTROABS 13.5*  --   --  4.8  --   HGB 17.3* 14.4 12.9 11.5* 11.3*  HCT 57.9* 45.4 38.0 33.9* 33.9*  MCV 94.3 89.9 84.4 84.3 86.3  PLT 316 303 192 126* 109*    Cardiac Enzymes: No results for input(s): "CKTOTAL", "CKMB", "CKMBINDEX", "  TROPONINI" in the last 168 hours.  BNP: Invalid input(s): "POCBNP"  CBG: Recent Labs  Lab 04/02/23 1124 04/02/23 1723 04/02/23 1941 04/02/23 2130 04/03/23 0742  GLUCAP 127* 149* 135* 119* 83    Microbiology: Results for orders placed or performed during the hospital encounter of 03/31/23  Resp panel by RT-PCR (RSV, Flu A&B, Covid) Anterior Nasal Swab     Status: None   Collection Time: 03/31/23 10:06 AM   Specimen: Anterior Nasal Swab  Result Value Ref Range Status   SARS Coronavirus 2 by RT PCR NEGATIVE NEGATIVE Final    Comment: (NOTE) SARS-CoV-2 target nucleic acids are NOT DETECTED.  The SARS-CoV-2 RNA is generally detectable in upper respiratory specimens during the acute phase of infection. The lowest concentration of SARS-CoV-2 viral copies this assay can detect is 138 copies/mL. A negative result does not preclude SARS-Cov-2 infection and should not be used as the sole basis for treatment or other patient management decisions. A negative result may occur with  improper specimen collection/handling, submission of specimen other than nasopharyngeal swab, presence of viral mutation(s) within the areas targeted by this assay, and inadequate number of viral copies(<138 copies/mL). A negative  result must be combined with clinical observations, patient history, and epidemiological information. The expected result is Negative.  Fact Sheet for Patients:  BloggerCourse.com  Fact Sheet for Healthcare Providers:  SeriousBroker.it  This test is no t yet approved or cleared by the [place] FDA and  has been authorized for detection and/or diagnosis of SARS-CoV-2 by FDA under an Emergency Use Authorization (EUA). This EUA will remain  in effect (meaning this test can be used) for the duration of the COVID-19 declaration under Section 564(b)(1) of the Act, 21 U.S.C.section 360bbb-3(b)(1), unless the authorization is terminated  or revoked sooner.       Influenza A by PCR NEGATIVE NEGATIVE Final   Influenza B by PCR NEGATIVE NEGATIVE Final    Comment: (NOTE) The Xpert Xpress SARS-CoV-2/FLU/RSV plus assay is intended as an aid in the diagnosis of influenza from Nasopharyngeal swab specimens and should not be used as a sole basis for treatment. Nasal washings and aspirates are unacceptable for Xpert Xpress SARS-CoV-2/FLU/RSV testing.  Fact Sheet for Patients: BloggerCourse.com  Fact Sheet for Healthcare Providers: SeriousBroker.it  This test is not yet approved or cleared by the [place] FDA and has been authorized for detection and/or diagnosis of SARS-CoV-2 by FDA under an Emergency Use Authorization (EUA). This EUA will remain in effect (meaning this test can be used) for the duration of the COVID-19 declaration under Section 564(b)(1) of the Act, 21 U.S.C. section 360bbb-3(b)(1), unless the authorization is terminated or revoked.     Resp Syncytial Virus by PCR NEGATIVE NEGATIVE Final    Comment: (NOTE) Fact Sheet for Patients: BloggerCourse.com  Fact Sheet for Healthcare Providers: SeriousBroker.it  This  test is not yet approved or cleared by the [place] FDA and has been authorized for detection and/or diagnosis of SARS-CoV-2 by FDA under an Emergency Use Authorization (EUA). This EUA will remain in effect (meaning this test can be used) for the duration of the COVID-19 declaration under Section 564(b)(1) of the Act, 21 U.S.C. section 360bbb-3(b)(1), unless the authorization is terminated or revoked.  Performed at [facility], [address] [place], [place] [address]   MRSA Next Gen by PCR, Nasal     Status: Abnormal   Collection Time: 03/31/23  2:23 PM   Specimen: Nasal Mucosa; Nasal Swab  Result Value  Ref Range Status   MRSA by PCR Next Gen DETECTED (A) NOT DETECTED Final    Comment: RESULT CALLED TO, READ BACK BY AND VERIFIED WITH: CHARLIE CORN 03/31/23 1546 KLW (NOTE) The GeneXpert MRSA Assay (FDA approved for NASAL specimens only), is one component of a comprehensive MRSA colonization surveillance program. It is not intended to diagnose MRSA infection nor to guide or monitor treatment for MRSA infections. Test performance is not FDA approved in patients less than 43 years old. Performed at [facility], [address] [place], [place] [address]   Blood culture (routine x 2)     Status: None (Preliminary result)   Collection Time: 03/31/23  2:49 PM   Specimen: BLOOD  Result Value Ref Range Status   Specimen Description BLOOD LEFT ANTECUBITAL  Final   Special Requests   Final    BOTTLES DRAWN AEROBIC AND ANAEROBIC Blood Culture results may not be optimal due to an inadequate volume of blood received in culture bottles   Culture   Final    NO GROWTH 3 DAYS Performed at [facility], [address] [place], [place] [address]    Report Status PENDING  Incomplete  Blood culture (routine x 2)     Status: None (Preliminary result)   Collection Time: 03/31/23  3:01 PM   Specimen: BLOOD  Result Value Ref Range Status   Specimen Description  BLOOD BLOOD LEFT HAND  Final   Special Requests   Final    BOTTLES DRAWN AEROBIC ONLY Blood Culture results may not be optimal due to an inadequate volume of blood received in culture bottles   Culture   Final    NO GROWTH 3 DAYS Performed at [facility], [address] [place], [place] [address]    Report Status PENDING  Incomplete  Urine Culture     Status: None   Collection Time: 03/31/23  5:04 PM   Specimen: Urine, Random  Result Value Ref Range Status   Specimen Description   Final    URINE, RANDOM Performed at [facility], [address], [place], [place] [address]    Special Requests   Final    NONE Performed at [facility], [address] [place], [place] [address]    Culture   Final    NO GROWTH Performed at [facility], [address], [place], [place] [address]    Report Status 04/02/2023 FINAL  Final    Coagulation Studies: No results for input(s): "LABPROT", "INR" in the last 72 hours.  Urinalysis: Recent Labs    03/31/23 1005  COLORURINE YELLOW*  LABSPEC 1.017  PHURINE 5.0  GLUCOSEU 50*  HGBUR MODERATE*  BILIRUBINUR NEGATIVE  KETONESUR 5*  PROTEINUR >=300*  NITRITE NEGATIVE  LEUKOCYTESUR SMALL*      Imaging: CT ABDOMEN PELVIS WO CONTRAST  Result Date: 04/01/2023 CLINICAL DATA:  Right lower quadrant pain, elevated lipase, sepsis EXAM: CT ABDOMEN AND PELVIS WITHOUT CONTRAST TECHNIQUE: Multidetector CT imaging of the abdomen and pelvis was performed following the standard protocol without IV contrast. RADIATION DOSE REDUCTION: This exam was performed according to the departmental dose-optimization program which includes automated exposure control, adjustment of the mA and/or kV according to patient size and/or use of iterative reconstruction technique. COMPARISON:  03/31/2023 FINDINGS: Lower chest: Cardiomegaly. Trace bilateral pleural effusions. Bibasilar airspace opacities likely reflect atelectasis. 1.4 cm right  lower lobe nodule compared to 1.6 cm previously. Hepatobiliary: No focal hepatic abnormality. Gallbladder unremarkable. Pancreas: No focal  abnormality or ductal dilatation. Spleen: No focal abnormality.  Normal size. Adrenals/Urinary Tract: Adrenal glands normal. No renal or adrenal stones. No hydronephrosis. Scattered low-density lesions within the kidneys most compatible with cysts. No follow-up imaging recommended. Foley catheter within the bladder, carcinoma. Stomach/Bowel: Stomach, large and small bowel grossly unremarkable. Umbilical hernia containing small bowel loops. No bowel obstruction. Vascular/Lymphatic: Aortic atherosclerosis. No evidence of aneurysm or adenopathy. Reproductive: Prior hysterectomy.  No adnexal masses. Other: Trace free fluid in the cul-de-sac.  No free air. Musculoskeletal: No acute bony abnormality. IMPRESSION: Trace bilateral pleural effusions. 1.4 cm right lower lobe pulmonary nodule, stable since prior study. This nodule has been present dating back to 2012 and was not avid on FDG imaging. No acute findings in the abdomen or pelvis. Umbilical hernia containing small bowel loops. No bowel obstruction. Aortic atherosclerosis. Electronically Signed   By: Charlett Nose M.D.   On: 04/01/2023 18:12   ECHOCARDIOGRAM COMPLETE  Result Date: 04/01/2023    ECHOCARDIOGRAM REPORT   Patient Name:   Stayce Kapuscinski Date of Exam: 04/01/2023 Medical Rec #:  [id number]   Height:       61.0 in Accession #:    [id number]  Weight:       187.2 lb Date of Birth:  Jul 15, 1966   BSA:          1.836 m Patient Age:    56 years    BP:           86/52 mmHg Patient Gender: F           HR:           92 bpm. Exam Location:  [facility] Procedure: 2D Echo, 3D Echo, Cardiac Doppler, Color Doppler and Intracardiac            Opacification Agent Indications:     Abnormal ECG  History:         Patient has no prior history of Echocardiogram examinations.                  Abnormal ECG; Risk Factors:Diabetes. CKD, Sepsis.   Sonographer:     Mikki Harbor Referring Phys:  [id number] Ezequiel Essex Diagnosing Phys: [name] MD  Sonographer Comments: Technically difficult study due to poor echo windows. IMPRESSIONS  1. Left ventricular ejection fraction, by estimation, is 30 to 35%. Left ventricular ejection fraction by 2D MOD biplane is 38.4 %. Left ventricular ejection fraction by PLAX is 29 %. The left ventricle has moderately decreased function. The left ventricle demonstrates global hypokinesis. Left ventricular diastolic parameters are consistent with Grade I diastolic dysfunction (impaired relaxation).  2. Right ventricular systolic function is moderately reduced. The right ventricular size is moderately enlarged. There is mildly elevated pulmonary artery systolic pressure. The estimated right ventricular systolic pressure is 42.3 mmHg.  3. The mitral valve is normal in structure. Mild mitral valve regurgitation. No evidence of mitral stenosis.  4. Tricuspid valve regurgitation is mild to moderate.  5. The aortic valve is tricuspid. Aortic valve regurgitation is not visualized. No aortic stenosis is present.  6. The inferior vena cava is normal in size with <50% respiratory variability, suggesting right atrial pressure of 8 mmHg. FINDINGS  Left Ventricle: Left ventricular ejection fraction, by estimation, is 30 to 35%. Left ventricular ejection fraction by PLAX is 29 %. Left ventricular ejection fraction by 2D MOD biplane is 38.4 %. The left ventricle has moderately decreased function. The left ventricle demonstrates global hypokinesis. Definity contrast agent was given  IV to delineate the left ventricular endocardial borders. The left ventricular internal cavity size was normal in size. There is no left ventricular hypertrophy. Left ventricular diastolic parameters are consistent with Grade I diastolic dysfunction (impaired relaxation). Right Ventricle: The right ventricular size is moderately enlarged. No increase in right  ventricular wall thickness. Right ventricular systolic function is moderately reduced. There is mildly elevated pulmonary artery systolic pressure. The tricuspid regurgitant velocity is 2.84 m/s, and with an assumed right atrial pressure of 10 mmHg, the estimated right ventricular systolic pressure is 42.3 mmHg. Left Atrium: Left atrial size was normal in size. Right Atrium: Right atrial size was normal in size. Pericardium: There is no evidence of pericardial effusion. Mitral Valve: The mitral valve is normal in structure. There is mild calcification of the mitral valve leaflet(s). Mild mitral valve regurgitation. No evidence of mitral valve stenosis. MV peak gradient, 3.0 mmHg. The mean mitral valve gradient is 2.0 mmHg. Tricuspid Valve: The tricuspid valve is normal in structure. Tricuspid valve regurgitation is mild to moderate. No evidence of tricuspid stenosis. Aortic Valve: The aortic valve is tricuspid. Aortic valve regurgitation is not visualized. No aortic stenosis is present. Aortic valve mean gradient measures 6.0 mmHg. Aortic valve peak gradient measures 10.6 mmHg. Aortic valve area, by VTI measures 1.95  cm. Pulmonic Valve: The pulmonic valve was normal in structure. Pulmonic valve regurgitation is not visualized. No evidence of pulmonic stenosis. Aorta: The aortic root is normal in size and structure. Venous: The inferior vena cava is normal in size with less than 50% respiratory variability, suggesting right atrial pressure of 8 mmHg. IAS/Shunts: No atrial level shunt detected by color flow Doppler.  LEFT VENTRICLE PLAX 2D                        Biplane EF (MOD) LV EF:         Left            LV Biplane EF:   Left                ventricular                      ventricular                ejection                         ejection                fraction by                      fraction by                PLAX is 29                       2D MOD                %.                               biplane is  LVIDd:         4.50 cm                          38.4 %. LVIDs:  3.90 cm LV PW:         1.00 cm         Diastology LV IVS:        1.00 cm         LV e' medial:    8.70 cm/s LVOT diam:     1.90 cm         LV E/e' medial:  8.4 LV SV:         57              LV e' lateral:   10.60 cm/s LV SV Index:   31              LV E/e' lateral: 6.9 LVOT Area:     2.84 cm  LV Volumes (MOD) LV vol d, MOD    67.9 ml A2C: LV vol d, MOD    61.6 ml A4C: LV vol s, MOD    42.0 ml A2C: LV vol s, MOD    38.0 ml A4C: LV SV MOD A2C:   25.9 ml LV SV MOD A4C:   61.6 ml LV SV MOD BP:    25.2 ml RIGHT VENTRICLE RV Basal diam:  3.45 cm RV Mid diam:    2.80 cm RV S prime:     15.20 cm/s TAPSE (M-mode): 1.6 cm LEFT ATRIUM             Index        RIGHT ATRIUM           Index LA diam:        3.70 cm 2.01 cm/m   RA Area:     23.70 cm LA Vol (A2C):   46.2 ml 25.16 ml/m  RA Volume:   75.80 ml  41.28 ml/m LA Vol (A4C):   36.1 ml 19.66 ml/m LA Biplane Vol: 42.9 ml 23.36 ml/m  AORTIC VALVE                     PULMONIC VALVE AV Area (Vmax):    1.76 cm      PV Vmax:       1.13 m/s AV Area (Vmean):   1.64 cm      PV Peak grad:  5.1 mmHg AV Area (VTI):     1.95 cm AV Vmax:           163.00 cm/s AV Vmean:          117.000 cm/s AV VTI:            0.291 m AV Peak Grad:      10.6 mmHg AV Mean Grad:      6.0 mmHg LVOT Vmax:         101.00 cm/s LVOT Vmean:        67.700 cm/s LVOT VTI:          0.200 m LVOT/AV VTI ratio: 0.69  AORTA Ao Root diam: 3.20 cm MITRAL VALVE               TRICUSPID VALVE MV Area (PHT): 5.38 cm    TR Peak grad:   32.3 mmHg MV Area VTI:   2.67 cm    TR Vmax:        284.00 cm/s MV Peak grad:  3.0 mmHg MV Mean grad:  2.0 mmHg    SHUNTS MV Vmax:       0.87 m/s    Systemic VTI:  0.20 m MV Vmean:  63.8 cm/s   Systemic Diam: 1.90 cm MV Decel Time: 141 msec MV E velocity: 73.40 cm/s MV A velocity: 87.00 cm/s MV E/A ratio:  0.84 [name] MD Electronically signed by [name] MD Signature Date/Time: 04/01/2023/1:49:48 PM     Final      Medications:    sodium chloride     sodium chloride Stopped (04/01/23 1722)    aspirin  81 mg Oral Daily   atorvastatin  40 mg Oral Daily   Chlorhexidine Gluconate Cloth  6 each Topical Daily   famotidine  20 mg Oral Daily   feeding supplement  237 mL Oral TID BM   heparin  5,000 Units Subcutaneous Q8H   insulin aspart  0-5 Units Subcutaneous QHS   insulin aspart  0-9 Units Subcutaneous TID WC   insulin glargine-yfgn  8 Units Subcutaneous Daily   midodrine  2.5 mg Oral BID WC   multivitamin  1 tablet Oral QHS   multivitamin with minerals  1 tablet Oral Daily   mupirocin ointment  1 Application Nasal BID   phosphorus  500 mg Oral Q4H   potassium chloride  20 mEq Oral Once   dextrose, docusate sodium, heparin sodium (porcine), ipratropium-albuterol, loperamide, ondansetron (ZOFRAN) IV, mouth rinse, polyethylene glycol  Assessment/ Plan:  Ms. Faustine Ferreri is a 57 y.o.  female with diabetes mellitus type II, seizure disorder, hypertension, history of ovarian cancer who is admitted to [facility] on 03/31/2023 for Sepsis [facility]) [A41.9]  Acute kidney injury with history proteinuria: Baseline creatinine was 0.76 on 02/19/2023. Serologic work up negative on 8/22. No obstruction on CT. No IV contrast exposure. Required CRRT. Now with nonoliguric urine output.  - Continue to monitor for renal improvement. No indication for further renal replacement therapy.  - holding home metformin.   - holding empagliflozin - holding furosemide  Lab Results  Component Value Date   CREATININE 1.95 (H) 04/03/2023   CREATININE 1.92 (H) 04/02/2023   CREATININE 2.91 (H) 04/01/2023    Intake/Output Summary (Last 24 hours) at 04/03/2023 0956 Last data filed at 04/03/2023 0907 Gross per 24 hour  Intake 150 ml  Output 2350 ml  Net -2200 ml   2. Hypokalemia:   - replace  3. 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 Not well controlled, hemoglobin A1c of  8.2%.   - holding metformin and empagliflozin  4. Hypertension, essential: now with hypotension on midodrine. Required vasopressors this admission. Home regimen of metoprolol and furosemide.    LOS: 3 Jamey Harman 8/24/20249:56 AM

## 2023-04-03 NOTE — Evaluation (Signed)
Occupational Therapy Evaluation Patient Details Name: Sarah Lane MRN: [id number] DOB: 1966/06/17 Today's Date: 04/03/2023   History of Present Illness Pt is a 57 year old female presenting to ED, admitted with euglycemic DKA suspect secondary to SGLT 2 requiring insulin gtt, acute metabolic encephalopathy, sepsis secondary to possible acute pancreatitis and/or UTI, severe lactic/metabolic acidosis, and acute kidney injury    PMH significant for type II diabetes mellitus, seizures, HTN, dysrhythmia, and ovarian cancer, recent stroke in June 2024 in L MCA territory   Clinical Impression   Chart reviewed, pt greeted in room, oriented to self and place. Pt with word finding difficulties on this date, increased time required for processing, with slight impulsivity noted/decreased awareness of deficits. PTA pt reports she lives with a roommate and was amb with a rollator since discharge from hospital in June 2024. Pt reports she has had no therapy follow up. Pt reports she is generally MOD I with ADL, per chart ?med management compliance. Pt presents with deficits in RUE function, strength, endurance, balance, cognition all affecting safe and optimal ADL completion. Pt will benefit from ongoing OT to address deficits and to facilitate optimal, safe ADL completion. OT will continue to follow acutely.       If plan is discharge home, recommend the following: A little help with walking and/or transfers;A little help with bathing/dressing/bathroom;Direct supervision/assist for financial management;Help with stairs or ramp for entrance;Assist for transportation;Direct supervision/assist for medications management;Assistance with cooking/housework;Supervision due to cognitive status    Functional Status Assessment  Patient has had a recent decline in their functional status and demonstrates the ability to make significant improvements in function in a reasonable and predictable amount of time.  Equipment  Recommendations  BSC/3in1;Other (comment) (2WW)    Recommendations for Other Services       Precautions / Restrictions Precautions Precautions: Fall Restrictions Weight Bearing Restrictions: No      Mobility Bed Mobility Overal bed mobility: Needs Assistance Bed Mobility: Supine to Sit, Sit to Supine     Supine to sit: Supervision, HOB elevated Sit to supine: Min assist, HOB elevated   General bed mobility comments: management of BLE    Transfers Overall transfer level: Needs assistance Equipment used: Rolling walker (2 wheels) Transfers: Sit to/from Stand Sit to Stand: Min assist           General transfer comment: intermittent vcs for safety/technique      Balance Overall balance assessment: Needs assistance Sitting-balance support: Feet supported Sitting balance-Leahy Scale: Good     Standing balance support: Bilateral upper extremity supported, During functional activity Standing balance-Leahy Scale: Fair                             ADL either performed or assessed with clinical judgement   ADL Overall ADL's : Needs assistance/impaired Eating/Feeding: Set up;Sitting   Grooming: Wash/dry hands;Wash/dry face;Sitting;Set up           Upper Body Dressing : Minimal assistance;Sitting Upper Body Dressing Details (indicate cue type and reason): anticipate Lower Body Dressing: Minimal assistance;Sitting/lateral leans Lower Body Dressing Details (indicate cue type and reason): socks Toilet Transfer: Minimal assistance;Rolling walker (2 wheels);Ambulation Toilet Transfer Details (indicate cue type and reason): simulated in room with frequent vcs for technique         Functional mobility during ADLs: Minimal assistance;Rolling walker (2 wheels) (approx 8' in room with RW)       Vision Patient Visual Report: No change  from baseline Additional Comments: will continue to assess     Perception Perception: Within Functional Limits        Praxis Praxis:  (will continue to assess)       Pertinent Vitals/Pain Pain Assessment Pain Assessment: No/denies pain     Extremity/Trunk Assessment Upper Extremity Assessment Upper Extremity Assessment: Right hand dominant;RUE deficits/detail RUE Deficits / Details: AROM; shoulder flexion to approx 3/4 full AROM, elbow,wrist appear WFL; mildly weak grip strength with imparied FMC/dexterity noted RUE Coordination: decreased fine motor;decreased gross motor   Lower Extremity Assessment Lower Extremity Assessment: Defer to PT evaluation;Generalized weakness   Cervical / Trunk Assessment Cervical / Trunk Assessment: Normal   Communication Communication Communication: Difficulty following commands/understanding;Difficulty communicating thoughts/reduced clarity of speech (significant word finding difficulties) Following commands: Follows one step commands with increased time;Follows multi-step commands inconsistently Cueing Techniques: Verbal cues;Tactile cues;Visual cues   Cognition Arousal: Alert Behavior During Therapy: Impulsive Overall Cognitive Status: No family/caregiver present to determine baseline cognitive functioning Area of Impairment: Attention, Memory, Following commands, Safety/judgement, Awareness, Problem solving, Orientation                 Orientation Level: Disoriented to, Situation, Time Current Attention Level: Sustained   Following Commands: Follows one step commands with increased time Safety/Judgement: Decreased awareness of deficits Awareness: Emergent Problem Solving: Slow processing, Requires verbal cues, Requires tactile cues       General Comments  vss throughout    Exercises     Shoulder Instructions      Home Living Family/patient expects to be discharged to:: Private residence Living Arrangements: Non-relatives/Friends Available Help at Discharge: Friend(s) Type of Home: Mobile home       Home Layout: One level      Bathroom Shower/Tub: Tub/shower unit         Home Equipment: Rollator (4 wheels);BSC/3in1          Prior Functioning/Environment Prior Level of Function : Needs assist       Physical Assist : ADLs (physical);Mobility (physical) Mobility (physical): Gait ADLs (physical): IADLs Mobility Comments: amb with rollator househols distances that pt says she privately acquired ADLs Comments: pt reports she generally completes ADLs with MOD I however per chart ?medication complance        OT Problem List: Impaired balance (sitting and/or standing);Decreased cognition;Decreased safety awareness;Decreased activity tolerance;Decreased knowledge of use of DME or AE;Impaired UE functional use;Decreased range of motion      OT Treatment/Interventions: Self-care/ADL training;Therapeutic exercise;Patient/family education;Balance training;Energy conservation;Therapeutic activities;DME and/or AE instruction    OT Goals(Current goals can be found in the care plan section) Acute Rehab OT Goals Patient Stated Goal: feel stronger OT Goal Formulation: With patient Time For Goal Achievement: 04/17/23 Potential to Achieve Goals: Good ADL Goals Pt Will Perform Grooming: with modified independence Pt Will Perform Lower Body Dressing: with modified independence Pt Will Transfer to Toilet: with modified independence;ambulating Pt Will Perform Toileting - Clothing Manipulation and hygiene: with modified independence;sit to/from stand Pt/caregiver will Perform Home Exercise Program: Right Upper extremity;With written HEP provided  OT Frequency: Min 1X/week    Co-evaluation              AM-PAC OT "6 Clicks" Daily Activity     Outcome Measure Help from another person eating meals?: None Help from another person taking care of personal grooming?: None Help from another person toileting, which includes using toliet, bedpan, or urinal?: A Little Help from another person bathing (including washing,  rinsing, drying)?: A Little Help  from another person to put on and taking off regular upper body clothing?: None Help from another person to put on and taking off regular lower body clothing?: A Little 6 Click Score: 21   End of Session Equipment Utilized During Treatment: Gait belt Nurse Communication: Mobility status  Activity Tolerance: Patient tolerated treatment well Patient left: in bed;with call bell/phone within reach  OT Visit Diagnosis: Other abnormalities of gait and mobility (R26.89);Hemiplegia and hemiparesis Hemiplegia - Right/Left: Right Hemiplegia - dominant/non-dominant: Dominant Hemiplegia - caused by: Cerebral infarction (from June 2024)                Time: 2952-8413 OT Time Calculation (min): 21 min Charges:  OT General Charges $OT Visit: 1 Visit OT Evaluation $OT Eval Moderate Complexity: 1 Mod  Oleta Mouse, OTD OTR/L  04/03/23, 4:26 PM

## 2023-04-03 NOTE — Plan of Care (Signed)
  Problem: Education: Goal: Knowledge of General Education information will improve Description: Including pain rating scale, medication(s)/side effects and non-pharmacologic comfort measures Outcome: Progressing   Problem: Clinical Measurements: Goal: Ability to maintain clinical measurements within normal limits will improve Outcome: Progressing   Problem: Clinical Measurements: Goal: Will remain free from infection Outcome: Progressing   Problem: Clinical Measurements: Goal: Diagnostic test results will improve Outcome: Progressing   Problem: Clinical Measurements: Goal: Respiratory complications will improve Outcome: Progressing   Problem: Clinical Measurements: Goal: Cardiovascular complication will be avoided Outcome: Progressing   Problem: Activity: Goal: Risk for activity intolerance will decrease Outcome: Progressing   Problem: Nutrition: Goal: Adequate nutrition will be maintained Outcome: Progressing   Problem: Coping: Goal: Level of anxiety will decrease Outcome: Progressing   Problem: Elimination: Goal: Will not experience complications related to urinary retention Outcome: Progressing   Problem: Pain Managment: Goal: General experience of comfort will improve Outcome: Progressing   Problem: Safety: Goal: Ability to remain free from injury will improve Outcome: Progressing   Problem: Education: Goal: Ability to describe self-care measures that may prevent or decrease complications (Diabetes Survival Skills Education) will improve Outcome: Progressing   Problem: Metabolic: Goal: Ability to maintain appropriate glucose levels will improve Outcome: Progressing

## 2023-04-03 NOTE — Progress Notes (Signed)
PHARMACY CONSULT NOTE  Pharmacy Consult for Electrolyte Monitoring and Replacement   Recent Labs: Potassium (mmol/L)  Date Value  04/03/2023 3.4 (L)   Magnesium (mg/dL)  Date Value  91/47/8295 2.1   Calcium (mg/dL)  Date Value  62/13/0865 8.3 (L)   Albumin (g/dL)  Date Value  78/46/9629 2.8 (L)   Phosphorus (mg/dL)  Date Value  52/84/1324 1.9 (L)   Sodium (mmol/L)  Date Value  04/03/2023 140   Corrected Ca: 8.98 mg/dL  Assessment: Sarah Lane is a 57 y.o. female with PMH including DM, seizures, HTN, dysrhythmia, ovarian cancer admitted on 03/31/2023 with  euglycemic DKA, sepsis secondary to acute pancreatitis, severe lactic acidosis, AKI, now on CRRT. Pharmacy consulted to assist with electrolyte monitoring and replacement as indicated.  Goal of Therapy:  Electrolytes within normal limits  Plan:  --Kphos 500mg  PO x 1 (contains phosphorus 16 mMol, potassium 2.2 mEq) --20 mEq po KCl x 1 --Pharmacy will continue to follow along  Lowella Bandy, PharmD Clinical Pharmacist 04/03/2023 7:42 AM

## 2023-04-04 DIAGNOSIS — I5020 Unspecified systolic (congestive) heart failure: Secondary | ICD-10-CM

## 2023-04-04 DIAGNOSIS — N179 Acute kidney failure, unspecified: Secondary | ICD-10-CM | POA: Diagnosis not present

## 2023-04-04 LAB — CBC
HCT: 38.4 % (ref 36.0–46.0)
Hemoglobin: 12.8 g/dL (ref 12.0–15.0)
MCH: 28.3 pg (ref 26.0–34.0)
MCHC: 33.3 g/dL (ref 30.0–36.0)
MCV: 84.8 fL (ref 80.0–100.0)
Platelets: 140 10*3/uL — ABNORMAL LOW (ref 150–400)
RBC: 4.53 MIL/uL (ref 3.87–5.11)
RDW: 13.2 % (ref 11.5–15.5)
WBC: 7.5 10*3/uL (ref 4.0–10.5)
nRBC: 0 % (ref 0.0–0.2)

## 2023-04-04 LAB — BASIC METABOLIC PANEL
Anion gap: 8 (ref 5–15)
BUN: 18 mg/dL (ref 6–20)
CO2: 26 mmol/L (ref 22–32)
Calcium: 8.3 mg/dL — ABNORMAL LOW (ref 8.9–10.3)
Chloride: 106 mmol/L (ref 98–111)
Creatinine, Ser: 1.91 mg/dL — ABNORMAL HIGH (ref 0.44–1.00)
GFR, Estimated: 30 mL/min — ABNORMAL LOW (ref >60)
Glucose, Bld: 106 mg/dL — ABNORMAL HIGH (ref 70–99)
Potassium: 3.4 mmol/L — ABNORMAL LOW (ref 3.5–5.1)
Sodium: 140 mmol/L (ref 135–145)

## 2023-04-04 LAB — PHOSPHORUS: Phosphorus: 3.1 mg/dL (ref 2.5–4.6)

## 2023-04-04 LAB — GLUCOSE, CAPILLARY
Glucose-Capillary: 93 mg/dL (ref 70–99)
Glucose-Capillary: 130 mg/dL — ABNORMAL HIGH (ref 70–99)
Glucose-Capillary: 142 mg/dL — ABNORMAL HIGH (ref 70–99)
Glucose-Capillary: 105 mg/dL — ABNORMAL HIGH (ref 70–99)

## 2023-04-04 LAB — MAGNESIUM: Magnesium: 1.7 mg/dL (ref 1.7–2.4)

## 2023-04-04 MED ORDER — MAGNESIUM SULFATE 2 GM/50ML IV SOLN
2.0000 g | Freq: Once | INTRAVENOUS | Status: AC
  Administered 2023-04-04: 2 g via INTRAVENOUS
  Filled 2023-04-04: qty 50

## 2023-04-04 MED ORDER — POTASSIUM CHLORIDE CRYS ER 20 MEQ PO TBCR
20.0000 meq | EXTENDED_RELEASE_TABLET | Freq: Once | ORAL | Status: AC
  Administered 2023-04-04: 20 meq via ORAL
  Filled 2023-04-04: qty 1

## 2023-04-04 MED ORDER — METOPROLOL SUCCINATE ER 25 MG PO TB24
25.0000 mg | ORAL_TABLET | Freq: Every day | ORAL | Status: DC
  Administered 2023-04-04 – 2023-04-06 (×3): 25 mg via ORAL
  Filled 2023-04-04 (×3): qty 1

## 2023-04-04 NOTE — Evaluation (Addendum)
Speech Language Pathology Evaluation Patient Details Name: Sarah Lane MRN: [id number] DOB: Jan 31, 1966 Today's Date: 04/04/2023 Time: 8657-8469 SLP Time Calculation (min) (ACUTE ONLY): 26 min  Problem List:  Patient Active Problem List   Diagnosis Date Noted   HFrEF (heart failure with reduced ejection fraction) (HCC) 04/04/2023   Sepsis (HCC) 03/31/2023   Acute renal failure (HCC) 03/31/2023   Diabetes (HCC) 12/31/2014   Carbuncle of abdominal wall May 2016 12/31/2014   Past Medical History:  Past Medical History:  Diagnosis Date   Aortic atherosclerosis (HCC)    a. 03/2023 noted on CT.   Asthma    childhood asthma   Cancer (HCC)    ovarian cancer   Cardiomyopathy (HCC)    a. 01/2023 [facility]): EF 15-20%, impaired relaxation, mildly to mod dil RV w/ mild RV dysfxn, mod PH, BAE; b. 03/2023 Echo: EF 30-35%, glob HK, GrI DD, mod reduced RV fxn, mod enlarged RV, RVSP 42.72mmHg, mild MR, mild-mod TR.   Chronic HFrEF (heart failure with reduced ejection fraction) (HCC)    a. Dx 01/2023-->[facility]: EF 15-20%; b. 03/2023 Echo: EF 30-35%.   Diabetes (HCC)    Diabetes mellitus without complication (HCC)    Expressive aphasia    a. 01/2023 L MCA occlusive stroke s/p thrombectomy.   Family history of adverse reaction to anesthesia    Grand father had facial swelling   H/O ischemic left MCA stroke    a. 01/2023 s/p admission @ [facility]. CTA Head w/ M1 occlusion of L MCA, possible high-grade stenosis of distal A3 segment. S/p thrombectomy.   Hypertension    PSVT (paroxysmal supraventricular tachycardia)    Right lower lobe pulmonary nodule    a. 03/2023 CT Chest - 14mm - stable.   Seizures (HCC)    petit mals at age 45 treated with medications. Last one age 89   Past Surgical History:  Past Surgical History:  Procedure Laterality Date   ABDOMINAL HYSTERECTOMY  2011   ovarian cancer   IRRIGATION AND DEBRIDEMENT ABDOMEN N/A 12/31/2014   Procedure: IRRIGATION AND DEBRIDEMENT ABDOMINAL WALL ABSCESS;   Surgeon: Luretha Murphy, MD;  Location: [facility] ORS;  Service: General;  Laterality: N/A;   HPI:  PT is a 57 yo female who She presented to [facility] ER on 08/21 via EMS with c/o nausea/vomiting/diarrhea/abdominal pain with inability to tolerate po's. Onset of symptoms the night of 08/20. Pt with a PMH of type II diabetes mellitus, seizures, stroke, HTN, dysrhythmia, and ovarian cancer. Head CT on admission, "1. No acute intracranial abnormality.  2. 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  3. Mild microvascular ischemic changes." CXR on admission, "1. Right lower lobe pulmonary nodule, seen on prior CT chest.  2. No consolidation." Abdominal CT on admission, "1. Suboptimal exam.  2. No acute inflammatory process identified within the abdomen or  pelvis. No discrete metastatic disease identified.  3. Multiple other nonacute observations, as described above."   Assessment / Plan / Recommendation Clinical Impression  Pt seen for speech/language evaluation. Evaluation completed via dynamic assessment and portions of Western Aphasia Battery Revised - Bedside Record Form. 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 Pt stimulable to verbal hierarchical cueing and very motivated to participate in SLP services. Pt with emerging use of gestures and circumlocution to repair communication breakdowns. Pt with good error awareness. A full cognitive-linguistic evaluation was deferred at this time given speech/language deficits; however, pt  with observed difficulty using call bell and dialing telephone to order lunch suggesting reduced functional problem solving. Suspect difficulty with iADLs. Per pt, pt did not received SLP services upon d/c from OSH in June.    SLP Assessment  SLP Recommendation/Assessment: Patient needs continued Speech Lanaguage Pathology Services SLP Visit Diagnosis: Aphasia (R47.01);Dysarthria and  anarthria (R47.1);Apraxia (R48.2);Cognitive communication deficit (R41.841)    Recommendations for follow up therapy are one component of a multi-disciplinary discharge planning process, led by the attending physician.  Recommendations may be updated based on patient status, additional functional criteria and insurance authorization.    Follow Up Recommendations  Acute inpatient rehab (3hours/day)    Assistance Recommended at Discharge  Frequent or constant Supervision/Assistance  Functional Status Assessment Patient has had a recent decline in their functional status and demonstrates the ability to make significant improvements in function in a reasonable and predictable amount of time.  Frequency and Duration min 2x/week  2 weeks      SLP Evaluation Cognition  Overall Cognitive Status: No family/caregiver present to determine baseline cognitive functioning Orientation Level: Oriented X4 Problem Solving: Impaired Problem Solving Impairment: Functional basic (difficulty using call bell and calling to order lunch) Comments: full cognitive-linguistic eval deferred due to aphasia       Comprehension  Auditory Comprehension Overall Auditory Comprehension: Appears within functional limits for tasks assessed Yes/No Questions: Within Functional Limits Commands: Within Functional Limits Conversation: Simple (with extra time; occasional repetition of stimuli) EffectiveTechniques: Extra processing time;Repetition    Expression Expression Primary Mode of Expression: Verbal Verbal Expression Overall Verbal Expression: Impaired Initiation: No impairment Automatic Speech: Name;Social Response;Counting (WFL) Repetition: Impaired Level of Impairment: Phrase level;Sentence level Naming: Impairment Responsive: 76-100% accurate (nouns and verbs) Confrontation: Impaired (7/10 common objects) Divergent:  (8 animals in 60s) Verbal Errors: Semantic paraphasias;Phonemic paraphasias Pragmatics:  Impairment Impairments: Topic maintenance (due to aphasia) Effective Techniques: Semantic cues;Phonemic cues;Sentence completion   Oral / Motor  Oral Motor/Sensory Function Overall Oral Motor/Sensory Function: Mild impairment Facial Symmetry: Abnormal symmetry right Motor Speech Overall Motor Speech: Appears within functional limits for tasks assessed Respiration: Within functional limits Phonation: Normal Resonance: Within functional limits Articulation: Impaired (minimal articulatory imprecision) Intelligibility: Intelligible Motor Planning: Witnin functional limits           Clyde Canterbury, M.S., CCC-SLP Speech-Language Pathologist [facility] [phone] Arnette Felts)  Woodroe Chen 04/04/2023, 11:52 AM

## 2023-04-04 NOTE — Progress Notes (Signed)
Progress Note    Sarah Lane  NFA:[id number] DOB: 1966-04-03  DOA: 03/31/2023 PCP: [facility]      Brief Narrative:    Medical records reviewed and are as summarized below:  Sarah Lane is a 57 y.o. female  with  PMH of type II diabetes mellitus, seizures, HTN, dysrhythmia, and ovarian cancer.  She presented to [facility] ER on 08/21 via EMS with c/o nausea/vomiting/diarrhea/abdominal pain with inability to tolerate po's.  Onset of symptoms the night of 08/20.  Pt reports her CBG's have been running "High"although she has been taking and tolerating her metformin, jardiance, and insulin.  Upon EMS arrival at pts home she was hypoglycemic with CBG's in the 30's. She received 250 ml bolus of D10W with CBG increasing to 86.  She reports she is living with friends and has had contact with someone recently diagnosed with COVID.       ED Course  Upon arrival to the ER pt confused with weakness, dry mouth, and shortness of breath.  CXR concerning for right lower lobe pulmonary nodule seen on previous CT Chest, but negative for consolidation.  COVID/Influenza A&B/RSV negative.  Significant lab results were: chloride 88/CO2 <7/glucose 152/BUN 73/creatinine 7.31/anion gap not calculated/lipase 240/AST 52/total protein 8.5/lactic acid >9.0/wbc 15.0/beta-hydroxy >8.00.  UA results were: glucose 50/hgb moderate/ketones 5/leukocytes small/protein >= 300/bacteria rare/wbc 21-50.  Pt met sepsis criteria and received: 3L NS bolus/1L LR bolus/1 amp of sodium bicarb.  Cefepime, vancomycin, and metronidazole ordered per EDP.  PCCM team contacted for ICU admission.   CT Abd Pelvis without oral or iv contrast: Suboptimal exam. No acute inflammatory process identified within the abdomen or pelvis. No discrete metastatic disease identified. Multiple other nonacute observations, as described above. Aortic Atherosclerosis (ICD10-I70.0).   CT Head WO Contrast: No acute intracranial abnormality. Old left  external capsule/left basal ganglia and right parietal lobe infarctions. Mild microvascular ischemic changes.   Significant Hospital Events: Including procedures, antibiotic start and stop dates in addition to other pertinent events   08/21: Pt admitted with euglycemic DKA suspect secondary to SGLT 2 requiring insulin gtt, acute metabolic encephalopathy, sepsis secondary to possible acute pancreatitis and/or UTI, severe lactic/metabolic acidosis, and acute kidney injury.  Nephrology consulted and CRRT initiated  08/22: Pt remains anuric on CRRT lactic/metabolic acidosis improving.  Beta-hydroxy remains elevated continuing insulin gtt until normalized  08/23: DKA resolved, weaned off vasopressors, BP remains soft. Renal Indices and UOP improved, plan to stop CRRT today. Echo from yesterday with HFrEF (EF 30-35%) and moderate reduction in RV function; will consult Cardiology/HF.     Assessment/Plan:   Principal Problem:   Sepsis (HCC) Active Problems:   Acute renal failure (HCC)   HFrEF (heart failure with reduced ejection fraction) (HCC)   Nutrition Problem: Inadequate oral intake Etiology: acute illness  Signs/Symptoms: meal completion < 25%   Body mass index is 34.49 kg/m.  (Morbid obesity)   AKI with metabolic acidosis: Improving.  Creatinine is down to 1.91.  Creatinine was 7.31 on admission.  S/p CCRT.  CCRT on hold for now.  Follow-up with nephrologist.   Euglycemic DKA, type II DM: Continue insulin glargine.  NovoLog as needed for hyperglycemia.  S/p treatment with IV insulin drip.  Jardiance and metformin have been held. Hemoglobin A1c was 8.2.   Hypotension: Improved.  She is off of vasopressors.   Acute metabolic encephalopathy: Improved   Shock: Resolved.  Differential diagnosis include hypovolemic versus septic versus cardiogenic shock.   Elevated troponins:  Peak troponin 495.  This is likely due to demand ischemia Chronic systolic CHF: Not on GDMT because  of AKI.  Plan for left and right heart cath when kidney function improves.  2D on 04/01/2023 echo showed EF estimated at 30 to 35%. 2D echo in June 2024 showed EF estimated at 15 to 25% at [facility]   Suspected sepsis secondary to acute UTI: No growth on urine and blood cultures.  Completed 3 days of IV cefepime.  She also had 2 days worth of IV vancomycin and IV Flagyl.  Elevated lipase and amylase: Probably due to AKI.  No acute abnormalities of pancreatitis on CT abdomen and pelvis.   Hypophosphatemia, hypokalemia and hypomagnesemia: Replete potassium and monitor levels Elevated liver enzymes: Improved   History of SVT: Self-limited.   Recent left MCA stroke s/p thrombectomy in June 2024 (at [facility] )   Dysphagia: She is on dysphagia 3 diet.   1.4 cm right lower lobe pulmonary nodule: Stable since study in 2012.  Transfer from stepdown unit to MedSurg  Diet Order             Diet regular Room service appropriate? Yes with Assist; Fluid consistency: Thin  Diet effective now                            Consultants: Nephrologist Cardiologist Intensivist  Procedures: Left femoral vein central line on 03/31/2023    Medications:    aspirin  81 mg Oral Daily   atorvastatin  40 mg Oral Daily   Chlorhexidine Gluconate Cloth  6 each Topical Daily   famotidine  20 mg Oral Daily   feeding supplement  237 mL Oral TID BM   heparin  5,000 Units Subcutaneous Q8H   insulin aspart  0-5 Units Subcutaneous QHS   insulin aspart  0-9 Units Subcutaneous TID WC   insulin glargine-yfgn  8 Units Subcutaneous Daily   metoprolol succinate  25 mg Oral Daily   multivitamin  1 tablet Oral QHS   multivitamin with minerals  1 tablet Oral Daily   mupirocin ointment  1 Application Nasal BID   Continuous Infusions:  sodium chloride     sodium chloride Stopped (04/01/23 1722)     Anti-infectives (From admission, onward)    Start     Dose/Rate Route Frequency Ordered Stop    04/01/23 1800  ceFEPIme (MAXIPIME) 1 g in sodium chloride 0.9 % 100 mL IVPB  Status:  Discontinued        1 g 200 mL/hr over 30 Minutes Intravenous Every 24 hours 03/31/23 1844 04/01/23 0327   04/01/23 1800  ceFEPIme (MAXIPIME) 2 g in sodium chloride 0.9 % 100 mL IVPB        2 g 200 mL/hr over 30 Minutes Intravenous Every 12 hours 04/01/23 0931 04/02/23 1807   04/01/23 0800  vancomycin (VANCOREADY) IVPB 750 mg/150 mL        750 mg 150 mL/hr over 60 Minutes Intravenous  Once 04/01/23 0446 04/01/23 0942   04/01/23 0700  metroNIDAZOLE (FLAGYL) IVPB 500 mg  Status:  Discontinued        500 mg 100 mL/hr over 60 Minutes Intravenous Every 12 hours 03/31/23 1901 04/01/23 1035   04/01/23 0600  ceFEPIme (MAXIPIME) 2 g in sodium chloride 0.9 % 100 mL IVPB        2 g 200 mL/hr over 30 Minutes Intravenous  Once 04/01/23 0327 04/01/23 0623   04/01/23  0600  vancomycin (VANCOREADY) IVPB 750 mg/150 mL  Status:  Discontinued        750 mg 150 mL/hr over 60 Minutes Intravenous  Once 04/01/23 0334 04/01/23 0446   03/31/23 1846  vancomycin variable dose per unstable renal function (pharmacist dosing)  Status:  Discontinued         Does not apply See admin instructions 03/31/23 1847 04/01/23 1531   03/31/23 1300  vancomycin (VANCOREADY) IVPB 1750 mg/350 mL        1,750 mg 175 mL/hr over 120 Minutes Intravenous  Once 03/31/23 1245 03/31/23 1900   03/31/23 1300  ceFEPIme (MAXIPIME) 2 g in sodium chloride 0.9 % 100 mL IVPB        2 g 200 mL/hr over 30 Minutes Intravenous  Once 03/31/23 1245 03/31/23 1842   03/31/23 1245  metroNIDAZOLE (FLAGYL) IVPB 500 mg        500 mg 100 mL/hr over 60 Minutes Intravenous  Once 03/31/23 1232 03/31/23 1758              Family Communication/Anticipated D/C date and plan/Code Status   DVT prophylaxis: heparin injection 5,000 Units Start: 03/31/23 1400 SCDs Start: 03/31/23 1311     Code Status: Full Code  Family Communication: None Disposition Plan: Plan to  discharge to SNF versus home.   Status is: Inpatient Remains inpatient appropriate because: AKI, cardiomyopathy and may need to cardiac catheterization prior to discharge       Subjective:   Interval events noted.  She has no complaints.  No shortness of breath, chest pain, vomiting or diarrhea.  Objective:    Vitals:   04/04/23 0500 04/04/23 0600 04/04/23 0700 04/04/23 1004  BP: 128/76 (!) 149/106 121/65 (!) 147/77  Pulse: 80 89 61 80  Resp: 17 18 16 18   Temp: 97.6 F (36.4 C)   97.7 F (36.5 C)  TempSrc: Oral   Oral  SpO2: 99% 96% 96% 98%  Weight: 82.8 kg     Height:       No data found.   Intake/Output Summary (Last 24 hours) at 04/04/2023 1250 Last data filed at 04/04/2023 0945 Gross per 24 hour  Intake 240 ml  Output 600 ml  Net -360 ml   Filed Weights   04/02/23 0457 04/03/23 0407 04/04/23 0500  Weight: 86.1 kg 84.7 kg 82.8 kg    Exam:  GEN: NAD SKIN: No rash EYES: No pallor or icterus ENT: MMM CV: RRR PULM: CTA B ABD: soft, obese, NT, +BS CNS: AAO x 3, non focal EXT: No edema or tenderness      Data Reviewed:   I have personally reviewed following labs and imaging studies:  Labs: Labs show the following:   Basic Metabolic Panel: Recent Labs  Lab 03/31/23 1752 03/31/23 2030 03/31/23 2308 04/01/23 0406 04/01/23 1033 04/01/23 1350 04/01/23 1913 04/02/23 0420 04/03/23 0418 04/04/23 0406  NA  --   --    < > 138   < > 132* 135 139 140 140  K  --   --    < > 3.7   < > 3.7 3.9 3.8 3.4* 3.4*  CL  --   --    < > 98   < > 100 99 102 104 106  CO2  --   --    < > 24   < > 25 23 24 26 26   GLUCOSE  --   --    < > 132*   < >  208* 151* 108* 99 106*  BUN  --   --    < > 36*   < > 29* 26* 15 17 18   CREATININE  --   --    < > 3.14*   < > 3.02* 2.91* 1.92* 1.95* 1.91*  CALCIUM  --   --    < > 8.1*   < > 7.7* 7.9* 7.9* 8.3* 8.3*  MG 1.1* 1.1*  --  2.5*  --   --   --  2.2 2.1 1.7  PHOS 7.8*  --   --  2.0*  --   --  2.8 1.9*  --  3.1   < > =  values in this interval not displayed.   GFR Estimated Creatinine Clearance: 31.3 mL/min (A) (by C-G formula based on SCr of 1.91 mg/dL (H)). Liver Function Tests: Recent Labs  Lab 03/31/23 1103 04/01/23 0406 04/01/23 1913  AST 52* 26  --   ALT 35 21  --   ALKPHOS 61 38  --   BILITOT 1.2 1.1  --   PROT 8.5* 5.8*  --   ALBUMIN 4.1 2.9* 2.8*   Recent Labs  Lab 03/31/23 1103 03/31/23 1449 04/01/23 0406 04/01/23 1033  LIPASE 240*  --  198*  --   AMYLASE  --  411*  --  517*   No results for input(s): "AMMONIA" in the last 168 hours. Coagulation profile No results for input(s): "INR", "PROTIME" in the last 168 hours.  CBC: Recent Labs  Lab 03/31/23 1005 03/31/23 1451 04/01/23 0406 04/02/23 0420 04/03/23 0418 04/04/23 0406  WBC 15.0* 17.8* 11.5* 7.1 7.5 7.5  NEUTROABS 13.5*  --   --  4.8  --   --   HGB 17.3* 14.4 12.9 11.5* 11.3* 12.8  HCT 57.9* 45.4 38.0 33.9* 33.9* 38.4  MCV 94.3 89.9 84.4 84.3 86.3 84.8  PLT 316 303 192 126* 109* 140*   Cardiac Enzymes: No results for input(s): "CKTOTAL", "CKMB", "CKMBINDEX", "TROPONINI" in the last 168 hours. BNP (last 3 results) No results for input(s): "PROBNP" in the last 8760 hours. CBG: Recent Labs  Lab 04/03/23 1140 04/03/23 1607 04/03/23 2116 04/04/23 0733 04/04/23 1147  GLUCAP 120* 114* 124* 93 130*   D-Dimer: No results for input(s): "DDIMER" in the last 72 hours. Hgb A1c: No results for input(s): "HGBA1C" in the last 72 hours.  Lipid Profile: No results for input(s): "CHOL", "HDL", "LDLCALC", "TRIG", "CHOLHDL", "LDLDIRECT" in the last 72 hours. Thyroid function studies: No results for input(s): "TSH", "T4TOTAL", "T3FREE", "THYROIDAB" in the last 72 hours.  Invalid input(s): "FREET3"  Anemia work up: No results for input(s): "VITAMINB12", "FOLATE", "FERRITIN", "TIBC", "IRON", "RETICCTPCT" in the last 72 hours. Sepsis Labs: Recent Labs  Lab 03/31/23 1449 03/31/23 1451 04/01/23 0406 04/01/23 1033  04/01/23 1350 04/01/23 1904 04/02/23 0420 04/03/23 0418 04/04/23 0406  PROCALCITON 0.30  --   --  0.44  --   --   --   --   --   WBC  --    < > 11.5*  --   --   --  7.1 7.5 7.5  LATICACIDVEN >9.0*   < > 4.0* 4.1* 2.5* 3.8* 1.6  --   --    < > = values in this interval not displayed.    Microbiology Recent Results (from the past 240 hour(s))  Resp panel by RT-PCR (RSV, Flu A&B, Covid) Anterior Nasal Swab     Status: None   Collection Time: 03/31/23  10:06 AM   Specimen: Anterior Nasal Swab  Result Value Ref Range Status   SARS Coronavirus 2 by RT PCR NEGATIVE NEGATIVE Final    Comment: (NOTE) SARS-CoV-2 target nucleic acids are NOT DETECTED.  The SARS-CoV-2 RNA is generally detectable in upper respiratory specimens during the acute phase of infection. The lowest concentration of SARS-CoV-2 viral copies this assay can detect is 138 copies/mL. A negative result does not preclude SARS-Cov-2 infection and should not be used as the sole basis for treatment or other patient management decisions. A negative result may occur with  improper specimen collection/handling, submission of specimen other than nasopharyngeal swab, presence of viral mutation(s) within the areas targeted by this assay, and inadequate number of viral copies(<138 copies/mL). A negative result must be combined with clinical observations, patient history, and epidemiological information. The expected result is Negative.  Fact Sheet for Patients:  BloggerCourse.com  Fact Sheet for Healthcare Providers:  SeriousBroker.it  This test is no t yet approved or cleared by the [place] FDA and  has been authorized for detection and/or diagnosis of SARS-CoV-2 by FDA under an Emergency Use Authorization (EUA). This EUA will remain  in effect (meaning this test can be used) for the duration of the COVID-19 declaration under Section 564(b)(1) of the Act, 21 U.S.C.section  360bbb-3(b)(1), unless the authorization is terminated  or revoked sooner.       Influenza A by PCR NEGATIVE NEGATIVE Final   Influenza B by PCR NEGATIVE NEGATIVE Final    Comment: (NOTE) The Xpert Xpress SARS-CoV-2/FLU/RSV plus assay is intended as an aid in the diagnosis of influenza from Nasopharyngeal swab specimens and should not be used as a sole basis for treatment. Nasal washings and aspirates are unacceptable for Xpert Xpress SARS-CoV-2/FLU/RSV testing.  Fact Sheet for Patients: BloggerCourse.com  Fact Sheet for Healthcare Providers: SeriousBroker.it  This test is not yet approved or cleared by the [place] FDA and has been authorized for detection and/or diagnosis of SARS-CoV-2 by FDA under an Emergency Use Authorization (EUA). This EUA will remain in effect (meaning this test can be used) for the duration of the COVID-19 declaration under Section 564(b)(1) of the Act, 21 U.S.C. section 360bbb-3(b)(1), unless the authorization is terminated or revoked.     Resp Syncytial Virus by PCR NEGATIVE NEGATIVE Final    Comment: (NOTE) Fact Sheet for Patients: BloggerCourse.com  Fact Sheet for Healthcare Providers: SeriousBroker.it  This test is not yet approved or cleared by the [place] FDA and has been authorized for detection and/or diagnosis of SARS-CoV-2 by FDA under an Emergency Use Authorization (EUA). This EUA will remain in effect (meaning this test can be used) for the duration of the COVID-19 declaration under Section 564(b)(1) of the Act, 21 U.S.C. section 360bbb-3(b)(1), unless the authorization is terminated or revoked.  Performed at [facility], [address] [place], [place] [address]   MRSA Next Gen by PCR, Nasal     Status: Abnormal   Collection Time: 03/31/23  2:23 PM   Specimen: Nasal Mucosa; Nasal Swab  Result Value Ref Range  Status   MRSA by PCR Next Gen DETECTED (A) NOT DETECTED Final    Comment: RESULT CALLED TO, READ BACK BY AND VERIFIED WITH: CHARLIE CORN 03/31/23 1546 KLW (NOTE) The GeneXpert MRSA Assay (FDA approved for NASAL specimens only), is one component of a comprehensive MRSA colonization surveillance program. It is not intended to diagnose MRSA infection nor to guide or monitor treatment for MRSA infections. Test performance  is not FDA approved in patients less than 66 years old. Performed at [facility], [address] [place], [place] [address]   Blood culture (routine x 2)     Status: None (Preliminary result)   Collection Time: 03/31/23  2:49 PM   Specimen: BLOOD  Result Value Ref Range Status   Specimen Description BLOOD LEFT ANTECUBITAL  Final   Special Requests   Final    BOTTLES DRAWN AEROBIC AND ANAEROBIC Blood Culture results may not be optimal due to an inadequate volume of blood received in culture bottles   Culture   Final    NO GROWTH 4 DAYS Performed at [facility], [address], [place], [place] [address]    Report Status PENDING  Incomplete  Blood culture (routine x 2)     Status: None (Preliminary result)   Collection Time: 03/31/23  3:01 PM   Specimen: BLOOD  Result Value Ref Range Status   Specimen Description BLOOD BLOOD LEFT HAND  Final   Special Requests   Final    BOTTLES DRAWN AEROBIC ONLY Blood Culture results may not be optimal due to an inadequate volume of blood received in culture bottles   Culture   Final    NO GROWTH 4 DAYS Performed at [facility], [address] [place], [place] [address]    Report Status PENDING  Incomplete  Urine Culture     Status: None   Collection Time: 03/31/23  5:04 PM   Specimen: Urine, Random  Result Value Ref Range Status   Specimen Description   Final    URINE, RANDOM Performed at [facility], [address] [place], [place] [address]    Special Requests   Final     NONE Performed at [facility], [address], [place], [place] [address]    Culture   Final    NO GROWTH Performed at [facility], [address], [place], [place] [address]    Report Status 04/02/2023 FINAL  Final    Procedures and diagnostic studies:  No results found.             LOS: 4 days   Shannen Vernon  [facility]   Pager on [email or web address]. If 7PM-7AM, please contact night-coverage at [email or web address]     04/04/2023, 12:50 PM

## 2023-04-04 NOTE — Progress Notes (Signed)
[facility]  ROUNDING NOTE   Subjective:   Patient seen sitting up in chair Working with speech therapy  States feel better today Room air  Creatinine 1.91 of urine output noted on dayshift yesterday  Objective:  Vital signs in last 24 hours:  Temp:  [97.3 F (36.3 C)-97.7 F (36.5 C)] 97.7 F (36.5 C) (08/25 1004) Pulse Rate:  [61-89] 80 (08/25 1004) Resp:  [14-22] 18 (08/25 1004) BP: (109-157)/(56-106) 147/77 (08/25 1004) SpO2:  [92 %-99 %] 98 % (08/25 1004) Weight:  [82.8 kg] 82.8 kg (08/25 0500)  Weight change: -1.9 kg Filed Weights   04/02/23 0457 04/03/23 0407 04/04/23 0500  Weight: 86.1 kg 84.7 kg 82.8 kg    Intake/Output: I/O last 3 completed shifts: In: 0  Out: 1850 [Urine:1850]   Intake/Output this shift:  Total I/O In: 240 [P.O.:240] Out: 600 [Urine:600]  Physical Exam: General: NAD,sitting in chair  Head: Normocephalic, atraumatic. Moist oral mucosal membranes  Eyes: Anicteric  Lungs:  Clear to auscultation  Heart: Regular rate and rhythm  Abdomen:  Soft, nontender  Extremities: No peripheral edema.  Neurologic: Nonfocal, moving all four extremities  Skin: No lesions  Access: None    Basic Metabolic Panel: Recent Labs  Lab 03/31/23 1752 03/31/23 2030 03/31/23 2308 04/01/23 0406 04/01/23 1033 04/01/23 1350 04/01/23 1913 04/02/23 0420 04/03/23 0418 04/04/23 0406  NA  --   --    < > 138   < > 132* 135 139 140 140  K  --   --    < > 3.7   < > 3.7 3.9 3.8 3.4* 3.4*  CL  --   --    < > 98   < > 100 99 102 104 106  CO2  --   --    < > 24   < > 25 23 24 26 26   GLUCOSE  --   --    < > 132*   < > 208* 151* 108* 99 106*  BUN  --   --    < > 36*   < > 29* 26* 15 17 18   CREATININE  --   --    < > 3.14*   < > 3.02* 2.91* 1.92* 1.95* 1.91*  CALCIUM  --   --    < > 8.1*   < > 7.7* 7.9* 7.9* 8.3* 8.3*  MG 1.1* 1.1*  --  2.5*  --   --   --  2.2 2.1 1.7  PHOS 7.8*  --   --  2.0*  --   --  2.8 1.9*  --  3.1   < > = values in  this interval not displayed.    Liver Function Tests: Recent Labs  Lab 03/31/23 1103 04/01/23 0406 04/01/23 1913  AST 52* 26  --   ALT 35 21  --   ALKPHOS 61 38  --   BILITOT 1.2 1.1  --   PROT 8.5* 5.8*  --   ALBUMIN 4.1 2.9* 2.8*   Recent Labs  Lab 03/31/23 1103 03/31/23 1449 04/01/23 0406 04/01/23 1033  LIPASE 240*  --  198*  --   AMYLASE  --  411*  --  517*   No results for input(s): "AMMONIA" in the last 168 hours.  CBC: Recent Labs  Lab 03/31/23 1005 03/31/23 1451 04/01/23 0406 04/02/23 0420 04/03/23 0418 04/04/23 0406  WBC 15.0* 17.8* 11.5* 7.1 7.5 7.5  NEUTROABS 13.5*  --   --  4.8  --   --   HGB 17.3* 14.4 12.9 11.5* 11.3* 12.8  HCT 57.9* 45.4 38.0 33.9* 33.9* 38.4  MCV 94.3 89.9 84.4 84.3 86.3 84.8  PLT 316 303 192 126* 109* 140*    Cardiac Enzymes: No results for input(s): "CKTOTAL", "CKMB", "CKMBINDEX", "TROPONINI" in the last 168 hours.  BNP: Invalid input(s): "POCBNP"  CBG: Recent Labs  Lab 04/03/23 0742 04/03/23 1140 04/03/23 1607 04/03/23 2116 04/04/23 0733  GLUCAP 83 120* 114* 124* 93    Microbiology: Results for orders placed or performed during the hospital encounter of 03/31/23  Resp panel by RT-PCR (RSV, Flu A&B, Covid) Anterior Nasal Swab     Status: None   Collection Time: 03/31/23 10:06 AM   Specimen: Anterior Nasal Swab  Result Value Ref Range Status   SARS Coronavirus 2 by RT PCR NEGATIVE NEGATIVE Final    Comment: (NOTE) SARS-CoV-2 target nucleic acids are NOT DETECTED.  The SARS-CoV-2 RNA is generally detectable in upper respiratory specimens during the acute phase of infection. The lowest concentration of SARS-CoV-2 viral copies this assay can detect is 138 copies/mL. A negative result does not preclude SARS-Cov-2 infection and should not be used as the sole basis for treatment or other patient management decisions. A negative result may occur with  improper specimen collection/handling, submission of specimen  other than nasopharyngeal swab, presence of viral mutation(s) within the areas targeted by this assay, and inadequate number of viral copies(<138 copies/mL). A negative result must be combined with clinical observations, patient history, and epidemiological information. The expected result is Negative.  Fact Sheet for Patients:  BloggerCourse.com  Fact Sheet for Healthcare Providers:  SeriousBroker.it  This test is no t yet approved or cleared by the [place] FDA and  has been authorized for detection and/or diagnosis of SARS-CoV-2 by FDA under an Emergency Use Authorization (EUA). This EUA will remain  in effect (meaning this test can be used) for the duration of the COVID-19 declaration under Section 564(b)(1) of the Act, 21 U.S.C.section 360bbb-3(b)(1), unless the authorization is terminated  or revoked sooner.       Influenza A by PCR NEGATIVE NEGATIVE Final   Influenza B by PCR NEGATIVE NEGATIVE Final    Comment: (NOTE) The Xpert Xpress SARS-CoV-2/FLU/RSV plus assay is intended as an aid in the diagnosis of influenza from Nasopharyngeal swab specimens and should not be used as a sole basis for treatment. Nasal washings and aspirates are unacceptable for Xpert Xpress SARS-CoV-2/FLU/RSV testing.  Fact Sheet for Patients: BloggerCourse.com  Fact Sheet for Healthcare Providers: SeriousBroker.it  This test is not yet approved or cleared by the [place] FDA and has been authorized for detection and/or diagnosis of SARS-CoV-2 by FDA under an Emergency Use Authorization (EUA). This EUA will remain in effect (meaning this test can be used) for the duration of the COVID-19 declaration under Section 564(b)(1) of the Act, 21 U.S.C. section 360bbb-3(b)(1), unless the authorization is terminated or revoked.     Resp Syncytial Virus by PCR NEGATIVE NEGATIVE Final     Comment: (NOTE) Fact Sheet for Patients: BloggerCourse.com  Fact Sheet for Healthcare Providers: SeriousBroker.it  This test is not yet approved or cleared by the [place] FDA and has been authorized for detection and/or diagnosis of SARS-CoV-2 by FDA under an Emergency Use Authorization (EUA). This EUA will remain in effect (meaning this test can be used) for the duration of the COVID-19 declaration under Section 564(b)(1) of the Act, 21 U.S.C. section 360bbb-3(b)(1), unless  the authorization is terminated or revoked.  Performed at [facility], [address] [place], [place] [address]   MRSA Next Gen by PCR, Nasal     Status: Abnormal   Collection Time: 03/31/23  2:23 PM   Specimen: Nasal Mucosa; Nasal Swab  Result Value Ref Range Status   MRSA by PCR Next Gen DETECTED (A) NOT DETECTED Final    Comment: RESULT CALLED TO, READ BACK BY AND VERIFIED WITH: CHARLIE CORN 03/31/23 1546 KLW (NOTE) The GeneXpert MRSA Assay (FDA approved for NASAL specimens only), is one component of a comprehensive MRSA colonization surveillance program. It is not intended to diagnose MRSA infection nor to guide or monitor treatment for MRSA infections. Test performance is not FDA approved in patients less than 104 years old. Performed at [facility], [address] [place], [place] [address]   Blood culture (routine x 2)     Status: None (Preliminary result)   Collection Time: 03/31/23  2:49 PM   Specimen: BLOOD  Result Value Ref Range Status   Specimen Description BLOOD LEFT ANTECUBITAL  Final   Special Requests   Final    BOTTLES DRAWN AEROBIC AND ANAEROBIC Blood Culture results may not be optimal due to an inadequate volume of blood received in culture bottles   Culture   Final    NO GROWTH 4 DAYS Performed at [facility], [address], [place], [place] [address]    Report Status PENDING  Incomplete   Blood culture (routine x 2)     Status: None (Preliminary result)   Collection Time: 03/31/23  3:01 PM   Specimen: BLOOD  Result Value Ref Range Status   Specimen Description BLOOD BLOOD LEFT HAND  Final   Special Requests   Final    BOTTLES DRAWN AEROBIC ONLY Blood Culture results may not be optimal due to an inadequate volume of blood received in culture bottles   Culture   Final    NO GROWTH 4 DAYS Performed at [facility], [address] [place], [place] [address]    Report Status PENDING  Incomplete  Urine Culture     Status: None   Collection Time: 03/31/23  5:04 PM   Specimen: Urine, Random  Result Value Ref Range Status   Specimen Description   Final    URINE, RANDOM Performed at [facility], [address], [place], [place] [address]    Special Requests   Final    NONE Performed at [facility], [address], [place], [place] [address]    Culture   Final    NO GROWTH Performed at [facility], [address] [place], [place] [address]    Report Status 04/02/2023 FINAL  Final    Coagulation Studies: No results for input(s): "LABPROT", "INR" in the last 72 hours.  Urinalysis: No results for input(s): "COLORURINE", "LABSPEC", "PHURINE", "GLUCOSEU", "HGBUR", "BILIRUBINUR", "KETONESUR", "PROTEINUR", "UROBILINOGEN", "NITRITE", "LEUKOCYTESUR" in the last 72 hours.  Invalid input(s): "APPERANCEUR"     Imaging: No results found.   Medications:    sodium chloride     sodium chloride Stopped (04/01/23 1722)    aspirin  81 mg Oral Daily   atorvastatin  40 mg Oral Daily   Chlorhexidine Gluconate Cloth  6 each Topical Daily   famotidine  20 mg Oral Daily   feeding supplement  237 mL Oral TID BM   heparin  5,000 Units Subcutaneous Q8H   insulin aspart  0-5 Units  Subcutaneous QHS   insulin aspart  0-9 Units Subcutaneous TID WC   insulin glargine-yfgn  8 Units Subcutaneous Daily   metoprolol succinate  25 mg Oral Daily    multivitamin  1 tablet Oral QHS   multivitamin with minerals  1 tablet Oral Daily   mupirocin ointment  1 Application Nasal BID   dextrose, docusate sodium, heparin sodium (porcine), ipratropium-albuterol, loperamide, ondansetron (ZOFRAN) IV, mouth rinse, polyethylene glycol  Assessment/ Plan:  [name] is a 57 y.o.  female with diabetes mellitus type II, seizure disorder, hypertension, history of ovarian cancer who is admitted to [facility] on 03/31/2023 for Sepsis (HCC) [A41.9]  Acute kidney injury with history proteinuria: Baseline creatinine was 0.76 on 02/19/2023. Serologic work up negative on 8/22. No obstruction on CT. No IV contrast exposure. Required CRRT for 1 day. Foley catheter in place - holding home metformin.   - holding empagliflozin - holding furosemide - Renal function stable with adequate urine output. - Will monitor for now  Lab Results  Component Value Date   CREATININE 1.91 (H) 04/04/2023   CREATININE 1.95 (H) 04/03/2023   CREATININE 1.92 (H) 04/02/2023    Intake/Output Summary (Last 24 hours) at 04/04/2023 1137 Last data filed at 04/04/2023 0945 Gross per 24 hour  Intake 240 ml  Output 600 ml  Net -360 ml   2. Hypokalemia:   - remains 3.4. Oral supplementation ordered by primary team  3. 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 Not well controlled, hemoglobin A1c of 8.2%.   - holding metformin and empagliflozin  - glucose well controlled   -primary team to manage sliding scale insulin  4. Hypertension, essential: now with hypotension on midodrine. Required vasopressors this admission. Home regimen of metoprolol and furosemide.   Now receiving Metoprolol only.   LOS: 4 Sarah Lane 8/25/202411:37 AM

## 2023-04-04 NOTE — Plan of Care (Signed)
  Problem: Education: Goal: Knowledge of General Education information will improve Description: Including pain rating scale, medication(s)/side effects and non-pharmacologic comfort measures Outcome: Progressing   Problem: Clinical Measurements: Goal: Ability to maintain clinical measurements within normal limits will improve Outcome: Progressing Goal: Will remain free from infection Outcome: Progressing Goal: Respiratory complications will improve Outcome: Progressing Goal: Cardiovascular complication will be avoided Outcome: Progressing   Problem: Activity: Goal: Risk for activity intolerance will decrease Outcome: Progressing   Problem: Nutrition: Goal: Adequate nutrition will be maintained Outcome: Progressing   Problem: Coping: Goal: Level of anxiety will decrease Outcome: Progressing   Problem: Elimination: Goal: Will not experience complications related to bowel motility Outcome: Progressing Goal: Will not experience complications related to urinary retention Outcome: Progressing   Problem: Pain Managment: Goal: General experience of comfort will improve Outcome: Progressing   Problem: Safety: Goal: Ability to remain free from injury will improve Outcome: Progressing

## 2023-04-04 NOTE — Progress Notes (Signed)
SLP Cancellation Note  Patient Details Name: Sarah Lane MRN: [id number] DOB: 03-25-1966   Cancelled treatment:       Reason Eval/Treat Not Completed: SLP screened, no needs identified, will sign off (Per nursing, no concerns regarding swallow function. Pt seen earlier this admission for swallowing. Noted pt with changes to speech/language/cognition in setting of recent stroke. Will plan for speech/language evaluation.)  Sarah Lane, M.S., CCC-SLP Speech-Language Pathologist [facility] [phone] Sarah Lane)  Sarah Lane 04/04/2023, 11:03 AM

## 2023-04-04 NOTE — Evaluation (Signed)
Physical Therapy Evaluation Patient Details Name: Sarah Lane MRN: [id number] DOB: [birth year] Today's Date: 04/04/2023  History of Present Illness  Pt is a 57 year old female presenting to ED, admitted with euglycemic DKA suspect secondary to SGLT 2 requiring insulin gtt, acute metabolic encephalopathy, sepsis secondary to possible acute pancreatitis and/or UTI, severe lactic/metabolic acidosis, and acute kidney injury    PMH significant for type II diabetes mellitus, seizures, HTN, dysrhythmia, and ovarian cancer, recent stroke in June 2024 in L MCA territory  Clinical Impression   Pt is received in bed, she is agreeable to PT session. No reports of pain throughout session. MD clearance for amb and sitting in recliner ahead of session. Skin assessment performed on L fem access prior to mobility for safety and wound was covered with no bleeding outside bandage. Pt performs bed mobility supervision, transfers min A, and amb CGA for safety. Pt able to amb approx 30 ft using RW and frequent cuing for safe sequencing. Pt in recliner at end of session with no reports of pain. Re-assessed L fem access site following amb and site looked unchanged. Pt cont to have difficulty with word finding and benefits from yes/no responses. Pt would benefit from skilled PT to address above deficits and promote optimal return to PLOF.       If plan is discharge home, recommend the following: A little help with walking and/or transfers;A little help with bathing/dressing/bathroom;Assist for transportation;Help with stairs or ramp for entrance;Supervision due to cognitive status   Can travel by private vehicle        Equipment Recommendations None recommended by PT  Recommendations for Other Services       Functional Status Assessment Patient has had a recent decline in their functional status and demonstrates the ability to make significant improvements in function in a reasonable and predictable amount of time.      Precautions / Restrictions Precautions Precautions: Fall Restrictions Weight Bearing Restrictions: No      Mobility  Bed Mobility Overal bed mobility: Needs Assistance Bed Mobility: Supine to Sit     Supine to sit: HOB elevated, Supervision     General bed mobility comments: intermittent cuing for hand placement    Transfers Overall transfer level: Needs assistance Equipment used: Rolling walker (2 wheels) Transfers: Sit to/from Stand Sit to Stand: Min assist           General transfer comment: intermittent cuing for safety/technique    Ambulation/Gait Ambulation/Gait assistance: Contact guard assist Gait Distance (Feet): 30 Feet Assistive device: Rolling walker (2 wheels) Gait Pattern/deviations: Step-through pattern, Decreased step length - right, Decreased step length - left, Decreased stride length Gait velocity: Decreased     General Gait Details: frequent cuing for sequencing and maintain RW close to trunk  Stairs            Wheelchair Mobility     Tilt Bed    Modified Rankin (Stroke Patients Only)       Balance Overall balance assessment: Needs assistance Sitting-balance support: Feet supported Sitting balance-Leahy Scale: Good Sitting balance - Comments: Able to maintain seated EOB balance during functional activities   Standing balance support: Bilateral upper extremity supported, During functional activity, Reliant on assistive device for balance Standing balance-Leahy Scale: Good Standing balance comment: Able to maintain static standing balance during functional activities                             Pertinent  Vitals/Pain Pain Assessment Pain Assessment: No/denies pain    Home Living Family/patient expects to be discharged to:: Private residence Living Arrangements: Non-relatives/Friends (Lives with 2 friends although 1 is on vacation) Available Help at Discharge: Friend(s) Type of Home: Mobile home Home  Access: Ramped entrance       Home Layout: One level Home Equipment: Rollator (4 wheels);BSC/3in1 Additional Comments: uses rollator for amb    Prior Function Prior Level of Function : Needs assist       Physical Assist : ADLs (physical);Mobility (physical) Mobility (physical): Gait ADLs (physical): IADLs Mobility Comments: amb with rollator househols distances that pt says she privately acquired ADLs Comments: pt reports she generally completes ADLs with MOD I however per chart ?medication complance     Extremity/Trunk Assessment   Upper Extremity Assessment Upper Extremity Assessment: Defer to OT evaluation    Lower Extremity Assessment Lower Extremity Assessment: Generalized weakness       Communication   Communication Communication: Difficulty following commands/understanding;Difficulty communicating thoughts/reduced clarity of speech Following commands: Follows one step commands with increased time Cueing Techniques: Verbal cues;Tactile cues;Visual cues  Cognition Arousal: Alert Behavior During Therapy: Impulsive Overall Cognitive Status: No family/caregiver present to determine baseline cognitive functioning Area of Impairment: Attention, Memory, Following commands, Safety/judgement, Awareness, Problem solving                   Current Attention Level: Sustained   Following Commands: Follows one step commands with increased time Safety/Judgement: Decreased awareness of deficits Awareness: Emergent Problem Solving: Slow processing, Requires verbal cues, Requires tactile cues General Comments: frequent cuing for AD use during amb        General Comments General comments (skin integrity, edema, etc.): Assessed L fem access at beginning and end of session    Exercises     Assessment/Plan    PT Assessment Patient needs continued PT services  PT Problem List Decreased strength;Decreased mobility;Decreased activity tolerance;Decreased balance;Pain        PT Treatment Interventions DME instruction;Gait training;Functional mobility training;Therapeutic activities;Therapeutic exercise;Balance training    PT Goals (Current goals can be found in the Care Plan section)  Acute Rehab PT Goals Patient Stated Goal: to get better PT Goal Formulation: With patient Time For Goal Achievement: 04/18/23 Potential to Achieve Goals: Good    Frequency Min 1X/week     Co-evaluation               AM-PAC PT "6 Clicks" Mobility  Outcome Measure Help needed turning from your back to your side while in a flat bed without using bedrails?: None Help needed moving from lying on your back to sitting on the side of a flat bed without using bedrails?: None Help needed moving to and from a bed to a chair (including a wheelchair)?: None Help needed standing up from a chair using your arms (e.g., wheelchair or bedside chair)?: A Little Help needed to walk in hospital room?: A Little Help needed climbing 3-5 steps with a railing? : A Lot 6 Click Score: 20    End of Session Equipment Utilized During Treatment: Gait belt Activity Tolerance: Patient tolerated treatment well Patient left: in chair;with call bell/phone within reach;with chair alarm set Nurse Communication: Mobility status PT Visit Diagnosis: Unsteadiness on feet (R26.81);History of falling (Z91.81);Muscle weakness (generalized) (M62.81);Pain Pain - Right/Left: Left Pain - part of body: Leg    Time: 1030-1049 PT Time Calculation (min) (ACUTE ONLY): 19 min   Charges:  Elmon Else, SPT   Anatalia Kronk 04/04/2023, 11:12 AM

## 2023-04-04 NOTE — Progress Notes (Signed)
PHARMACY CONSULT NOTE  Pharmacy Consult for Electrolyte Monitoring and Replacement   Recent Labs: Potassium (mmol/L)  Date Value  04/04/2023 3.4 (L)   Magnesium (mg/dL)  Date Value  65/78/4696 1.7   Calcium (mg/dL)  Date Value  29/52/8413 8.3 (L)   Albumin (g/dL)  Date Value  24/40/1027 2.8 (L)   Phosphorus (mg/dL)  Date Value  25/36/6440 3.1   Sodium (mmol/L)  Date Value  04/04/2023 140   Corrected Ca: 8.98 mg/dL  Assessment: Sarah Lane is a 57 y.o. female with PMH including DM, seizures, HTN, dysrhythmia, ovarian cancer admitted on 03/31/2023 with  euglycemic DKA, sepsis secondary to acute pancreatitis, severe lactic acidosis, AKI, now off CRRT. Pharmacy consulted to assist with electrolyte monitoring and replacement as indicated.  Goal of Therapy:  Electrolytes within normal limits  Plan:  --2 grams IV magnesium sulfate x 1 --20 mEq po KCl x 1 --Pharmacy will continue to follow along  Lowella Bandy, PharmD Clinical Pharmacist 04/04/2023 7:11 AM

## 2023-04-04 NOTE — Progress Notes (Signed)
Rounding Note    Patient Name: Sarah Lane Date of Encounter: 04/04/2023  [facility] Cardiologist: New  Subjective   Breathing OK  No CP  Sitting up in bed about to walk with PT  Inpatient Medications    Scheduled Meds:  aspirin  81 mg Oral Daily   atorvastatin  40 mg Oral Daily   Chlorhexidine Gluconate Cloth  6 each Topical Daily   famotidine  20 mg Oral Daily   feeding supplement  237 mL Oral TID BM   heparin  5,000 Units Subcutaneous Q8H   insulin aspart  0-5 Units Subcutaneous QHS   insulin aspart  0-9 Units Subcutaneous TID WC   insulin glargine-yfgn  8 Units Subcutaneous Daily   multivitamin  1 tablet Oral QHS   multivitamin with minerals  1 tablet Oral Daily   mupirocin ointment  1 Application Nasal BID   Continuous Infusions:  sodium chloride     sodium chloride Stopped (04/01/23 1722)   magnesium sulfate bolus IVPB     PRN Meds: dextrose, docusate sodium, heparin sodium (porcine), ipratropium-albuterol, loperamide, ondansetron (ZOFRAN) IV, mouth rinse, polyethylene glycol   Vital Signs    Vitals:   04/04/23 0400 04/04/23 0500 04/04/23 0600 04/04/23 0700  BP: (!) 116/56 128/76 (!) 149/106 121/65  Pulse: 79 80 89 61  Resp: 17 17 18 16   Temp:  97.6 F (36.4 C)    TempSrc:  Oral    SpO2: 98% 99% 96% 96%  Weight:  82.8 kg    Height:       No intake or output data in the 24 hours ending 04/04/23 0935   Net 4 L positive       04/04/2023    5:00 AM 04/03/2023    4:07 AM 04/02/2023    4:57 AM  Last 3 Weights  Weight (lbs) 182 lb 8.7 oz 186 lb 11.7 oz 189 lb 13.1 oz  Weight (kg) 82.8 kg 84.7 kg 86.1 kg      Telemetry    No tele  - Personally Reviewed  ECG    NO new  - Personally Reviewed  Physical Exam   GEN: No acute distress.   Neck: JVP is not elevated  Cardiac: RRR, no murmur, Respiratory: Clear to auscultation GI: Soft, nontender, non-distended   No hepatomegaly  MS: No edema;  Neuro:  Deferred     Labs    High  Sensitivity Troponin:   Recent Labs  Lab 03/31/23 1449 03/31/23 1751 03/31/23 2030 03/31/23 2308  TROPONINIHS 213* 295* 495* 475*     Chemistry Recent Labs  Lab 03/31/23 1103 03/31/23 1449 04/01/23 0406 04/01/23 1033 04/01/23 1913 04/02/23 0420 04/03/23 0418 04/04/23 0406  NA 136   < > 138   < > 135 139 140 140  K 4.8   < > 3.7   < > 3.9 3.8 3.4* 3.4*  CL 88*   < > 98   < > 99 102 104 106  CO2 <7*   < > 24   < > 23 24 26 26   GLUCOSE 152*   < > 132*   < > 151* 108* 99 106*  BUN 73*   < > 36*   < > 26* 15 17 18   CREATININE 7.31*   < > 3.14*   < > 2.91* 1.92* 1.95* 1.91*  CALCIUM 10.0   < > 8.1*   < > 7.9* 7.9* 8.3* 8.3*  MG  --    < >  2.5*  --   --  2.2 2.1 1.7  PROT 8.5*  --  5.8*  --   --   --   --   --   ALBUMIN 4.1  --  2.9*  --  2.8*  --   --   --   AST 52*  --  26  --   --   --   --   --   ALT 35  --  21  --   --   --   --   --   ALKPHOS 61  --  38  --   --   --   --   --   BILITOT 1.2  --  1.1  --   --   --   --   --   GFRNONAA 6*   < > 17*   < > 18* 30* 29* 30*  ANIONGAP NOT CALCULATED   < > 16*   < > 13 13 10 8    < > = values in this interval not displayed.    Lipids No results for input(s): "CHOL", "TRIG", "HDL", "LABVLDL", "LDLCALC", "CHOLHDL" in the last 168 hours.  Hematology Recent Labs  Lab 04/02/23 0420 04/03/23 0418 04/04/23 0406  WBC 7.1 7.5 7.5  RBC 4.02 3.93 4.53  HGB 11.5* 11.3* 12.8  HCT 33.9* 33.9* 38.4  MCV 84.3 86.3 84.8  MCH 28.6 28.8 28.3  MCHC 33.9 33.3 33.3  RDW 13.5 13.4 13.2  PLT 126* 109* 140*   Thyroid  Recent Labs  Lab 03/31/23 1449  TSH 0.924    BNPNo results for input(s): "BNP", "PROBNP" in the last 168 hours.  DDimer No results for input(s): "DDIMER" in the last 168 hours.   Radiology    No results found.  Cardiac Studies   Echo  Apr 01, 2023  1. Left ventricular ejection fraction, by estimation, is 30 to 35%. Left  ventricular ejection fraction by 2D MOD biplane is 38.4 %. Left  ventricular ejection  fraction by PLAX is 29 %. The left ventricle has  moderately decreased function. The left  ventricle demonstrates global hypokinesis. Left ventricular diastolic  parameters are consistent with Grade I diastolic dysfunction (impaired  relaxation).   2. Right ventricular systolic function is moderately reduced. The right  ventricular size is moderately enlarged. There is mildly elevated  pulmonary artery systolic pressure. The estimated right ventricular  systolic pressure is 42.3 mmHg.   3. The mitral valve is normal in structure. Mild mitral valve  regurgitation. No evidence of mitral stenosis.   4. Tricuspid valve regurgitation is mild to moderate.   5. The aortic valve is tricuspid. Aortic valve regurgitation is not  visualized. No aortic stenosis is present.   6. The inferior vena cava is normal in size with <50% respiratory  variability, suggesting right atrial pressure of 8 mmHg.   Patient Profile    Sarah Lane is a 57 y.o. female with a history of recent stroke, poorly controlled DM, recently dx cardiomyopathy & HFrEF (EF 15-20% by [facility] echo 01/2023), HTN, PSVT, remote seizures, and RLL pulm nodule, who is being seen today for the evaluation of cardiomyopathy at the request of Dr. Belia Heman.   Assessment & Plan    1  HFrEF   New Dx  in June 2024 while at [facility]  LVEF 15 to 25%    Repeat echo on 04/01/23 LVEF 30 to 35%    Not on GDMT due to hypotension and  renal dysfunction  No Jardiance due to acidosis Volume status looks good  Will add low dose Toprol XL and follow    No ARB given renal dysfunction    No jardiance given acidosis she developed before   Etiology of LV dysfunction not determined  WOuld plan on R?L heart cath  But only when pt's renal function normalizes    2  Blood pressure  Hx of HTN  but pt hypotensive on admit  Midodrine started on admission Tapered  to off    Add low dose Toprol XL   Follow   3  Hx of SVT   Self limited  Rates in 160 range    No recurrence  REsume  tele.   4  Hx  REcent  L MCA  June 2024   s/p thrombectomy [facility])  4  DM  PT found to be hypoglycemic on arrival   Lactic acid greater than 9  PH 6.99 PCO2 23     Pt iniitiated on CRRT   Diabetic held esp jardiance   IM following   6  Renal   Cr on 8/21  7.31    Now 1.91  Was less than 1 in July when admitted at [facility] following   For questions or updates, please contact [facility] Please consult [email or web address] for contact info under        Signed, Dietrich Pates, MD  04/04/2023, 9:35 AM

## 2023-04-04 NOTE — Progress Notes (Signed)
Inpatient Rehab Admissions Coordinator Note:   Per therapy patient was screened for CIR candidacy by Emmanuell Kantz Luvenia Starch, CCC-SLP. At this time, pt appears to be a potential candidate for CIR. I will place an order for rehab consult for full assessment, per our protocol.  Please contact me any with questions.Wolfgang Phoenix, MS, CCC-SLP Admissions Coordinator [phone] 04/04/23 2:17 PM

## 2023-04-05 ENCOUNTER — Other Ambulatory Visit (HOSPITAL_COMMUNITY): Payer: Self-pay

## 2023-04-05 DIAGNOSIS — I5022 Chronic systolic (congestive) heart failure: Secondary | ICD-10-CM | POA: Insufficient documentation

## 2023-04-05 DIAGNOSIS — G9340 Encephalopathy, unspecified: Secondary | ICD-10-CM | POA: Diagnosis not present

## 2023-04-05 DIAGNOSIS — E8729 Other acidosis: Principal | ICD-10-CM

## 2023-04-05 DIAGNOSIS — I5020 Unspecified systolic (congestive) heart failure: Secondary | ICD-10-CM

## 2023-04-05 DIAGNOSIS — N179 Acute kidney failure, unspecified: Secondary | ICD-10-CM | POA: Diagnosis not present

## 2023-04-05 LAB — BASIC METABOLIC PANEL
Anion gap: 11 (ref 5–15)
BUN: 16 mg/dL (ref 6–20)
CO2: 25 mmol/L (ref 22–32)
Calcium: 8.6 mg/dL — ABNORMAL LOW (ref 8.9–10.3)
Chloride: 105 mmol/L (ref 98–111)
Creatinine, Ser: 1.72 mg/dL — ABNORMAL HIGH (ref 0.44–1.00)
GFR, Estimated: 34 mL/min — ABNORMAL LOW (ref >60)
Glucose, Bld: 93 mg/dL (ref 70–99)
Potassium: 3.4 mmol/L — ABNORMAL LOW (ref 3.5–5.1)
Sodium: 141 mmol/L (ref 135–145)

## 2023-04-05 LAB — CBC
HCT: 37.9 % (ref 36.0–46.0)
Hemoglobin: 13 g/dL (ref 12.0–15.0)
MCH: 28.8 pg (ref 26.0–34.0)
MCHC: 34.3 g/dL (ref 30.0–36.0)
MCV: 83.8 fL (ref 80.0–100.0)
Platelets: 169 10*3/uL (ref 150–400)
RBC: 4.52 MIL/uL (ref 3.87–5.11)
RDW: 13.2 % (ref 11.5–15.5)
WBC: 6.5 10*3/uL (ref 4.0–10.5)
nRBC: 0 % (ref 0.0–0.2)

## 2023-04-05 LAB — CULTURE, BLOOD (ROUTINE X 2)
Culture: NO GROWTH
Culture: NO GROWTH

## 2023-04-05 LAB — GLUCOSE, CAPILLARY
Glucose-Capillary: 85 mg/dL (ref 70–99)
Glucose-Capillary: 144 mg/dL — ABNORMAL HIGH (ref 70–99)
Glucose-Capillary: 174 mg/dL — ABNORMAL HIGH (ref 70–99)
Glucose-Capillary: 113 mg/dL — ABNORMAL HIGH (ref 70–99)

## 2023-04-05 LAB — MAGNESIUM: Magnesium: 1.7 mg/dL (ref 1.7–2.4)

## 2023-04-05 NOTE — Progress Notes (Addendum)
Physical Therapy Treatment Patient Details Name: Sarah Lane MRN: [id number] DOB: Jan 06, 1966 Today's Date: 04/05/2023   History of Present Illness Pt is a 57 year old female presenting to ED, admitted with euglycemic DKA suspect secondary to SGLT 2 requiring insulin gtt, acute metabolic encephalopathy, sepsis secondary to possible acute pancreatitis and/or UTI, severe lactic/metabolic acidosis, and acute kidney injury    PMH significant for type II diabetes mellitus, seizures, HTN, dysrhythmia, and ovarian cancer, recent stroke in June 2024 in L MCA territory    PT Comments  Pt presents sitting in recliner, no complaints of pain. Orthostatics assessed revealing sitting 126/83, initial standing 108/83, and 3 min standing 115/81 (pt asymptomatic during testing). Pt performed sit<>stand and ambulated ~327ft with CGA and RW/No AD.  DGI assessment revealed impairments in  level surface gait (decreased gait speed) and stepping around obstacles (slowed down to change direction). PT noted overall unsteadiness during ambulation without AD use(~231ft) but no LOB noted. Pt would benefit from continued skilled therapy to maximize functional independence. PT assessment/plan reviewed and updated as appropriate.     If plan is discharge home, recommend the following: A little help with walking and/or transfers;A little help with bathing/dressing/bathroom;Assist for transportation;Help with stairs or ramp for entrance;Supervision due to cognitive status   Can travel by private vehicle        Equipment Recommendations  None recommended by PT    Recommendations for Other Services       Precautions / Restrictions Precautions Precautions: Fall Restrictions Weight Bearing Restrictions: No     Mobility  Bed Mobility               General bed mobility comments: Not observed during session    Transfers Overall transfer level: Needs assistance Equipment used: Rolling walker (2 wheels) Transfers:  Sit to/from Stand Sit to Stand: Contact guard assist                Ambulation/Gait Ambulation/Gait assistance: Contact guard assist Gait Distance (Feet): 300 Feet Assistive device: Rolling walker (2 wheels), None   Gait velocity: Decreased     General Gait Details: B/L trunk lean during ambulation without AD   Stairs             Wheelchair Mobility     Tilt Bed    Modified Rankin (Stroke Patients Only)       Balance Overall balance assessment: Needs assistance Sitting-balance support: Feet supported Sitting balance-Leahy Scale: Good     Standing balance support: Bilateral upper extremity supported, During functional activity, Reliant on assistive device for balance Standing balance-Leahy Scale: Good                   Standardized Balance Assessment Standardized Balance Assessment : Dynamic Gait Index   Dynamic Gait Index Level Surface: Mild Impairment Change in Gait Speed: Normal Gait with Horizontal Head Turns: Normal Gait with Vertical Head Turns: Normal Gait and Pivot Turn: Normal Step Over Obstacle: Normal Step Around Obstacles: Mild Impairment Steps: Mild Impairment Total Score: 21      Cognition Arousal: Alert Behavior During Therapy: WFL for tasks assessed/performed Overall Cognitive Status: Within Functional Limits for tasks assessed                                          Exercises      General Comments        Pertinent  Vitals/Pain Pain Assessment Pain Assessment: No/denies pain    Home Living                          Prior Function            PT Goals (current goals can now be found in the care plan section) Acute Rehab PT Goals Patient Stated Goal: to get better Progress towards PT goals: Progressing toward goals    Frequency    Min 1X/week      PT Plan      Co-evaluation              AM-PAC PT "6 Clicks" Mobility   Outcome Measure  Help needed turning from  your back to your side while in a flat bed without using bedrails?: None Help needed moving from lying on your back to sitting on the side of a flat bed without using bedrails?: None Help needed moving to and from a bed to a chair (including a wheelchair)?: None Help needed standing up from a chair using your arms (e.g., wheelchair or bedside chair)?: A Little Help needed to walk in hospital room?: A Little Help needed climbing 3-5 steps with a railing? : A Little 6 Click Score: 21    End of Session Equipment Utilized During Treatment: Gait belt Activity Tolerance: Patient tolerated treatment well Patient left: in chair;with call bell/phone within reach;with chair alarm set Nurse Communication: Other (comment) (RN notified/aware chair alarm is on and pt requesting sponge bath) PT Visit Diagnosis: Unsteadiness on feet (R26.81);History of falling (Z91.81);Muscle weakness (generalized) (M62.81);Pain Pain - Right/Left: Left Pain - part of body: Leg     Time: 1315-1336 PT Time Calculation (min) (ACUTE ONLY): 21 min  Charges:                           Blenda Nicely, PT, SPT 3:29 PM,04/05/23

## 2023-04-05 NOTE — Progress Notes (Signed)
[facility]  - [place] [facility]   PATIENT NAME: Sarah Lane    MR#:  [id number]  DATE OF BIRTH:  July 22, 1966  SUBJECTIVE:  patient sitting in the recliner chair. Denies any complaints. Overall improving. Work with PT earlier.    VITALS:  Blood pressure (!) 158/88, pulse 85, temperature (!) 97.4 F (36.3 C), temperature source Oral, resp. rate 18, height 5\' 1"  (1.549 m), weight 73.6 kg, SpO2 100%.  PHYSICAL EXAMINATION:   GENERAL:  57 y.o.-year-old patient with no acute distress.  LUNGS: Normal breath sounds bilaterally, no wheezing CARDIOVASCULAR: S1, S2 normal. No murmur   ABDOMEN: Soft, nontender, nondistended.  EXTREMITIES: trace edema b/l.    NEUROLOGIC: nonfocal  patient is alert and awake, has chronic dysarthria. SKIN: No obvious rash, lesion, or ulcer.   LABORATORY PANEL:  CBC Recent Labs  Lab 04/05/23 0512  WBC 6.5  HGB 13.0  HCT 37.9  PLT 169    Chemistries  Recent Labs  Lab 04/01/23 0406 04/01/23 1033 04/05/23 0512  NA 138   < > 141  K 3.7   < > 3.4*  CL 98   < > 105  CO2 24   < > 25  GLUCOSE 132*   < > 93  BUN 36*   < > 16  CREATININE 3.14*   < > 1.72*  CALCIUM 8.1*   < > 8.6*  MG 2.5*   < > 1.7  AST 26  --   --   ALT 21  --   --   ALKPHOS 38  --   --   BILITOT 1.1  --   --    < > = values in this interval not displayed.   Assessment and Plan  Sarah Lane is a 57 y.o. female  with  PMH of type II diabetes mellitus, seizures, HTN, dysrhythmia, and ovarian cancer.  She presented to [facility] ER on 08/21 via EMS with c/o nausea/vomiting/diarrhea/abdominal pain with inability to tolerate po's.  Onset of symptoms the night of 08/20.  Pt reports her CBG's have been running "High"although she has been taking and tolerating her metformin, jardiance, and insulin.  Upon EMS arrival at pts home she was hypoglycemic with CBG's in the 30's. She received 250 ml bolus of D10W with CBG increasing to 86.  She reports she is living with friends and  has had contact with someone recently diagnosed wit   CT Abd Pelvis without oral or iv contrast: Suboptimal exam. No acute inflammatory process identified within the abdomen or pelvis. No discrete metastatic disease identified. Multiple other nonacute observations, as described above. Aortic Atherosclerosis (ICD10-I70.0).   CT Head WO Contrast: No acute intracranial abnormality. 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 Mild microvascular ischemic changes.     Significant Hospital Events: pt was in the ICU earleir   08/21: Pt admitted with euglycemic DKA suspect secondary to SGLT 2 requiring insulin gtt, acute metabolic encephalopathy, sepsis secondary to possible acute pancreatitis and/or UTI, severe lactic/metabolic acidosis, and acute kidney injury.  Nephrology consulted and CRRT initiated  08/22: Pt remains anuric on CRRT lactic/metabolic acidosis improving.  Beta-hydroxy remains elevated continuing insulin gtt until normalized  08/23: DKA resolved, weaned off vasopressors, BP remains soft. Renal Indices and UOP improved, plan to stop CRRT today. Echo from yesterday with HFrEF (EF 30-35%) and moderate reduction in RV function; will consult Cardiology/HF. 8/24-25--Dr Myriam Forehand 8/26-- patient sitting out in the recliner. Denies any other complaints. Tolerating  PO diet. She is going to get cardiac catheterization per cardiology evaluation. Did very well with physical therapy recommends home health PT.  Acute renal failure with metabolic acidosis in the setting of DKA suspect due to SGLT2 -- patient now off insulin drip. -- Labs improved. -- Patient did require CR RT temporary. -- Making good urine. -- Followed by nephrology  Heart failure acute systolic demand ischemia -- followed by [facility] cardiology -- Echo shows EF of 30 to 35% this admission. Previous echo in June 2024 showed EF of 15 to 20% at [facility] -- cardiology recommends right and left heart  catheterization -- continue beta-blockers -- follow cardiology recommendation for GDMT as labs improve --AVOID SGLT2  Type II diabetes -- continue current insulin regimen adjust according to sugars  History of CVA -- status post thrombectomy at [facility] in June 2024 -- continue aspirin on Lipitor  Hypophosphatemia, hypokalemia and hypomagnesemia: -- Replete potassium and monitor levels  Elevated liver enzymes: Improved  Dysphagia: She is on dysphagia 3 diet.    1.4 cm right lower lobe pulmonary nodule: Stable since study in 2012.      Procedures: Family communication :none Consults : [facility] cardiology, nephrology CODE STATUS: full DVT Prophylaxis : heparin Level of care: Med-Surg Status is: Inpatient Remains inpatient appropriate because: awaiting left heart catheterization. TOC for discharge planning    TOTAL TIME TAKING CARE OF THIS PATIENT: 35 minutes.  >50% time spent on counselling and coordination of care  Note: This dictation was prepared with Dragon dictation along with smaller phrase technology. Any transcriptional errors that result from this process are unintentional.  Enedina Finner M.D    [facility]   CC: Primary care physician; Care, [facility]

## 2023-04-05 NOTE — Progress Notes (Signed)
Progress Note  Patient Name: Sarah Lane Date of Encounter: 04/05/2023  Primary Cardiologist: New - consult by Arida  Subjective   No chest pain, dyspnea, palpitations, dizziness, presyncope, or syncope.   Inpatient Medications    Scheduled Meds:  aspirin  81 mg Oral Daily   atorvastatin  40 mg Oral Daily   Chlorhexidine Gluconate Cloth  6 each Topical Daily   famotidine  20 mg Oral Daily   feeding supplement  237 mL Oral TID BM   heparin  5,000 Units Subcutaneous Q8H   insulin aspart  0-5 Units Subcutaneous QHS   insulin aspart  0-9 Units Subcutaneous TID WC   insulin glargine-yfgn  8 Units Subcutaneous Daily   metoprolol succinate  25 mg Oral Daily   multivitamin  1 tablet Oral QHS   multivitamin with minerals  1 tablet Oral Daily   mupirocin ointment  1 Application Nasal BID   Continuous Infusions:  sodium chloride     sodium chloride Stopped (04/01/23 1722)   PRN Meds: dextrose, docusate sodium, heparin sodium (porcine), ipratropium-albuterol, loperamide, ondansetron (ZOFRAN) IV, mouth rinse, polyethylene glycol   Vital Signs    Vitals:   04/04/23 1952 04/05/23 0351 04/05/23 0639 04/05/23 0822  BP: 129/66 123/66  (!) 158/88  Pulse: 73 71  85  Resp: 18 20  18   Temp: 98 F (36.7 C) 97.7 F (36.5 C)  (!) 97.4 F (36.3 C)  TempSrc: Oral Oral  Oral  SpO2: 99% 99%  100%  Weight:   73.6 kg   Height:        Intake/Output Summary (Last 24 hours) at 04/05/2023 1038 Last data filed at 04/04/2023 1927 Gross per 24 hour  Intake 240 ml  Output --  Net 240 ml   Filed Weights   04/03/23 0407 04/04/23 0500 04/05/23 0639  Weight: 84.7 kg 82.8 kg 73.6 kg    Telemetry    Not on tele - Personally Reviewed  ECG    No new tracings - Personally Reviewed  Physical Exam   GEN: No acute distress.   Neck: No JVD. Cardiac: RRR, no murmurs, rubs, or gallops.  Respiratory: Clear to auscultation bilaterally.  GI: Soft, nontender, non-distended.   MS: No edema; No  deformity. Neuro:  Alert and oriented x 3; Nonfocal.  Psych: Normal affect.  Labs    Chemistry Recent Labs  Lab 03/31/23 1103 03/31/23 1449 04/01/23 0406 04/01/23 1033 04/01/23 1913 04/02/23 0420 04/03/23 0418 04/04/23 0406 04/05/23 0512  NA 136   < > 138   < > 135   < > 140 140 141  K 4.8   < > 3.7   < > 3.9   < > 3.4* 3.4* 3.4*  CL 88*   < > 98   < > 99   < > 104 106 105  CO2 <7*   < > 24   < > 23   < > 26 26 25   GLUCOSE 152*   < > 132*   < > 151*   < > 99 106* 93  BUN 73*   < > 36*   < > 26*   < > 17 18 16   CREATININE 7.31*   < > 3.14*   < > 2.91*   < > 1.95* 1.91* 1.72*  CALCIUM 10.0   < > 8.1*   < > 7.9*   < > 8.3* 8.3* 8.6*  PROT 8.5*  --  5.8*  --   --   --   --   --   --  ALBUMIN 4.1  --  2.9*  --  2.8*  --   --   --   --   AST 52*  --  26  --   --   --   --   --   --   ALT 35  --  21  --   --   --   --   --   --   ALKPHOS 61  --  38  --   --   --   --   --   --   BILITOT 1.2  --  1.1  --   --   --   --   --   --   GFRNONAA 6*   < > 17*   < > 18*   < > 29* 30* 34*  ANIONGAP NOT CALCULATED   < > 16*   < > 13   < > 10 8 11    < > = values in this interval not displayed.     Hematology Recent Labs  Lab 04/03/23 0418 04/04/23 0406 04/05/23 0512  WBC 7.5 7.5 6.5  RBC 3.93 4.53 4.52  HGB 11.3* 12.8 13.0  HCT 33.9* 38.4 37.9  MCV 86.3 84.8 83.8  MCH 28.8 28.3 28.8  MCHC 33.3 33.3 34.3  RDW 13.4 13.2 13.2  PLT 109* 140* 169    Cardiac EnzymesNo results for input(s): "TROPONINI" in the last 168 hours. No results for input(s): "TROPIPOC" in the last 168 hours.   BNPNo results for input(s): "BNP", "PROBNP" in the last 168 hours.   DDimer No results for input(s): "DDIMER" in the last 168 hours.   Radiology    No results found.  Cardiac Studies   2D echo 04/01/2023: 1. Left ventricular ejection fraction, by estimation, is 30 to 35%. Left  ventricular ejection fraction by 2D MOD biplane is 38.4 %. Left  ventricular ejection fraction by PLAX is 29 %. The  left ventricle has  moderately decreased function. The left  ventricle demonstrates global hypokinesis. Left ventricular diastolic  parameters are consistent with Grade I diastolic dysfunction (impaired  relaxation).   2. Right ventricular systolic function is moderately reduced. The right  ventricular size is moderately enlarged. There is mildly elevated  pulmonary artery systolic pressure. The estimated right ventricular  systolic pressure is 42.3 mmHg.   3. The mitral valve is normal in structure. Mild mitral valve  regurgitation. No evidence of mitral stenosis.   4. Tricuspid valve regurgitation is mild to moderate.   5. The aortic valve is tricuspid. Aortic valve regurgitation is not  visualized. No aortic stenosis is present.   6. The inferior vena cava is normal in size with <50% respiratory  variability, suggesting right atrial pressure of 8 mmHg.  __________  2D echo 01/27/2023 [facility]): Summary   1. Left ventricular dilatation with severely reduced global systolic  function (EF 15-20%) and impaired relaxation with elevated filling pressure.    2. The right ventricle is mildly to moderately dilated in size, with mildly  reduced systolic function, associated with evidence of moderate pulmonary  hypertension.   3. There is no evidence of an interatrial flow communication or  intrapulmonary shunt by agitated saline study.    4. IVC size and inspiratory change suggest mildly elevated right atrial  pressure. (5-10 mmHg).    5. Biatrial enlargement.   Patient Profile     57 y.o. female with history of recent stroke, poorly controlled DM, recently diagnosed cardiomyopathy and  HFrEF (EF 15-20% by [facility] echo 01/2023), HTN, PSVT, remote seizures, and RLL pulmonary nodule, who is being seen today for the evaluation of cardiomyopathy at the request of Dr. Belia Heman.   Assessment & Plan    1. HFrEF: -Uncertain etiology -Diagnosed at [facility] by echo in 01/2023 with an EF of 15-20% at that time with  LVSF this admission of 30-35% -She was placed on Lopressor 12.5 bid, Jardiance 10 daily, and Lasix 40 bid at that time with recommendation for outpatient cardiology follow up and eventual cath -Avoid future SGLT2i use given #4 -Multiple risk factors for CAD and known cerebrovascular disease along w/ aortic atherosclerosis  -Will need R/LHC as renal function normalizes, possibly 8/27 -Continue Toprol XL -Not currently on ACEi/ARB/ARNi/MRA in setting of acute renal failure on admission   2. Demand ischemia: -No symptoms of angina or cardiac decmpensation -Mildly elevated and flat trending, peaking at 495 -Will plan for [facility] as renal function normalizes this admission, possibly tomorrow, will discuss with MD  3. AKI: -Improving -In the setting of #4 -Nephrology following  4. Euglycemic DKA/DM/metabolic and lactic acidosis: -Per IM -No longer on SGLT2i, avoid use  5. Hypotension: -Improved, no longer requiring midodrine  -Now back on Toprol   6. History of CVA: -Status post thrombectomy at [facility] in 01/2023 -ASA and Lipitor   7. PSVT: -Self limited  -On admission, she was in SVT @ 160 and subsequently converted to sinus tachycardia  -No further episodes -Now on Toprol -Resume cardiac telemetry      For questions or updates, please contact [facility] Please consult [email or web address] for contact info under Cardiology/STEMI.    Signed, Eula Listen, PA-C [facility] Pager: [phone] 04/05/2023, 10:38 AM

## 2023-04-05 NOTE — Progress Notes (Signed)
[facility] HF Stewardship  PCP: [facility]  PCP-Cardiologist: None  HPI: Sarah Lane is a 57 y.o. female with type II diabetes mellitus, seizures, HTN, dysrhythmia, cerebrovascular disease and ovarian cancer  who presented with nausea, vomiting, diarrhea, abdominal pain, and inability to tolerate oral intake. Found to have euglycemic DKA thought due to SGLT2i and acute infection. Diagnosed with sepsis and admitted to the ICU  requiring vasopressors and CRRT. DKA resolved, patient was weaned off vasopressors, and transferred to the floor. Cardiology planning [facility] when creatinine is stable.  Pertinent cardiac history: Diagnosed with systolic heart failure in 01/2023 at [facility] during admission for CVA s/p thrombectomy where LVEF was noted to be 15-20% on TTE. TTE repeated this admission on 04/01/23 showed LVEF of 30-35% with grade 1 diastolic dysfunction and moderately reduced RV.   Pertinent Lab Values: Creatinine, Ser  Date Value Ref Range Status  04/05/2023 1.72 (H) 0.44 - 1.00 mg/dL Final   BUN  Date Value Ref Range Status  04/05/2023 16 6 - 20 mg/dL Final   Potassium  Date Value Ref Range Status  04/05/2023 3.4 (L) 3.5 - 5.1 mmol/L Final   Sodium  Date Value Ref Range Status  04/05/2023 141 135 - 145 mmol/L Final   Magnesium  Date Value Ref Range Status  04/05/2023 1.7 1.7 - 2.4 mg/dL Final    Comment:    Performed at [facility], [address] [place], [place] [address]   Hgb A1c MFr Bld  Date Value Ref Range Status  03/31/2023 8.2 (H) 4.8 - 5.6 % Final    Comment:    (NOTE) Pre diabetes:          5.7%-6.4%  Diabetes:              >6.4%  Glycemic control for   <7.0% adults with diabetes    TSH  Date Value Ref Range Status  03/31/2023 0.924 0.350 - 4.500 uIU/mL Final    Comment:    Performed by a 3rd Generation assay with a functional sensitivity of <=0.01 uIU/mL. Performed at [facility], [address] [place], [place] [address]      Vital Signs: Temp:  [97.4 F (36.3 C)-98 F (36.7 C)] 97.4 F (36.3 C) (08/26 0822) Pulse Rate:  [71-85] 85 (08/26 0822) Resp:  [18-20] 18 (08/26 0822) BP: (123-158)/(66-88) 158/88 (08/26 0822) SpO2:  [98 %-100 %] 100 % (08/26 0822) Weight:  [73.6 kg (162 lb 4.8 oz)] 73.6 kg (162 lb 4.8 oz) (08/26 2956)   Intake/Output Summary (Last 24 hours) at 04/05/2023 1103 Last data filed at 04/05/2023 1038 Gross per 24 hour  Intake 480 ml  Output --  Net 480 ml    Current Inpatient Medications:    Beta Blocker: Metoprolol succinate 25 mg daily           Prior to admission HF Medications:  Loop Diuretic: Furosemide 40 mg BID Beta Blocker: Metoprolol tartrate 12.5 mg BID Sodium-glucose Cotransporter-2 Inhibitors (SGLT2i): Jardiance 10 mg daily  Assessment: 1. 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 NYHA class I-II symptoms.  -Serum creatinine continues to improve, however charted urine output remains low.  -SBP generally at goal, but elevated to 150s this morning. Previously treated with midodrine. May benefit from gentle afterload reduction with hydralazine if BP remains elevated this afternoon. No ACE/ARB/ARNI until creatinine is stable after cath. -Not a candidate for SGLT2i given history of euglycemic DKA  Plan: 1) Medication changes recommended  at this time: None.   2) Patient assistance: Medication Assistance 1: Copay checks Copay checks for: BiDil $40  3) Education: -- Patient has been educated on current HF medications and potential additions to HF medication regimen - Patient verbalizes understanding that over the next few months, these medication doses may change and more medications may be added to optimize HF regimen - Patient has been educated on basic disease state pathophysiology and goals of therapy  Medication Assistance / Insurance Benefits Check:  Does the patient have prescription insurance?  [employer]  commercial  Type of insurance plan:   Does the patient qualify for medication assistance through manufacturers or grants? No   Outpatient Pharmacy:  Prior to admission outpatient pharmacy[facility]     Thank you for involving pharmacy in this patient's care.  Enos Fling, PharmD, BCPS Phone - [phone] Clinical Pharmacist 04/05/2023 11:09 AM

## 2023-04-05 NOTE — H&P (View-Only) (Signed)
Progress Note  Patient Name: Sarah Lane Date of Encounter: 04/05/2023  Primary Cardiologist: New - consult by Arida  Subjective   No chest pain, dyspnea, palpitations, dizziness, presyncope, or syncope.   Inpatient Medications    Scheduled Meds:  aspirin  81 mg Oral Daily   atorvastatin  40 mg Oral Daily   Chlorhexidine Gluconate Cloth  6 each Topical Daily   famotidine  20 mg Oral Daily   feeding supplement  237 mL Oral TID BM   heparin  5,000 Units Subcutaneous Q8H   insulin aspart  0-5 Units Subcutaneous QHS   insulin aspart  0-9 Units Subcutaneous TID WC   insulin glargine-yfgn  8 Units Subcutaneous Daily   metoprolol succinate  25 mg Oral Daily   multivitamin  1 tablet Oral QHS   multivitamin with minerals  1 tablet Oral Daily   mupirocin ointment  1 Application Nasal BID   Continuous Infusions:  sodium chloride     sodium chloride Stopped (04/01/23 1722)   PRN Meds: dextrose, docusate sodium, heparin sodium (porcine), ipratropium-albuterol, loperamide, ondansetron (ZOFRAN) IV, mouth rinse, polyethylene glycol   Vital Signs    Vitals:   04/04/23 1952 04/05/23 0351 04/05/23 0639 04/05/23 0822  BP: 129/66 123/66  (!) 158/88  Pulse: 73 71  85  Resp: 18 20  18   Temp: 98 F (36.7 C) 97.7 F (36.5 C)  (!) 97.4 F (36.3 C)  TempSrc: Oral Oral  Oral  SpO2: 99% 99%  100%  Weight:   73.6 kg   Height:        Intake/Output Summary (Last 24 hours) at 04/05/2023 1038 Last data filed at 04/04/2023 1927 Gross per 24 hour  Intake 240 ml  Output --  Net 240 ml   Filed Weights   04/03/23 0407 04/04/23 0500 04/05/23 0639  Weight: 84.7 kg 82.8 kg 73.6 kg    Telemetry    Not on tele - Personally Reviewed  ECG    No new tracings - Personally Reviewed  Physical Exam   GEN: No acute distress.   Neck: No JVD. Cardiac: RRR, no murmurs, rubs, or gallops.  Respiratory: Clear to auscultation bilaterally.  GI: Soft, nontender, non-distended.   MS: No edema; No  deformity. Neuro:  Alert and oriented x 3; Nonfocal.  Psych: Normal affect.  Labs    Chemistry Recent Labs  Lab 03/31/23 1103 03/31/23 1449 04/01/23 0406 04/01/23 1033 04/01/23 1913 04/02/23 0420 04/03/23 0418 04/04/23 0406 04/05/23 0512  NA 136   < > 138   < > 135   < > 140 140 141  K 4.8   < > 3.7   < > 3.9   < > 3.4* 3.4* 3.4*  CL 88*   < > 98   < > 99   < > 104 106 105  CO2 <7*   < > 24   < > 23   < > 26 26 25   GLUCOSE 152*   < > 132*   < > 151*   < > 99 106* 93  BUN 73*   < > 36*   < > 26*   < > 17 18 16   CREATININE 7.31*   < > 3.14*   < > 2.91*   < > 1.95* 1.91* 1.72*  CALCIUM 10.0   < > 8.1*   < > 7.9*   < > 8.3* 8.3* 8.6*  PROT 8.5*  --  5.8*  --   --   --   --   --   --  ALBUMIN 4.1  --  2.9*  --  2.8*  --   --   --   --   AST 52*  --  26  --   --   --   --   --   --   ALT 35  --  21  --   --   --   --   --   --   ALKPHOS 61  --  38  --   --   --   --   --   --   BILITOT 1.2  --  1.1  --   --   --   --   --   --   GFRNONAA 6*   < > 17*   < > 18*   < > 29* 30* 34*  ANIONGAP NOT CALCULATED   < > 16*   < > 13   < > 10 8 11    < > = values in this interval not displayed.     Hematology Recent Labs  Lab 04/03/23 0418 04/04/23 0406 04/05/23 0512  WBC 7.5 7.5 6.5  RBC 3.93 4.53 4.52  HGB 11.3* 12.8 13.0  HCT 33.9* 38.4 37.9  MCV 86.3 84.8 83.8  MCH 28.8 28.3 28.8  MCHC 33.3 33.3 34.3  RDW 13.4 13.2 13.2  PLT 109* 140* 169    Cardiac EnzymesNo results for input(s): "TROPONINI" in the last 168 hours. No results for input(s): "TROPIPOC" in the last 168 hours.   BNPNo results for input(s): "BNP", "PROBNP" in the last 168 hours.   DDimer No results for input(s): "DDIMER" in the last 168 hours.   Radiology    No results found.  Cardiac Studies   2D echo 04/01/2023: 1. Left ventricular ejection fraction, by estimation, is 30 to 35%. Left  ventricular ejection fraction by 2D MOD biplane is 38.4 %. Left  ventricular ejection fraction by PLAX is 29 %. The  left ventricle has  moderately decreased function. The left  ventricle demonstrates global hypokinesis. Left ventricular diastolic  parameters are consistent with Grade I diastolic dysfunction (impaired  relaxation).   2. Right ventricular systolic function is moderately reduced. The right  ventricular size is moderately enlarged. There is mildly elevated  pulmonary artery systolic pressure. The estimated right ventricular  systolic pressure is 42.3 mmHg.   3. The mitral valve is normal in structure. Mild mitral valve  regurgitation. No evidence of mitral stenosis.   4. Tricuspid valve regurgitation is mild to moderate.   5. The aortic valve is tricuspid. Aortic valve regurgitation is not  visualized. No aortic stenosis is present.   6. The inferior vena cava is normal in size with <50% respiratory  variability, suggesting right atrial pressure of 8 mmHg.  __________  2D echo 01/27/2023 [facility]): Summary   1. Left ventricular dilatation with severely reduced global systolic  function (EF 15-20%) and impaired relaxation with elevated filling pressure.    2. The right ventricle is mildly to moderately dilated in size, with mildly  reduced systolic function, associated with evidence of moderate pulmonary  hypertension.   3. There is no evidence of an interatrial flow communication or  intrapulmonary shunt by agitated saline study.    4. IVC size and inspiratory change suggest mildly elevated right atrial  pressure. (5-10 mmHg).    5. Biatrial enlargement.   Patient Profile     57 y.o. female with history of recent stroke, poorly controlled DM, recently diagnosed cardiomyopathy and  HFrEF (EF 15-20% by [facility] echo 01/2023), HTN, PSVT, remote seizures, and RLL pulmonary nodule, who is being seen today for the evaluation of cardiomyopathy at the request of Dr. Belia Heman.   Assessment & Plan    1. HFrEF: -Uncertain etiology -Diagnosed at [facility] by echo in 01/2023 with an EF of 15-20% at that time with  LVSF this admission of 30-35% -She was placed on Lopressor 12.5 bid, Jardiance 10 daily, and Lasix 40 bid at that time with recommendation for outpatient cardiology follow up and eventual cath -Avoid future SGLT2i use given #4 -Multiple risk factors for CAD and known cerebrovascular disease along w/ aortic atherosclerosis  -Will need R/LHC as renal function normalizes, possibly 8/27 -Continue Toprol XL -Not currently on ACEi/ARB/ARNi/MRA in setting of acute renal failure on admission   2. Demand ischemia: -No symptoms of angina or cardiac decmpensation -Mildly elevated and flat trending, peaking at 495 -Will plan for [facility] as renal function normalizes this admission, possibly tomorrow, will discuss with MD  3. AKI: -Improving -In the setting of #4 -Nephrology following  4. Euglycemic DKA/DM/metabolic and lactic acidosis: -Per IM -No longer on SGLT2i, avoid use  5. Hypotension: -Improved, no longer requiring midodrine  -Now back on Toprol   6. History of CVA: -Status post thrombectomy at [facility] in 01/2023 -ASA and Lipitor   7. PSVT: -Self limited  -On admission, she was in SVT @ 160 and subsequently converted to sinus tachycardia  -No further episodes -Now on Toprol -Resume cardiac telemetry      For questions or updates, please contact [facility] Please consult [email or web address] for contact info under Cardiology/STEMI.    Signed, Eula Listen, PA-C [facility] Pager: [phone] 04/05/2023, 10:38 AM

## 2023-04-05 NOTE — TOC Benefit Eligibility Note (Addendum)
Patient Product/process development scientist completed.    The patient is insured through [employer]. Patient has [employer], may use a copay card, and/or apply for patient assistance if available.    Ran test claim for isosorbide-hydralazine (Bidil) 20-37.5 mg and the current 30 day co-pay is $40.00.  Ran test claim for Entresto 24-26 mg and the current 30 day co-pay is $54.37.  This test claim was processed through [facility]- copay amounts may vary at other pharmacies due to pharmacy/plan contracts, or as the patient moves through the different stages of their insurance plan.     Sarah Lane, CPHT Pharmacy Technician III Certified Patient Advocate [facility] Patient Advocate Team Direct Number: [phone]  Fax: [phone]

## 2023-04-05 NOTE — Progress Notes (Signed)
Speech Language Pathology Treatment: Cognitive-Linquistic  Patient Details Name: Sarah Lane MRN: [id number] DOB: February 26, 1966 Today's Date: 04/05/2023 Time: 4098-1191 SLP Time Calculation (min) (ACUTE ONLY): 30 min  Assessment / Plan / Recommendation Clinical Impression  Pt seen for follow up cognitive linguistic intervention targeting pt education, further assessment, and multi modal communication strategies. Informal assessment of reading comprehension and graphic expression demonstrating accuracy for comprehension for text thread and graphic expression (texting) with use of auto populated targets, with further assessment warranted for higher level stimuli. Oral reading noted for increased challenge, with verbal cues provided for retrial and slowing rate. Verbal expression c/b non fluent utterances, with apraxic errors and irregular articulation rate. Verbal prompts and rationale for cues provided for pausing for breath and slowing rate of speech to aid motor speech production. In the setting of anomic hesitation and frustration, verbal cues and demonstration provided for use of gesture, description, and graphic expression to compensate. Moderate cues provided to apply to example. Pt endorsed understanding across education and strategies introduced- recommend follow up therapy for continued SLP services.     HPI HPI: PT is a 57 yo female who She presented to [facility] ER on 08/21 via EMS with c/o nausea/vomiting/diarrhea/abdominal pain with inability to tolerate po's. Onset of symptoms the night of 08/20. Pt with a PMH of type II diabetes mellitus, seizures, stroke, HTN, dysrhythmia, and ovarian cancer. Head CT on admission, "1. No acute intracranial abnormality.  2. 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  3. Mild microvascular ischemic changes." CXR on admission, "1. Right lower lobe pulmonary nodule, seen on prior CT chest.  2. No consolidation." Abdominal CT on  admission, "1. Suboptimal exam.  2. No acute inflammatory process identified within the abdomen or  pelvis. No discrete metastatic disease identified.  3. Multiple other nonacute observations, as described above."      SLP Plan  Continue with current plan of care      Recommendations for follow up therapy are one component of a multi-disciplinary discharge planning process, led by the attending physician.  Recommendations may be updated based on patient status, additional functional criteria and insurance authorization.    Recommendations                     Oral care BID   Frequent or constant Supervision/Assistance Aphasia (R47.01);Dysarthria and anarthria (R47.1);Apraxia (R48.2);Cognitive communication deficit (R41.841)     Continue with current plan of care   [place] Stuti Sandin Clapp  MS [facility] SLP   [place] J Clapp  04/05/2023, 10:36 AM

## 2023-04-05 NOTE — Progress Notes (Signed)
Occupational Therapy Treatment Patient Details Name: Sarah Lane MRN: [id number] DOB: 06/04/1966 Today's Date: 04/05/2023   History of present illness Pt is a 57 year old female presenting to ED, admitted with euglycemic DKA suspect secondary to SGLT 2 requiring insulin gtt, acute metabolic encephalopathy, sepsis secondary to possible acute pancreatitis and/or UTI, severe lactic/metabolic acidosis, and acute kidney injury    PMH significant for type II diabetes mellitus, seizures, HTN, dysrhythmia, and ovarian cancer, recent stroke in June 2024 in [place]   OT comments  Pt received seated in recliner (chair alarm not left engaged by nursing staff). Appearing alert; willing to work with OT on grooming. Pt demonstrating decreased safety awareness; standing and walking to sink while OT out of room, no AD use. See flowsheet below for further details of session. Left seated in recliner, chair alarm on, with all needs in reach. Pt would benefit from further therapy for increasing activity tolerance, increasing safety awareness, and fall prevention strategies during BADLs.       If plan is discharge home, recommend the following:  A little help with walking and/or transfers;A little help with bathing/dressing/bathroom;Direct supervision/assist for financial management;Help with stairs or ramp for entrance;Assist for transportation;Direct supervision/assist for medications management;Assistance with cooking/housework;Supervision due to cognitive status   Equipment Recommendations  BSC/3in1;Other (comment) (RW)    Recommendations for Other Services Rehab consult    Precautions / Restrictions Precautions Precautions: Fall Restrictions Weight Bearing Restrictions: No       Mobility Bed Mobility               General bed mobility comments: Not tested; pt received and left in chair    Transfers Overall transfer level: Needs assistance Equipment used: Rolling walker (2  wheels) Transfers: Sit to/from Stand Sit to Stand: Supervision           General transfer comment: Pt's first transfer unobserved, as she got up without assistance (and nursing had not left chair alarm engaged when OT arrived for session); required cues for safety in stand to sit t/f at chair (keep RW close, wait until felt back of chair on legs, reach back for arm rests).     Balance Overall balance assessment: Needs assistance Sitting-balance support: Feet supported Sitting balance-Leahy Scale: Good     Standing balance support: Bilateral upper extremity supported, During functional activity, Reliant on assistive device for balance Standing balance-Leahy Scale: Good Standing balance comment: able to maintain standing with and without walker; when pt walking towards walker from sink, she was reaching out for counter/bedrail to assist with balance.                           ADL either performed or assessed with clinical judgement   ADL Overall ADL's : Needs assistance/impaired     Grooming: Oral care;Brushing hair;Supervision/safety;Set up;Standing Grooming Details (indicate cue type and reason): standing at sink for grooming with SBA/supervision. Decreased FMC control noted; OT assisted to open toothpaste container plastic. Able to tolerate standing x5 minutes for grooming.                             Functional mobility during ADLs: Contact guard assist;Rolling walker (2 wheels) (Pt walked to sink without supervision while OT out of room, no AD; decreased safety awareness. Fall risk.)      Extremity/Trunk Assessment Upper Extremity Assessment Upper Extremity Assessment: Right hand dominant;RUE deficits/detail RUE Deficits /  Details: AROM; shoulder flexion to approx 3/4 full AROM, elbow,wrist appear WFL; mildly weak grip strength with imparied FMC/dexterity noted.            Vision       Perception     Praxis      Cognition Arousal:  Alert Behavior During Therapy: Impulsive Overall Cognitive Status: No family/caregiver present to determine baseline cognitive functioning                                 General Comments: Pt requires cues for safety; pt walked to the sink without OT supervision while OT was out of the room getting supplies for session. Pt continues having difficulty with expressive communication/word finding intermittently throughout session.        Exercises      Shoulder Instructions       General Comments Pt on room air. Pt walked with RW approx 40 feet in room/hall; OT used gait belt for safety, but no overt loss of balance. About 1 minute into ambulation pt reported feeling dizzy. Pt appearing distractible, looking to the sides during ambulation and having to correct path. No BP machine readily available; pt reporting dizziness improving once she returned to sitting position and rested approx 30 seconds.    Pertinent Vitals/ Pain       Pain Assessment Pain Assessment: No/denies pain  Home Living                                          Prior Functioning/Environment              Frequency  Min 1X/week        Progress Toward Goals  OT Goals(current goals can now be found in the care plan section)  Progress towards OT goals: Progressing toward goals  Acute Rehab OT Goals Patient Stated Goal: Get better OT Goal Formulation: With patient Time For Goal Achievement: 04/17/23 Potential to Achieve Goals: Good ADL Goals Pt Will Perform Grooming: with modified independence Pt Will Perform Lower Body Dressing: with modified independence Pt Will Transfer to Toilet: with modified independence;ambulating Pt Will Perform Toileting - Clothing Manipulation and hygiene: with modified independence;sit to/from stand Pt/caregiver will Perform Home Exercise Program: Right Upper extremity;With written HEP provided  Plan      Co-evaluation                  AM-PAC OT "6 Clicks" Daily Activity     Outcome Measure   Help from another person eating meals?: None Help from another person taking care of personal grooming?: None Help from another person toileting, which includes using toliet, bedpan, or urinal?: A Little Help from another person bathing (including washing, rinsing, drying)?: A Little Help from another person to put on and taking off regular upper body clothing?: None Help from another person to put on and taking off regular lower body clothing?: A Little 6 Click Score: 21    End of Session Equipment Utilized During Treatment: Gait belt;Rolling walker (2 wheels)  OT Visit Diagnosis: Other abnormalities of gait and mobility (R26.89);Hemiplegia and hemiparesis Hemiplegia - Right/Left: Right Hemiplegia - dominant/non-dominant: Dominant Hemiplegia - caused by: Cerebral infarction Curly Rim 2024)   Activity Tolerance Patient tolerated treatment well   Patient Left in chair;with call bell/phone within reach;with chair alarm set  Nurse Communication Mobility status (OT reported status to PT as well)        Time: 1052-1109 OT Time Calculation (min): 17 min  Charges: OT General Charges $OT Visit: 1 Visit OT Treatments $Self Care/Home Management : 8-22 mins  Linward Foster, MS, OTR/L  Alvester Morin 04/05/2023, 11:54 AM

## 2023-04-05 NOTE — TOC Progression Note (Addendum)
Transition of Care [facility]) - Progression Note    Patient Details  Name: Sarah Lane MRN: [id number] Date of Birth: [birth year]  Transition of Care [facility]) CM/SW Contact  Margarito Liner, LCSW Phone Number: 04/05/2023, 2:52 PM  Clinical Narrative:  Patient with improvement today. PT is recommending home health. Per chart review, her PCP office has been working on setting her up with [facility]. Liaison is checking to see if they can accept her. CSW met with patient to provide update. Cardiology entered the room so will come back later for follow up.    3:46 pm: [facility] is able to accept referral for PT and OT. Patient is aware and agreeable. CSW confirmed phone number [phone]) and address ([address], [address], [place], [place] [address]) and gave to [facility] liaison. Patient is unsure who will pick her up at discharge. She called her friend [name] but it went to voicemail. CSW encouraged her to get plan in place prior to discharge.    Barriers to Discharge: Continued Medical Work up  Expected Discharge Plan and Services     Post Acute Care Choice: Home Health, Skilled Nursing Facility Living arrangements for the past 2 months: Skilled Nursing Facility                                       Social Determinants of Health (SDOH) Interventions SDOH Screenings   Food Insecurity: No Food Insecurity (03/11/2023)   Received from [facility]  Transportation Needs: No Transportation Needs (03/11/2023)   [facility]  Financial Resource Strain: Low Risk  (07/15/2020)   Received from [facility]  Physical Activity: Insufficiently Active (07/15/2020)   Received from [facility]  Social Connections: Socially Isolated (07/15/2020)   [facility] from [facility]  Stress: No Stress Concern Present (07/15/2020)   Received from [facility]  Tobacco Use: Unknown (03/31/2023)  Health Literacy: Low Risk  (07/15/2020)   Received from [facility]    Readmission  Risk Interventions     No data to display

## 2023-04-05 NOTE — Progress Notes (Addendum)
Inpatient Rehab Admissions Coordinator:   I spoke with Pt. Regarding potential CIR admit. Pt. States she prefers home with home health and that her friend [name] lives with her and con provide 24/7 support. CIR will sign off.    Megan Salon, MS, CCC-SLP Rehab Admissions Coordinator  [phone] (celll) [phone] (office)

## 2023-04-06 ENCOUNTER — Encounter
Admission: EM | Disposition: A | Discharge status: Home Health | Payer: Self-pay | Source: Home / Self Care | Attending: Internal Medicine

## 2023-04-06 DIAGNOSIS — I2510 Atherosclerotic heart disease of native coronary artery without angina pectoris: Secondary | ICD-10-CM

## 2023-04-06 DIAGNOSIS — I2489 Other forms of acute ischemic heart disease: Secondary | ICD-10-CM

## 2023-04-06 DIAGNOSIS — I5022 Chronic systolic (congestive) heart failure: Secondary | ICD-10-CM | POA: Diagnosis not present

## 2023-04-06 HISTORY — PX: RIGHT/LEFT HEART CATH AND CORONARY ANGIOGRAPHY: CATH118266

## 2023-04-06 LAB — BASIC METABOLIC PANEL
Anion gap: 7 (ref 5–15)
BUN: 15 mg/dL (ref 6–20)
CO2: 27 mmol/L (ref 22–32)
Calcium: 8.8 mg/dL — ABNORMAL LOW (ref 8.9–10.3)
Chloride: 106 mmol/L (ref 98–111)
Creatinine, Ser: 1.45 mg/dL — ABNORMAL HIGH (ref 0.44–1.00)
GFR, Estimated: 42 mL/min — ABNORMAL LOW (ref >60)
Glucose, Bld: 90 mg/dL (ref 70–99)
Potassium: 3.6 mmol/L (ref 3.5–5.1)
Sodium: 140 mmol/L (ref 135–145)

## 2023-04-06 LAB — GLUCOSE, CAPILLARY
Glucose-Capillary: 104 mg/dL — ABNORMAL HIGH (ref 70–99)
Glucose-Capillary: 125 mg/dL — ABNORMAL HIGH (ref 70–99)
Glucose-Capillary: 76 mg/dL (ref 70–99)
Glucose-Capillary: 168 mg/dL — ABNORMAL HIGH (ref 70–99)
Glucose-Capillary: 122 mg/dL — ABNORMAL HIGH (ref 70–99)

## 2023-04-06 LAB — MULTIPLE MYELOMA PANEL, SERUM
Albumin SerPl Elph-Mcnc: 2.9 g/dL (ref 2.9–4.4)
Albumin/Glob SerPl: 1.1 (ref 0.7–1.7)
Alpha 1: 0.2 g/dL (ref 0.0–0.4)
Alpha2 Glob SerPl Elph-Mcnc: 0.6 g/dL (ref 0.4–1.0)
B-Globulin SerPl Elph-Mcnc: 0.9 g/dL (ref 0.7–1.3)
Gamma Glob SerPl Elph-Mcnc: 0.9 g/dL (ref 0.4–1.8)
Globulin, Total: 2.7 g/dL (ref 2.2–3.9)
IgA: 342 mg/dL (ref 87–352)
IgG (Immunoglobin G), Serum: 930 mg/dL (ref 586–1602)
IgM (Immunoglobulin M), Srm: 62 mg/dL (ref 26–217)
Total Protein ELP: 5.6 g/dL — ABNORMAL LOW (ref 6.0–8.5)

## 2023-04-06 LAB — CBC
HCT: 38.6 % (ref 36.0–46.0)
Hemoglobin: 13.3 g/dL (ref 12.0–15.0)
MCH: 28.5 pg (ref 26.0–34.0)
MCHC: 34.5 g/dL (ref 30.0–36.0)
MCV: 82.8 fL (ref 80.0–100.0)
Platelets: 213 10*3/uL (ref 150–400)
RBC: 4.66 MIL/uL (ref 3.87–5.11)
RDW: 13.4 % (ref 11.5–15.5)
WBC: 7.6 10*3/uL (ref 4.0–10.5)
nRBC: 0 % (ref 0.0–0.2)

## 2023-04-06 LAB — POCT I-STAT 7, (LYTES, BLD GAS, ICA,H+H)
Acid-Base Excess: 1 mmol/L (ref 0.0–2.0)
Bicarbonate: 25.2 mmol/L (ref 20.0–28.0)
Calcium, Ion: 1.22 mmol/L (ref 1.15–1.40)
HCT: 37 % (ref 36.0–46.0)
Hemoglobin: 12.6 g/dL (ref 12.0–15.0)
O2 Saturation: 96 %
Potassium: 3.5 mmol/L (ref 3.5–5.1)
Sodium: 141 mmol/L (ref 135–145)
TCO2: 26 mmol/L (ref 22–32)
pCO2 arterial: 36.2 mmHg (ref 32–48)
pH, Arterial: 7.451 — ABNORMAL HIGH (ref 7.35–7.45)
pO2, Arterial: 75 mmHg — ABNORMAL LOW (ref 83–108)

## 2023-04-06 LAB — MAGNESIUM: Magnesium: 1.5 mg/dL — ABNORMAL LOW (ref 1.7–2.4)

## 2023-04-06 SURGERY — RIGHT/LEFT HEART CATH AND CORONARY ANGIOGRAPHY
Anesthesia: Moderate Sedation

## 2023-04-06 MED ORDER — MAGNESIUM SULFATE 2 GM/50ML IV SOLN
2.0000 g | Freq: Two times a day (BID) | INTRAVENOUS | Status: AC
  Administered 2023-04-06: 2 g via INTRAVENOUS
  Filled 2023-04-06: qty 50

## 2023-04-06 MED ORDER — SODIUM CHLORIDE 0.9 % IV SOLN: INTRAVENOUS | Status: DC

## 2023-04-06 MED ORDER — SODIUM CHLORIDE 0.9 % IV SOLN: 250.0000 mL | INTRAVENOUS | Status: DC | PRN

## 2023-04-06 MED ORDER — SODIUM CHLORIDE 0.9% FLUSH
3.0000 mL | Freq: Two times a day (BID) | INTRAVENOUS | Status: DC
  Administered 2023-04-06 – 2023-04-07 (×2): 3 mL via INTRAVENOUS

## 2023-04-06 MED ORDER — SODIUM CHLORIDE 0.9% FLUSH: 3.0000 mL | INTRAVENOUS | Status: DC | PRN

## 2023-04-06 MED ORDER — FENTANYL CITRATE (PF) 100 MCG/2ML IJ SOLN
INTRAMUSCULAR | Status: AC
  Filled 2023-04-06: qty 2

## 2023-04-06 MED ORDER — LIDOCAINE HCL (PF) 1 % IJ SOLN
INTRAMUSCULAR | Status: DC | PRN
  Administered 2023-04-06 (×2): 2 mL

## 2023-04-06 MED ORDER — ADULT MULTIVITAMIN W/MINERALS CH
1.0000 | ORAL_TABLET | Freq: Every day | ORAL | Status: DC
  Administered 2023-04-07: 1 via ORAL
  Filled 2023-04-06 (×2): qty 1

## 2023-04-06 MED ORDER — HEPARIN SODIUM (PORCINE) 1000 UNIT/ML IJ SOLN
INTRAMUSCULAR | Status: AC
  Filled 2023-04-06: qty 10

## 2023-04-06 MED ORDER — VERAPAMIL HCL 2.5 MG/ML IV SOLN
INTRAVENOUS | Status: DC | PRN
  Administered 2023-04-06 (×2): 2.5 mg via INTRA_ARTERIAL

## 2023-04-06 MED ORDER — HEPARIN (PORCINE) IN NACL 1000-0.9 UT/500ML-% IV SOLN
INTRAVENOUS | Status: AC
  Filled 2023-04-06: qty 1000

## 2023-04-06 MED ORDER — HEPARIN (PORCINE) IN NACL 2000-0.9 UNIT/L-% IV SOLN
INTRAVENOUS | Status: DC | PRN
  Administered 2023-04-06: 1000 mL

## 2023-04-06 MED ORDER — LABETALOL HCL 5 MG/ML IV SOLN: 10.0000 mg | INTRAVENOUS | Status: AC | PRN

## 2023-04-06 MED ORDER — MIDAZOLAM HCL 2 MG/2ML IJ SOLN
INTRAMUSCULAR | Status: DC | PRN
  Administered 2023-04-06: 1 mg via INTRAVENOUS

## 2023-04-06 MED ORDER — MIDAZOLAM HCL 2 MG/2ML IJ SOLN
INTRAMUSCULAR | Status: AC
  Filled 2023-04-06: qty 2

## 2023-04-06 MED ORDER — METOPROLOL SUCCINATE ER 25 MG PO TB24
25.0000 mg | ORAL_TABLET | Freq: Once | ORAL | Status: AC
  Administered 2023-04-06: 25 mg via ORAL
  Filled 2023-04-06: qty 1

## 2023-04-06 MED ORDER — FENTANYL CITRATE (PF) 100 MCG/2ML IJ SOLN
INTRAMUSCULAR | Status: DC | PRN
  Administered 2023-04-06: 25 ug via INTRAVENOUS

## 2023-04-06 MED ORDER — METOPROLOL SUCCINATE ER 50 MG PO TB24
50.0000 mg | ORAL_TABLET | Freq: Every day | ORAL | Status: DC
  Administered 2023-04-07: 50 mg via ORAL
  Filled 2023-04-06: qty 1

## 2023-04-06 MED ORDER — HEPARIN SODIUM (PORCINE) 5000 UNIT/ML IJ SOLN
5000.0000 [IU] | Freq: Three times a day (TID) | INTRAMUSCULAR | Status: DC
  Administered 2023-04-06 – 2023-04-07 (×3): 5000 [IU] via SUBCUTANEOUS
  Filled 2023-04-06 (×3): qty 1

## 2023-04-06 MED ORDER — ASPIRIN 81 MG PO CHEW: 81.0000 mg | CHEWABLE_TABLET | ORAL | Status: DC

## 2023-04-06 MED ORDER — HEPARIN SODIUM (PORCINE) 1000 UNIT/ML IJ SOLN
INTRAMUSCULAR | Status: DC | PRN
  Administered 2023-04-06: 3500 [IU] via INTRAVENOUS

## 2023-04-06 MED ORDER — SODIUM CHLORIDE 0.9 % IV SOLN: INTRAVENOUS | Status: AC

## 2023-04-06 MED ORDER — VERAPAMIL HCL 2.5 MG/ML IV SOLN
INTRAVENOUS | Status: AC
  Filled 2023-04-06: qty 2

## 2023-04-06 MED ORDER — SODIUM CHLORIDE 0.9 % IV SOLN
INTRAVENOUS | Status: DC | PRN
  Administered 2023-04-06: 200 mL via INTRAVENOUS

## 2023-04-06 MED ORDER — LIDOCAINE HCL 1 % IJ SOLN
INTRAMUSCULAR | Status: AC
  Filled 2023-04-06: qty 20

## 2023-04-06 MED ORDER — HYDRALAZINE HCL 20 MG/ML IJ SOLN: 10.0000 mg | INTRAMUSCULAR | Status: AC | PRN

## 2023-04-06 SURGICAL SUPPLY — 13 items
CATH BALLN WEDGE 5F 110CM (CATHETERS) IMPLANT
CATH INFINITI AMBI 5FR TG (CATHETERS) IMPLANT
DEVICE RAD TR BAND REGULAR (VASCULAR PRODUCTS) IMPLANT
DRAPE BRACHIAL (DRAPES) IMPLANT
GLIDESHEATH SLEND SS 6F .021 (SHEATH) IMPLANT
GUIDEWIRE INQWIRE 1.5J.035X260 (WIRE) IMPLANT
INQWIRE 1.5J .035X260CM (WIRE) ×1
PACK CARDIAC CATH (CUSTOM PROCEDURE TRAY) ×1 IMPLANT
PANNUS RETENTION SYSTEM 2 PAD (MISCELLANEOUS) IMPLANT
PROTECTION STATION PRESSURIZED (MISCELLANEOUS) ×1
SET ATX-X65L (MISCELLANEOUS) IMPLANT
SHEATH GLIDE SLENDER 4/5FR (SHEATH) IMPLANT
STATION PROTECTION PRESSURIZED (MISCELLANEOUS) IMPLANT

## 2023-04-06 NOTE — Progress Notes (Signed)
Rounding Note    Patient Name: Sarah Lane Date of Encounter: 04/06/2023  [facility] Cardiologist: New  Subjective   Patient feels well without chest pain or shortness of breath.  Still has some word finding difficulty related to her stroke in June.  Inpatient Medications    Scheduled Meds:  [MAR Hold] aspirin  81 mg Oral Daily   [MAR Hold] atorvastatin  40 mg Oral Daily   [MAR Hold] Chlorhexidine Gluconate Cloth  6 each Topical Daily   [MAR Hold] famotidine  20 mg Oral Daily   heparin  5,000 Units Subcutaneous Q8H   [MAR Hold] insulin aspart  0-5 Units Subcutaneous QHS   [MAR Hold] insulin aspart  0-9 Units Subcutaneous TID WC   [MAR Hold] insulin glargine-yfgn  8 Units Subcutaneous Daily   [MAR Hold] metoprolol succinate  25 mg Oral Daily   [START ON 04/07/2023] multivitamin with minerals  1 tablet Oral Daily   sodium chloride flush  3 mL Intravenous Q12H   Continuous Infusions:  sodium chloride     sodium chloride 1,000 mL (04/06/23 1009)   sodium chloride     sodium chloride     [MAR Hold] magnesium sulfate bolus IVPB     PRN Meds: sodium chloride, [MAR Hold] dextrose, [MAR Hold] docusate sodium, [MAR Hold] heparin sodium (porcine), hydrALAZINE, [MAR Hold] ipratropium-albuterol, labetalol, [MAR Hold] loperamide, [MAR Hold] ondansetron (ZOFRAN) IV, [MAR Hold] mouth rinse, [MAR Hold] polyethylene glycol, sodium chloride flush   Vital Signs    Vitals:   04/06/23 1215 04/06/23 1230 04/06/23 1245 04/06/23 1300  BP: (!) 128/58 138/76 114/71 113/61  Pulse: 80 76 73 75  Resp: 16 20 18 18   Temp:      TempSrc:      SpO2: 100% 100% 99% 96%  Weight:      Height:       No intake or output data in the 24 hours ending 04/06/23 1329    04/06/2023    5:04 AM 04/05/2023    6:39 AM 04/04/2023    5:00 AM  Last 3 Weights  Weight (lbs) 162 lb 14.7 oz 162 lb 4.8 oz 182 lb 8.7 oz  Weight (kg) 73.9 kg 73.619 kg 82.8 kg      Telemetry    Normal sinus rhythm and  PVCs.  32nd episode of SVT noted - Personally Reviewed  ECG    No new tracing.  Physical Exam   GEN: No acute distress.   Neck: No JVD Cardiac: RRR, no murmurs, rubs, or gallops.  Respiratory: Clear to auscultation bilaterally. GI: Soft, nontender, non-distended  MS: No edema; No deformity. Neuro:  Nonfocal  Psych: Normal affect   Labs    High Sensitivity Troponin:   Recent Labs  Lab 03/31/23 1449 03/31/23 1751 03/31/23 2030 03/31/23 2308  TROPONINIHS 213* 295* 495* 475*     Chemistry Recent Labs  Lab 03/31/23 1103 03/31/23 1449 04/01/23 0406 04/01/23 1033 04/01/23 1913 04/02/23 0420 04/04/23 0406 04/05/23 0512 04/06/23 0644 04/06/23 1133  NA 136   < > 138   < > 135   < > 140 141 140 141  K 4.8   < > 3.7   < > 3.9   < > 3.4* 3.4* 3.6 3.5  CL 88*   < > 98   < > 99   < > 106 105 106  --   CO2 <7*   < > 24   < > 23   < > 26  25 27  --   GLUCOSE 152*   < > 132*   < > 151*   < > 106* 93 90  --   BUN 73*   < > 36*   < > 26*   < > 18 16 15   --   CREATININE 7.31*   < > 3.14*   < > 2.91*   < > 1.91* 1.72* 1.45*  --   CALCIUM 10.0   < > 8.1*   < > 7.9*   < > 8.3* 8.6* 8.8*  --   MG  --    < > 2.5*  --   --    < > 1.7 1.7 1.5*  --   PROT 8.5*  --  5.8*  --   --   --   --   --   --   --   ALBUMIN 4.1  --  2.9*  --  2.8*  --   --   --   --   --   AST 52*  --  26  --   --   --   --   --   --   --   ALT 35  --  21  --   --   --   --   --   --   --   ALKPHOS 61  --  38  --   --   --   --   --   --   --   BILITOT 1.2  --  1.1  --   --   --   --   --   --   --   GFRNONAA 6*   < > 17*   < > 18*   < > 30* 34* 42*  --   ANIONGAP NOT CALCULATED   < > 16*   < > 13   < > 8 11 7   --    < > = values in this interval not displayed.    Lipids No results for input(s): "CHOL", "TRIG", "HDL", "LABVLDL", "LDLCALC", "CHOLHDL" in the last 168 hours.  Hematology Recent Labs  Lab 04/04/23 0406 04/05/23 0512 04/06/23 0644 04/06/23 1133  WBC 7.5 6.5 7.6  --   RBC 4.53 4.52 4.66  --    HGB 12.8 13.0 13.3 12.6  HCT 38.4 37.9 38.6 37.0  MCV 84.8 83.8 82.8  --   MCH 28.3 28.8 28.5  --   MCHC 33.3 34.3 34.5  --   RDW 13.2 13.2 13.4  --   PLT 140* 169 213  --    Thyroid  Recent Labs  Lab 03/31/23 1449  TSH 0.924    BNPNo results for input(s): "BNP", "PROBNP" in the last 168 hours.  DDimer No results for input(s): "DDIMER" in the last 168 hours.   Radiology    CARDIAC CATHETERIZATION  Result Date: 04/06/2023 Conclusions: Severe single-vessel coronary artery disease with occlusion of distal rPLA and collateral filling of distal rPL branch.  There is mild, nonobstructive disease involving the proximal LAD and proximal RCA. Normal left and right heart filling pressures. Mild pulmonary hypertension. Normal Fick CO/CI.  Recommendations: Secondary mention of coronary artery disease, including indefinite aspirin as well as high intensity statin therapy.  Occlusion of distal R PLA is likely subacute to chronic given collateralization.  Lesion is too small and distal for PCI. Escalate all directed medical therapy for nonischemic cardiomyopathy, as systolic dysfunction is out of proportion to  coronary artery disease.  If renal function allows, consider addition of ARB tomorrow. Maintain net even fluid balance; will hydrate gently postcatheterization in the setting of resolving acute kidney injury. Yvonne Kendall, MD [facility]   Cardiac Studies   See [facility] above.  TTE (2023/05/01):  1. Left ventricular ejection fraction, by estimation, is 30 to 35%. Left  ventricular ejection fraction by 2D MOD biplane is 38.4 %. Left  ventricular ejection fraction by PLAX is 29 %. The left ventricle has  moderately decreased function. The left  ventricle demonstrates global hypokinesis. Left ventricular diastolic  parameters are consistent with Grade I diastolic dysfunction (impaired  relaxation).   2. Right ventricular systolic function is moderately reduced. The right  ventricular size  is moderately enlarged. There is mildly elevated  pulmonary artery systolic pressure. The estimated right ventricular  systolic pressure is 42.3 mmHg.   3. The mitral valve is normal in structure. Mild mitral valve  regurgitation. No evidence of mitral stenosis.   4. Tricuspid valve regurgitation is mild to moderate.   5. The aortic valve is tricuspid. Aortic valve regurgitation is not  visualized. No aortic stenosis is present.   6. The inferior vena cava is normal in size with <50% respiratory  variability, suggesting right atrial pressure of 8 mmHg.   Patient Profile     57 y.o. female with history of recent stroke, poorly controlled diabetes mellitus, recently diagnosed cardiomyopathy (LVEF 15-20% by echo in 01/2023 at [facility], improved to 30-35% this admission), hypertension, PSVT, seizures, and right lower lobe pulmonary nodule, whom we are seeing due to cardiomyopathy and elevated troponin in the setting of euglycemic DKA, marked acid-base disturbances, and pancreatitis.  Assessment & Plan    Chronic HFrEF: Patient appears euvolemic.  Right and left heart catheterization today showed normal left and right heart filling pressures as well as mild pulmonary hypertension.  Distal branch of RCA was occluded, otherwise no significant CAD was identified consistent with nonischemic cardiomyopathy. -Gentle postcatheterization hydration in the setting of resolving AKI.  Otherwise, maintain net even fluid balance.  Defer standing diuretics at this time. -Increase metoprolol succinate to 50 mg daily. -Consider adding losartan tomorrow if renal function continues to improve. -Avoid SGLT2 inhibitors in the setting of euglycemic DKA on admission.  Coronary artery disease and demand ischemia: Mild troponin elevation most consistent with supply-demand mismatch.  Cath showed occlusion of distal R PLA with left-to-right collaterals, suggesting subacute or chronic occlusion. -Continue aspirin 81 mg  daily. -Continue high intensity statin therapy.  DKA, type 2 diabetes mellitus, and acute renal failure with metabolic acidosis: Improving. -Ongoing management per IM and nephrology. -Recommend avoidance of SGLT2 inhibitors in the future.  History of stroke: Some word finding difficulty still present. -Continue aspirin and statin therapy for secondary prevention. -Defer ongoing rehabilitation needs to IM and PT/OT.  For questions or updates, please contact [facility] Please consult [email or web address] for contact info under [facility].     Signed, Yvonne Kendall, MD  04/06/2023, 1:29 PM

## 2023-04-06 NOTE — Brief Op Note (Signed)
BRIEF CARDIAC CATHETERIZATION NOTE  04/06/2023  11:47 AM  PATIENT:  Sarah Lane  57 y.o. female  PRE-OPERATIVE DIAGNOSIS:  heart failure with reduced ejection fraction and demand ischemia  POST-OPERATIVE DIAGNOSIS:  Chronic HFrEF and NSTEMI  PROCEDURE:  Procedure(s): RIGHT/LEFT HEART CATH AND CORONARY ANGIOGRAPHY (N/A)  SURGEON:  Surgeons and Role:    * Chenelle Benning, MD - Primary  FINDINGS: Single-vessel CAD with occlusion of distal rPLA and collateral filling of distal rPL branch. Normal left and right heart filling pressures. Normal Fick CO/CI.  RECOMMENDATIONS: Secondary prevention of CAD. Escalate GDMT for NICM.  Yvonne Kendall, MD [facility]

## 2023-04-06 NOTE — Progress Notes (Signed)
[facility]  ROUNDING NOTE   Subjective:   Patient seen sitting in chair Alert and oriented States she having a procedure done on her hands today  Creatinine 1.45  Objective:  Vital signs in last 24 hours:  Temp:  [97.7 F (36.5 C)-98.4 F (36.9 C)] 97.7 F (36.5 C) (08/27 0951) Pulse Rate:  [73-84] 75 (08/27 1300) Resp:  [14-20] 18 (08/27 1300) BP: (104-150)/(56-85) 113/61 (08/27 1300) SpO2:  [96 %-100 %] 96 % (08/27 1300) Weight:  [73.9 kg] 73.9 kg (08/27 0504)  Weight change: 0.281 kg Filed Weights   04/04/23 0500 04/05/23 0639 04/06/23 0504  Weight: 82.8 kg 73.6 kg 73.9 kg    Intake/Output: I/O last 3 completed shifts: In: 480 [P.O.:480] Out: -    Intake/Output this shift:  No intake/output data recorded.  Physical Exam: General: NAD,sitting in chair  Head: Normocephalic, atraumatic. Moist oral mucosal membranes  Eyes: Anicteric  Lungs:  Clear to auscultation  Heart: Regular rate and rhythm  Abdomen:  Soft, nontender  Extremities: No peripheral edema.  Neurologic: Nonfocal, moving all four extremities  Skin: No lesions  Access: None    Basic Metabolic Panel: Recent Labs  Lab 03/31/23 1752 03/31/23 2030 04/01/23 0406 04/01/23 1033 04/01/23 1913 04/02/23 0420 04/03/23 0418 04/04/23 0406 04/05/23 0512 04/06/23 0644 04/06/23 1133  NA  --    < > 138   < > 135 139 140 140 141 140 141  K  --    < > 3.7   < > 3.9 3.8 3.4* 3.4* 3.4* 3.6 3.5  CL  --    < > 98   < > 99 102 104 106 105 106  --   CO2  --    < > 24   < > 23 24 26 26 25 27   --   GLUCOSE  --    < > 132*   < > 151* 108* 99 106* 93 90  --   BUN  --    < > 36*   < > 26* 15 17 18 16 15   --   CREATININE  --    < > 3.14*   < > 2.91* 1.92* 1.95* 1.91* 1.72* 1.45*  --   CALCIUM  --    < > 8.1*   < > 7.9* 7.9* 8.3* 8.3* 8.6* 8.8*  --   MG 1.1*   < > 2.5*  --   --  2.2 2.1 1.7 1.7 1.5*  --   PHOS 7.8*  --  2.0*  --  2.8 1.9*  --  3.1  --   --   --    < > = values in this interval not  displayed.    Liver Function Tests: Recent Labs  Lab 03/31/23 1103 04/01/23 0406 04/01/23 1913  AST 52* 26  --   ALT 35 21  --   ALKPHOS 61 38  --   BILITOT 1.2 1.1  --   PROT 8.5* 5.8*  --   ALBUMIN 4.1 2.9* 2.8*   Recent Labs  Lab 03/31/23 1103 03/31/23 1449 04/01/23 0406 04/01/23 1033  LIPASE 240*  --  198*  --   AMYLASE  --  411*  --  517*   No results for input(s): "AMMONIA" in the last 168 hours.  CBC: Recent Labs  Lab 03/31/23 1005 03/31/23 1451 04/02/23 0420 04/03/23 0418 04/04/23 0406 04/05/23 0512 04/06/23 0644 04/06/23 1133  WBC 15.0*   < > 7.1 7.5 7.5 6.5  7.6  --   NEUTROABS 13.5*  --  4.8  --   --   --   --   --   HGB 17.3*   < > 11.5* 11.3* 12.8 13.0 13.3 12.6  HCT 57.9*   < > 33.9* 33.9* 38.4 37.9 38.6 37.0  MCV 94.3   < > 84.3 86.3 84.8 83.8 82.8  --   PLT 316   < > 126* 109* 140* 169 213  --    < > = values in this interval not displayed.    Cardiac Enzymes: No results for input(s): "CKTOTAL", "CKMB", "CKMBINDEX", "TROPONINI" in the last 168 hours.  BNP: Invalid input(s): "POCBNP"  CBG: Recent Labs  Lab 04/05/23 1722 04/05/23 2120 04/06/23 0813 04/06/23 0958 04/06/23 1207  GLUCAP 174* 113* 104* 125* 76    Microbiology: Results for orders placed or performed during the hospital encounter of 03/31/23  Resp panel by RT-PCR (RSV, Flu A&B, Covid) Anterior Nasal Swab     Status: None   Collection Time: 03/31/23 10:06 AM   Specimen: Anterior Nasal Swab  Result Value Ref Range Status   SARS Coronavirus 2 by RT PCR NEGATIVE NEGATIVE Final    Comment: (NOTE) SARS-CoV-2 target nucleic acids are NOT DETECTED.  The SARS-CoV-2 RNA is generally detectable in upper respiratory specimens during the acute phase of infection. The lowest concentration of SARS-CoV-2 viral copies this assay can detect is 138 copies/mL. A negative result does not preclude SARS-Cov-2 infection and should not be used as the sole basis for treatment or other  patient management decisions. A negative result may occur with  improper specimen collection/handling, submission of specimen other than nasopharyngeal swab, presence of viral mutation(s) within the areas targeted by this assay, and inadequate number of viral copies(<138 copies/mL). A negative result must be combined with clinical observations, patient history, and epidemiological information. The expected result is Negative.  Fact Sheet for Patients:  BloggerCourse.com  Fact Sheet for Healthcare Providers:  SeriousBroker.it  This test is no t yet approved or cleared by the [place] FDA and  has been authorized for detection and/or diagnosis of SARS-CoV-2 by FDA under an Emergency Use Authorization (EUA). This EUA will remain  in effect (meaning this test can be used) for the duration of the COVID-19 declaration under Section 564(b)(1) of the Act, 21 U.S.C.section 360bbb-3(b)(1), unless the authorization is terminated  or revoked sooner.       Influenza A by PCR NEGATIVE NEGATIVE Final   Influenza B by PCR NEGATIVE NEGATIVE Final    Comment: (NOTE) The Xpert Xpress SARS-CoV-2/FLU/RSV plus assay is intended as an aid in the diagnosis of influenza from Nasopharyngeal swab specimens and should not be used as a sole basis for treatment. Nasal washings and aspirates are unacceptable for Xpert Xpress SARS-CoV-2/FLU/RSV testing.  Fact Sheet for Patients: BloggerCourse.com  Fact Sheet for Healthcare Providers: SeriousBroker.it  This test is not yet approved or cleared by the [place] FDA and has been authorized for detection and/or diagnosis of SARS-CoV-2 by FDA under an Emergency Use Authorization (EUA). This EUA will remain in effect (meaning this test can be used) for the duration of the COVID-19 declaration under Section 564(b)(1) of the Act, 21 U.S.C. section  360bbb-3(b)(1), unless the authorization is terminated or revoked.     Resp Syncytial Virus by PCR NEGATIVE NEGATIVE Final    Comment: (NOTE) Fact Sheet for Patients: BloggerCourse.com  Fact Sheet for Healthcare Providers: SeriousBroker.it  This test is not yet approved or  cleared by the [place] and has been authorized for detection and/or diagnosis of SARS-CoV-2 by FDA under an Emergency Use Authorization (EUA). This EUA will remain in effect (meaning this test can be used) for the duration of the COVID-19 declaration under Section 564(b)(1) of the Act, 21 U.S.C. section 360bbb-3(b)(1), unless the authorization is terminated or revoked.  Performed at [facility], [address] [place], [place] [address]   MRSA Next Gen by PCR, Nasal     Status: Abnormal   Collection Time: 03/31/23  2:23 PM   Specimen: Nasal Mucosa; Nasal Swab  Result Value Ref Range Status   MRSA by PCR Next Gen DETECTED (A) NOT DETECTED Final    Comment: RESULT CALLED TO, READ BACK BY AND VERIFIED WITH: CHARLIE CORN 03/31/23 1546 KLW (NOTE) The GeneXpert MRSA Assay (FDA approved for NASAL specimens only), is one component of a comprehensive MRSA colonization surveillance program. It is not intended to diagnose MRSA infection nor to guide or monitor treatment for MRSA infections. Test performance is not FDA approved in patients less than 74 years old. Performed at [facility], [address] [place], [place] [address]   Blood culture (routine x 2)     Status: None   Collection Time: 03/31/23  2:49 PM   Specimen: BLOOD  Result Value Ref Range Status   Specimen Description BLOOD LEFT ANTECUBITAL  Final   Special Requests   Final    BOTTLES DRAWN AEROBIC AND ANAEROBIC Blood Culture results may not be optimal due to an inadequate volume of blood received in culture bottles   Culture   Final    NO GROWTH 5 DAYS Performed at  [facility], [address] [place], [place] [address]    Report Status 04/05/2023 FINAL  Final  Blood culture (routine x 2)     Status: None   Collection Time: 03/31/23  3:01 PM   Specimen: BLOOD  Result Value Ref Range Status   Specimen Description BLOOD BLOOD LEFT HAND  Final   Special Requests   Final    BOTTLES DRAWN AEROBIC ONLY Blood Culture results may not be optimal due to an inadequate volume of blood received in culture bottles   Culture   Final    NO GROWTH 5 DAYS Performed at [facility], [address] [place], [place] [address]    Report Status 04/05/2023 FINAL  Final  Urine Culture     Status: None   Collection Time: 03/31/23  5:04 PM   Specimen: Urine, Random  Result Value Ref Range Status   Specimen Description   Final    URINE, RANDOM Performed at [facility], [address], [place], [place] [address]    Special Requests   Final    NONE Performed at [facility], [address] [place], [place] [address]    Culture   Final    NO GROWTH Performed at [facility], [address], [place], [place] [address]    Report Status 04/02/2023 FINAL  Final    Coagulation Studies: No results for input(s): "LABPROT", "INR" in the last 72 hours.  Urinalysis: No results for input(s): "COLORURINE", "LABSPEC", "PHURINE", "GLUCOSEU", "HGBUR", "BILIRUBINUR", "KETONESUR", "PROTEINUR", "UROBILINOGEN", "NITRITE", "LEUKOCYTESUR" in the last 72 hours.  Invalid input(s): "APPERANCEUR"     Imaging: No results found.   Medications:    sodium chloride     sodium chloride 1,000 mL (04/06/23 1009)   sodium chloride  sodium chloride     [MAR Hold] magnesium sulfate bolus IVPB      [MAR Hold] aspirin  81 mg Oral Daily   [MAR Hold] atorvastatin  40 mg Oral Daily   [MAR Hold] Chlorhexidine Gluconate Cloth  6 each Topical Daily   [MAR Hold] famotidine  20 mg Oral Daily   heparin  5,000 Units Subcutaneous Q8H   [MAR  Hold] insulin aspart  0-5 Units Subcutaneous QHS   [MAR Hold] insulin aspart  0-9 Units Subcutaneous TID WC   [MAR Hold] insulin glargine-yfgn  8 Units Subcutaneous Daily   [MAR Hold] metoprolol succinate  25 mg Oral Daily   [START ON 04/07/2023] multivitamin with minerals  1 tablet Oral Daily   sodium chloride flush  3 mL Intravenous Q12H   sodium chloride, [MAR Hold] dextrose, [MAR Hold] docusate sodium, [MAR Hold] heparin sodium (porcine), hydrALAZINE, [MAR Hold] ipratropium-albuterol, labetalol, [MAR Hold] loperamide, [MAR Hold] ondansetron (ZOFRAN) IV, [MAR Hold] mouth rinse, [MAR Hold] polyethylene glycol, sodium chloride flush  Assessment/ Plan:  [name] is a 57 y.o.  female with diabetes mellitus type II, seizure disorder, hypertension, history of ovarian cancer who is admitted to [facility] on 03/31/2023 for Sepsis (HCC) [A41.9]  Acute kidney injury with history proteinuria: Baseline creatinine was 0.76 on 02/19/2023. Serologic work up negative on 8/22. No obstruction on CT. No IV contrast exposure. Required CRRT for 1 day. Foley catheter in place - holding home metformin.   - holding empagliflozin - holding furosemide - Renal function continues to improve - Will schedule outpatient follow up in our office at discharge.   Lab Results  Component Value Date   CREATININE 1.45 (H) 04/06/2023   CREATININE 1.72 (H) 04/05/2023   CREATININE 1.91 (H) 04/04/2023   No intake or output data in the 24 hours ending 04/06/23 1310  2. Hypokalemia:   - Corrected to 3.5 with oral supplementation   3. 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 Not well controlled, hemoglobin A1c of 8.2%.   - holding metformin and empagliflozin  -primary team to manage sliding scale insulin  4. Hypertension, essential: now with hypotension on midodrine. Required vasopressors this admission. Home regimen of metoprolol and furosemide.   Receiving Metoprolol only.    LOS: 6 Batya Citron 8/27/20241:10 PM

## 2023-04-06 NOTE — Progress Notes (Signed)
[facility]  - [place] [facility]   PATIENT NAME: Sarah Lane    MR#:  [id number]  DATE OF BIRTH:  13-Aug-1965  SUBJECTIVE:  no family at bedside. Patient seen in special recovery. She is status post cardiac cath. Denies chest pain. Tolerating snack given in specials.  VITALS:  Blood pressure 121/67, pulse 81, temperature 97.7 F (36.5 C), temperature source Oral, resp. rate 20, height 5\' 1"  (1.549 m), weight 73.9 kg, SpO2 96%.  PHYSICAL EXAMINATION:   GENERAL:  57 y.o.-year-old patient with no acute distress.  LUNGS: Normal breath sounds bilaterally, no wheezing CARDIOVASCULAR: S1, S2 normal. No murmur   ABDOMEN: Soft, nontender, nondistended.  EXTREMITIES: trace edema b/l.    NEUROLOGIC: nonfocal  patient is alert and awake, has chronic mild dysarthria. Marland Kitchen   LABORATORY PANEL:  CBC Recent Labs  Lab 04/06/23 0644 04/06/23 1133  WBC 7.6  --   HGB 13.3 12.6  HCT 38.6 37.0  PLT 213  --     Chemistries  Recent Labs  Lab 04/01/23 0406 04/01/23 1033 04/06/23 0644 04/06/23 1133  NA 138   < > 140 141  K 3.7   < > 3.6 3.5  CL 98   < > 106  --   CO2 24   < > 27  --   GLUCOSE 132*   < > 90  --   BUN 36*   < > 15  --   CREATININE 3.14*   < > 1.45*  --   CALCIUM 8.1*   < > 8.8*  --   MG 2.5*   < > 1.5*  --   AST 26  --   --   --   ALT 21  --   --   --   ALKPHOS 38  --   --   --   BILITOT 1.1  --   --   --    < > = values in this interval not displayed.   Assessment and Plan  Sarah Lane is a 57 y.o. female  with  PMH of type II diabetes mellitus, seizures, HTN, dysrhythmia, and ovarian cancer.  She presented to [facility] on 08/21 via EMS with c/o nausea/vomiting/diarrhea/abdominal pain with inability to tolerate po's.  Onset of symptoms the night of 08/20.  Pt reports her CBG's have been running "High"although she has been taking and tolerating her metformin, jardiance, and insulin.  Upon EMS arrival at pts home she was hypoglycemic with CBG's in the  30's. She received 250 ml bolus of D10W with CBG increasing to 86.  She reports she is living with friends and has had contact with someone recently diagnosed wit   CT Abd Pelvis without oral or iv contrast: Suboptimal exam. No acute inflammatory process identified within the abdomen or pelvis. No discrete metastatic disease identified. Multiple other nonacute observations, as described above. Aortic Atherosclerosis (ICD10-I70.0).   CT Head WO Contrast: No acute intracranial abnormality. 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 Mild microvascular ischemic changes.     Significant Hospital Events: pt was in the ICU earleir   08/21: Pt admitted with euglycemic DKA suspect secondary to SGLT 2 requiring insulin gtt, acute metabolic encephalopathy, sepsis secondary to possible acute pancreatitis and/or UTI, severe lactic/metabolic acidosis, and acute kidney injury.  Nephrology consulted and CRRT initiated  08/22: Pt remains anuric on CRRT lactic/metabolic acidosis improving.  Beta-hydroxy remains elevated continuing insulin gtt until normalized  08/23: DKA resolved,  weaned off vasopressors, BP remains soft. Renal Indices and UOP improved, plan to stop CRRT today. Echo from yesterday with HFrEF (EF 30-35%) and moderate reduction in RV function; will consult Cardiology/HF. 8/24-25--Dr Myriam Forehand 8/26-- patient sitting out in the recliner. Denies any other complaints. Tolerating PO diet. She is going to get cardiac catheterization per cardiology evaluation. Did very well with physical therapy recommends home health PT. 8/27-- status post cardiac cath. Overall doing well. Cardiology to add ARB tomorrow if kidney numbers remains stable.  Acute renal failure with metabolic acidosis in the setting of DKA suspect due to SGLT2 -- patient now off insulin drip. -- Labs improved. -- Patient did require CR RT temporary. -- Making good urine. -- Followed by  nephrology --overall creat stable at 1.4.  Heart failure acute systolic demand ischemia -- followed by [facility] cardiology -- Echo shows EF of 30 to 35% this admission. Previous echo in June 2024 showed EF of 15 to 20% at [facility] -- cardiology recommends right and left heart catheterization -- continue beta-blockers -- follow cardiology recommendation for GDMT as labs improve --AVOID SGLT2 --on RA --s/p cath--Conclusions: Severe single-vessel coronary artery disease with occlusion of distal rPLA and collateral filling of distal rPL branch.  There is mild, nonobstructive disease involving the proximal LAD and proximal RCA. Normal left and right heart filling pressures. Mild pulmonary hypertension. Normal Fick CO/CI.   Recommendations: Secondary mention of coronary artery disease, including indefinite aspirin as well as high intensity statin therapy.  Occlusion of distal R PLA is likely subacute to chronic given collateralization.  Lesion is too small and distal for PCI. Escalate all directed medical therapy for nonischemic cardiomyopathy, as systolic dysfunction is out of proportion to coronary artery disease.  If renal function allows, consider addition of ARB tomorrow. Maintain net even fluid balance; will hydrate gently postcatheterization in the setting of resolving acute kidney injury.    Type II diabetes -- continue current insulin regimen adjust according to sugars  History of CVA -- status post thrombectomy at [facility] in June 2024 -- continue aspirin on Lipitor  Hypophosphatemia, hypokalemia and hypomagnesemia: -- Replete potassium and monitor levels  Elevated liver enzymes: Improved  Dysphagia: She is on dysphagia 3 diet.    1.4 cm right lower lobe pulmonary nodule: Stable since study in 2012.   PT OT recommends home health. If creatinine remains stable patient should be able to discharge tomorrow if okay with nephrology and cardiology   Procedures:cath Family communication  :none Consults : [facility] cardiology, nephrology CODE STATUS: full DVT Prophylaxis : heparin Level of care: Med-Surg Status is: Inpatient Remains inpatient appropriate because: s/p left heart catheterization. TOC for discharge planning    TOTAL TIME TAKING CARE OF THIS PATIENT: 35 minutes.  >50% time spent on counselling and coordination of care  Note: This dictation was prepared with Dragon dictation along with smaller phrase technology. Any transcriptional errors that result from this process are unintentional.  Enedina Finner M.D    [facility]   CC: Primary care physician; Care, [facility]

## 2023-04-06 NOTE — Progress Notes (Signed)
Nutrition Follow Up Note   DOCUMENTATION CODES:   Obesity unspecified  INTERVENTION:   Discontinue Ensure Enlive   Discontinue rena-vite   Add Magic cup TID with meals, each supplement provides 290 kcal and 9 grams of protein  Add MVI po daily   NUTRITION DIAGNOSIS:   Inadequate oral intake related to acute illness as evidenced by meal completion < 25%. -resolving   GOAL:   Patient will meet greater than or equal to 90% of their needs -progressing   MONITOR:   Supplement acceptance, PO intake, Weight trends, Labs, Skin, I & O's  ASSESSMENT:   57 yo female with a PMH of type II diabetes mellitus, seizures, HTN, dysrhythmia, cardiomyopathy, CHF, stroke s/p thrombectomy, hepatosteatosis, lung nodule and ovarian cancer who is admitted with nausea, vomiting and abdominal pain and was found to have sepsis, DKA and AKI.  Pt with improved appetite and oral intake; pt eating 50-100% of meals in hospital. Pt is refusing Ensure supplements; RD will discontinue and add Magic Cups to meal trays. Pt NPO today for cardiac cath. Per chart, pt is down 13lbs since admission; this is likely r/t CRRT. No continued dialysis at this time.   Medications reviewed and include: aspirin, pepcid, heparin, insulin  Labs reviewed: K 3.6 wnl, creat 1.45(H), Mg 1.5(L) P 3.1 wnl- 8/25 Cbgs- 125, 104 x 24 hrs   Diet Order:   Diet Order             Diet NPO time specified Except for: Sips with Meds  Diet effective midnight           Diet NPO time specified Except for: Sips with Meds  Diet effective midnight                  EDUCATION NEEDS:   Education needs have been addressed  Skin:  Skin Assessment: Reviewed RN Assessment (ecchymosis)  Last BM:  8/26- TYPE 6  Height:   Ht Readings from Last 1 Encounters:  03/31/23 5\' 1"  (1.549 m)    Weight:   Wt Readings from Last 1 Encounters:  04/06/23 73.9 kg    Ideal Body Weight:  47.7 kg  BMI:  Body mass index is 30.78  kg/m.  Estimated Nutritional Needs:   Kcal:  1700-1900kcal/day  Protein:  85-95g/day  Fluid:  1.4-1.6L/day  Betsey Holiday MS, RD, LDN Please refer to [facility] for RD and/or RD on-call/weekend/after hours pager

## 2023-04-06 NOTE — Interval H&P Note (Signed)
History and Physical Interval Note:  04/06/2023 10:54 AM  Sarah Lane  has presented today for surgery, with the diagnosis of heart failure with reduced ejection fraction and demand ischemia.  The various methods of treatment have been discussed with the patient and family. After consideration of risks, benefits and other options for treatment, the patient has consented to  Procedure(s): RIGHT/LEFT HEART CATH AND CORONARY ANGIOGRAPHY (N/A) as a surgical intervention.  The patient's history has been reviewed, patient examined, no change in status, stable for surgery.  I have reviewed the patient's chart and labs.  Questions were answered to the patient's satisfaction.    Cath Lab Visit (complete for each Cath Lab visit)  Clinical Evaluation Leading to the Procedure:   ACS: Yes.    Non-ACS:  N/A  [name]

## 2023-04-06 NOTE — Progress Notes (Signed)
[facility] HF Stewardship  PCP: [facility], [facility]  PCP-Cardiologist: None  HPI: Sarah Lane is a 57 y.o. female with type II diabetes mellitus, seizures, HTN, dysrhythmia, cerebrovascular disease and ovarian cancer  who presented with nausea, vomiting, diarrhea, abdominal pain, and inability to tolerate oral intake. Found to have euglycemic DKA thought due to SGLT2i and acute infection. Diagnosed with sepsis and admitted to the ICU  requiring vasopressors and CRRT. DKA resolved, patient was weaned off vasopressors, and transferred to the floor. Cardiology planning [facility] today given improvement in creatinine.  Pertinent cardiac history: Diagnosed with systolic heart failure in 01/2023 at [facility] during admission for CVA s/p thrombectomy where LVEF was noted to be 15-20% on TTE. TTE repeated this admission on 04/01/23 showed LVEF of 30-35% with grade 1 diastolic dysfunction and moderately reduced RV.   Pertinent Lab Values: Creatinine, Ser  Date Value Ref Range Status  04/06/2023 1.45 (H) 0.44 - 1.00 mg/dL Final   BUN  Date Value Ref Range Status  04/06/2023 15 6 - 20 mg/dL Final   Potassium  Date Value Ref Range Status  04/06/2023 3.6 3.5 - 5.1 mmol/L Final   Sodium  Date Value Ref Range Status  04/06/2023 140 135 - 145 mmol/L Final   Magnesium  Date Value Ref Range Status  04/06/2023 1.5 (L) 1.7 - 2.4 mg/dL Final    Comment:    Performed at [facility], [address] [place], [place] [address]   Hgb A1c MFr Bld  Date Value Ref Range Status  03/31/2023 8.2 (H) 4.8 - 5.6 % Final    Comment:    (NOTE) Pre diabetes:          5.7%-6.4%  Diabetes:              >6.4%  Glycemic control for   <7.0% adults with diabetes    TSH  Date Value Ref Range Status  03/31/2023 0.924 0.350 - 4.500 uIU/mL Final    Comment:    Performed by a 3rd Generation assay with a functional sensitivity of <=0.01 uIU/mL. Performed at [facility], [address] [place],  [place] [address]     Vital Signs: Temp:  [97.4 F (36.3 C)-98.4 F (36.9 C)] 98.4 F (36.9 C) (08/27 0723) Pulse Rate:  [76-85] 82 (08/27 0723) Cardiac Rhythm: Supraventricular tachycardia (08/26 2148) Resp:  [14-18] 16 (08/27 0723) BP: (104-158)/(68-88) 144/76 (08/27 0723) SpO2:  [97 %-100 %] 100 % (08/27 0723) Weight:  [73.9 kg (162 lb 14.7 oz)] 73.9 kg (162 lb 14.7 oz) (08/27 0504)   Intake/Output Summary (Last 24 hours) at 04/06/2023 0802 Last data filed at 04/05/2023 1038 Gross per 24 hour  Intake 240 ml  Output --  Net 240 ml    Current Inpatient Medications:    Beta Blocker: Metoprolol succinate 25 mg daily           Prior to admission HF Medications:  Loop Diuretic: Furosemide 40 mg BID Beta Blocker: Metoprolol tartrate 12.5 mg BID Sodium-glucose Cotransporter-2 Inhibitors (SGLT2i): Jardiance 10 mg daily  Assessment: 1. 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 NYHA class I-II symptoms.  -Serum creatinine continues to improve. Pt reports good urine output, not charted. -SBP generally at goal, but elevated to 150s this morning. Noted midodrine use in the ICU. Can consider ACE/ARB/ARNI if creatinine is stable after cath. -Not a candidate for SGLT2i given history of euglycemic DKA  Plan: 1) Medication changes recommended at this time: -None.  Will follow after cath. Can consider losartan 12.5 mg daily if creatinine stable.  2) Patient assistance: Medication Assistance 1: Copay checks Copay checks for: [name], Farxiga, Jardiance $40  3) Education: -- Patient has been educated on current HF medications and potential additions to HF medication regimen - Patient verbalizes understanding that over the next few months, these medication doses may change and more medications may be added to optimize HF regimen - Patient has been educated on basic disease state pathophysiology and goals of therapy  Medication  Assistance / Insurance Benefits Check:  Does the patient have prescription insurance? Prescription Insurance: Commercial (Copywriter, advertising  Type of insurance plan:   Does the patient qualify for medication assistance through manufacturers or grants? No   Outpatient Pharmacy:  [facility]     Thank you for involving pharmacy in this patient's care.  Enos Fling, PharmD, BCPS Phone - [phone] Clinical Pharmacist 04/06/2023 8:02 AM

## 2023-04-07 ENCOUNTER — Other Ambulatory Visit: Payer: Self-pay

## 2023-04-07 ENCOUNTER — Encounter: Payer: Self-pay | Admitting: Internal Medicine

## 2023-04-07 DIAGNOSIS — E1110 Type 2 diabetes mellitus with ketoacidosis without coma: Secondary | ICD-10-CM | POA: Diagnosis not present

## 2023-04-07 DIAGNOSIS — E1169 Type 2 diabetes mellitus with other specified complication: Secondary | ICD-10-CM | POA: Diagnosis not present

## 2023-04-07 DIAGNOSIS — I25118 Atherosclerotic heart disease of native coronary artery with other forms of angina pectoris: Secondary | ICD-10-CM | POA: Diagnosis not present

## 2023-04-07 DIAGNOSIS — I5022 Chronic systolic (congestive) heart failure: Secondary | ICD-10-CM | POA: Diagnosis not present

## 2023-04-07 DIAGNOSIS — I428 Other cardiomyopathies: Secondary | ICD-10-CM | POA: Diagnosis not present

## 2023-04-07 DIAGNOSIS — N179 Acute kidney failure, unspecified: Secondary | ICD-10-CM | POA: Diagnosis not present

## 2023-04-07 DIAGNOSIS — E872 Acidosis, unspecified: Secondary | ICD-10-CM

## 2023-04-07 LAB — CBC
HCT: 37.5 % (ref 36.0–46.0)
Hemoglobin: 12.3 g/dL (ref 12.0–15.0)
MCH: 28.1 pg (ref 26.0–34.0)
MCHC: 32.8 g/dL (ref 30.0–36.0)
MCV: 85.6 fL (ref 80.0–100.0)
Platelets: 249 10*3/uL (ref 150–400)
RBC: 4.38 MIL/uL (ref 3.87–5.11)
RDW: 13.4 % (ref 11.5–15.5)
WBC: 7.4 10*3/uL (ref 4.0–10.5)
nRBC: 0 % (ref 0.0–0.2)

## 2023-04-07 LAB — MAGNESIUM: Magnesium: 1.8 mg/dL (ref 1.7–2.4)

## 2023-04-07 LAB — BASIC METABOLIC PANEL
Anion gap: 10 (ref 5–15)
BUN: 15 mg/dL (ref 6–20)
CO2: 26 mmol/L (ref 22–32)
Calcium: 8.9 mg/dL (ref 8.9–10.3)
Chloride: 104 mmol/L (ref 98–111)
Creatinine, Ser: 1.27 mg/dL — ABNORMAL HIGH (ref 0.44–1.00)
GFR, Estimated: 49 mL/min — ABNORMAL LOW (ref >60)
Glucose, Bld: 102 mg/dL — ABNORMAL HIGH (ref 70–99)
Potassium: 4 mmol/L (ref 3.5–5.1)
Sodium: 140 mmol/L (ref 135–145)

## 2023-04-07 LAB — POCT I-STAT EG7
Acid-Base Excess: 2 mmol/L (ref 0.0–2.0)
Bicarbonate: 26.9 mmol/L (ref 20.0–28.0)
Calcium, Ion: 1.2 mmol/L (ref 1.15–1.40)
HCT: 37 % (ref 36.0–46.0)
Hemoglobin: 12.6 g/dL (ref 12.0–15.0)
O2 Saturation: 69 %
Potassium: 3.5 mmol/L (ref 3.5–5.1)
Sodium: 141 mmol/L (ref 135–145)
TCO2: 28 mmol/L (ref 22–32)
pCO2, Ven: 40.8 mmHg — ABNORMAL LOW (ref 44–60)
pH, Ven: 7.426 (ref 7.25–7.43)
pO2, Ven: 35 mmHg (ref 32–45)

## 2023-04-07 LAB — GLUCOSE, CAPILLARY
Glucose-Capillary: 108 mg/dL — ABNORMAL HIGH (ref 70–99)
Glucose-Capillary: 176 mg/dL — ABNORMAL HIGH (ref 70–99)

## 2023-04-07 LAB — PHOSPHORUS: Phosphorus: 2.9 mg/dL (ref 2.5–4.6)

## 2023-04-07 MED ORDER — FUROSEMIDE 20 MG PO TABS: 20.0000 mg | ORAL_TABLET | Freq: Every day | ORAL | Status: DC | PRN

## 2023-04-07 MED ORDER — LOSARTAN POTASSIUM 25 MG PO TABS
25.0000 mg | ORAL_TABLET | Freq: Every day | ORAL | 2 refills | Status: DC
  Filled 2023-04-07 (×2): qty 30, 30d supply, fill #0

## 2023-04-07 MED ORDER — IOHEXOL 300 MG/ML  SOLN
INTRAMUSCULAR | Status: DC | PRN
  Administered 2023-04-06: 23 mL

## 2023-04-07 MED ORDER — METOPROLOL SUCCINATE ER 50 MG PO TB24
50.0000 mg | ORAL_TABLET | Freq: Every day | ORAL | 2 refills | Status: DC
  Filled 2023-04-07 (×2): qty 30, 30d supply, fill #0

## 2023-04-07 MED ORDER — ADULT MULTIVITAMIN W/MINERALS CH: 1.0000 | ORAL_TABLET | Freq: Every day | ORAL | Status: DC

## 2023-04-07 MED ORDER — LOSARTAN POTASSIUM 25 MG PO TABS
25.0000 mg | ORAL_TABLET | Freq: Every day | ORAL | Status: DC
  Administered 2023-04-07: 25 mg via ORAL
  Filled 2023-04-07: qty 1

## 2023-04-07 MED ORDER — SPIRONOLACTONE 12.5 MG HALF TABLET
12.5000 mg | ORAL_TABLET | Freq: Every day | ORAL | Status: DC
  Administered 2023-04-07: 12.5 mg via ORAL
  Filled 2023-04-07: qty 1

## 2023-04-07 MED ORDER — LOSARTAN POTASSIUM 50 MG PO TABS: 100.0000 mg | ORAL_TABLET | Freq: Every day | ORAL | Status: DC

## 2023-04-07 MED ORDER — FUROSEMIDE 40 MG PO TABS: 20.0000 mg | ORAL_TABLET | Freq: Every day | ORAL | Status: DC | PRN

## 2023-04-07 MED ORDER — SPIRONOLACTONE 25 MG PO TABS
12.5000 mg | ORAL_TABLET | Freq: Every day | ORAL | 2 refills | Status: DC
  Filled 2023-04-07: qty 15, 30d supply, fill #0

## 2023-04-07 MED ORDER — LOSARTAN POTASSIUM 50 MG PO TABS: 50.0000 mg | ORAL_TABLET | Freq: Every day | ORAL | Status: DC

## 2023-04-07 NOTE — Progress Notes (Signed)
Rounding Note    Patient Name: Sarah Lane Date of Encounter: 04/07/2023  [facility] Cardiologist: New  Subjective   R/LHC on 8/27 showed severe single-vessel CAD with an occluded distal rPLA with collateral filling of distal rPL branch. There was mild, nonobstructive disease involving the proximal LAD and proximal RCA. Normal left and right heart filling pressures, mild pulmonary hypertension, and normal CO/CI. No chest pain or dyspnea.  No arteriotomy site complications.  Inpatient Medications    Scheduled Meds:  aspirin  81 mg Oral Daily   atorvastatin  40 mg Oral Daily   Chlorhexidine Gluconate Cloth  6 each Topical Daily   famotidine  20 mg Oral Daily   heparin  5,000 Units Subcutaneous Q8H   insulin aspart  0-5 Units Subcutaneous QHS   insulin aspart  0-9 Units Subcutaneous TID WC   insulin glargine-yfgn  8 Units Subcutaneous Daily   losartan  25 mg Oral Daily   metoprolol succinate  50 mg Oral Daily   multivitamin with minerals  1 tablet Oral Daily   sodium chloride flush  3 mL Intravenous Q12H   Continuous Infusions:  sodium chloride     sodium chloride 1,000 mL (04/06/23 1009)   sodium chloride     magnesium sulfate bolus IVPB 2 g (04/06/23 2122)   PRN Meds: sodium chloride, dextrose, docusate sodium, heparin sodium (porcine), iohexol, ipratropium-albuterol, loperamide, ondansetron (ZOFRAN) IV, mouth rinse, polyethylene glycol, sodium chloride flush   Vital Signs    Vitals:   04/06/23 1700 04/06/23 2014 04/07/23 0100 04/07/23 0500  BP: 100/64 (!) 106/57 120/67   Pulse: 71 76 65   Resp:  16    Temp: 98.1 F (36.7 C) 98.1 F (36.7 C) 98.3 F (36.8 C)   TempSrc: Oral     SpO2: 99% 97% 98%   Weight:    80.6 kg  Height:        Intake/Output Summary (Last 24 hours) at 04/07/2023 0906 Last data filed at 04/06/2023 1703 Gross per 24 hour  Intake 231.25 ml  Output --  Net 231.25 ml      04/07/2023    5:00 AM 04/06/2023    5:04 AM 04/05/2023     6:39 AM  Last 3 Weights  Weight (lbs) 177 lb 11.2 oz 162 lb 14.7 oz 162 lb 4.8 oz  Weight (kg) 80.604 kg 73.9 kg 73.619 kg      Telemetry    SR - Personally Reviewed  ECG    No new tracing.  Physical Exam   GEN: No acute distress.   Neck: No JVD Cardiac: RRR, no murmurs, rubs, or gallops. Arteriotomy site without active bleeding, bruising, swelling, warmth, or erythema. Respiratory: Clear to auscultation bilaterally. GI: Soft, nontender, non-distended  MS: No edema; No deformity. Neuro:  Nonfocal  Psych: Normal affect   Labs    High Sensitivity Troponin:   Recent Labs  Lab 03/31/23 1449 03/31/23 1751 03/31/23 2030 03/31/23 2308  TROPONINIHS 213* 295* 495* 475*     Chemistry Recent Labs  Lab 03/31/23 1103 03/31/23 1449 04/01/23 0406 04/01/23 1033 04/01/23 1913 04/02/23 0420 04/05/23 0512 04/06/23 0644 04/06/23 1131 04/06/23 1133 04/07/23 0627  NA 136   < > 138   < > 135   < > 141 140 141 141 140  K 4.8   < > 3.7   < > 3.9   < > 3.4* 3.6 3.5 3.5 4.0  CL 88*   < > 98   < >  99   < > 105 106  --   --  104  CO2 <7*   < > 24   < > 23   < > 25 27  --   --  26  GLUCOSE 152*   < > 132*   < > 151*   < > 93 90  --   --  102*  BUN 73*   < > 36*   < > 26*   < > 16 15  --   --  15  CREATININE 7.31*   < > 3.14*   < > 2.91*   < > 1.72* 1.45*  --   --  1.27*  CALCIUM 10.0   < > 8.1*   < > 7.9*   < > 8.6* 8.8*  --   --  8.9  MG  --    < > 2.5*  --   --    < > 1.7 1.5*  --   --  1.8  PROT 8.5*  --  5.8*  --   --   --   --   --   --   --   --   ALBUMIN 4.1  --  2.9*  --  2.8*  --   --   --   --   --   --   AST 52*  --  26  --   --   --   --   --   --   --   --   ALT 35  --  21  --   --   --   --   --   --   --   --   ALKPHOS 61  --  38  --   --   --   --   --   --   --   --   BILITOT 1.2  --  1.1  --   --   --   --   --   --   --   --   GFRNONAA 6*   < > 17*   < > 18*   < > 34* 42*  --   --  49*  ANIONGAP NOT CALCULATED   < > 16*   < > 13   < > 11 7  --   --  10   < > =  values in this interval not displayed.    Lipids No results for input(s): "CHOL", "TRIG", "HDL", "LABVLDL", "LDLCALC", "CHOLHDL" in the last 168 hours.  Hematology Recent Labs  Lab 04/05/23 0512 04/06/23 0644 04/06/23 1131 04/06/23 1133 04/07/23 0627  WBC 6.5 7.6  --   --  7.4  RBC 4.52 4.66  --   --  4.38  HGB 13.0 13.3 12.6 12.6 12.3  HCT 37.9 38.6 37.0 37.0 37.5  MCV 83.8 82.8  --   --  85.6  MCH 28.8 28.5  --   --  28.1  MCHC 34.3 34.5  --   --  32.8  RDW 13.2 13.4  --   --  13.4  PLT 169 213  --   --  249   Thyroid  Recent Labs  Lab 03/31/23 1449  TSH 0.924    BNPNo results for input(s): "BNP", "PROBNP" in the last 168 hours.  DDimer No results for input(s): "DDIMER" in the last 168 hours.   Radiology      Cardiac Studies   [facility]  04/06/2023: Conclusions: Severe single-vessel coronary artery disease with occlusion of distal rPLA and collateral filling of distal rPL branch.  There is mild, nonobstructive disease involving the proximal LAD and proximal RCA. Normal left and right heart filling pressures. Mild pulmonary hypertension. Normal Fick CO/CI.   Recommendations: Secondary mention of coronary artery disease, including indefinite aspirin as well as high intensity statin therapy.  Occlusion of distal R PLA is likely subacute to chronic given collateralization.  Lesion is too small and distal for PCI. Escalate all directed medical therapy for nonischemic cardiomyopathy, as systolic dysfunction is out of proportion to coronary artery disease.  If renal function allows, consider addition of ARB tomorrow. Maintain net even fluid balance; will hydrate gently postcatheterization in the setting of resolving acute kidney injury. __________  TTE (04/01/2023):  1. Left ventricular ejection fraction, by estimation, is 30 to 35%. Left  ventricular ejection fraction by 2D MOD biplane is 38.4 %. Left  ventricular ejection fraction by PLAX is 29 %. The left ventricle has   moderately decreased function. The left  ventricle demonstrates global hypokinesis. Left ventricular diastolic  parameters are consistent with Grade I diastolic dysfunction (impaired  relaxation).   2. Right ventricular systolic function is moderately reduced. The right  ventricular size is moderately enlarged. There is mildly elevated  pulmonary artery systolic pressure. The estimated right ventricular  systolic pressure is 42.3 mmHg.   3. The mitral valve is normal in structure. Mild mitral valve  regurgitation. No evidence of mitral stenosis.   4. Tricuspid valve regurgitation is mild to moderate.   5. The aortic valve is tricuspid. Aortic valve regurgitation is not  visualized. No aortic stenosis is present.   6. The inferior vena cava is normal in size with <50% respiratory  variability, suggesting right atrial pressure of 8 mmHg.   Patient Profile     57 y.o. female with history of recent stroke, poorly controlled diabetes mellitus, recently diagnosed cardiomyopathy (LVEF 15-20% by echo in 01/2023 at [place], improved to 30-35% this admission), hypertension, PSVT, seizures, and right lower lobe pulmonary nodule, whom we are seeing due to cardiomyopathy and elevated troponin in the setting of euglycemic DKA, marked acid-base disturbances, and pancreatitis.  Assessment & Plan    Chronic HFrEF: Patient appears euvolemic.  Right and left heart catheterization this admission showed normal left and right heart filling pressures as well as mild pulmonary hypertension.  Distal branch of RCA was occluded, otherwise no significant CAD was identified consistent with nonischemic cardiomyopathy. -Status post gentle postcatheterization hydration in the setting of resolving AKI.  Otherwise, maintain net even fluid balance.   -Continue metoprolol succinate to 50 mg daily. -Start lower dose losartan 25 mg daily and spironolactone 12.5 mg daily. -Avoid SGLT2 inhibitors in the setting of euglycemic DKA on  admission. -Consider transitioning ARB to ARNi in the outpatient setting pending BP and renal function.  -Add Lasix 20 mg daily prn weight gain > 3 pounds overnight, greater than 5 pounds in a week, or with increased shortness of breath.  Coronary artery disease and demand ischemia: Mild troponin elevation most consistent with supply-demand mismatch.  Cath showed occlusion of distal rPLA with left-to-right collaterals, suggesting subacute or chronic occlusion. -Continue aspirin 81 mg daily. -Continue Lipitor 40 mg daily.  DKA, type 2 diabetes mellitus, and acute renal failure with metabolic acidosis: Improving. -Ongoing management per IM and nephrology. -Recommend avoidance of SGLT2 inhibitors in the future.  History of stroke: Some word finding difficulty still present. -Continue aspirin and  statin therapy for secondary prevention. -Defer ongoing rehabilitation needs to IM and PT/OT.   For questions or updates, please contact [facility] Please consult [email or web address] for contact info under [facility].     Signed, Eula Listen, PA-C  04/07/2023, 9:06 AM

## 2023-04-07 NOTE — Progress Notes (Signed)
Discharged. AVS printed and reviewed. Rx medication delivered to room by outpatient pharmacy. All belongings gathered. Transportation home with friend.

## 2023-04-07 NOTE — Progress Notes (Signed)
[facility] HF Stewardship  PCP: [facility]  PCP-Cardiologist: None  HPI: Sarah Lane is a 57 y.o. female with type II diabetes mellitus, seizures, HTN, dysrhythmia, cerebrovascular disease and ovarian cancer  who presented with nausea, vomiting, diarrhea, abdominal pain, and inability to tolerate oral intake. Found to have euglycemic DKA thought due to SGLT2i and acute infection. Diagnosed with sepsis and admitted to the ICU  requiring vasopressors and CRRT. DKA resolved, patient was weaned off vasopressors, and transferred to the floor. Cardiology planning [facility] today given improvement in creatinine.  Pertinent cardiac history: Diagnosed with systolic heart failure in 01/2023 at [facility] during admission for CVA s/p thrombectomy where LVEF was noted to be 15-20% on TTE. TTE repeated this admission on 04/01/23 showed LVEF of 30-35% with grade 1 diastolic dysfunction and moderately reduced RV. Cardiac cath on 04/06/23 showed severe single-vessel coronary artery disease with occlusion of distal rPLA with collateral filling of distal rPL branch. Fick CO/CI normal.  Pertinent Lab Values: Creatinine, Ser  Date Value Ref Range Status  04/07/2023 1.27 (H) 0.44 - 1.00 mg/dL Final   BUN  Date Value Ref Range Status  04/07/2023 15 6 - 20 mg/dL Final   Potassium  Date Value Ref Range Status  04/07/2023 4.0 3.5 - 5.1 mmol/L Final   Sodium  Date Value Ref Range Status  04/07/2023 140 135 - 145 mmol/L Final   Magnesium  Date Value Ref Range Status  04/07/2023 1.8 1.7 - 2.4 mg/dL Final    Comment:    Performed at [facility], [address] [place], [place] [address]   Hgb A1c MFr Bld  Date Value Ref Range Status  03/31/2023 8.2 (H) 4.8 - 5.6 % Final    Comment:    (NOTE) Pre diabetes:          5.7%-6.4%  Diabetes:              >6.4%  Glycemic control for   <7.0% adults with diabetes    TSH  Date Value Ref Range Status  03/31/2023 0.924 0.350 - 4.500 uIU/mL Final     Comment:    Performed by a 3rd Generation assay with a functional sensitivity of <=0.01 uIU/mL. Performed at [facility], [address] [place], [place] [address]     Vital Signs: Temp:  [97.7 F (36.5 C)-98.3 F (36.8 C)] 98.3 F (36.8 C) (08/28 0100) Pulse Rate:  [65-94] 65 (08/28 0100) Cardiac Rhythm: Normal sinus rhythm (08/27 1944) Resp:  [16-20] 16 (08/27 2014) BP: (100-150)/(56-85) 120/67 (08/28 0100) SpO2:  [96 %-100 %] 98 % (08/28 0100) Weight:  [80.6 kg (177 lb 11.2 oz)] 80.6 kg (177 lb 11.2 oz) (08/28 0500)   Intake/Output Summary (Last 24 hours) at 04/07/2023 0848 Last data filed at 04/06/2023 1703 Gross per 24 hour  Intake 231.25 ml  Output --  Net 231.25 ml    Current Inpatient Medications:    Beta Blocker: Metoprolol succinate 50 mg daily           Prior to admission HF Medications:  Loop Diuretic: Furosemide 40 mg BID Beta Blocker: Metoprolol tartrate 12.5 mg BID Sodium-glucose Cotransporter-2 Inhibitors (SGLT2i): Jardiance 10 mg daily  Assessment: 1. 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 NYHA class I-II symptoms.  -Serum creatinine continues to improve. Pt reports good urine output, not charted. -SBP generally at goal, ranging from 100-150s/50-80s.  -Not a candidate for SGLT2i given history of euglycemic DKA  Plan: 1)  Medication changes recommended at this time: -Agree with the addition of losartan. Dose reduced from 100 mg to 25 mg. Spironolactone added today.   2) Patient assistance: Medication Assistance 1: Copay checks Copay checks for: [name], Farxiga, Jardiance $40  3) Education: -- Patient has been educated on current HF medications and potential additions to HF medication regimen - Patient verbalizes understanding that over the next few months, these medication doses may change and more medications may be added to optimize HF regimen - Patient has been educated on  basic disease state pathophysiology and goals of therapy  Medication Assistance / Insurance Benefits Check:  Does the patient have prescription insurance? Prescription Insurance: Commercial (Copywriter, advertising  Type of insurance plan:   Does the patient qualify for medication assistance through manufacturers or grants? No   Outpatient Pharmacy:  [facility]     Thank you for involving pharmacy in this patient's care.  Enos Fling, PharmD, BCPS Phone - [phone] Clinical Pharmacist 04/07/2023 8:48 AM

## 2023-04-07 NOTE — TOC Transition Note (Signed)
Transition of Care [facility]) - CM/SW Discharge Note   Patient Details  Name: Sarah Lane MRN: [id number] Date of Birth: [birth year]  [facility] CM/SW Contact:  Margarito Liner, LCSW Phone Number: 04/07/2023, 2:23 PM   Clinical Narrative:  Patient has orders to discharge home today. [facility] liaison is aware. No further concerns. CSW signing off.   Final next level of care: Home w Home Health Services Barriers to Discharge: Barriers Resolved   Patient Goals and CMS Choice CMS Medicare.gov Compare Post Acute Care list provided to:: Patient Choice offered to / list presented to : Patient  Discharge Placement                  Patient to be transferred to facility by: [name]   Patient and family notified of of transfer: 04/07/23  Discharge Plan and Services Additional resources added to the After Visit Summary for       Post Acute Care Choice: Home Health, Skilled Nursing Facility                    HH Arranged: PT, OT [facility] Agency: [facility] Date [facility] Agency Contacted: 04/07/23   Representative spoke with at [facility] Agency: Chucky May  Social Determinants of Health (SDOH) Interventions SDOH Screenings   Food Insecurity: No Food Insecurity (03/11/2023)   Received from [facility]  Transportation Needs: No Transportation Needs (03/11/2023)   Received from [facility]  Financial Resource Strain: Low Risk  (07/15/2020)   Received from [facility]  Physical Activity: Insufficiently Active (07/15/2020)   Received from [facility]  Social Connections: Socially Isolated (07/15/2020)   Received from [facility]  Stress: No Stress Concern Present (07/15/2020)   Received from [facility]  Tobacco Use: Unknown (03/31/2023)  Health Literacy: Low Risk  (07/15/2020)   Received from [facility]     Readmission Risk Interventions     No data to display

## 2023-04-07 NOTE — Discharge Summary (Addendum)
Physician Discharge Summary   Sarah Lane  female DOB: 02-28-1966  HYQ:[id number]  PCP: [facility]  Admit date: 03/31/2023 Discharge date: 04/07/2023  Admitted From: home Disposition:  home Home Health: Yes CODE STATUS: Full code  Discharge Instructions     AMB Referral to Cardiac Rehabilitation - Phase II   Complete by: As directed    Diagnosis: NSTEMI   After initial evaluation and assessments completed: Virtual Based Care may be provided alone or in conjunction with Phase 2 Cardiac Rehab based on patient barriers.: Yes   Intensive Cardiac Rehabilitation (ICR) [facility] location only OR Traditional Cardiac Rehabilitation (TCR) *If criteria for ICR are not met will enroll in TCR [facility] only): Yes   Diet Carb Modified   Complete by: As directed    Discharge instructions   Complete by: As directed    Cardiology has made changes to your medication regimen.  Please take them as directed.    Please stop taking Jardiance. [facility] Course:  For full details, please see H&P, progress notes, consult notes and ancillary notes.  Briefly,  Sarah Lane is a 57 y.o. female  with PMH of type II diabetes mellitus, seizures, HTN, and ovarian cancer.  She presented to [facility] ER on 08/21 via EMS with c/o nausea/vomiting/diarrhea/abdominal pain with inability to tolerate po's.    Pt reported her CBG's have been running "High" although she has been taking and tolerating her metformin, jardiance, and insulin.  Upon EMS arrival at pts home she was hypoglycemic with CBG's in the 30's. She received 250 ml bolus of D10W with CBG increasing to 86.     08/21: Pt admitted with euglycemic DKA suspect secondary to SGLT 2 requiring insulin gtt, acute metabolic encephalopathy, severe lactic/metabolic acidosis, and acute kidney injury.  Nephrology consulted and CRRT initiated   08/23: DKA resolved, weaned off vasopressors. Renal Indices and UOP improved, stopped CRRT.   Acute renal failure  with metabolic acidosis in the setting of DKA suspect due to SGLT2 -- s/p insulin drip. -- Cr 7.31 on presentation.  Patient did require CRRT temporary. -- Making good urine. --Cr 1.27 prior to discharge. --Jardiance d/c'ed.   Acute on chronic systolic CHF demand ischemia NSTEMI, ruled out -- followed by [facility] cardiology -- Echo shows EF of 30 to 35% this admission. Previous echo in June 2024 showed EF of 15 to 20% at [facility] -- cardiology recommends right and left heart catheterization which found Severe single-vessel coronary artery disease with occlusion of distal rPLA and collateral filling of distal rPL branch.  Lesion is too small and distal for PCI. There is mild, nonobstructive disease involving the proximal LAD and proximal RCA. --switched from home Lopressor to Toprol --d/c Jardiance, and AVOID SGLT2 --cont ASA and statin --added losartan and spironolactone   Type II diabetes -- cont home regimen as below   History of CVA -- status post thrombectomy at [facility] in June 2024 -- continue aspirin on Lipitor   Hypophosphatemia, hypokalemia and hypomagnesemia: -- monitored and supplemented PRN   Elevated liver enzymes: Improved   1.4 cm right lower lobe pulmonary nodule: Stable since study in 2012.   Sepsis, ruled out --no clear source of infection.  Acute Pancreatitis, POA    Discharge Diagnoses:  Principal Problem:   Sepsis (HCC) Active Problems:   Acute renal failure (HCC)   HFrEF (heart failure with reduced ejection fraction) (HCC)   High anion gap metabolic acidosis   Acute encephalopathy  Chronic HFrEF (heart failure with reduced ejection fraction) (HCC)   Demand ischemia   Lactic acidosis   30 Day Unplanned Readmission Risk Score    Flowsheet Row ED to Hosp-Admission (Current) from [facility]  30 Day Unplanned Readmission Risk Score (%) 13.81 Filed at 04/07/2023 1201       This score is the patient's  risk of an unplanned readmission within 30 days of being discharged (0 -100%). The score is based on dignosis, age, lab data, medications, orders, and past utilization.   Low:  0-14.9   Medium: 15-21.9   High: 22-29.9   Extreme: 30 and above         Discharge Instructions:  Allergies as of 04/07/2023       Reactions   Jardiance [empagliflozin]    Multiple side effect complications including: DKA, pancreatitis, renal failure. Avoid re-prescribing   Codeine Nausea And Vomiting   Ibuprofen Nausea And Vomiting        Medication List     STOP taking these medications    Jardiance 10 MG Tabs tablet Generic drug: empagliflozin   metoprolol tartrate 25 MG tablet Commonly known as: LOPRESSOR       TAKE these medications    aspirin 81 MG chewable tablet Chew 81 mg by mouth daily.   atorvastatin 80 MG tablet Commonly known as: LIPITOR Take 80 mg by mouth daily.   calcium-vitamin D 500-200 MG-UNIT tablet Commonly known as: OSCAL WITH D Take 1 tablet by mouth 2 (two) times daily.   furosemide 40 MG tablet Commonly known as: LASIX Take 0.5 tablets (20 mg total) by mouth daily as needed. For 3 lb weight gain. What changed:  how much to take when to take this reasons to take this additional instructions   Lantus SoloStar 100 UNIT/ML Solostar Pen Generic drug: insulin glargine Inject 15 Units into the skin at bedtime.   losartan 25 MG tablet Commonly known as: COZAAR Take 1 tablet (25 mg total) by mouth daily. Start taking on: April 08, 2023   metFORMIN 1000 MG tablet Commonly known as: GLUCOPHAGE Take 1,000 mg by mouth 2 (two) times daily with a meal.   metoprolol succinate 50 MG 24 hr tablet Commonly known as: TOPROL-XL Take 1 tablet (50 mg total) by mouth daily. Take with or immediately following a meal. Start taking on: April 08, 2023   multivitamin with minerals Tabs tablet Take 1 tablet by mouth daily. Start taking on: April 08, 2023    spironolactone 25 MG tablet Commonly known as: ALDACTONE Take 0.5 tablets (12.5 mg total) by mouth daily. Start taking on: April 08, 2023         Follow-up Information     [facility] Follow up.   Why: They will follow up with you for your home health therapy needs.        Kolluru, Sarath, MD Follow up in 1 week(s).   Specialty: Nephrology Contact information: [address] [place] [address] [phone]         Antonieta Iba, MD Follow up in 1 week(s).   Specialty: Cardiology Contact information: [address] [place] [address] [phone]                 Allergies  Allergen Reactions   Jardiance [Empagliflozin]     Multiple side effect complications including: DKA, pancreatitis, renal failure. Avoid re-prescribing   Codeine Nausea And Vomiting  Ibuprofen Nausea And Vomiting     The results of significant diagnostics from this hospitalization (including imaging, microbiology, ancillary and laboratory) are listed below for reference.   Consultations:   Procedures/Studies: CARDIAC CATHETERIZATION  Result Date: 04/06/2023 Conclusions: Severe single-vessel coronary artery disease with occlusion of distal rPLA and collateral filling of distal rPL branch.  There is mild, nonobstructive disease involving the proximal LAD and proximal RCA. Normal left and right heart filling pressures. Mild pulmonary hypertension. Normal Fick CO/CI.  Recommendations: Secondary mention of coronary artery disease, including indefinite aspirin as well as high intensity statin therapy.  Occlusion of distal R PLA is likely subacute to chronic given collateralization.  Lesion is too small and distal for PCI. Escalate all directed medical therapy for nonischemic cardiomyopathy, as systolic dysfunction is out of proportion to coronary artery disease.  If renal function allows, consider addition of ARB tomorrow. Maintain net even  fluid balance; will hydrate gently postcatheterization in the setting of resolving acute kidney injury. Yvonne Kendall, MD [facility]  CT ABDOMEN PELVIS WO CONTRAST  Result Date: 04/01/2023 CLINICAL DATA:  Right lower quadrant pain, elevated lipase, sepsis EXAM: CT ABDOMEN AND PELVIS WITHOUT CONTRAST TECHNIQUE: Multidetector CT imaging of the abdomen and pelvis was performed following the standard protocol without IV contrast. RADIATION DOSE REDUCTION: This exam was performed according to the departmental dose-optimization program which includes automated exposure control, adjustment of the mA and/or kV according to patient size and/or use of iterative reconstruction technique. COMPARISON:  03/31/2023 FINDINGS: Lower chest: Cardiomegaly. Trace bilateral pleural effusions. Bibasilar airspace opacities likely reflect atelectasis. 1.4 cm right lower lobe nodule compared to 1.6 cm previously. Hepatobiliary: No focal hepatic abnormality. Gallbladder unremarkable. Pancreas: No focal abnormality or ductal dilatation. Spleen: No focal abnormality.  Normal size. Adrenals/Urinary Tract: Adrenal glands normal. No renal or adrenal stones. No hydronephrosis. Scattered low-density lesions within the kidneys most compatible with cysts. No follow-up imaging recommended. Foley catheter within the bladder, carcinoma. Stomach/Bowel: Stomach, large and small bowel grossly unremarkable. Umbilical hernia containing small bowel loops. No bowel obstruction. Vascular/Lymphatic: Aortic atherosclerosis. No evidence of aneurysm or adenopathy. Reproductive: Prior hysterectomy.  No adnexal masses. Other: Trace free fluid in the cul-de-sac.  No free air. Musculoskeletal: No acute bony abnormality. IMPRESSION: Trace bilateral pleural effusions. 1.4 cm right lower lobe pulmonary nodule, stable since prior study. This nodule has been present dating back to 2012 and was not avid on FDG imaging. No acute findings in the abdomen or pelvis.  Umbilical hernia containing small bowel loops. No bowel obstruction. Aortic atherosclerosis. Electronically Signed   By: Charlett Nose M.D.   On: 04/01/2023 18:12   ECHOCARDIOGRAM COMPLETE  Result Date: 04/01/2023    ECHOCARDIOGRAM REPORT   Patient Name:   Rhetta Kicklighter Date of Exam: 04/01/2023 Medical Rec #:  [id number]   Height:       61.0 in Accession #:    [id number]  Weight:       187.2 lb Date of Birth:  [birth year]   BSA:          1.836 m Patient Age:    56 years    BP:           86/52 mmHg Patient Gender: F           HR:           92 bpm. Exam Location:  [facility] Procedure: 2D Echo, 3D Echo, Cardiac Doppler, Color Doppler and Intracardiac  Opacification Agent Indications:     Abnormal ECG  History:         Patient has no prior history of Echocardiogram examinations.                  Abnormal ECG; Risk Factors:Diabetes. CKD, Sepsis.  Sonographer:     Mikki Harbor Referring Phys:  [id number] Ezequiel Essex Diagnosing Phys: [name] MD  Sonographer Comments: Technically difficult study due to poor echo windows. IMPRESSIONS  1. Left ventricular ejection fraction, by estimation, is 30 to 35%. Left ventricular ejection fraction by 2D MOD biplane is 38.4 %. Left ventricular ejection fraction by PLAX is 29 %. The left ventricle has moderately decreased function. The left ventricle demonstrates global hypokinesis. Left ventricular diastolic parameters are consistent with Grade I diastolic dysfunction (impaired relaxation).  2. Right ventricular systolic function is moderately reduced. The right ventricular size is moderately enlarged. There is mildly elevated pulmonary artery systolic pressure. The estimated right ventricular systolic pressure is 42.3 mmHg.  3. The mitral valve is normal in structure. Mild mitral valve regurgitation. No evidence of mitral stenosis.  4. Tricuspid valve regurgitation is mild to moderate.  5. The aortic valve is tricuspid. Aortic valve regurgitation is not visualized. No  aortic stenosis is present.  6. The inferior vena cava is normal in size with <50% respiratory variability, suggesting right atrial pressure of 8 mmHg. FINDINGS  Left Ventricle: Left ventricular ejection fraction, by estimation, is 30 to 35%. Left ventricular ejection fraction by PLAX is 29 %. Left ventricular ejection fraction by 2D MOD biplane is 38.4 %. The left ventricle has moderately decreased function. The left ventricle demonstrates global hypokinesis. Definity contrast agent was given IV to delineate the left ventricular endocardial borders. The left ventricular internal cavity size was normal in size. There is no left ventricular hypertrophy. Left ventricular diastolic parameters are consistent with Grade I diastolic dysfunction (impaired relaxation). Right Ventricle: The right ventricular size is moderately enlarged. No increase in right ventricular wall thickness. Right ventricular systolic function is moderately reduced. There is mildly elevated pulmonary artery systolic pressure. The tricuspid regurgitant velocity is 2.84 m/s, and with an assumed right atrial pressure of 10 mmHg, the estimated right ventricular systolic pressure is 42.3 mmHg. Left Atrium: Left atrial size was normal in size. Right Atrium: Right atrial size was normal in size. Pericardium: There is no evidence of pericardial effusion. Mitral Valve: The mitral valve is normal in structure. There is mild calcification of the mitral valve leaflet(s). Mild mitral valve regurgitation. No evidence of mitral valve stenosis. MV peak gradient, 3.0 mmHg. The mean mitral valve gradient is 2.0 mmHg. Tricuspid Valve: The tricuspid valve is normal in structure. Tricuspid valve regurgitation is mild to moderate. No evidence of tricuspid stenosis. Aortic Valve: The aortic valve is tricuspid. Aortic valve regurgitation is not visualized. No aortic stenosis is present. Aortic valve mean gradient measures 6.0 mmHg. Aortic valve peak gradient measures 10.6  mmHg. Aortic valve area, by VTI measures 1.95  cm. Pulmonic Valve: The pulmonic valve was normal in structure. Pulmonic valve regurgitation is not visualized. No evidence of pulmonic stenosis. Aorta: The aortic root is normal in size and structure. Venous: The inferior vena cava is normal in size with less than 50% respiratory variability, suggesting right atrial pressure of 8 mmHg. IAS/Shunts: No atrial level shunt detected by color flow Doppler.  LEFT VENTRICLE PLAX 2D  Biplane EF (MOD) LV EF:         Left            LV Biplane EF:   Left                ventricular                      ventricular                ejection                         ejection                fraction by                      fraction by                PLAX is 29                       2D MOD                %.                               biplane is LVIDd:         4.50 cm                          38.4 %. LVIDs:         3.90 cm LV PW:         1.00 cm         Diastology LV IVS:        1.00 cm         LV e' medial:    8.70 cm/s LVOT diam:     1.90 cm         LV E/e' medial:  8.4 LV SV:         57              LV e' lateral:   10.60 cm/s LV SV Index:   31              LV E/e' lateral: 6.9 LVOT Area:     2.84 cm  LV Volumes (MOD) LV vol d, MOD    67.9 ml A2C: LV vol d, MOD    61.6 ml A4C: LV vol s, MOD    42.0 ml A2C: LV vol s, MOD    38.0 ml A4C: LV SV MOD A2C:   25.9 ml LV SV MOD A4C:   61.6 ml LV SV MOD BP:    25.2 ml RIGHT VENTRICLE RV Basal diam:  3.45 cm RV Mid diam:    2.80 cm RV S prime:     15.20 cm/s TAPSE (M-mode): 1.6 cm LEFT ATRIUM             Index        RIGHT ATRIUM           Index LA diam:        3.70 cm 2.01 cm/m   RA Area:     23.70 cm LA Vol (A2C):   46.2 ml 25.16 ml/m  RA Volume:   75.80 ml  41.28 ml/m LA Vol (A4C):  36.1 ml 19.66 ml/m LA Biplane Vol: 42.9 ml 23.36 ml/m  AORTIC VALVE                     PULMONIC VALVE AV Area (Vmax):    1.76 cm      PV Vmax:       1.13 m/s AV Area (Vmean):    1.64 cm      PV Peak grad:  5.1 mmHg AV Area (VTI):     1.95 cm AV Vmax:           163.00 cm/s AV Vmean:          117.000 cm/s AV VTI:            0.291 m AV Peak Grad:      10.6 mmHg AV Mean Grad:      6.0 mmHg LVOT Vmax:         101.00 cm/s LVOT Vmean:        67.700 cm/s LVOT VTI:          0.200 m LVOT/AV VTI ratio: 0.69  AORTA Ao Root diam: 3.20 cm MITRAL VALVE               TRICUSPID VALVE MV Area (PHT): 5.38 cm    TR Peak grad:   32.3 mmHg MV Area VTI:   2.67 cm    TR Vmax:        284.00 cm/s MV Peak grad:  3.0 mmHg MV Mean grad:  2.0 mmHg    SHUNTS MV Vmax:       0.87 m/s    Systemic VTI:  0.20 m MV Vmean:      63.8 cm/s   Systemic Diam: 1.90 cm MV Decel Time: 141 msec MV E velocity: 73.40 cm/s MV A velocity: 87.00 cm/s MV E/A ratio:  0.84 [name] MD Electronically signed by [name] MD Signature Date/Time: 04/01/2023/1:49:48 PM    Final    CT HEAD WO CONTRAST ( )  Result Date: 03/31/2023 CLINICAL DATA:  Mental status change, unknown cause. EXAM: CT HEAD WITHOUT CONTRAST TECHNIQUE: Contiguous axial images were obtained from the base of the skull through the vertex without intravenous contrast. RADIATION DOSE REDUCTION: This exam was performed according to the departmental dose-optimization program which includes automated exposure control, adjustment of the mA and/or kV according to patient size and/or use of iterative reconstruction technique. COMPARISON:  CT scan head report from 03/01/2019. Images were not available for review. FINDINGS: Brain: No evidence of acute infarction, hemorrhage, hydrocephalus, extra-axial collection or mass lesion/mass effect. Areas of encephalomalacia noted involving the left external capsule/left basal ganglia with associated ex vacuo dilation of frontal horn of left lateral ventricle. There is additional small lacunar infarction in the right parietal lobe. There is bilateral periventricular hypodensity, which is non-specific but most likely seen in the  settings of microvascular ischemic changes. Mild in extent. Vascular: No hyperdense vessel or unexpected calcification. Intracranial arteriosclerosis. Skull: Normal. Negative for fracture or focal lesion. Sinuses/Orbits: No acute finding. Other: Bilateral mastoid air cells are clear. IMPRESSION: 1. No acute intracranial abnormality. 2. 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 3. Mild microvascular ischemic changes. Electronically Signed   By: Jules Schick M.D.   On: 03/31/2023 15:12   CT ABDOMEN PELVIS WO CONTRAST  Result Date: 03/31/2023 CLINICAL DATA:  Abdominal pain, acute, nonlocalized. History of gynecological carcinoma. * Tracking Code: BO * EXAM: CT ABDOMEN AND PELVIS WITHOUT CONTRAST TECHNIQUE: Multidetector  CT imaging of the abdomen and pelvis was performed following the standard protocol without IV contrast. RADIATION DOSE REDUCTION: This exam was performed according to the departmental dose-optimization program which includes automated exposure control, adjustment of the mA and/or kV according to patient size and/or use of iterative reconstruction technique. COMPARISON:  CT scan abdomen and pelvis from 09/23/2010 and PET-CT scan from 01/16/2020. FINDINGS: Examination is limited due to lack of oral/intravenous contrast and patient's motion during data acquisition. Lower chest: Redemonstration of a well-circumscribed 1 point 4 x 1.6 cm solid noncalcified nodule in the right lung lower lobe. The nodule is present since the prior study dating back to February 2012 and exhibits mild interval growth. The nodule was not FDG avid on the prior PET-CT scan. The lung bases are otherwise clear. No pleural effusion. The heart is normal in size. No pericardial effusion. Hepatobiliary: The liver is normal in size. Non-cirrhotic configuration. No suspicious mass. No intrahepatic or extrahepatic bile duct dilation. There are layering hyperattenuating areas in the gallbladder,  which may represent concentrated bile versus sludge. Normal gallbladder wall thickness. No pericholecystic inflammatory changes. Pancreas: Unremarkable. No pancreatic ductal dilatation or surrounding inflammatory changes. Spleen: Within normal limits. No focal lesion. Adrenals/Urinary Tract: Adrenal glands are unremarkable. No suspicious renal mass. There is a partially exophytic 1.5 x 1.6 cm cyst arising from the right kidney interpolar region, laterally. No hydronephrosis. No renal or ureteric calculi. Urinary bladder is under distended secondary to Foley catheter, precluding optimal assessment. However, no large mass or stones identified. No perivesical fat stranding. Stomach/Bowel: No disproportionate dilation of the small or large bowel loops. No evidence of abnormal bowel wall thickening or inflammatory changes. The appendix is unremarkable. Vascular/Lymphatic: No ascites or pneumoperitoneum. No abdominal or pelvic lymphadenopathy, by size criteria. No aneurysmal dilation of the major abdominal arteries. There are mild peripheral atherosclerotic vascular calcifications of the aorta and its major branches. Reproductive: The uterus is surgically absent. No large adnexal mass. Other: There is a small to medium periumbilical hernia containing fat and unobstructed distal small bowel loop. The soft tissues and abdominal wall are otherwise unremarkable. Musculoskeletal: No suspicious osseous lesions. There are mild - moderate multilevel degenerative changes in the visualized spine. IMPRESSION: 1. Suboptimal exam. 2. No acute inflammatory process identified within the abdomen or pelvis. No discrete metastatic disease identified. 3. Multiple other nonacute observations, as described above. Aortic Atherosclerosis (ICD10-I70.0). Electronically Signed   By: Jules Schick M.D.   On: 03/31/2023 15:04   DG Chest Portable 1 View  Result Date: 03/31/2023 CLINICAL DATA:  SOB EXAM: PORTABLE CHEST 1 VIEW COMPARISON:  CT  chest may 8, 21. FINDINGS: Right lower lobe pulmonary nodule. No consolidation. Enlarged cardiac silhouette. No visible pleural effusions or pneumothorax. Right proximal humerus fixation. IMPRESSION: 1. Right lower lobe pulmonary nodule, seen on prior CT chest. 2. No consolidation. Electronically Signed   By: Feliberto Harts M.D.   On: 03/31/2023 12:53      Labs: BNP (last 3 results) No results for input(s): "BNP" in the last 8760 hours. Basic Metabolic Panel: Recent Labs  Lab 04/01/23 0406 04/01/23 1033 04/01/23 1913 04/02/23 0420 04/03/23 0418 04/04/23 0406 04/05/23 0512 04/06/23 0644 04/06/23 1131 04/06/23 1133 04/07/23 0627  NA 138   < > 135 139 140 140 141 140 141 141 140  K 3.7   < > 3.9 3.8 3.4* 3.4* 3.4* 3.6 3.5 3.5 4.0  CL 98   < > 99 102 104 106 105 106  --   --  104  CO2 24   < > 23 24 26 26 25 27   --   --  26  GLUCOSE 132*   < > 151* 108* 99 106* 93 90  --   --  102*  BUN 36*   < > 26* 15 17 18 16 15   --   --  15  CREATININE 3.14*   < > 2.91* 1.92* 1.95* 1.91* 1.72* 1.45*  --   --  1.27*  CALCIUM 8.1*   < > 7.9* 7.9* 8.3* 8.3* 8.6* 8.8*  --   --  8.9  MG 2.5*  --   --  2.2 2.1 1.7 1.7 1.5*  --   --  1.8  PHOS 2.0*  --  2.8 1.9*  --  3.1  --   --   --   --  2.9   < > = values in this interval not displayed.   Liver Function Tests: Recent Labs  Lab 04/01/23 0406 04/01/23 1913  AST 26  --   ALT 21  --   ALKPHOS 38  --   BILITOT 1.1  --   PROT 5.8*  --   ALBUMIN 2.9* 2.8*   Recent Labs  Lab 03/31/23 1449 04/01/23 0406 04/01/23 1033  LIPASE  --  198*  --   AMYLASE 411*  --  517*   No results for input(s): "AMMONIA" in the last 168 hours. CBC: Recent Labs  Lab 04/02/23 0420 04/03/23 0418 04/04/23 0406 04/05/23 0512 04/06/23 0644 04/06/23 1131 04/06/23 1133 04/07/23 0627  WBC 7.1 7.5 7.5 6.5 7.6  --   --  7.4  NEUTROABS 4.8  --   --   --   --   --   --   --   HGB 11.5* 11.3* 12.8 13.0 13.3 12.6 12.6 12.3  HCT 33.9* 33.9* 38.4 37.9 38.6 37.0  37.0 37.5  MCV 84.3 86.3 84.8 83.8 82.8  --   --  85.6  PLT 126* 109* 140* 169 213  --   --  249   Cardiac Enzymes: No results for input(s): "CKTOTAL", "CKMB", "CKMBINDEX", "TROPONINI" in the last 168 hours. BNP: Invalid input(s): "POCBNP" CBG: Recent Labs  Lab 04/06/23 1207 04/06/23 1659 04/06/23 2110 04/07/23 0829 04/07/23 1123  GLUCAP 76 168* 122* 108* 176*   D-Dimer No results for input(s): "DDIMER" in the last 72 hours. Hgb A1c No results for input(s): "HGBA1C" in the last 72 hours. Lipid Profile No results for input(s): "CHOL", "HDL", "LDLCALC", "TRIG", "CHOLHDL", "LDLDIRECT" in the last 72 hours. Thyroid function studies No results for input(s): "TSH", "T4TOTAL", "T3FREE", "THYROIDAB" in the last 72 hours.  Invalid input(s): "FREET3" Anemia work up No results for input(s): "VITAMINB12", "FOLATE", "FERRITIN", "TIBC", "IRON", "RETICCTPCT" in the last 72 hours. Urinalysis    Component Value Date/Time   COLORURINE YELLOW (A) 03/31/2023 1005   APPEARANCEUR CLOUDY (A) 03/31/2023 1005   LABSPEC 1.017 03/31/2023 1005   PHURINE 5.0 03/31/2023 1005   GLUCOSEU 50 (A) 03/31/2023 1005   HGBUR MODERATE (A) 03/31/2023 1005   BILIRUBINUR NEGATIVE 03/31/2023 1005   KETONESUR 5 (A) 03/31/2023 1005   PROTEINUR >=300 (A) 03/31/2023 1005   NITRITE NEGATIVE 03/31/2023 1005   LEUKOCYTESUR SMALL (A) 03/31/2023 1005   Sepsis Labs Recent Labs  Lab 04/04/23 0406 04/05/23 0512 04/06/23 0644 04/07/23 0627  WBC 7.5 6.5 7.6 7.4   Microbiology Recent Results (from the past 240 hour(s))  Resp panel by RT-PCR (RSV, Flu A&B, Covid) Anterior Nasal Swab  Status: None   Collection Time: 03/31/23 10:06 AM   Specimen: Anterior Nasal Swab  Result Value Ref Range Status   SARS Coronavirus 2 by RT PCR NEGATIVE NEGATIVE Final    Comment: (NOTE) SARS-CoV-2 target nucleic acids are NOT DETECTED.  The SARS-CoV-2 RNA is generally detectable in upper respiratory specimens during the acute  phase of infection. The lowest concentration of SARS-CoV-2 viral copies this assay can detect is 138 copies/mL. A negative result does not preclude SARS-Cov-2 infection and should not be used as the sole basis for treatment or other patient management decisions. A negative result may occur with  improper specimen collection/handling, submission of specimen other than nasopharyngeal swab, presence of viral mutation(s) within the areas targeted by this assay, and inadequate number of viral copies(<138 copies/mL). A negative result must be combined with clinical observations, patient history, and epidemiological information. The expected result is Negative.  Fact Sheet for Patients:  BloggerCourse.com  Fact Sheet for Healthcare Providers:  SeriousBroker.it  This test is no t yet approved or cleared by the [place] FDA and  has been authorized for detection and/or diagnosis of SARS-CoV-2 by FDA under an Emergency Use Authorization (EUA). This EUA will remain  in effect (meaning this test can be used) for the duration of the COVID-19 declaration under Section 564(b)(1) of the Act, 21 U.S.C.section 360bbb-3(b)(1), unless the authorization is terminated  or revoked sooner.       Influenza A by PCR NEGATIVE NEGATIVE Final   Influenza B by PCR NEGATIVE NEGATIVE Final    Comment: (NOTE) The Xpert Xpress SARS-CoV-2/FLU/RSV plus assay is intended as an aid in the diagnosis of influenza from Nasopharyngeal swab specimens and should not be used as a sole basis for treatment. Nasal washings and aspirates are unacceptable for Xpert Xpress SARS-CoV-2/FLU/RSV testing.  Fact Sheet for Patients: BloggerCourse.com  Fact Sheet for Healthcare Providers: SeriousBroker.it  This test is not yet approved or cleared by the [place] FDA and has been authorized for detection and/or diagnosis of  SARS-CoV-2 by FDA under an Emergency Use Authorization (EUA). This EUA will remain in effect (meaning this test can be used) for the duration of the COVID-19 declaration under Section 564(b)(1) of the Act, 21 U.S.C. section 360bbb-3(b)(1), unless the authorization is terminated or revoked.     Resp Syncytial Virus by PCR NEGATIVE NEGATIVE Final    Comment: (NOTE) Fact Sheet for Patients: BloggerCourse.com  Fact Sheet for Healthcare Providers: SeriousBroker.it  This test is not yet approved or cleared by the [place] FDA and has been authorized for detection and/or diagnosis of SARS-CoV-2 by FDA under an Emergency Use Authorization (EUA). This EUA will remain in effect (meaning this test can be used) for the duration of the COVID-19 declaration under Section 564(b)(1) of the Act, 21 U.S.C. section 360bbb-3(b)(1), unless the authorization is terminated or revoked.  Performed at [facility], [address] [place], [place] [address]   MRSA Next Gen by PCR, Nasal     Status: Abnormal   Collection Time: 03/31/23  2:23 PM   Specimen: Nasal Mucosa; Nasal Swab  Result Value Ref Range Status   MRSA by PCR Next Gen DETECTED (A) NOT DETECTED Final    Comment: RESULT CALLED TO, READ BACK BY AND VERIFIED WITH: CHARLIE CORN 03/31/23 1546 KLW (NOTE) The GeneXpert MRSA Assay (FDA approved for NASAL specimens only), is one component of a comprehensive MRSA colonization surveillance program. It is not intended to diagnose MRSA infection nor to guide or  monitor treatment for MRSA infections. Test performance is not FDA approved in patients less than 96 years old. Performed at [facility], [address] [place], [place] [address]   Blood culture (routine x 2)     Status: None   Collection Time: 03/31/23  2:49 PM   Specimen: BLOOD  Result Value Ref Range Status   Specimen Description BLOOD LEFT ANTECUBITAL  Final    Special Requests   Final    BOTTLES DRAWN AEROBIC AND ANAEROBIC Blood Culture results may not be optimal due to an inadequate volume of blood received in culture bottles   Culture   Final    NO GROWTH 5 DAYS Performed at [facility], [address] [place], [place] [address]    Report Status 04/05/2023 FINAL  Final  Blood culture (routine x 2)     Status: None   Collection Time: 03/31/23  3:01 PM   Specimen: BLOOD  Result Value Ref Range Status   Specimen Description BLOOD BLOOD LEFT HAND  Final   Special Requests   Final    BOTTLES DRAWN AEROBIC ONLY Blood Culture results may not be optimal due to an inadequate volume of blood received in culture bottles   Culture   Final    NO GROWTH 5 DAYS Performed at [facility], [address], [place], [place] [address]    Report Status 04/05/2023 FINAL  Final  Urine Culture     Status: None   Collection Time: 03/31/23  5:04 PM   Specimen: Urine, Random  Result Value Ref Range Status   Specimen Description   Final    URINE, RANDOM Performed at [facility], [address], [place], [place] [address]    Special Requests   Final    NONE Performed at [facility], [address] [place], [place] [address]    Culture   Final    NO GROWTH Performed at [facility], [address], [place], [place] [address]    Report Status 04/02/2023 FINAL  Final     Total time spend on discharging this patient, including the last patient exam, discussing the hospital stay, instructions for ongoing care as it relates to all pertinent caregivers, as well as preparing the medical discharge records, prescriptions, and/or referrals as applicable, is 50 minutes.    Darlin Priestly, MD  [facility] 04/07/2023, 1:52 PM

## 2023-04-07 NOTE — Progress Notes (Signed)
Speech Language Pathology Treatment: Cognitive-Linquistic  Patient Details Name: Sarah Lane MRN: [id number] DOB: 1965/09/02 Today's Date: 04/07/2023 Time: 4098-1191 SLP Time Calculation (min) (ACUTE ONLY): 17 min  Assessment / Plan / Recommendation Clinical Impression  Pt seen for skilled SLP services targeting functional expressive communication. Pt alert, pleasant, and cooperative. Received seated upright in recliner. Pt conversing with family member via FaceTime upon SLP entrance to room. Pt endorsed marked improvement in ability to converse. "I getting better." Reviewed compensatory strategies for anomia including extra time and circumlocution. Pt able to utilize with min A during structured tasks and informal conversation. Pt continues to present with s/sx non-fluent aphasia with concomitant apraxia of speech. Recommend skilled SLP services acute and post-acute for above mentioned deficits.   HPI HPI: PT is a 57 yo female who She presented to [facility] ER on 08/21 via EMS with c/o nausea/vomiting/diarrhea/abdominal pain with inability to tolerate po's. Onset of symptoms the night of 08/20. Pt with a PMH of type II diabetes mellitus, seizures, stroke, HTN, dysrhythmia, and ovarian cancer. Head CT on admission, "1. No acute intracranial abnormality.  2. 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  3. Mild microvascular ischemic changes." CXR on admission, "1. Right lower lobe pulmonary nodule, seen on prior CT chest.  2. No consolidation." Abdominal CT on admission, "1. Suboptimal exam.  2. No acute inflammatory process identified within the abdomen or  pelvis. No discrete metastatic disease identified.  3. Multiple other nonacute observations, as described above."      SLP Plan  Continue with current plan of care      Recommendations for follow up therapy are one component of a multi-disciplinary discharge planning process, led by the attending physician.   Recommendations may be updated based on patient status, additional functional criteria and insurance authorization.    Recommendations                         Frequent or constant Supervision/Assistance Aphasia (R47.01);Dysarthria and anarthria (R47.1);Apraxia (R48.2);Cognitive communication deficit (R41.841)     Continue with current plan of care    Clyde Canterbury, M.S., CCC-SLP Speech-Language Pathologist [facility] [phone] Arnette Felts)  Woodroe Chen  04/07/2023, 11:36 AM

## 2023-04-07 NOTE — Progress Notes (Signed)
Occupational Therapy Treatment Patient Details Name: Sarah Lane MRN: [id number] DOB: Oct 26, 1965 Today's Date: 04/07/2023   History of present illness Pt is a 57 year old female presenting to ED, admitted with euglycemic DKA suspect secondary to SGLT 2 requiring insulin gtt, acute metabolic encephalopathy, sepsis secondary to possible acute pancreatitis and/or UTI, severe lactic/metabolic acidosis, and acute kidney injury    PMH significant for type II diabetes mellitus, seizures, HTN, dysrhythmia, and ovarian cancer, recent stroke in June 2024 in [place]   OT comments  Pt received seated in recliner; chair alarm off. Appearing alert; willing to work with OT on functional mobility; denies ADL needs at this time. T/f MOD (I); supervision/SBA with RW a lap around the unit, no loss of balance. See flowsheet below for further details of session. Left seated in recliner chair with all needs in reach.  Patient will benefit from continued OT while in acute care.       If plan is discharge home, recommend the following:  A little help with bathing/dressing/bathroom;Assistance with cooking/housework;Direct supervision/assist for medications management;Direct supervision/assist for financial management;Assist for transportation;Help with stairs or ramp for entrance   Equipment Recommendations  None recommended by OT    Recommendations for Other Services      Precautions / Restrictions Precautions Precautions: Fall Restrictions Weight Bearing Restrictions: No       Mobility Bed Mobility               General bed mobility comments: Not tested; pt seated in chair at beginning and end of session    Transfers Overall transfer level: Modified independent Equipment used: Rolling walker (2 wheels) Transfers: Sit to/from Stand Sit to Stand: Modified independent (Device/Increase time)                 Balance Overall balance assessment: Needs assistance Sitting-balance  support: Feet supported Sitting balance-Leahy Scale: Good     Standing balance support: Reliant on assistive device for balance Standing balance-Leahy Scale: Good                             ADL either performed or assessed with clinical judgement   ADL                                       Functional mobility during ADLs: Contact guard assist;Supervision/safety;Rolling walker (2 wheels) (Pt walked a lap around the unit without loss of balance. HR 95 during walk.) General ADL Comments: Pt likely at overall supervision level for safety. Pt refusing ADLs during session today; is able to demonstrate figure four position in seated today.    Extremity/Trunk Assessment Upper Extremity Assessment Upper Extremity Assessment: Overall WFL for tasks assessed   Lower Extremity Assessment Lower Extremity Assessment: Defer to PT evaluation        Vision       Perception     Praxis      Cognition Arousal: Alert Behavior During Therapy: [facility] for tasks assessed/performed Overall Cognitive Status: Within Functional Limits for tasks assessed                                 General Comments: Followed directions well; is aware that she may d/c today.        Exercises  Shoulder Instructions       General Comments Pt with no reports of dizziness or pain today; walked a lap around the unit. OT education provided on keeping BIL hands on walker during mobility at home; pt reporting she has a rollator and can keep items needed in rollator basket.    Pertinent Vitals/ Pain       Pain Assessment Pain Assessment: No/denies pain  Home Living                                          Prior Functioning/Environment              Frequency           Progress Toward Goals  OT Goals(current goals can now be found in the care plan section)  Progress towards OT goals: Progressing toward goals  Acute Rehab OT  Goals Patient Stated Goal: Go home OT Goal Formulation: With patient Time For Goal Achievement: 04/17/23 Potential to Achieve Goals: Good ADL Goals Pt Will Perform Grooming: with modified independence Pt Will Perform Lower Body Dressing: with modified independence Pt Will Transfer to Toilet: with modified independence;ambulating Pt Will Perform Toileting - Clothing Manipulation and hygiene: with modified independence;sit to/from stand Pt/caregiver will Perform Home Exercise Program: Right Upper extremity;With written HEP provided  Plan      Co-evaluation                 AM-PAC OT "6 Clicks" Daily Activity     Outcome Measure   Help from another person eating meals?: None Help from another person taking care of personal grooming?: None Help from another person toileting, which includes using toliet, bedpan, or urinal?: A Little Help from another person bathing (including washing, rinsing, drying)?: A Little Help from another person to put on and taking off regular upper body clothing?: None Help from another person to put on and taking off regular lower body clothing?: None 6 Click Score: 22    End of Session Equipment Utilized During Treatment: Rolling walker (2 wheels);Gait belt  OT Visit Diagnosis: Other abnormalities of gait and mobility (R26.89);Hemiplegia and hemiparesis   Activity Tolerance Patient tolerated treatment well   Patient Left in chair;with call bell/phone within reach;Other (comment) (RN aware that chair alarm not on (alarm was off when OT arrived))   Nurse Communication Mobility status;Other (comment) (chair alarm off (as pt was found with chair alarm off))        Time: 1610-9604 OT Time Calculation (min): 10 min  Charges: OT General Charges $OT Visit: 1 Visit OT Treatments $Therapeutic Activity: 8-22 mins  Linward Foster, MS, OTR/L   Alvester Morin 04/07/2023, 1:42 PM

## 2023-04-08 LAB — LIPOPROTEIN A (LPA): Lipoprotein (a): <8.4 nmol/L (ref <75.0)

## 2023-04-14 DIAGNOSIS — N1831 Chronic kidney disease, stage 3a: Secondary | ICD-10-CM | POA: Diagnosis not present

## 2023-04-14 DIAGNOSIS — N179 Acute kidney failure, unspecified: Secondary | ICD-10-CM | POA: Diagnosis not present

## 2023-04-14 DIAGNOSIS — I10 Essential (primary) hypertension: Secondary | ICD-10-CM | POA: Diagnosis not present

## 2023-04-14 DIAGNOSIS — E1122 Type 2 diabetes mellitus with diabetic chronic kidney disease: Secondary | ICD-10-CM | POA: Diagnosis not present

## 2023-04-14 DIAGNOSIS — R809 Proteinuria, unspecified: Secondary | ICD-10-CM | POA: Diagnosis not present

## 2023-04-16 NOTE — Progress Notes (Unsigned)
Cardiology Office Note  Date:  04/19/2023   ID:  Sarah Lane, DOB [birth year], MRN [id number]  PCP:  Care, [facility]   Chief Complaint  Patient presents with   Follow up [facility] s/p cardiac cath     Patient c/o right leg pain with walking & shortness of breath with little exertion. Medications reviewed by the patient verbally.     HPI:  Sarah Lane is a 57 y.o. female with a history of recent stroke,  DM,  has been poorly controlled since diagnosis.  recently dx cardiomyopathy & HFrEF (EF 15-20% by [facility] echo 01/2023),  HTN,  PSVT,  remote seizures,   RLL pulm nodule,  Severe single-vessel coronary artery disease with occlusion of distal rPLA and collateral filling of distal rPL branch  8/24:  Left ventricular ejection fraction, by estimation, is 30 to 35%  who is being seen today for the evaluation of cardiomyopathy   By cardiology in the hospital, first time presenting to the clinic Presents in a with chair with her friend Sarah Lane  Recent events reviewed with her in detail admitted to [facility] in 01/26/2023 in the setting of L MCA occlusion and stroke, which was treated w/ thrombectomy.  Following thrombectomy, she had ongoing expressive aphasia.  She also required treatment for UTI.    Echo showed LV dysfxn w/ an EF of 15-20% and impaired relaxation, mildly to mod dil RV w/ mild RV dysfxn, mod PH, and bi-atrial enlargement.  seen by cardiology and placed on jardiance 10 mg daily, metoprolol 12.5 mg BID, Lasix 40 BID, asa 81 daily, and atorvastatin 80 daily.  After prolonged hospitalization, she was discharged home on 02/20/2023 (refused SNF).     On the evening of 8/20, she began experiencing nausea, vomiting, diarrhea, and abd pain.  She notes that her appetite had been poor leading up to this and that her blood sugars had been running high.  Due to ongoing symptoms, she called EMS and was found to be hypoglycemic in the 30's.  She was treated w/ D10W bolus w/ improvement in CBG to 86.   She was taken to the [facility] ED where, she was initially confused, weak, and dyspneic.  Labs notable for WBC 15.0, H/H 17.3/57.9, BUN/Creat 73/7.31 (21/0.76 respectively on 02/19/2023), gluc 152, lipase 240, AST 52, ALT 35, lactic acid >9.0, HsTrop 213  295  495  475.  ECG initially showed SVT @ 160, though this later converted to sinus tach/sinus rhythm late in the evening on 8/21.   CT Abd/Pelvis showed no acute findings (Ao atherosclerosis noted).   CT head showed old L ext capsul/L basal ganglia and R parietal lobe infarcts w/o acute findings.   admitted and was managed for euglycemic DKA felt to be 2/2 SGLT2i, metabolic encephalopathy, sepsis 2/2 acute pancreatitis, severe lactic/metabolic acidosis, and AKI.  She was seen by nephrology and initially required CRRT.  Creat has since trended down - 1.92 this AM.  Lactic acid now nl @ 1.6.   echo showing ongoing LV dysfxn w/ EF of 30-35% on 8/22.    On further discussions in the office, she reports overall continues to improve following stroke Still working on balance Lives with Sarah Lane Both go shopping, uses buggy She is nervous to be left alone when Sarah Lane leaves but reports able to manage on her own for short periods of time  Sarah Lane concerned about her anorexia Weight dropping, down 20 pounds in 4 months "Trouble chewing and swallowing" Takes lasix 40 daily Not  on spironolactone Blood pressure low but denies orthostasis symptoms Reports that she got in a fight with Sarah Lane today about being left alone, that would explain her elevated heart rate  EKG personally reviewed by myself on todays visit EKG Interpretation Date/Time:  Monday April 19 2023 15:17:03 EDT Ventricular Rate:  110 PR Interval:  130 QRS Duration:  78 QT Interval:  342 QTC Calculation: 462 R Axis:   -35  Text Interpretation: Sinus tachycardia Left axis deviation Inferior infarct , age undetermined Possible Anterolateral infarct , age undetermined When  compared with ECG of 02-Apr-2023 05:14, No significant change was found Confirmed by Sarah Lane [phone]) on 04/19/2023 3:25:52 PM    PMH:   has a past medical history of Aortic atherosclerosis (HCC), Asthma, Cancer (HCC), Cardiomyopathy (HCC), Chronic HFrEF (heart failure with reduced ejection fraction) (HCC), Diabetes (HCC), Diabetes mellitus without complication (HCC), Expressive aphasia, Family history of adverse reaction to anesthesia, H/O ischemic left MCA stroke, Hypertension, PSVT (paroxysmal supraventricular tachycardia), Right lower lobe pulmonary nodule, and Seizures (HCC).  PSH:    Past Surgical History:  Procedure Laterality Date   ABDOMINAL HYSTERECTOMY  2011   ovarian cancer   IRRIGATION AND DEBRIDEMENT ABDOMEN N/A 12/31/2014   Procedure: IRRIGATION AND DEBRIDEMENT ABDOMINAL WALL ABSCESS;  Surgeon: Sarah Murphy, MD;  Location: [facility] ORS;  Service: General;  Laterality: N/A;   RIGHT/LEFT HEART CATH AND CORONARY ANGIOGRAPHY N/A 04/06/2023   Procedure: RIGHT/LEFT HEART CATH AND CORONARY ANGIOGRAPHY;  Surgeon: Sarah Kendall, MD;  Location: [facility] INVASIVE CV LAB;  Service: Cardiovascular;  Laterality: N/A;    Current Outpatient Medications  Medication Sig Dispense Refill   aspirin 81 MG chewable tablet Chew 81 mg by mouth daily.     atorvastatin (LIPITOR) 80 MG tablet Take 80 mg by mouth daily.     calcium-vitamin D (OSCAL WITH D) 500-200 MG-UNIT per tablet Take 1 tablet by mouth 2 (two) times daily.     LANTUS SOLOSTAR 100 UNIT/ML Solostar Pen Inject 15 Units into the skin at bedtime.     metFORMIN (GLUCOPHAGE) 1000 MG tablet Take 1,000 mg by mouth 2 (two) times daily with a meal.     Multiple Vitamin (MULTIVITAMIN WITH MINERALS) TABS tablet Take 1 tablet by mouth daily.     furosemide (LASIX) 40 MG tablet Take 1 tablet (40 mg total) by mouth daily. 90 tablet 3   losartan (COZAAR) 25 MG tablet Take 1 tablet (25 mg total) by mouth daily. 90 tablet 3   metoprolol succinate  (TOPROL-XL) 50 MG 24 hr tablet Take 1 tablet (50 mg total) by mouth daily. Take with or immediately following a meal. 90 tablet 3   No current facility-administered medications for this visit.    Allergies:   Jardiance [empagliflozin], Codeine, and Ibuprofen   Social History:  The patient  reports that she has never smoked. She does not have any smokeless tobacco history on file. She reports that she does not drink alcohol and does not use drugs.   Family History:   family history includes CAD in her father; Parkinson's disease in her father.    Review of Systems: Review of Systems  Constitutional: Negative.   HENT: Negative.    Respiratory: Negative.    Cardiovascular: Negative.   Gastrointestinal: Negative.   Musculoskeletal: Negative.        Leg weakness  Neurological: Negative.   Psychiatric/Behavioral: Negative.    All other systems reviewed and are negative.    PHYSICAL EXAM: VS:  BP 90/60 (BP Location:  Left Arm, Patient Position: Sitting, Cuff Size: Normal)   Pulse (!) 110   Ht 5' (1.524 m)   Wt 166 lb 8 oz (75.5 kg)   SpO2 96%   BMI 32.52 kg/m  , BMI Body mass index is 32.52 kg/m. GEN: Well nourished, well developed, in no acute distress HEENT: normal Neck: no JVD, carotid bruits, or masses Cardiac: RRR; no murmurs, rubs, or gallops,no edema  Respiratory:  clear to auscultation bilaterally, normal work of breathing GI: soft, nontender, nondistended, + BS MS: no deformity or atrophy Skin: warm and dry, no rash Neuro:  Strength and sensation are intact Psych: euthymic mood, full affect  Recent Labs: 03/31/2023: TSH 0.924 04/01/2023: ALT 21 04/07/2023: BUN 15; Creatinine, Ser 1.27; Hemoglobin 12.3; Magnesium 1.8; Platelets 249; Potassium 4.0; Sodium 140    Lipid Panel No results found for: "CHOL", "HDL", "LDLCALC", "TRIG"    Wt Readings from Last 3 Encounters:  04/19/23 166 lb 8 oz (75.5 kg)  04/07/23 177 lb 11.2 oz (80.6 kg)  12/31/14 186 lb 6.4 oz (84.6  kg)      ASSESSMENT AND PLAN:  Problem List Items Addressed This Visit       Cardiology Problems   Chronic HFrEF (heart failure with reduced ejection fraction) (HCC) - Primary   Relevant Medications   furosemide (LASIX) 40 MG tablet   metoprolol succinate (TOPROL-XL) 50 MG 24 hr tablet   losartan (COZAAR) 25 MG tablet   Other Relevant Orders   EKG 12-Lead (Completed)   Demand ischemia   Relevant Medications   furosemide (LASIX) 40 MG tablet   metoprolol succinate (TOPROL-XL) 50 MG 24 hr tablet   losartan (COZAAR) 25 MG tablet   Other Relevant Orders   EKG 12-Lead (Completed)     Other   High anion gap metabolic acidosis   Acute encephalopathy   Other Visit Diagnoses     Chronic renal failure (CRF), stage 3a (HCC)          Chronic systolic CHF Ejection fraction estimated 35%, nonischemic cardiomyopathy We have recommended she continue metoprolol succinate 50 daily, losartan 25 daily, Lasix 40 daily  Recent lab work showing creatinine 1.4 stable potassium 4.7 She reports she is not on spironolactone, we will hold off on reinitiating given low blood pressure, borderline elevated potassium  Chronic renal sufficiency Most recent creatinine 1.4 BUN 31 GFR 43 Numbers are down from August, creatinine at that time 1.9 with GFR 29 Continue medications as above  History of stroke Slow recovery, ambulating, uses a walker On aspirin, statin  Anorexia Recent weight loss over the past several months Stressed importance of not missing meals  Diabetes type 2 Managed by primary care, she reports sugars are running low Recommend she meet with primary care, medications may need to be adjusted in the setting of ongoing weight loss   Total encounter time more than 40 minutes  Greater than 50% was spent in counseling and coordination of care with the patient    Signed, Dossie Arbour, M.D., Ph.D. [facility] [place], [place] [phone]

## 2023-04-19 ENCOUNTER — Encounter: Payer: Self-pay | Admitting: Cardiovascular Disease

## 2023-04-19 ENCOUNTER — Ambulatory Visit: Payer: 59 | Attending: Cardiovascular Disease | Admitting: Cardiovascular Disease

## 2023-04-19 VITALS — BP 90/60 | HR 110 | Ht 60.0 in | Wt 166.5 lb

## 2023-04-19 DIAGNOSIS — N1831 Chronic kidney disease, stage 3a: Secondary | ICD-10-CM | POA: Diagnosis not present

## 2023-04-19 DIAGNOSIS — I2489 Other forms of acute ischemic heart disease: Secondary | ICD-10-CM | POA: Diagnosis not present

## 2023-04-19 DIAGNOSIS — G9340 Encephalopathy, unspecified: Secondary | ICD-10-CM | POA: Diagnosis not present

## 2023-04-19 DIAGNOSIS — I5022 Chronic systolic (congestive) heart failure: Secondary | ICD-10-CM | POA: Diagnosis not present

## 2023-04-19 DIAGNOSIS — E8729 Other acidosis: Secondary | ICD-10-CM | POA: Diagnosis not present

## 2023-04-19 DIAGNOSIS — N179 Acute kidney failure, unspecified: Secondary | ICD-10-CM

## 2023-04-19 MED ORDER — LOSARTAN POTASSIUM 25 MG PO TABS: 25.0000 mg | ORAL_TABLET | Freq: Every day | ORAL | 3 refills | Status: DC

## 2023-04-19 MED ORDER — METOPROLOL SUCCINATE ER 50 MG PO TB24: 50.0000 mg | ORAL_TABLET | Freq: Every day | ORAL | 3 refills | Status: DC

## 2023-04-19 MED ORDER — FUROSEMIDE 40 MG PO TABS: 40.0000 mg | ORAL_TABLET | Freq: Every day | ORAL | 3 refills | Status: DC

## 2023-04-19 NOTE — Patient Instructions (Addendum)
Medication Instructions:  Continue lasix 40 mg daily  If you need a refill on your cardiac medications before your next appointment, please call your pharmacy.   Lab work: No new labs needed  Testing/Procedures: No new testing needed  Follow-Up: At [facility], you and your health needs are our priority.  As part of our continuing mission to provide you with exceptional heart care, we have created designated Provider Care Teams.  These Care Teams include your primary Cardiologist (physician) and Advanced Practice Providers (APPs -  Physician Assistants and Nurse Practitioners) who all work together to provide you with the care you need, when you need it.  You will need a follow up appointment in 6 months  Providers on your designated Care Team:   Nicolasa Ducking, NP Eula Listen, PA-C Cadence Fransico [place], [place]  COVID-19 Vaccine Information can be found at: [email or web address] For questions related to vaccine distribution or appointments, please email [email or web address] or call 7031005462.

## 2023-04-20 ENCOUNTER — Other Ambulatory Visit: Payer: Self-pay | Admitting: *Deleted

## 2023-04-22 DIAGNOSIS — E118 Type 2 diabetes mellitus with unspecified complications: Secondary | ICD-10-CM | POA: Diagnosis not present

## 2023-04-22 DIAGNOSIS — Z09 Encounter for follow-up examination after completed treatment for conditions other than malignant neoplasm: Secondary | ICD-10-CM | POA: Diagnosis not present

## 2023-04-22 DIAGNOSIS — I639 Cerebral infarction, unspecified: Secondary | ICD-10-CM | POA: Diagnosis not present

## 2023-04-22 DIAGNOSIS — I10 Essential (primary) hypertension: Secondary | ICD-10-CM | POA: Diagnosis not present

## 2023-04-22 DIAGNOSIS — Z1231 Encounter for screening mammogram for malignant neoplasm of breast: Secondary | ICD-10-CM | POA: Diagnosis not present

## 2023-05-11 ENCOUNTER — Other Ambulatory Visit (HOSPITAL_COMMUNITY): Payer: Self-pay

## 2023-05-12 DIAGNOSIS — N1831 Chronic kidney disease, stage 3a: Secondary | ICD-10-CM | POA: Diagnosis not present

## 2023-05-12 DIAGNOSIS — R809 Proteinuria, unspecified: Secondary | ICD-10-CM | POA: Diagnosis not present

## 2023-05-12 DIAGNOSIS — N179 Acute kidney failure, unspecified: Secondary | ICD-10-CM | POA: Diagnosis not present

## 2023-05-12 DIAGNOSIS — E1122 Type 2 diabetes mellitus with diabetic chronic kidney disease: Secondary | ICD-10-CM | POA: Diagnosis not present

## 2023-06-01 ENCOUNTER — Emergency Department: Payer: 59

## 2023-06-01 ENCOUNTER — Inpatient Hospital Stay
Admission: EM | Admit: 2023-06-01 | Discharge: 2023-06-04 | DRG: 683 | Disposition: A | Discharge status: Home / Self Care | Payer: 59 | Attending: Internal Medicine | Admitting: Internal Medicine

## 2023-06-01 DIAGNOSIS — R112 Nausea with vomiting, unspecified: Secondary | ICD-10-CM | POA: Diagnosis not present

## 2023-06-01 DIAGNOSIS — I959 Hypotension, unspecified: Secondary | ICD-10-CM | POA: Diagnosis not present

## 2023-06-01 DIAGNOSIS — K8020 Calculus of gallbladder without cholecystitis without obstruction: Secondary | ICD-10-CM | POA: Diagnosis not present

## 2023-06-01 DIAGNOSIS — K828 Other specified diseases of gallbladder: Secondary | ICD-10-CM | POA: Diagnosis not present

## 2023-06-01 DIAGNOSIS — N390 Urinary tract infection, site not specified: Secondary | ICD-10-CM | POA: Diagnosis not present

## 2023-06-01 DIAGNOSIS — N179 Acute kidney failure, unspecified: Secondary | ICD-10-CM | POA: Diagnosis not present

## 2023-06-01 DIAGNOSIS — K838 Other specified diseases of biliary tract: Secondary | ICD-10-CM | POA: Diagnosis not present

## 2023-06-01 DIAGNOSIS — N281 Cyst of kidney, acquired: Secondary | ICD-10-CM | POA: Diagnosis not present

## 2023-06-01 DIAGNOSIS — R1011 Right upper quadrant pain: Secondary | ICD-10-CM | POA: Diagnosis not present

## 2023-06-01 DIAGNOSIS — K429 Umbilical hernia without obstruction or gangrene: Secondary | ICD-10-CM | POA: Diagnosis not present

## 2023-06-01 DIAGNOSIS — Z794 Long term (current) use of insulin: Secondary | ICD-10-CM

## 2023-06-01 DIAGNOSIS — Z5986 Financial insecurity: Secondary | ICD-10-CM

## 2023-06-01 DIAGNOSIS — Z79899 Other long term (current) drug therapy: Secondary | ICD-10-CM

## 2023-06-01 DIAGNOSIS — I130 Hypertensive heart and chronic kidney disease with heart failure and stage 1 through stage 4 chronic kidney disease, or unspecified chronic kidney disease: Secondary | ICD-10-CM | POA: Diagnosis present

## 2023-06-01 DIAGNOSIS — Z8543 Personal history of malignant neoplasm of ovary: Secondary | ICD-10-CM

## 2023-06-01 DIAGNOSIS — N1831 Chronic kidney disease, stage 3a: Secondary | ICD-10-CM | POA: Diagnosis present

## 2023-06-01 DIAGNOSIS — I429 Cardiomyopathy, unspecified: Secondary | ICD-10-CM | POA: Diagnosis present

## 2023-06-01 DIAGNOSIS — E119 Type 2 diabetes mellitus without complications: Secondary | ICD-10-CM

## 2023-06-01 DIAGNOSIS — I10 Essential (primary) hypertension: Secondary | ICD-10-CM | POA: Insufficient documentation

## 2023-06-01 DIAGNOSIS — Z90710 Acquired absence of both cervix and uterus: Secondary | ICD-10-CM

## 2023-06-01 DIAGNOSIS — E1122 Type 2 diabetes mellitus with diabetic chronic kidney disease: Secondary | ICD-10-CM | POA: Diagnosis present

## 2023-06-01 DIAGNOSIS — E785 Hyperlipidemia, unspecified: Secondary | ICD-10-CM | POA: Insufficient documentation

## 2023-06-01 DIAGNOSIS — Z7984 Long term (current) use of oral hypoglycemic drugs: Secondary | ICD-10-CM

## 2023-06-01 DIAGNOSIS — I5022 Chronic systolic (congestive) heart failure: Secondary | ICD-10-CM | POA: Diagnosis present

## 2023-06-01 DIAGNOSIS — I69351 Hemiplegia and hemiparesis following cerebral infarction affecting right dominant side: Secondary | ICD-10-CM

## 2023-06-01 DIAGNOSIS — Z8249 Family history of ischemic heart disease and other diseases of the circulatory system: Secondary | ICD-10-CM

## 2023-06-01 DIAGNOSIS — K529 Noninfective gastroenteritis and colitis, unspecified: Secondary | ICD-10-CM | POA: Diagnosis present

## 2023-06-01 DIAGNOSIS — Z7982 Long term (current) use of aspirin: Secondary | ICD-10-CM

## 2023-06-01 DIAGNOSIS — K2900 Acute gastritis without bleeding: Secondary | ICD-10-CM | POA: Diagnosis present

## 2023-06-01 DIAGNOSIS — E860 Dehydration: Secondary | ICD-10-CM | POA: Diagnosis present

## 2023-06-01 DIAGNOSIS — J45909 Unspecified asthma, uncomplicated: Secondary | ICD-10-CM | POA: Diagnosis present

## 2023-06-01 DIAGNOSIS — I69359 Hemiplegia and hemiparesis following cerebral infarction affecting unspecified side: Secondary | ICD-10-CM

## 2023-06-01 DIAGNOSIS — Z820 Family history of epilepsy and other diseases of the nervous system: Secondary | ICD-10-CM

## 2023-06-01 DIAGNOSIS — G40909 Epilepsy, unspecified, not intractable, without status epilepticus: Secondary | ICD-10-CM | POA: Diagnosis present

## 2023-06-01 DIAGNOSIS — R1111 Vomiting without nausea: Secondary | ICD-10-CM | POA: Diagnosis not present

## 2023-06-01 DIAGNOSIS — R1110 Vomiting, unspecified: Secondary | ICD-10-CM | POA: Diagnosis not present

## 2023-06-01 DIAGNOSIS — R110 Nausea: Secondary | ICD-10-CM | POA: Diagnosis not present

## 2023-06-01 DIAGNOSIS — R197 Diarrhea, unspecified: Secondary | ICD-10-CM | POA: Diagnosis not present

## 2023-06-01 LAB — COMPREHENSIVE METABOLIC PANEL
ALT: 13 U/L (ref 0–44)
AST: 19 U/L (ref 15–41)
Albumin: 4.8 g/dL (ref 3.5–5.0)
Alkaline Phosphatase: 49 U/L (ref 38–126)
Anion gap: 23 — ABNORMAL HIGH (ref 5–15)
BUN: 52 mg/dL — ABNORMAL HIGH (ref 6–20)
CO2: 21 mmol/L — ABNORMAL LOW (ref 22–32)
Calcium: 11.1 mg/dL — ABNORMAL HIGH (ref 8.9–10.3)
Chloride: 91 mmol/L — ABNORMAL LOW (ref 98–111)
Creatinine, Ser: 3.16 mg/dL — ABNORMAL HIGH (ref 0.44–1.00)
GFR, Estimated: 16 mL/min — ABNORMAL LOW (ref >60)
Glucose, Bld: 123 mg/dL — ABNORMAL HIGH (ref 70–99)
Potassium: 5.1 mmol/L (ref 3.5–5.1)
Sodium: 135 mmol/L (ref 135–145)
Total Bilirubin: 1.8 mg/dL — ABNORMAL HIGH (ref 0.3–1.2)
Total Protein: 8.4 g/dL — ABNORMAL HIGH (ref 6.5–8.1)

## 2023-06-01 LAB — URINALYSIS, ROUTINE W REFLEX MICROSCOPIC
Bilirubin Urine: NEGATIVE
Glucose, UA: NEGATIVE mg/dL
Hgb urine dipstick: NEGATIVE
Ketones, ur: 5 mg/dL — AB
Nitrite: NEGATIVE
Protein, ur: NEGATIVE mg/dL
Specific Gravity, Urine: 1.016 (ref 1.005–1.030)
pH: 5 (ref 5.0–8.0)

## 2023-06-01 LAB — CBC
HCT: 42.8 % (ref 36.0–46.0)
Hemoglobin: 14.3 g/dL (ref 12.0–15.0)
MCH: 29.1 pg (ref 26.0–34.0)
MCHC: 33.4 g/dL (ref 30.0–36.0)
MCV: 87.2 fL (ref 80.0–100.0)
Platelets: 273 10*3/uL (ref 150–400)
RBC: 4.91 MIL/uL (ref 3.87–5.11)
RDW: 13.2 % (ref 11.5–15.5)
WBC: 10.8 10*3/uL — ABNORMAL HIGH (ref 4.0–10.5)
nRBC: 0 % (ref 0.0–0.2)

## 2023-06-01 LAB — LACTIC ACID, PLASMA: Lactic Acid, Venous: 1.6 mmol/L (ref 0.5–1.9)

## 2023-06-01 LAB — LIPASE, BLOOD: Lipase: 132 U/L — ABNORMAL HIGH (ref 11–51)

## 2023-06-01 MED ORDER — IOHEXOL 9 MG/ML PO SOLN
500.0000 mL | ORAL | Status: AC
  Administered 2023-06-01 (×2): 500 mL via ORAL

## 2023-06-01 MED ORDER — LACTATED RINGERS IV BOLUS
1000.0000 mL | Freq: Once | INTRAVENOUS | Status: AC
  Administered 2023-06-01: 1000 mL via INTRAVENOUS

## 2023-06-01 MED ORDER — ONDANSETRON HCL 4 MG/2ML IJ SOLN
4.0000 mg | Freq: Once | INTRAMUSCULAR | Status: AC
  Administered 2023-06-01: 4 mg via INTRAVENOUS
  Filled 2023-06-01: qty 2

## 2023-06-01 NOTE — ED Triage Notes (Signed)
ARrives from home via [facility]>  C?O N/V x 4-5 days.  While in a car with a friend, felt nausous and vomited x 1 . Denies current complaint. VS wnl.

## 2023-06-01 NOTE — ED Triage Notes (Signed)
Pt presents to the ED from home via [facility]. Pt reports nausea and vomiting x5 days. Pt states that she had a stroke back in June and food has not tasted right ever since. Pt states that she doesn't eat well. 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 Pt A&Ox4. Pt lives with friend.

## 2023-06-01 NOTE — ED Provider Notes (Signed)
[facility] Provider Note    Event Date/Time   First MD Initiated Contact with Patient 06/01/23 2059     (approximate)   History   Nausea   HPI Sarah Lane is a 57 y.o. female with history of DM2, HFrEF, cardiomyopathy presenting today for nausea and vomiting.  Patient states she has had symptoms for the past 4 to 5 days.  Also had intermittent diarrhea.  Notes chills but no fevers.  Denies chest pain, shortness of breath.  States she has abdominal pain as well.  Denies any prior abdominal surgeries.  No dysuria.  No blood in her vomit.  Echo performed on 04/01/2023 shows EF of 30 to 35%.     Physical Exam   Triage Vital Signs: ED Triage Vitals  Encounter Vitals Group     BP 06/01/23 1759 111/74     Systolic BP Percentile --      Diastolic BP Percentile --      Pulse Rate 06/01/23 1759 98     Resp 06/01/23 1759 18     Temp 06/01/23 1759 98.1 F (36.7 C)     Temp Source 06/01/23 1759 Oral     SpO2 06/01/23 1759 99 %     Weight 06/01/23 1800 150 lb (68 kg)     Height 06/01/23 1800 5' (1.524 m)     Head Circumference --      Peak Flow --      Pain Score 06/01/23 1800 0     Pain Loc --      Pain Education --      Exclude from Growth Chart --     Most recent vital signs: Vitals:   06/01/23 2228 06/01/23 2235  BP:  93/70  Pulse:  96  Resp:  16  Temp: 98.2 F (36.8 C)   SpO2:  98%   Physical Exam: I have reviewed the vital signs and nursing notes. General: Awake, alert, no acute distress.  Nontoxic appearing. Head:  Atraumatic, normocephalic.   ENT:  EOM intact, PERRL. Oral mucosa is pink and moist with no lesions. Neck: Neck is supple with full range of motion, No meningeal signs. Cardiovascular:  RRR, No murmurs. Peripheral pulses palpable and equal bilaterally. Respiratory:  Symmetrical chest wall expansion.  No rhonchi, rales, or wheezes.  Good air movement throughout.  No use of accessory muscles.   Musculoskeletal:  No cyanosis  or edema. Moving extremities with full ROM Abdomen:  Soft, tenderness palpation epigastric, right upper quadrant, and right lower quadrant, nondistended. Neuro:  GCS 15, moving all four extremities, interacting appropriately. Speech clear. Psych:  Calm, appropriate.   Skin:  Warm, dry, no rash.    ED Results / Procedures / Treatments   Labs (all labs ordered are listed, but only abnormal results are displayed) Labs Reviewed  LIPASE, BLOOD - Abnormal; Notable for the following components:      Result Value   Lipase 132 (*)    All other components within normal limits  COMPREHENSIVE METABOLIC PANEL - Abnormal; Notable for the following components:   Chloride 91 (*)    CO2 21 (*)    Glucose, Bld 123 (*)    BUN 52 (*)    Creatinine, Ser 3.16 (*)    Calcium 11.1 (*)    Total Protein 8.4 (*)    Total Bilirubin 1.8 (*)    GFR, Estimated 16 (*)    Anion gap 23 (*)    All other components within  normal limits  CBC - Abnormal; Notable for the following components:   WBC 10.8 (*)    All other components within normal limits  URINALYSIS, ROUTINE W REFLEX MICROSCOPIC - Abnormal; Notable for the following components:   Color, Urine YELLOW (*)    APPearance HAZY (*)    Ketones, ur 5 (*)    Leukocytes,Ua MODERATE (*)    Bacteria, UA RARE (*)    All other components within normal limits  LACTIC ACID, PLASMA     EKG    RADIOLOGY Independently interpreted right upper quadrant ultrasound and agree with radiology read   PROCEDURES:  Critical Care performed: No  Procedures   MEDICATIONS ORDERED IN ED: Medications  lactated ringers bolus 1,000 mL (1,000 mLs Intravenous New Bag/Given 06/01/23 2132)  ondansetron (ZOFRAN) injection 4 mg (4 mg Intravenous Given 06/01/23 2131)  iohexol (OMNIPAQUE) 9 MG/ML oral solution 500 mL (500 mLs Oral Contrast Given 06/01/23 2234)     IMPRESSION / MDM / ASSESSMENT AND PLAN / ED COURSE  I reviewed the triage vital signs and the nursing  notes.                              Differential diagnosis includes, but is not limited to, dehydration secondary to vomiting and diarrhea, viral gastroenteritis, acute renal failure, pancreatitis, cholecystitis, appendicitis  Patient's presentation is most consistent with acute complicated illness / injury requiring diagnostic workup.  Patient is a 57 year old female presenting today for nausea, vomiting, and exam notable for tenderness palpation in the epigastric, right upper quadrant, and right lower quadrant.  Vital signs stable on arrival.  Patient was given 1 L fluids as well as Zofran to help with symptoms.  UA moderately concerning for possible UTI although there are squames present which could be equivocal and she has no acute dysuria symptoms.  Mild leukocytosis of 10.8.  Lipase elevated at 132, however it has been seen with higher elevations in the past.  May simply represent pancreatitis as a sole source of symptoms.  However T. bili is elevated at 1.8 with elevated anion gap.  Right upper quadrant ultrasound is equivocal for acute cholecystitis.  MRCP was ordered to further evaluate for possible cholecystitis as well as CT abdomen/pelvis to rule out appendicitis.  Patient was signed out to oncoming provider while pending results of CT imaging.  Patient will then need to be admitted to hospitalist for further care even if imaging is negative for acute surgical pathology given acute renal failure.  The patient is on the cardiac monitor to evaluate for evidence of arrhythmia and/or significant heart rate changes. Clinical Course as of 06/01/23 2334  Tue Jun 01, 2023  2058 Comprehensive metabolic panel(!) New AKI present [DW]    Clinical Course User Index [DW] Janith Lima, MD     FINAL CLINICAL IMPRESSION(S) / ED DIAGNOSES   Final diagnoses:  Nausea vomiting and diarrhea  AKI (acute kidney injury) (HCC)     Rx / DC Orders   ED Discharge Orders     None        Note:   This document was prepared using Dragon voice recognition software and may include unintentional dictation errors.   Janith Lima, MD 06/01/23 [phone]

## 2023-06-01 NOTE — ED Notes (Signed)
Pt left to CT

## 2023-06-01 NOTE — ED Notes (Signed)
Pt left to MRI

## 2023-06-01 NOTE — ED Notes (Signed)
Pt returned form MRI.  ?

## 2023-06-02 ENCOUNTER — Observation Stay: Payer: 59

## 2023-06-02 ENCOUNTER — Other Ambulatory Visit: Payer: Self-pay

## 2023-06-02 DIAGNOSIS — Z7982 Long term (current) use of aspirin: Secondary | ICD-10-CM | POA: Diagnosis not present

## 2023-06-02 DIAGNOSIS — K838 Other specified diseases of biliary tract: Secondary | ICD-10-CM | POA: Diagnosis not present

## 2023-06-02 DIAGNOSIS — I69351 Hemiplegia and hemiparesis following cerebral infarction affecting right dominant side: Secondary | ICD-10-CM | POA: Diagnosis not present

## 2023-06-02 DIAGNOSIS — N1831 Chronic kidney disease, stage 3a: Secondary | ICD-10-CM | POA: Diagnosis not present

## 2023-06-02 DIAGNOSIS — E785 Hyperlipidemia, unspecified: Secondary | ICD-10-CM | POA: Diagnosis not present

## 2023-06-02 DIAGNOSIS — K429 Umbilical hernia without obstruction or gangrene: Secondary | ICD-10-CM | POA: Diagnosis not present

## 2023-06-02 DIAGNOSIS — Z794 Long term (current) use of insulin: Secondary | ICD-10-CM | POA: Diagnosis not present

## 2023-06-02 DIAGNOSIS — R197 Diarrhea, unspecified: Secondary | ICD-10-CM | POA: Diagnosis not present

## 2023-06-02 DIAGNOSIS — Z7984 Long term (current) use of oral hypoglycemic drugs: Secondary | ICD-10-CM | POA: Diagnosis not present

## 2023-06-02 DIAGNOSIS — I5022 Chronic systolic (congestive) heart failure: Secondary | ICD-10-CM | POA: Diagnosis not present

## 2023-06-02 DIAGNOSIS — E1122 Type 2 diabetes mellitus with diabetic chronic kidney disease: Secondary | ICD-10-CM | POA: Diagnosis not present

## 2023-06-02 DIAGNOSIS — Z90710 Acquired absence of both cervix and uterus: Secondary | ICD-10-CM | POA: Diagnosis not present

## 2023-06-02 DIAGNOSIS — N390 Urinary tract infection, site not specified: Secondary | ICD-10-CM | POA: Diagnosis not present

## 2023-06-02 DIAGNOSIS — I130 Hypertensive heart and chronic kidney disease with heart failure and stage 1 through stage 4 chronic kidney disease, or unspecified chronic kidney disease: Secondary | ICD-10-CM | POA: Diagnosis not present

## 2023-06-02 DIAGNOSIS — Z820 Family history of epilepsy and other diseases of the nervous system: Secondary | ICD-10-CM | POA: Diagnosis not present

## 2023-06-02 DIAGNOSIS — Z79899 Other long term (current) drug therapy: Secondary | ICD-10-CM | POA: Diagnosis not present

## 2023-06-02 DIAGNOSIS — I429 Cardiomyopathy, unspecified: Secondary | ICD-10-CM | POA: Diagnosis not present

## 2023-06-02 DIAGNOSIS — R1110 Vomiting, unspecified: Secondary | ICD-10-CM | POA: Diagnosis not present

## 2023-06-02 DIAGNOSIS — R1011 Right upper quadrant pain: Secondary | ICD-10-CM | POA: Diagnosis not present

## 2023-06-02 DIAGNOSIS — E860 Dehydration: Secondary | ICD-10-CM | POA: Diagnosis not present

## 2023-06-02 DIAGNOSIS — J45909 Unspecified asthma, uncomplicated: Secondary | ICD-10-CM | POA: Diagnosis not present

## 2023-06-02 DIAGNOSIS — G40909 Epilepsy, unspecified, not intractable, without status epilepticus: Secondary | ICD-10-CM | POA: Diagnosis not present

## 2023-06-02 DIAGNOSIS — N179 Acute kidney failure, unspecified: Secondary | ICD-10-CM | POA: Diagnosis not present

## 2023-06-02 DIAGNOSIS — N281 Cyst of kidney, acquired: Secondary | ICD-10-CM | POA: Diagnosis not present

## 2023-06-02 DIAGNOSIS — K529 Noninfective gastroenteritis and colitis, unspecified: Secondary | ICD-10-CM

## 2023-06-02 DIAGNOSIS — K8020 Calculus of gallbladder without cholecystitis without obstruction: Secondary | ICD-10-CM

## 2023-06-02 DIAGNOSIS — Z8249 Family history of ischemic heart disease and other diseases of the circulatory system: Secondary | ICD-10-CM | POA: Diagnosis not present

## 2023-06-02 DIAGNOSIS — K2970 Gastritis, unspecified, without bleeding: Secondary | ICD-10-CM

## 2023-06-02 DIAGNOSIS — Z5986 Financial insecurity: Secondary | ICD-10-CM | POA: Diagnosis not present

## 2023-06-02 DIAGNOSIS — E119 Type 2 diabetes mellitus without complications: Secondary | ICD-10-CM

## 2023-06-02 DIAGNOSIS — K2900 Acute gastritis without bleeding: Secondary | ICD-10-CM | POA: Diagnosis present

## 2023-06-02 DIAGNOSIS — I10 Essential (primary) hypertension: Secondary | ICD-10-CM | POA: Insufficient documentation

## 2023-06-02 DIAGNOSIS — Z8543 Personal history of malignant neoplasm of ovary: Secondary | ICD-10-CM | POA: Diagnosis not present

## 2023-06-02 DIAGNOSIS — K828 Other specified diseases of gallbladder: Secondary | ICD-10-CM | POA: Diagnosis not present

## 2023-06-02 LAB — CBG MONITORING, ED
Glucose-Capillary: 77 mg/dL (ref 70–99)
Glucose-Capillary: 82 mg/dL (ref 70–99)

## 2023-06-02 LAB — GLUCOSE, CAPILLARY
Glucose-Capillary: 91 mg/dL (ref 70–99)
Glucose-Capillary: 127 mg/dL — ABNORMAL HIGH (ref 70–99)

## 2023-06-02 LAB — CBC
HCT: 34.5 % — ABNORMAL LOW (ref 36.0–46.0)
Hemoglobin: 11.6 g/dL — ABNORMAL LOW (ref 12.0–15.0)
MCH: 29.1 pg (ref 26.0–34.0)
MCHC: 33.6 g/dL (ref 30.0–36.0)
MCV: 86.7 fL (ref 80.0–100.0)
Platelets: 196 10*3/uL (ref 150–400)
RBC: 3.98 MIL/uL (ref 3.87–5.11)
RDW: 13 % (ref 11.5–15.5)
WBC: 9.1 10*3/uL (ref 4.0–10.5)
nRBC: 0 % (ref 0.0–0.2)

## 2023-06-02 LAB — BASIC METABOLIC PANEL
Anion gap: 15 (ref 5–15)
BUN: 51 mg/dL — ABNORMAL HIGH (ref 6–20)
CO2: 24 mmol/L (ref 22–32)
Calcium: 9.7 mg/dL (ref 8.9–10.3)
Chloride: 94 mmol/L — ABNORMAL LOW (ref 98–111)
Creatinine, Ser: 3.05 mg/dL — ABNORMAL HIGH (ref 0.44–1.00)
GFR, Estimated: 17 mL/min — ABNORMAL LOW (ref >60)
Glucose, Bld: 83 mg/dL (ref 70–99)
Potassium: 4 mmol/L (ref 3.5–5.1)
Sodium: 133 mmol/L — ABNORMAL LOW (ref 135–145)

## 2023-06-02 MED ORDER — MAGNESIUM HYDROXIDE 400 MG/5ML PO SUSP: 30.0000 mL | Freq: Every day | ORAL | Status: DC | PRN

## 2023-06-02 MED ORDER — LACTATED RINGERS IV SOLN: INTRAVENOUS | Status: AC

## 2023-06-02 MED ORDER — LOSARTAN POTASSIUM 50 MG PO TABS: 25.0000 mg | ORAL_TABLET | Freq: Every day | ORAL | Status: DC

## 2023-06-02 MED ORDER — INSULIN ASPART 100 UNIT/ML IJ SOLN: 0.0000 [IU] | Freq: Every day | INTRAMUSCULAR | Status: DC

## 2023-06-02 MED ORDER — METOPROLOL SUCCINATE ER 50 MG PO TB24
50.0000 mg | ORAL_TABLET | Freq: Every day | ORAL | Status: DC
  Administered 2023-06-02 – 2023-06-03 (×2): 50 mg via ORAL
  Filled 2023-06-02 (×2): qty 1

## 2023-06-02 MED ORDER — INSULIN ASPART 100 UNIT/ML IJ SOLN: 0.0000 [IU] | Freq: Three times a day (TID) | INTRAMUSCULAR | Status: DC

## 2023-06-02 MED ORDER — ONDANSETRON HCL 4 MG PO TABS: 4.0000 mg | ORAL_TABLET | Freq: Four times a day (QID) | ORAL | Status: DC | PRN

## 2023-06-02 MED ORDER — SODIUM CHLORIDE 0.9 % IV SOLN
1.0000 g | INTRAVENOUS | Status: DC
  Administered 2023-06-02 – 2023-06-03 (×2): 1 g via INTRAVENOUS
  Filled 2023-06-02 (×3): qty 10

## 2023-06-02 MED ORDER — ENOXAPARIN SODIUM 30 MG/0.3ML IJ SOSY
30.0000 mg | PREFILLED_SYRINGE | INTRAMUSCULAR | Status: DC
Start: 2023-06-02 — End: 2023-06-04
  Administered 2023-06-02 – 2023-06-04 (×3): 30 mg via SUBCUTANEOUS
  Filled 2023-06-02 (×3): qty 0.3

## 2023-06-02 MED ORDER — TRAZODONE HCL 50 MG PO TABS
25.0000 mg | ORAL_TABLET | Freq: Every evening | ORAL | Status: DC | PRN
  Administered 2023-06-02: 25 mg via ORAL
  Filled 2023-06-02: qty 1

## 2023-06-02 MED ORDER — TECHNETIUM TC 99M MEBROFENIN IV KIT
5.0000 | PACK | Freq: Once | INTRAVENOUS | Status: AC | PRN
  Administered 2023-06-02: 5.46 via INTRAVENOUS

## 2023-06-02 MED ORDER — INSULIN GLARGINE-YFGN 100 UNIT/ML ~~LOC~~ SOLN
15.0000 [IU] | Freq: Every day | SUBCUTANEOUS | Status: DC
  Administered 2023-06-02: 15 [IU] via SUBCUTANEOUS
  Filled 2023-06-02 (×2): qty 0.15

## 2023-06-02 MED ORDER — PANTOPRAZOLE SODIUM 40 MG IV SOLR
40.0000 mg | Freq: Two times a day (BID) | INTRAVENOUS | Status: DC
  Administered 2023-06-02 – 2023-06-04 (×5): 40 mg via INTRAVENOUS
  Filled 2023-06-02 (×5): qty 10

## 2023-06-02 MED ORDER — ORAL CARE MOUTH RINSE: 15.0000 mL | OROMUCOSAL | Status: DC | PRN

## 2023-06-02 MED ORDER — OYSTER SHELL CALCIUM/D3 500-5 MG-MCG PO TABS
1.0000 | ORAL_TABLET | Freq: Two times a day (BID) | ORAL | Status: DC
  Administered 2023-06-02 – 2023-06-03 (×4): 1 via ORAL
  Filled 2023-06-02 (×5): qty 1

## 2023-06-02 MED ORDER — ADULT MULTIVITAMIN W/MINERALS CH
1.0000 | ORAL_TABLET | Freq: Every day | ORAL | Status: DC
  Administered 2023-06-02 – 2023-06-03 (×2): 1 via ORAL
  Filled 2023-06-02 (×3): qty 1

## 2023-06-02 MED ORDER — ACETAMINOPHEN 650 MG RE SUPP: 650.0000 mg | Freq: Four times a day (QID) | RECTAL | Status: DC | PRN

## 2023-06-02 MED ORDER — ONDANSETRON HCL 4 MG/2ML IJ SOLN: 4.0000 mg | Freq: Four times a day (QID) | INTRAMUSCULAR | Status: DC | PRN

## 2023-06-02 MED ORDER — ACETAMINOPHEN 325 MG PO TABS
650.0000 mg | ORAL_TABLET | Freq: Four times a day (QID) | ORAL | Status: DC | PRN
  Administered 2023-06-02: 650 mg via ORAL
  Filled 2023-06-02: qty 2

## 2023-06-02 MED ORDER — ATORVASTATIN CALCIUM 20 MG PO TABS
80.0000 mg | ORAL_TABLET | Freq: Every day | ORAL | Status: DC
  Administered 2023-06-02 – 2023-06-04 (×3): 80 mg via ORAL
  Filled 2023-06-02 (×3): qty 4

## 2023-06-02 MED ORDER — CEFTRIAXONE SODIUM 1 G IJ SOLR
1.0000 g | Freq: Once | INTRAMUSCULAR | Status: AC
  Administered 2023-06-02: 1 g via INTRAVENOUS
  Filled 2023-06-02: qty 10

## 2023-06-02 NOTE — ED Notes (Signed)
Patient to Nuclear Medicine now.

## 2023-06-02 NOTE — Assessment & Plan Note (Signed)
-   We will continue statin therapy. 

## 2023-06-02 NOTE — Assessment & Plan Note (Signed)
-   We will continue IV Rocephin and follow urine culture sensitivity as well as blood cultures. - She could be having ascending UTI given her recurrent nausea and vomiting.

## 2023-06-02 NOTE — ED Notes (Signed)
Patient returned from nuclear med. Patient asking to use the bedpan. This RN assisted patient to urinate in bedpan. Patient cleaned up and a paper pad was placed under patient. Patient given lunch tray and call light was placed within reach. Patient denies other needs at this time.

## 2023-06-02 NOTE — ED Notes (Signed)
Patient requesting to use the bedpan at this time. Patient urinated clear, yellow urine. Patient cleaned up and positioned comfortably in bed with call light in reach.

## 2023-06-02 NOTE — Assessment & Plan Note (Addendum)
-   She does not seem to have signs of obstruction. - She had cholelithiasis before and has no signs of cholecystitis at this time. - I doubt that her nausea and vomiting is from biliary colic. --Will get surgery consult. I notified Dr.Rodenburg about the patient.

## 2023-06-02 NOTE — Progress Notes (Signed)
Nonbillable note Patient admitted to the hospital for evaluation of abdominal pain, nausea and vomiting concerning for possible biliary colic.  Seen by surgery and not convinced of same.  HIDA scan pending. Symptoms have improved but patient noted to have an AKI from volume depletion related to dehydration Continue IV fluid hydration Trial of clears Obtain HIDA scan

## 2023-06-02 NOTE — H&P (Addendum)
[place]   PATIENT NAME: Sarah Lane    MR#:  [id number]  DATE OF BIRTH:  [birth year]  DATE OF ADMISSION:  06/01/2023  PRIMARY CARE PHYSICIAN: Care, [facility]   Patient is coming from: Home  REQUESTING/REFERRING PHYSICIAN: Ward, Layla Maw, DO  CHIEF COMPLAINT:   Chief Complaint  Patient presents with   Nausea    HISTORY OF PRESENT ILLNESS:  Sarah Lane is a 57 y.o. female with medical history significant for asthma, aortic atherosclerosis, type 2 diabetes mellitus, cardiomyopathy, ovarian cancer, CVA, hypertension, PSVT and seizure disorder, who presented to the emergency room with acute onset of intractable nausea and vomiting for the last 4 to 5 days with intermittent diarrhea.  She admitted to lower abdominal cramps with her diarrhea and epigastric pain. She denies any fever or chills.  No dyspnea or cough or wheezing.  No chest pain or palpitations.  No bilious vomitus or hematemesis.  No melena or bright red bleeding per rectum.  No presyncope or syncope.  ED Course: Upon presentation to the emergency room, vital signs were within normal.  Labs revealed BUN of 52 and creatinine 3.16 with calcium of 11.1 anion gap of 23 and chloride 91 with CO2 21.  Serum lipase was 132 and total protein 8.4.  Total bili was 1.8.  Lactic acid was 1.6.  CBC showed WBC of 10.8 and was otherwise unremarkable.  UA was positive for UTI. EKG as reviewed by me : none. Imaging: Abdominal pelvic CT scan revealed the following: Cholelithiasis similar to that seen on recent ultrasound examination.   Stable 14 mm nodule in the right lower lobe. No follow-up is recommended as described above.   Umbilical hernia containing fat and small bowel without complicating factors.  The patient was given 1 L bolus of IV lactated Ringer followed 125 mL/h and 1 g of IV Rocephin.  She will be admitted to a medical telemetry observation bed for further evaluation and management  PAST MEDICAL HISTORY:    Past Medical History:  Diagnosis Date   Aortic atherosclerosis (HCC)    a. 03/2023 noted on CT.   Asthma    childhood asthma   Cancer (HCC)    ovarian cancer   Cardiomyopathy (HCC)    a. 01/2023 Echo [facility]): EF 15-20%, impaired relaxation, mildly to mod dil RV w/ mild RV dysfxn, mod PH, BAE; b. 03/2023 Echo: EF 30-35%, glob HK, GrI DD, mod reduced RV fxn, mod enlarged RV, RVSP 42.14mmHg, mild MR, mild-mod TR.   Chronic HFrEF (heart failure with reduced ejection fraction) (HCC)    a. Dx 01/2023-->[facility] Echo: EF 15-20%; b. 03/2023 Echo: EF 30-35%.   Diabetes (HCC)    Diabetes mellitus without complication (HCC)    Expressive aphasia    a. 01/2023 L MCA occlusive stroke s/p thrombectomy.   Family history of adverse reaction to anesthesia    Grand father had facial swelling   H/O ischemic left MCA stroke    a. 01/2023 s/p admission @ [facility]. CTA Head w/ M1 occlusion of L MCA, possible high-grade stenosis of distal A3 segment. S/p thrombectomy.   Hypertension    PSVT (paroxysmal supraventricular tachycardia) (HCC)    Right lower lobe pulmonary nodule    a. 03/2023 CT Chest - 14mm - stable.   Seizures (HCC)    petit mals at age 61 treated with medications. Last one age 12    PAST SURGICAL HISTORY:   Past Surgical History:  Procedure Laterality Date  ABDOMINAL HYSTERECTOMY  2011   ovarian cancer   IRRIGATION AND DEBRIDEMENT ABDOMEN N/A 12/31/2014   Procedure: IRRIGATION AND DEBRIDEMENT ABDOMINAL WALL ABSCESS;  Surgeon: Luretha Murphy, MD;  Location: [facility] ORS;  Service: General;  Laterality: N/A;   RIGHT/LEFT HEART CATH AND CORONARY ANGIOGRAPHY N/A 04/06/2023   Procedure: RIGHT/LEFT HEART CATH AND CORONARY ANGIOGRAPHY;  Surgeon: Yvonne Kendall, MD;  Location: [facility] INVASIVE CV LAB;  Service: Cardiovascular;  Laterality: N/A;    SOCIAL HISTORY:   Social History   Tobacco Use   Smoking status: Never   Smokeless tobacco: Not on file  Substance Use Topics   Alcohol use: No    FAMILY HISTORY:    Family History  Problem Relation Age of Onset   CAD Father        s/p CABG x 2 and stenting   Parkinson's disease Father     DRUG ALLERGIES:   Allergies  Allergen Reactions   Jardiance [Empagliflozin]     Multiple side effect complications including: DKA, pancreatitis, renal failure. Avoid re-prescribing   Codeine Nausea And Vomiting   Ibuprofen Nausea And Vomiting    REVIEW OF SYSTEMS:   ROS As per history of present illness. All pertinent systems were reviewed above. Constitutional, HEENT, cardiovascular, respiratory, GI, GU, musculoskeletal, neuro, psychiatric, endocrine, integumentary and hematologic systems were reviewed and are otherwise negative/unremarkable except for positive findings mentioned above in the HPI.   MEDICATIONS AT HOME:   Prior to Admission medications   Medication Sig Start Date End Date Taking? Authorizing Provider  aspirin 81 MG chewable tablet Chew 81 mg by mouth daily.   Yes [provider]  atorvastatin (LIPITOR) 80 MG tablet Take 80 mg by mouth daily.   Yes [provider]  calcium-vitamin D (OSCAL WITH D) 500-200 MG-UNIT per tablet Take 1 tablet by mouth 2 (two) times daily.   Yes [provider]  furosemide (LASIX) 40 MG tablet Take 1 tablet (40 mg total) by mouth daily. 04/19/23  Yes Gollan, Tollie Pizza, MD  LANTUS SOLOSTAR 100 UNIT/ML Solostar Pen Inject 15 Units into the skin at bedtime. 03/11/23  Yes [provider]  losartan (COZAAR) 25 MG tablet Take 1 tablet (25 mg total) by mouth daily. 04/19/23  Yes Gollan, Tollie Pizza, MD  metFORMIN (GLUCOPHAGE) 1000 MG tablet Take 1,000 mg by mouth 2 (two) times daily with a meal.   Yes [provider]  metoprolol succinate (TOPROL-XL) 50 MG 24 hr tablet Take 1 tablet (50 mg total) by mouth daily. Take with or immediately following a meal. 04/19/23  Yes Gollan, Tollie Pizza, MD  Multiple Vitamin (MULTIVITAMIN WITH MINERALS) TABS tablet Take 1 tablet by mouth daily.  04/08/23  Yes Darlin Priestly, MD      VITAL SIGNS:  Blood pressure 137/72, pulse 84, temperature 97.8 F (36.6 C), temperature source Oral, resp. rate 15, height 5' (1.524 m), weight 68 kg, SpO2 100%.  PHYSICAL EXAMINATION:  Physical Exam  GENERAL:  57 y.o.-year-old patient lying in the bed with no acute distress.  EYES: Pupils equal, round, reactive to light and accommodation. No scleral icterus. Extraocular muscles intact.  HEENT: Head atraumatic, normocephalic. Oropharynx and nasopharynx clear.  NECK:  Supple, no jugular venous distention. No thyroid enlargement, no tenderness.  LUNGS: Normal breath sounds bilaterally, no wheezing, rales,rhonchi or crepitation. No use of accessory muscles of respiration.  CARDIOVASCULAR: Regular rate and rhythm, S1, S2 normal. No murmurs, rubs, or gallops.  ABDOMEN: Soft, nondistended, with generalized tenderness mainly in infraumbilical/suprapubic and  epigastric areas. Bowel sounds present. No organomegaly or mass.  EXTREMITIES: No pedal edema, cyanosis, or clubbing.  NEUROLOGIC: Cranial nerves II through XII are intact. Muscle strength 5/5 in all extremities. Sensation intact. Gait not checked.  PSYCHIATRIC: The patient is alert and oriented x 3.  Normal affect and good eye contact. SKIN: No obvious rash, lesion, or ulcer.   LABORATORY PANEL:   CBC Recent Labs  Lab 06/02/23 0437  WBC 9.1  HGB 11.6*  HCT 34.5*  PLT 196   ------------------------------------------------------------------------------------------------------------------  Chemistries  Recent Labs  Lab 06/01/23 1805 06/02/23 0437  NA 135 133*  K 5.1 4.0  CL 91* 94*  CO2 21* 24  GLUCOSE 123* 83  BUN 52* 51*  CREATININE 3.16* 3.05*  CALCIUM 11.1* 9.7  AST 19  --   ALT 13  --   ALKPHOS 49  --   BILITOT 1.8*  --    ------------------------------------------------------------------------------------------------------------------  Cardiac Enzymes No results for input(s):  "TROPONINI" in the last 168 hours. ------------------------------------------------------------------------------------------------------------------  RADIOLOGY:  MR ABDOMEN MRCP WO CONTRAST  Result Date: 06/02/2023 CLINICAL DATA:  Right upper quadrant abdominal pain. Gallstones, sludge, and positive Murphy's sign on ultrasound. EXAM: MRI ABDOMEN WITHOUT CONTRAST  (INCLUDING MRCP) TECHNIQUE: Multiplanar multisequence MR imaging of the abdomen was performed. Heavily T2-weighted images of the biliary and pancreatic ducts were obtained, and three-dimensional MRCP images were rendered by post processing. COMPARISON:  Ultrasound abdomen 06/01/2023. CT abdomen and pelvis 04/01/2023 FINDINGS: Lower chest: Right lower lobe pulmonary nodule measuring 1.2 cm diameter. No change since prior CT scans dating back to 09/23/2010. Long-term stability suggests benign etiology. Hepatobiliary: Homogeneous normal appearance of the liver parenchyma. No focal liver lesions identified. No intrahepatic or extrahepatic bile duct dilatation. No common duct stones are identified. Layering sludge in the gallbladder. No discrete stones are demonstrated. No gallbladder wall thickening or pericholecystic edema. No inflammatory stranding is demonstrated. Pancreas: No mass, inflammatory changes, or other parenchymal abnormality identified. Spleen:  Within normal limits in size and appearance. Adrenals/Urinary Tract: No adrenal gland nodules. Bilateral renal cysts, measuring up to 1.9 cm diameter. No change since previous CT scans. No imaging follow-up is indicated. No hydronephrosis. Stomach/Bowel: Visualized portions within the abdomen are unremarkable. Vascular/Lymphatic: No pathologically enlarged lymph nodes identified. No abdominal aortic aneurysm demonstrated. Other:  No free fluid in the abdomen. Musculoskeletal: No suspicious bone lesions identified. Old appearing compression deformities in the lumbar vertebrae. IMPRESSION: 1.  Layering sludge in the gallbladder. No gallbladder wall thickening or inflammatory stranding identified. 2. No bile duct dilatation.  No common duct stones demonstrated. 3. Stable appearance of right lower lung nodule and bilateral renal cysts. No imaging follow-up of these items is indicated. Electronically Signed   By: Burman Nieves M.D.   On: 06/02/2023 01:43   MR 3D Recon At Scanner  Result Date: 06/02/2023 CLINICAL DATA:  Right upper quadrant abdominal pain. Gallstones, sludge, and positive Murphy's sign on ultrasound. EXAM: MRI ABDOMEN WITHOUT CONTRAST  (INCLUDING MRCP) TECHNIQUE: Multiplanar multisequence MR imaging of the abdomen was performed. Heavily T2-weighted images of the biliary and pancreatic ducts were obtained, and three-dimensional MRCP images were rendered by post processing. COMPARISON:  Ultrasound abdomen 06/01/2023. CT abdomen and pelvis 04/01/2023 FINDINGS: Lower chest: Right lower lobe pulmonary nodule measuring 1.2 cm diameter. No change since prior CT scans dating back to 09/23/2010. Long-term stability suggests benign etiology. Hepatobiliary: Homogeneous normal appearance of the liver parenchyma. No focal liver lesions identified. No intrahepatic or extrahepatic bile duct dilatation. No common duct  stones are identified. Layering sludge in the gallbladder. No discrete stones are demonstrated. No gallbladder wall thickening or pericholecystic edema. No inflammatory stranding is demonstrated. Pancreas: No mass, inflammatory changes, or other parenchymal abnormality identified. Spleen:  Within normal limits in size and appearance. Adrenals/Urinary Tract: No adrenal gland nodules. Bilateral renal cysts, measuring up to 1.9 cm diameter. No change since previous CT scans. No imaging follow-up is indicated. No hydronephrosis. Stomach/Bowel: Visualized portions within the abdomen are unremarkable. Vascular/Lymphatic: No pathologically enlarged lymph nodes identified. No abdominal aortic  aneurysm demonstrated. Other:  No free fluid in the abdomen. Musculoskeletal: No suspicious bone lesions identified. Old appearing compression deformities in the lumbar vertebrae. IMPRESSION: 1. Layering sludge in the gallbladder. No gallbladder wall thickening or inflammatory stranding identified. 2. No bile duct dilatation.  No common duct stones demonstrated. 3. Stable appearance of right lower lung nodule and bilateral renal cysts. No imaging follow-up of these items is indicated. Electronically Signed   By: Burman Nieves M.D.   On: 06/02/2023 01:43   CT ABDOMEN PELVIS WO CONTRAST  Result Date: 06/02/2023 CLINICAL DATA:  Nausea and vomiting with right-sided abdominal pain, initial encounter EXAM: CT ABDOMEN AND PELVIS WITHOUT CONTRAST TECHNIQUE: Multidetector CT imaging of the abdomen and pelvis was performed following the standard protocol without IV contrast. RADIATION DOSE REDUCTION: This exam was performed according to the departmental dose-optimization program which includes automated exposure control, adjustment of the mA and/or kV according to patient size and/or use of iterative reconstruction technique. COMPARISON:  Ultrasound from earlier in the same day. FINDINGS: Lower chest: 14 mm rounded nodule is noted in the right lower lobe stable in appearance from the prior exam. No further follow-up is noted as this is been present since 20/12 and shows no activity on prior PET-CT. No new focal nodule is seen. Hepatobiliary: Liver is within normal limits. Gallbladder is well distended with dependent density consistent with stones similar to that seen on prior ultrasound examination. Pancreas: Unremarkable. No pancreatic ductal dilatation or surrounding inflammatory changes. Spleen: Normal in size without focal abnormality. Adrenals/Urinary Tract: Adrenal glands are within normal limits. Kidneys demonstrate no renal calculi or obstructive changes. A simple appearing cyst is noted in the right kidney  stable from the prior study. No further follow-up is recommended. The bladder is decompressed. Stomach/Bowel: The appendix is within normal limits. No obstructive or inflammatory changes of colon are seen. Stomach is unremarkable. No small bowel abnormality is seen. Vascular/Lymphatic: Aortic atherosclerosis. No enlarged abdominal or pelvic lymph nodes. Reproductive: Status post hysterectomy. No adnexal masses. Other: Small umbilical hernia is noted containing fat and a single knuckle of small bowel. No obstructive changes are seen. The overall appearance is stable. Musculoskeletal: No acute or significant osseous findings. IMPRESSION: Cholelithiasis similar to that seen on recent ultrasound examination. Stable 14 mm nodule in the right lower lobe. No follow-up is recommended as described above. Umbilical hernia containing fat and small bowel without complicating factors. Electronically Signed   By: Alcide Clever M.D.   On: 06/02/2023 01:19   US ABDOMEN LIMITED RUQ (LIVER/GB)  Result Date: 06/01/2023 CLINICAL DATA:  Right upper quadrant and epigastric pain. Nausea and vomiting. Concern for cholecystitis. EXAM: ULTRASOUND ABDOMEN LIMITED RIGHT UPPER QUADRANT COMPARISON:  CT 04/01/2023 FINDINGS: Gallbladder: Physiologically distended. Layering echogenic material likely represents a combination of sludge and small stones. No gallbladder wall thickening. Positive sonographic Murphy sign noted by sonographer. Common bile duct: Diameter: 6 mm, normal. Liver: No focal lesion identified. Within normal limits in parenchymal echogenicity.  Portal vein is patent on color Doppler imaging with normal direction of blood flow towards the liver. Other: No right upper quadrant ascites. IMPRESSION: 1. Layering sludge and stones in the gallbladder without wall thickening or sonographic findings of acute cholecystitis. However, sonographer reports a positive sonographic Murphy sign. Consider further assessment with nuclear medicine  HIDA scan as clinically indicated. 2. No biliary dilatation. Electronically Signed   By: Narda Rutherford M.D.   On: 06/01/2023 22:36      IMPRESSION AND PLAN:  Assessment and Plan: * AKI (acute kidney injury) (HCC) - This is likely prerenal due to volume depletion and dehydration from her acute gastroenteritis. - The patient will be admitted to a medical telemetry observation bed. - We will continue hydration with IV normal saline. - Will follow BMP. - We will avoid nephrotoxins.  Acute gastroenteritis - I suspect viral etiology. - She could be having recurrent nausea and vomiting with her UTI due to possibly ascending UTI and pyelonephritis. - The patient will be hydrated with IV normal saline as mentioned above. - As needed antiemetic emetics and antidiarrheals will be given. - This could be the culprit for his elevated lipase level. - I doubt pancreatitis in her case. - We will still follow lipase levels.  Acute lower UTI - We will continue IV Rocephin and follow urine culture sensitivity as well as blood cultures. - She could be having ascending UTI given her recurrent nausea and vomiting.  Cholelithiasis - She does not seem to have signs of obstruction. - She had cholelithiasis before and has no signs of cholecystitis at this time. - I doubt that her nausea and vomiting is from biliary colic. --Will get surgery consult. I notified Dr.Rodenburg about the patient.  Essential hypertension - We will continue Toprol-XL and hold Cozaar given acute kidney injury.  Type 2 diabetes mellitus without complications (HCC) - The patient will be placed on supplemental coverage with NovoLog. - We will continue basal coverage. - We will hold off metformin.  Dyslipidemia - We will continue statin therapy.   DVT prophylaxis: Lovenox.  Advanced Care Planning:  Code Status: full code.  Family Communication:  The plan of care was discussed in details with the patient (and family). I  answered all questions. The patient agreed to proceed with the above mentioned plan. Further management will depend upon hospital course. Disposition Plan: Back to previous home environment Consults called: General surgery. All the records are reviewed and case discussed with ED provider.  Status is: Observation  I certify that at the time of admission, it is my clinical judgment that the patient will require hospital care extending less than 2 midnights.                            Dispo: The patient is from: Home              Anticipated d/c is to: Home              Patient currently is not medically stable to d/c.              Difficult to place patient: No  Hannah Beat M.D on 06/02/2023 at 7:04 AM  [facility]   From 7 PM-7 AM, contact night-coverage [email or web address]  CC: Primary care physician; Care, [facility]

## 2023-06-02 NOTE — Consult Note (Signed)
[facility] SURGICAL CONSULTATION NOTE (initial) - cpt: [id number]   HISTORY OF PRESENT ILLNESS (HPI):  57 y.o. female presented to [facility] ED yesterday for evaluation of nausea. Patient reports a 4-5 day history of intermittent nausea, emesis, and occasional diarrhea. She is also reporting  generalized abdominal pain. No fever, chills, CP, SOB, urinary changes. She has been able to tolerate soup intermittently but otherwise has not been able to tolerate any additional PO. She does have a history T2DM, cardiomyopathy, CHF with ED of 30 to 35% (08/22/20214). Previous abdominal surgeries are positive for abdominal hysterectomy.  Work up in the ED revealed a mild leukocytosis to 10.8K (now 9.1K), Hgb to 14.3 (now 11.6 - likely hemoconcentration on presentation), AKI with sCr - 3.16 (now 3.05), total bilirubin 1.8, venous lactate 1.6. She did have RUQ [place] which showed cholelithiasis without changes concerning for cholelithiasis. Follow up CT and MRCP showed similar without cholecystitis nor choledocholithiasis. She was admitted to medicine service for intractable nausea/emesis.   Surgery is consulted by hospitalist physician Dr. Valente David, MD in this context for evaluation and management of cholelithiasis.   PAST MEDICAL HISTORY (PMH):  Past Medical History:  Diagnosis Date   Aortic atherosclerosis (HCC)    a. 03/2023 noted on CT.   Asthma    childhood asthma   Cancer (HCC)    ovarian cancer   Cardiomyopathy (HCC)    a. 01/2023 [facility]): EF 15-20%, impaired relaxation, mildly to mod dil RV w/ mild RV dysfxn, mod PH, BAE; b. 03/2023 Echo: EF 30-35%, glob HK, GrI DD, mod reduced RV fxn, mod enlarged RV, RVSP 42.60mmHg, mild MR, mild-mod TR.   Chronic HFrEF (heart failure with reduced ejection fraction) (HCC)    a. Dx 01/2023-->[facility]: EF 15-20%; b. 03/2023 Echo: EF 30-35%.   Diabetes (HCC)    Diabetes mellitus without complication (HCC)    Expressive aphasia    a. 01/2023 L MCA occlusive  stroke s/p thrombectomy.   Family history of adverse reaction to anesthesia    Grand father had facial swelling   H/O ischemic left MCA stroke    a. 01/2023 s/p admission @ [facility]. CTA Head w/ M1 occlusion of L MCA, possible high-grade stenosis of distal A3 segment. S/p thrombectomy.   Hypertension    PSVT (paroxysmal supraventricular tachycardia) (HCC)    Right lower lobe pulmonary nodule    a. 03/2023 CT Chest - 14mm - stable.   Seizures (HCC)    petit mals at age 22 treated with medications. Last one age 55     PAST SURGICAL HISTORY (PSH):  Past Surgical History:  Procedure Laterality Date   ABDOMINAL HYSTERECTOMY  2011   ovarian cancer   IRRIGATION AND DEBRIDEMENT ABDOMEN N/A 12/31/2014   Procedure: IRRIGATION AND DEBRIDEMENT ABDOMINAL WALL ABSCESS;  Surgeon: Luretha Murphy, MD;  Location: [facility];  Service: General;  Laterality: N/A;   RIGHT/LEFT HEART CATH AND CORONARY ANGIOGRAPHY N/A 04/06/2023   Procedure: RIGHT/LEFT HEART CATH AND CORONARY ANGIOGRAPHY;  Surgeon: Yvonne Kendall, MD;  Location: [facility] INVASIVE CV LAB;  Service: Cardiovascular;  Laterality: N/A;     MEDICATIONS:  Prior to Admission medications   Medication Sig Start Date End Date Taking? Authorizing Provider  aspirin 81 MG chewable tablet Chew 81 mg by mouth daily.   Yes [provider]  atorvastatin (LIPITOR) 80 MG tablet Take 80 mg by mouth daily.   Yes [provider]  calcium-vitamin D (OSCAL WITH D) 500-200 MG-UNIT per tablet Take 1 tablet  by mouth 2 (two) times daily.   Yes [provider]  furosemide (LASIX) 40 MG tablet Take 1 tablet (40 mg total) by mouth daily. 04/19/23  Yes Gollan, Tollie Pizza, MD  LANTUS SOLOSTAR 100 UNIT/ML Solostar Pen Inject 15 Units into the skin at bedtime. 03/11/23  Yes [provider]  losartan (COZAAR) 25 MG tablet Take 1 tablet (25 mg total) by mouth daily. 04/19/23  Yes Gollan, Tollie Pizza, MD  metFORMIN (GLUCOPHAGE) 1000 MG tablet Take 1,000 mg by mouth 2  (two) times daily with a meal.   Yes [provider]  metoprolol succinate (TOPROL-XL) 50 MG 24 hr tablet Take 1 tablet (50 mg total) by mouth daily. Take with or immediately following a meal. 04/19/23  Yes Gollan, Tollie Pizza, MD  Multiple Vitamin (MULTIVITAMIN WITH MINERALS) TABS tablet Take 1 tablet by mouth daily. 04/08/23  Yes Darlin Priestly, MD     ALLERGIES:  Allergies  Allergen Reactions   Jardiance [Empagliflozin]     Multiple side effect complications including: DKA, pancreatitis, renal failure. Avoid re-prescribing   Codeine Nausea And Vomiting   Ibuprofen Nausea And Vomiting     SOCIAL HISTORY:  Social History   Socioeconomic History   Marital status: Widowed    Spouse name: Not on file   Number of children: Not on file   Years of education: Not on file   Highest education level: Not on file  Occupational History   Not on file  Tobacco Use   Smoking status: Never   Smokeless tobacco: Not on file  Vaping Use   Vaping status: Never Used  Substance and Sexual Activity   Alcohol use: No   Drug use: No   Sexual activity: Never  Other Topics Concern   Not on file  Social History Narrative   Widowed.  Lives in [place] w/ roommate.  Does not routinely exercise.   Social Determinants of Health   Financial Resource Strain: Medium Risk (05/17/2023)   Received from [facility]   Overall Financial Resource Strain (CARDIA)    Difficulty of Paying Living Expenses: Somewhat hard  Food Insecurity: No Food Insecurity (05/17/2023)   Received from [facility]   Hunger Vital Sign    Worried About Running Out of Food in the Last Year: Never true    Ran Out of Food in the Last Year: Never true  Transportation Needs: No Transportation Needs (05/17/2023)   Received from [facility] - Transportation    Lack of Transportation (Medical): No    Lack of Transportation (Non-Medical): No  Physical Activity: Insufficiently Active (05/17/2023)   Received from [facility]   Exercise Vital Sign    Days of Exercise per Week: 2 days    Minutes of Exercise per Session: 60 min  Stress: No Stress Concern Present (05/18/2023)   Received from [facility] of Occupational Health - Occupational Stress Questionnaire    Feeling of Stress : Not at all  Social Connections: Socially Isolated (05/18/2023)   Received from [facility]   Social Connection and Isolation Panel [NHANES]    Frequency of Communication with Friends and Family: Once a week    Frequency of Social Gatherings with Friends and Family: Never    Attends Religious Services: Never    Database administrator or Organizations: No    Attends Banker Meetings: Never    Marital Status: Widowed  Catering manager  Violence: Not At Risk (05/17/2023)   Received from [facility]   Humiliation, Afraid, Rape, and Kick questionnaire    Fear of Current or Ex-Partner: No    Emotionally Abused: No    Physically Abused: No    Sexually Abused: No     FAMILY HISTORY:  Family History  Problem Relation Age of Onset   CAD Father        s/p CABG x 2 and stenting   Parkinson's disease Father       REVIEW OF SYSTEMS:  Review of Systems  Constitutional:  Positive for malaise/fatigue. Negative for chills and fever.  Respiratory:  Negative for cough and shortness of breath.   Cardiovascular:  Negative for chest pain and palpitations.  Gastrointestinal:  Positive for abdominal pain, diarrhea, nausea and vomiting. Negative for blood in stool, constipation and melena.  Genitourinary:  Negative for dysuria and urgency.  All other systems reviewed and are negative.   VITAL SIGNS:  Temp:  [97.6 F (36.4 C)-98.2 F (36.8 C)] 97.8 F (36.6 C) (10/23 0622) Pulse Rate:  [75-98] 84 (10/23 0645) Resp:  [14-19] 15 (10/23 0645) BP: (90-137)/(45-83) 137/72 (10/23 0630) SpO2:  [95 %-100 %] 100 % (10/23 0645) Weight:  [68 kg] 68 kg (10/22 1800)     Height: 5' (152.4 cm)  Weight: 68 kg BMI (Calculated): 29.3   INTAKE/OUTPUT:  10/22 0701 - 10/23 0700 In: 1274.5 [I.V.:174.5; IV Piggyback:1100] Out: -   PHYSICAL EXAM:  Physical Exam Vitals and nursing note reviewed. Exam conducted with a chaperone present.  Constitutional:      General: She is not in acute distress.    Appearance: Normal appearance. She is normal weight. She is not ill-appearing.  HENT:     Head: Normocephalic and atraumatic.     Mouth/Throat:     Comments: Poor dentition  Eyes:     General: No scleral icterus.    Conjunctiva/sclera: Conjunctivae normal.  Cardiovascular:     Rate and Rhythm: Normal rate.     Pulses: Normal pulses.     Heart sounds: No murmur heard. Pulmonary:     Effort: Pulmonary effort is normal. No respiratory distress.  Abdominal:     General: Abdomen is flat. A surgical scar is present. There is no distension.     Palpations: Abdomen is soft.     Tenderness: There is generalized abdominal tenderness. There is no guarding or rebound. Negative signs include Murphy's sign.     Hernia: A hernia is present. Hernia is present in the umbilical area.     Comments: Abdomen is soft, she has somewhat generalized tenderness but seems to be worse in lower abdomen on examination, Murphy's Sign is negative, no rebound/guarding.   Genitourinary:    Comments: Deferred Musculoskeletal:     Right lower leg: No edema.     Left lower leg: No edema.  Skin:    General: Skin is warm and dry.     Coloration: Skin is not jaundiced.  Neurological:     General: No focal deficit present.     Mental Status: She is alert. Mental status is at baseline.  Psychiatric:        Mood and Affect: Mood normal.        Behavior: Behavior normal.      Labs:     Latest Ref Rng & Units 06/02/2023    4:37 AM 06/01/2023    6:05 PM 04/07/2023    6:27 AM  CBC  WBC 4.0 -  10.5 K/uL 9.1  10.8  7.4   Hemoglobin 12.0 - 15.0 g/dL 29.5  28.4  13.2   Hematocrit 36.0 - 46.0 % 34.5  42.8  37.5    Platelets 150 - 400 K/uL 196  273  249       Latest Ref Rng & Units 06/02/2023    4:37 AM 06/01/2023    6:05 PM 04/07/2023    6:27 AM  CMP  Glucose 70 - 99 mg/dL 83  440  102   BUN 6 - 20 mg/dL 51  52  15   Creatinine 0.44 - 1.00 mg/dL 7.25  3.66  4.40   Sodium 135 - 145 mmol/L 133  135  140   Potassium 3.5 - 5.1 mmol/L 4.0  5.1  4.0   Chloride 98 - 111 mmol/L 94  91  104   CO2 22 - 32 mmol/L 24  21  26    Calcium 8.9 - 10.3 mg/dL 9.7  34.7  8.9   Total Protein 6.5 - 8.1 g/dL  8.4    Total Bilirubin 0.3 - 1.2 mg/dL  1.8    Alkaline Phos 38 - 126 U/L  49    AST 15 - 41 U/L  19    ALT 0 - 44 U/L  13       Imaging studies:   RUQ [place] (06/01/2023) personally reviewed with layering stones vs sludge, no pericholecystic fluid, no wall inflammation, and radiologist report reviewed below:  IMPRESSION: 1. Layering sludge and stones in the gallbladder without wall thickening or sonographic findings of acute cholecystitis. However, sonographer reports a positive sonographic Murphy sign. Consider further assessment with nuclear medicine HIDA scan as clinically indicated. 2. No biliary dilatation.   MRCP (06/01/2023) personally reviewed without choledocholithiasis, again with cholelithiasis but no evidence of cholecystitis, and radiologist report reviewed below:  IMPRESSION: 1. Layering sludge in the gallbladder. No gallbladder wall thickening or inflammatory stranding identified. 2. No bile duct dilatation.  No common duct stones demonstrated. 3. Stable appearance of right lower lung nodule and bilateral renal cysts. No imaging follow-up of these items is indicated.   CT Abdomen/Pelvis (06/01/2023) personally reviewed again showing stones vs sludge in gallbladder, no changes to suggest cholecystitis, umbilical hernia noted, and radiologist report reviewed below:  IMPRESSION: Cholelithiasis similar to that seen on recent ultrasound examination.   Stable 14 mm nodule in the right  lower lobe. No follow-up is recommended as described above.   Umbilical hernia containing fat and small bowel without complicating factors.   Assessment/Plan: (ICD-10's: K56.20) 57 y.o. female admitted with nausea/emesis/diarrhea likely secondary to gastroenteritis resulting in dehydration/AKI also found to have cholelithiasis without evidence of cholecystitis nor choledocholithiasis, complicated by pertinent comorbidities including history of CVA, CHF with EF 30%, T2DM.   - I do suspect her presentation and symptoms are secondary to gastroenteritis which has resulted in dehydration and AKI. She does of course have cholelithiasis but work up has been negative for cholecystitis nor choledocholithiasis and Murphy's Sign is negative on examination. Her WBC on admission likely secondary to dehydration and has resolved with IVF resuscitation. My suspicion for cholecystitis at this time is very low. Her comorbidities listed above also make her a sub-optimal surgical candidate. For now, would follow clinically. If pain worsens, develops WBC elevation/fever, can reconsider. Would likely pursue HIDA (which is already ordered overnight) to definitively rule in/out gallbladder. May consider stool studies to evaluate for gastroenteritis. We will follow along.   All of the above findings and  recommendations were discussed with the patient, and all of patient's questions were answered to her expressed satisfaction.  Thank you for the opportunity to participate in this patient's care.   -- Lynden Oxford, PA-C [facility] 06/02/2023, 7:40 AM M-F: 7am - 4pm

## 2023-06-02 NOTE — Assessment & Plan Note (Signed)
-   We will continue Toprol-XL and hold Cozaar given acute kidney injury.

## 2023-06-02 NOTE — Assessment & Plan Note (Signed)
-   The patient will be placed on supplemental coverage with NovoLog. - We will continue basal coverage. - We will hold off metformin.

## 2023-06-02 NOTE — Plan of Care (Signed)
  Problem: Education: Goal: Ability to describe self-care measures that may prevent or decrease complications (Diabetes Survival Skills Education) will improve Outcome: Progressing Goal: Individualized Educational Video(s) Outcome: Progressing   Problem: Coping: Goal: Ability to adjust to condition or change in health will improve Outcome: Progressing   

## 2023-06-02 NOTE — Assessment & Plan Note (Addendum)
-   I suspect viral etiology. - She could be having recurrent nausea and vomiting with her UTI due to possibly ascending UTI and pyelonephritis. - The patient will be hydrated with IV normal saline as mentioned above. - As needed antiemetic emetics and antidiarrheals will be given. - This could be the culprit for his elevated lipase level. - I doubt pancreatitis in her case. - We will still follow lipase levels.

## 2023-06-02 NOTE — ED Provider Notes (Signed)
1:55 AM  Assumed care at shift change.  Patient here with nausea, vomiting and diarrhea.  Found to have a new AKI.  Also has possible UTI.  Getting IV fluids, Rocephin.  CT of the abdomen pelvis and MRCP pending.  Both have been reviewed and interpreted by myself and radiologist and show gallstones without cholecystitis.  No common bile duct dilatation or choledocholithiasis.  Will discuss with hospitalist for admission.  2:30 AM  Consulted and discussed patient's case with hospitalist, Dr. Arville Care.  I have recommended admission and consulting physician agrees and will place admission orders.  Patient (and family if present) agree with this plan.   I reviewed all nursing notes, vitals, pertinent previous records.  All labs, EKGs, imaging ordered have been independently reviewed and interpreted by myself.    Clarise Chacko, Layla Maw, DO 06/02/23 0230

## 2023-06-02 NOTE — Progress Notes (Signed)
Anticoagulation monitoring(Lovenox):  57 yo  female ordered Lovenox 40 mg Q24h    Filed Weights   06/01/23 1800  Weight: 68 kg (150 lb)   BMI 29   Lab Results  Component Value Date   CREATININE 3.16 (H) 06/01/2023   CREATININE 1.27 (H) 04/07/2023   CREATININE 1.45 (H) 04/06/2023   Estimated Creatinine Clearance: 16.7 mL/min (A) (by C-G formula based on SCr of 3.16 mg/dL (H)). Hemoglobin & Hematocrit     Component Value Date/Time   HGB 14.3 06/01/2023 1805   HCT 42.8 06/01/2023 1805     Per Protocol for Patient with estCrcl < 30 ml/min and BMI < 30, will transition to Lovenox 30 mg Q24h.

## 2023-06-02 NOTE — Assessment & Plan Note (Signed)
-   This is likely prerenal due to volume depletion and dehydration from her acute gastroenteritis. - The patient will be admitted to a medical telemetry observation bed. - We will continue hydration with IV normal saline. - Will follow BMP. - We will avoid nephrotoxins.

## 2023-06-03 DIAGNOSIS — K8020 Calculus of gallbladder without cholecystitis without obstruction: Secondary | ICD-10-CM | POA: Diagnosis not present

## 2023-06-03 DIAGNOSIS — I69359 Hemiplegia and hemiparesis following cerebral infarction affecting unspecified side: Secondary | ICD-10-CM

## 2023-06-03 DIAGNOSIS — K2970 Gastritis, unspecified, without bleeding: Secondary | ICD-10-CM | POA: Diagnosis not present

## 2023-06-03 DIAGNOSIS — N179 Acute kidney failure, unspecified: Secondary | ICD-10-CM | POA: Diagnosis not present

## 2023-06-03 DIAGNOSIS — E860 Dehydration: Secondary | ICD-10-CM | POA: Diagnosis not present

## 2023-06-03 LAB — CBC
HCT: 33.1 % — ABNORMAL LOW (ref 36.0–46.0)
Hemoglobin: 11.4 g/dL — ABNORMAL LOW (ref 12.0–15.0)
MCH: 29.5 pg (ref 26.0–34.0)
MCHC: 34.4 g/dL (ref 30.0–36.0)
MCV: 85.8 fL (ref 80.0–100.0)
Platelets: 167 10*3/uL (ref 150–400)
RBC: 3.86 MIL/uL — ABNORMAL LOW (ref 3.87–5.11)
RDW: 13.1 % (ref 11.5–15.5)
WBC: 6.6 10*3/uL (ref 4.0–10.5)
nRBC: 0 % (ref 0.0–0.2)

## 2023-06-03 LAB — BASIC METABOLIC PANEL
Anion gap: 8 (ref 5–15)
BUN: 39 mg/dL — ABNORMAL HIGH (ref 6–20)
CO2: 28 mmol/L (ref 22–32)
Calcium: 9.5 mg/dL (ref 8.9–10.3)
Chloride: 102 mmol/L (ref 98–111)
Creatinine, Ser: 2.34 mg/dL — ABNORMAL HIGH (ref 0.44–1.00)
GFR, Estimated: 24 mL/min — ABNORMAL LOW (ref >60)
Glucose, Bld: 93 mg/dL (ref 70–99)
Potassium: 4.2 mmol/L (ref 3.5–5.1)
Sodium: 138 mmol/L (ref 135–145)

## 2023-06-03 LAB — URINE CULTURE

## 2023-06-03 LAB — GLUCOSE, CAPILLARY
Glucose-Capillary: 93 mg/dL (ref 70–99)
Glucose-Capillary: 109 mg/dL — ABNORMAL HIGH (ref 70–99)
Glucose-Capillary: 103 mg/dL — ABNORMAL HIGH (ref 70–99)
Glucose-Capillary: 118 mg/dL — ABNORMAL HIGH (ref 70–99)

## 2023-06-03 MED ORDER — ASPIRIN 81 MG PO TBEC
81.0000 mg | DELAYED_RELEASE_TABLET | Freq: Every day | ORAL | Status: DC
  Administered 2023-06-03 – 2023-06-04 (×2): 81 mg via ORAL
  Filled 2023-06-03 (×2): qty 1

## 2023-06-03 MED ORDER — METOPROLOL SUCCINATE ER 25 MG PO TB24
12.5000 mg | ORAL_TABLET | Freq: Every day | ORAL | Status: DC
  Administered 2023-06-04: 12.5 mg via ORAL
  Filled 2023-06-03: qty 1

## 2023-06-03 NOTE — Progress Notes (Signed)
Transition of Care [facility]) - Inpatient Brief Assessment   Patient Details  Name: Sarah Lane MRN: [id number] Date of Birth: Jul 16, 1966  [facility]) CM/SW Contact:    Truddie Hidden, RN Phone Number: 06/03/2023, 2:10 PM   Clinical Narrative: TOC continuing to follow patient's progress throughout discharge planning.   Transition of Care Asessment: Insurance and Status: Insurance coverage has been reviewed Patient has primary care physician: Yes Home environment has been reviewed: Home Prior level of function:: Independent Prior/Current Home Services: No current home services Social Determinants of Health Reivew: SDOH reviewed no interventions necessary Readmission risk has been reviewed: Yes Transition of care needs: no transition of care needs at this time

## 2023-06-03 NOTE — Progress Notes (Signed)
Progress Note   Patient: Sarah Lane NFA:[id number] DOB: 10-10-1965 DOA: 06/01/2023     1 DOS: the patient was seen and examined on 06/03/2023   Brief hospital course:  Sarah Lane is a 57 y.o. female with medical history significant for asthma, aortic atherosclerosis, type 2 diabetes mellitus, cardiomyopathy, ovarian cancer, CVA, hypertension, PSVT and seizure disorder, who presented to the emergency room with acute onset of intractable nausea and vomiting for the last 4 to 5 days with intermittent diarrhea.  She admitted to lower abdominal cramps with her diarrhea and epigastric pain. She denies any fever or chills.  No dyspnea or cough or wheezing.  No chest pain or palpitations.  No bilious vomitus or hematemesis.  No melena or bright red bleeding per rectum.  No presyncope or syncope.   ED Course: Upon presentation to the emergency room, vital signs were within normal.  Labs revealed BUN of 52 and creatinine 3.16 with calcium of 11.1 anion gap of 23 and chloride 91 with CO2 21.  Serum lipase was 132 and total protein 8.4.  Total bili was 1.8.  Lactic acid was 1.6.  CBC showed WBC of 10.8 and was otherwise unremarkable.  UA was positive for UTI. EKG as reviewed by me : none. Imaging: Abdominal pelvic CT scan revealed the following: Cholelithiasis similar to that seen on recent ultrasound examination.   Stable 14 mm nodule in the right lower lobe. No follow-up is recommended as described above.   Umbilical hernia containing fat and small bowel without complicating factors.   The patient was given 1 L bolus of IV lactated Ringer followed 125 mL/h and 1 g of IV Rocephin.  She will be admitted to a medical telemetry observation bed for further evaluation and management    Assessment and Plan:    Progress Note     Patient: Sarah Lane ION:[id number] DOB: [birth year] DOA: 06/01/2023     1 DOS: the patient was seen and examined on 06/03/2023   Brief hospital course:   Sarah Lane is a 57 y.o. female with medical history significant for asthma, aortic atherosclerosis, type 2 diabetes mellitus, cardiomyopathy, ovarian cancer, CVA, hypertension, PSVT and seizure disorder, who presented to the emergency room with acute onset of intractable nausea and vomiting for the last 4 to 5 days with intermittent diarrhea.  She admitted to lower abdominal cramps with her diarrhea and epigastric pain. She denies any fever or chills.  No dyspnea or cough or wheezing.  No chest pain or palpitations.  No bilious vomitus or hematemesis.  No melena or bright red bleeding per rectum.  No presyncope or syncope.   ED Course: Upon presentation to the emergency room, vital signs were within normal.  Labs revealed BUN of 52 and creatinine 3.16 with calcium of 11.1 anion gap of 23 and chloride 91 with CO2 21.  Serum lipase was 132 and total protein 8.4.  Total bili was 1.8.  Lactic acid was 1.6.  CBC showed WBC of 10.8 and was otherwise unremarkable.  UA was positive for UTI. EKG as reviewed by me : none. Imaging: Abdominal pelvic CT scan revealed the following: Cholelithiasis similar to that seen on recent ultrasound examination.   Stable 14 mm nodule in the right lower lobe. No follow-up is recommended as described above.   Umbilical hernia containing fat and small bowel without complicating factors.   The patient was given 1 L bolus of IV lactated Ringer followed 125 mL/h and 1 g  of IV Rocephin.  She will be admitted to a medical telemetry observation bed for further evaluation and management       Assessment and Plan:   * AKI (acute kidney injury) (HCC) superimposed on chronic kidney disease stage IIIa This is likely prerenal due to volume depletion GI losses related to nausea and vomiting Patient's baseline serum creatinine is about 1.45 and it was as high as 3.16 on admission With hydration serum creatinine is down to 2.43 Continue IV fluids over the next 24 hours Hold Cozaar      Acute gastritis Patient with epigastric pain, nausea and vomiting Continues to complain of nausea but has not had any further episodes of emesis.  Unable to tolerate regular diet yet Continue full liquid diet, will not advance at this time Supportive care with IV fluid hydration, antiemetics and IV PPI       Cholelithiasis Patient noted to have cholelithiasis with no evidence of acute cholecystitis Had a HIDA scan which shows no common duct or cystic duct obstruction. Essentially 0 gallbladder ejection fraction Follow-up with surgery as an outpatient     Essential hypertension Continue Toprol-XL  Cozaar is on hold due to AKI    Type 2 diabetes mellitus with complications of stage IIIa chronic kidney disease We will hold off metformin. Continue sliding scale coverage Continue full liquid diet     History of CVA with right-sided hemiparesis Continue aspirin and high intensity statin         Subjective: Patient is seen and examined at the bedside.  Continues to have nausea but no further episodes of emesis.  Physical Exam: Vitals:   06/02/23 1622 06/02/23 1938 06/03/23 0344 06/03/23 0832  BP: 120/65 110/75 126/72 136/66  Pulse: 76 74 71 71  Resp: 16 16 18 17   Temp: 98.4 F (36.9 C) 98.4 F (36.9 C) 98.2 F (36.8 C) 98.8 F (37.1 C)  TempSrc: Oral Oral Oral   SpO2: 98% 96% 98% 96%  Weight:      Height:       GENERAL:  57 y.o.-year-old patient lying in the bed with no acute distress.  EYES: Pupils equal, round, reactive to light and accommodation. No scleral icterus. Extraocular muscles intact.  HEENT: Head atraumatic, normocephalic. Oropharynx and nasopharynx clear.  NECK:  Supple, no jugular venous distention. No thyroid enlargement, no tenderness.  LUNGS: Normal breath sounds bilaterally, no wheezing, rales,rhonchi or crepitation. No use of accessory muscles of respiration.  CARDIOVASCULAR: Regular rate and rhythm, S1, S2 normal. No murmurs, rubs, or  gallops.  ABDOMEN: Soft, nondistended, with generalized tenderness mainly in epigastric areas and LUQ. Bowel sounds present. No organomegaly or mass.  EXTREMITIES: No pedal edema, cyanosis, or clubbing.  NEUROLOGIC: Cranial nerves II through XII are intact. Muscle strength 5/5 in all extremities. Sensation intact. Gait not checked.  PSYCHIATRIC: The patient is alert and oriented x 3.  Normal affect and good eye contact. SKIN: No obvious rash, lesion, or ulcer.   Data Reviewed: Labs reviewed.  Creatinine 2.34 down from 3.05 There are no new results to review at this time.  Family Communication: Plan of care discussed with patient in detail.  She verbalizes understanding and agrees with the plan.  Disposition: Status is: Inpatient Remains inpatient appropriate because: Unable to tolerate oral intake  Planned Discharge Destination:  TBD    Time spent: 34 minutes  Author: Lucile Shutters, MD 06/03/2023 2:48 PM  For on call review [email or web address].

## 2023-06-03 NOTE — Progress Notes (Signed)
[facility] SURGICAL PROGRESS NOTE (cpt [phone])  Hospital Day(s): 1.   Interval History: Patient seen and examined, no acute events or new complaints overnight. Patient reports she is feeling somewhat better. Abdominal crampy pain diffusely., No fever, chills, nausea, emesis. Remains without leukocytosis; 6.6K. Hgb to 11.4. HIDA yesterday on 10/23 was negative. Diet advanced to CLD; tolerating.   Review of Systems:  Constitutional: denies fever, chills  HEENT: denies cough or congestion  Respiratory: denies any shortness of breath  Cardiovascular: denies chest pain or palpitations  Gastrointestinal: + abdominal pain (improved, diffuse), denied N/V Genitourinary: denies burning with urination or urinary frequency Musculoskeletal: denies pain, decreased motor or sensation  Vital signs in last 24 hours: [min-max] current  Temp:  [98.2 F (36.8 C)-98.4 F (36.9 C)] 98.2 F (36.8 C) (10/24 0344) Pulse Rate:  [71-81] 71 (10/24 0344) Resp:  [15-22] 18 (10/24 0344) BP: (102-139)/(49-76) 126/72 (10/24 0344) SpO2:  [96 %-100 %] 98 % (10/24 0344)     Height: 5' (152.4 cm) Weight: 68 kg BMI (Calculated): 29.3   Intake/Output last 2 shifts:  No intake/output data recorded.   Physical Exam:  Constitutional: alert, cooperative and no distress  HENT: normocephalic without obvious abnormality  Eyes: PERRL, EOM's grossly intact and symmetric  Respiratory: breathing non-labored at rest  Cardiovascular: regular rate and sinus rhythm  Gastrointestinal: soft, non-tender, and non-distended Musculoskeletal: no edema or wounds, motor and sensation grossly intact, NT    Labs:     Latest Ref Rng & Units 06/03/2023    5:52 AM 06/02/2023    4:37 AM 06/01/2023    6:05 PM  CBC  WBC 4.0 - 10.5 K/uL 6.6  9.1  10.8   Hemoglobin 12.0 - 15.0 g/dL 10.2  72.5  36.6   Hematocrit 36.0 - 46.0 % 33.1  34.5  42.8   Platelets 150 - 400 K/uL 167  196  273       Latest Ref Rng & Units  06/02/2023    4:37 AM 06/01/2023    6:05 PM 04/07/2023    6:27 AM  CMP  Glucose 70 - 99 mg/dL 83  440  347   BUN 6 - 20 mg/dL 51  52  15   Creatinine 0.44 - 1.00 mg/dL 4.25  9.56  3.87   Sodium 135 - 145 mmol/L 133  135  140   Potassium 3.5 - 5.1 mmol/L 4.0  5.1  4.0   Chloride 98 - 111 mmol/L 94  91  104   CO2 22 - 32 mmol/L 24  21  26    Calcium 8.9 - 10.3 mg/dL 9.7  56.4  8.9   Total Protein 6.5 - 8.1 g/dL  8.4    Total Bilirubin 0.3 - 1.2 mg/dL  1.8    Alkaline Phos 38 - 126 U/L  49    AST 15 - 41 U/L  19    ALT 0 - 44 U/L  13       Imaging studies:   HIDA (06/02/2023) personally reviewed without evidence of cystic duct obstruction, and radiologist report reviewed below:  IMPRESSION: No common duct or cystic duct obstruction. Essentially 0 gallbladder ejection fraction   Assessment/Plan: (ICD-10's: K80.20) 57 y.o. female admitted with nausea/emesis/diarrhea likely secondary to gastroenteritis resulting in dehydration/AKI also found to have cholelithiasis without evidence of cholecystitis nor choledocholithiasis, complicated by pertinent comorbidities including history of CVA, CHF with EF 30%, T2DM.   - HIDA scan negative for cholecystitis. I do not think she  has cholecystitis currently given extensive negative work up, normal WBC, and lack of significant RUQ pain. I do not think she needs cholecystectomy at this time. We will be happy to follow up in 3-4 weeks as an outpatient to discuss cholelithiasis.   - Okay to advance diet from surgical perspective - Monitor abdominal examination; on-going bowel function   - Pain control prn; antiemetics prn   - Mobilize - Further management per primary service   - General surgery will sign off. I will update follow up and DC instructions from cholelithiasis perspective.   All of the above findings and recommendations were discussed with the patient, and the medical team, and all of patient's questions were answered to expressed  satisfaction.   -- Lynden Oxford, PA-C [facility] 06/03/2023, 7:20 AM M-F: 7am - 4pm

## 2023-06-03 NOTE — Plan of Care (Signed)
  Problem: Nutritional: Goal: Maintenance of adequate nutrition will improve Outcome: Progressing   

## 2023-06-04 DIAGNOSIS — N179 Acute kidney failure, unspecified: Secondary | ICD-10-CM | POA: Diagnosis not present

## 2023-06-04 LAB — CBC
HCT: 36.3 % (ref 36.0–46.0)
Hemoglobin: 12.4 g/dL (ref 12.0–15.0)
MCH: 29.2 pg (ref 26.0–34.0)
MCHC: 34.2 g/dL (ref 30.0–36.0)
MCV: 85.4 fL (ref 80.0–100.0)
Platelets: 180 10*3/uL (ref 150–400)
RBC: 4.25 MIL/uL (ref 3.87–5.11)
RDW: 13 % (ref 11.5–15.5)
WBC: 7 10*3/uL (ref 4.0–10.5)
nRBC: 0 % (ref 0.0–0.2)

## 2023-06-04 LAB — BASIC METABOLIC PANEL
Anion gap: 9 (ref 5–15)
BUN: 24 mg/dL — ABNORMAL HIGH (ref 6–20)
CO2: 27 mmol/L (ref 22–32)
Calcium: 9.4 mg/dL (ref 8.9–10.3)
Chloride: 103 mmol/L (ref 98–111)
Creatinine, Ser: 1.71 mg/dL — ABNORMAL HIGH (ref 0.44–1.00)
GFR, Estimated: 34 mL/min — ABNORMAL LOW (ref >60)
Glucose, Bld: 97 mg/dL (ref 70–99)
Potassium: 4.1 mmol/L (ref 3.5–5.1)
Sodium: 139 mmol/L (ref 135–145)

## 2023-06-04 LAB — GLUCOSE, CAPILLARY
Glucose-Capillary: 90 mg/dL (ref 70–99)
Glucose-Capillary: 114 mg/dL — ABNORMAL HIGH (ref 70–99)

## 2023-06-04 MED ORDER — GLUCERNA SHAKE PO LIQD
237.0000 mL | Freq: Three times a day (TID) | ORAL | Status: DC
  Administered 2023-06-04: 237 mL via ORAL

## 2023-06-04 MED ORDER — OMEPRAZOLE 40 MG PO CPDR: 40.0000 mg | DELAYED_RELEASE_CAPSULE | Freq: Every day | ORAL | 0 refills | Status: AC

## 2023-06-04 MED ORDER — OMEPRAZOLE 40 MG PO CPDR: 40.0000 mg | DELAYED_RELEASE_CAPSULE | Freq: Every day | ORAL | 0 refills | Status: DC

## 2023-06-04 NOTE — TOC Transition Note (Signed)
Transition of Care [facility]) - CM/SW Discharge Note   Patient Details  Name: Sarah Lane MRN: [id number] Date of Birth: Jul 27, 1966  [facility] CM/SW Contact:  Truddie Hidden, RN Phone Number: 06/04/2023, 3:57 PM   Clinical Narrative:    Patient needs a ride home. She is unable to pay and no one can transport her home. RNCM arranged [facility] Adas for [address] Nurse notified to call number on voucher when patient is ready for discharge.     Signing off.           Patient Goals and CMS Choice      Discharge Placement                         Discharge Plan and Services Additional resources added to the After Visit Summary for                                       Social Determinants of Health (SDOH) Interventions SDOH Screenings   Food Insecurity: No Food Insecurity (06/02/2023)  Housing: Low Risk  (06/02/2023)  Transportation Needs: No Transportation Needs (06/02/2023)  Utilities: Not At Risk (06/02/2023)  Financial Resource Strain: Medium Risk (05/17/2023)   Received from [facility]  Physical Activity: Insufficiently Active (05/17/2023)   Received from [facility]  Social Connections: Socially Isolated (05/18/2023)   Received from [facility]  Stress: No Stress Concern Present (05/18/2023)   Received from [facility]  Tobacco Use: Unknown (06/01/2023)  Health Literacy: Medium Risk (05/17/2023)   Received from [facility]     Readmission Risk Interventions     No data to display

## 2023-06-04 NOTE — Progress Notes (Signed)
Initial Nutrition Assessment  DOCUMENTATION CODES:   Not applicable  INTERVENTION:   -RD provided "Gallbladder Nutrition Therapy" handout from [facility] Nutrition Care Manual; attached to AVS/ discharge summary; reinforced importance of low fat diet and small, frequent meals -MVI with minerals daily -Glucerna Shake po TID, each supplement provides 220 kcal and 10 grams of protein   NUTRITION DIAGNOSIS:   Inadequate oral intake related to decreased appetite as evidenced by per patient/family report.  GOAL:   Patient will meet greater than or equal to 90% of their needs  MONITOR:   PO intake, Supplement acceptance  REASON FOR ASSESSMENT:   Consult Assessment of nutrition requirement/status  ASSESSMENT:   Pt with medical history significant for asthma, aortic atherosclerosis, type 2 diabetes mellitus, cardiomyopathy, ovarian cancer, CVA, hypertension, PSVT and seizure disorder, who presented with acute onset of intractable nausea and vomiting for the last 4 to 5 days PTA with intermittent diarrhea.  Pt admitted with AKI, acute gastritis, and cholelithiasis.   10/24- s/p HIDA- negative for cholecystitis  Reviewed I/O's: +270 ml x 24 hours and +1.5 L since admission   Case discussed with pharmacist, who reports pt is requesting RD visit.   Spoke with pt at bedside, who reports she is anxious about eating. Pt was just advanced to a heart healthy, carb modified diet for lunch. Observed pt consume a half of a half of a [place] and cheese sandwich without difficulty. Pt shares that she has a lot of anxiety about eating, as she is afraid that it will come back up. Per pt, she has been tolerating liquids well (po 50-100%), however, pt shares that her home diet has consisted of chicken soup and tomato soup for the past several months, as that is what she is able to keep down and reports that food often "doesn't taste right".   Pt admits that she needs to make wiser food choices and her  sister is very supportive of her. She is willing to do whatever it takes to help her recover. Per pt, she suffered a stroke approximately one year ago and has had a long recovery. Pt shares that she used to weigh over 200# and estimates she has lost about 150# in the past year secondary to both acute illness and side effects from stroke. Pt has limited use of the right side of her body secondary to stroke; RD assisted with setting up meal tray and opening up packages due to this. Reviewed wt hx; pt has experienced a 15.6% wt loss over the past 2 months, which is significant for time frame.   Discussed importance of good meal and supplement intake to promote healing. Discussed recommendations for home, as pt was requesting a food list for discharge. Discussed importance of low fat diet and ways to reduce fat in diet. Encouraged small, frequent meals in attempt to help increase oral intake. Pt also amenable to supplements.   Pt expressed concern about going home today due to fear to food coming back up. Reinforced importance of discharging from the hospital when medically ready, but shared concerns with RN and MD per pt request.   Medications reviewed and include calcium-vitamin D.   Lab Results  Component Value Date   HGBA1C 8.2 (H) 03/31/2023   PTA DM medications are 15 units insulin solostar daily and 1000 mg metformin BID.   Labs reviewed: CBGS: 90-118 (inpatient orders for glycemic control are 0-5 units insulin aspart daily at bedtime and 0-9 units insulin aspart TID with meals).  NUTRITION - FOCUSED PHYSICAL EXAM:  Flowsheet Row Most Recent Value  Orbital Region No depletion  Upper Arm Region Mild depletion  Thoracic and Lumbar Region No depletion  Buccal Region No depletion  Temple Region No depletion  Clavicle and Acromion Bone Region No depletion  Scapular Bone Region No depletion  Dorsal Hand No depletion  Patellar Region Mild depletion  Anterior Thigh Region Mild depletion   Posterior Calf Region Mild depletion  Edema (RD Assessment) None  Hair Reviewed  Eyes Reviewed  Mouth Reviewed  Skin Reviewed  Nails Reviewed       Diet Order:   Diet Order             Diet heart healthy/carb modified Room service appropriate? Yes; Fluid consistency: Thin  Diet effective now                   EDUCATION NEEDS:   Education needs have been addressed  Skin:  Skin Assessment: Reviewed RN Assessment  Last BM:  06/02/23  Height:   Ht Readings from Last 1 Encounters:  06/01/23 5' (1.524 m)    Weight:   Wt Readings from Last 1 Encounters:  06/01/23 68 kg    Ideal Body Weight:  45.5 kg  BMI:  Body mass index is 29.29 kg/m.  Estimated Nutritional Needs:   Kcal:  1700-1900  Protein:  100-115 grams  Fluid:  > 1.7 L    Levada Schilling, RD, LDN, CDCES Registered Dietitian III Certified Diabetes Care and Education Specialist Please refer to [facility] for RD and/or RD on-call/weekend/after hours pager

## 2023-06-04 NOTE — Discharge Summary (Signed)
Physician Discharge Summary   Patient: Sarah Lane MRN: [id number] DOB: Dec 23, 1965  Admit date:     06/01/2023  Discharge date: 06/04/23  Discharge Physician: Jackline Castilla   PCP: [facility], [facility]   Recommendations at discharge:   Check blood sugars daily Keep scheduled follow-up appointment with surgery  Discharge Diagnoses: Principal Problem:   AKI (acute kidney injury) (HCC) Active Problems:   Acute gastritis   Type 2 diabetes mellitus without complications (HCC)   Cholelithiasis   Dyslipidemia   Essential hypertension   CVA, old, hemiparesis (HCC)  Resolved Problems:   * No resolved hospital problems. *  Hospital Course:  Sarah Lane is a 57 y.o. female with medical history significant for asthma, aortic atherosclerosis, type 2 diabetes mellitus, cardiomyopathy, ovarian cancer, CVA, hypertension, PSVT and seizure disorder, who presented to the emergency room with acute onset of intractable nausea and vomiting for the last 4 to 5 days with intermittent diarrhea.  She admitted to lower abdominal cramps with her diarrhea and epigastric pain. She denies any fever or chills.  No dyspnea or cough or wheezing.  No chest pain or palpitations.  No bilious vomitus or hematemesis.  No melena or bright red bleeding per rectum.  No presyncope or syncope.   ED Course: Upon presentation to the emergency room, vital signs were within normal.  Labs revealed BUN of 52 and creatinine 3.16 with calcium of 11.1 anion gap of 23 and chloride 91 with CO2 21.  Serum lipase was 132 and total protein 8.4.  Total bili was 1.8.  Lactic acid was 1.6.  CBC showed WBC of 10.8 and was otherwise unremarkable.  UA was positive for UTI. EKG as reviewed by me : none. Imaging: Abdominal pelvic CT scan revealed the following: Cholelithiasis similar to that seen on recent ultrasound examination.   Stable 14 mm nodule in the right lower lobe. No follow-up is recommended as described above.   Umbilical  hernia containing fat and small bowel without complicating factors.   The patient was given 1 L bolus of IV lactated Ringer followed 125 mL/h and 1 g of IV Rocephin.  She will be admitted to a medical telemetry observation bed for further evaluation and management    Assessment and Plan:  * AKI (acute kidney injury) (HCC) superimposed on chronic kidney disease stage IIIa This is likely prerenal due to volume depletion from GI losses related to nausea and vomiting Patient's baseline serum creatinine is about 1.45 and it was as high as 3.16 on admission Serum creatinine improved to 1.7 on discharge Encourage oral fluid intake Resume Cozaar       Acute gastritis Patient with epigastric pain, nausea and vomiting Nausea and vomiting has improved and patient able to tolerate regular diet Patient advised to be on a low-fat diet Will discharge home on omeprazole 40 mg daily          Cholelithiasis Patient noted to have cholelithiasis with no evidence of acute cholecystitis Had a HIDA scan which shows no common duct or cystic duct obstruction. Essentially 0 gallbladder ejection fraction Follow-up with surgery as an outpatient       Essential hypertension Continue Toprol-XL and Cozaar     Type 2 diabetes mellitus with complications of stage IIIa chronic kidney disease Continue metformin. Maintain consistent carbohydrate diet Check blood sugars daily       History of CVA with right-sided hemiparesis Continue aspirin and high intensity statin  Consultants: Surgery Procedures performed: HIDA scan, MRCP Disposition: Home Diet recommendation:  Discharge Diet Orders (From admission, onward)     Start     Ordered   06/04/23 0000  Diet - low sodium heart healthy        06/04/23 1514   06/04/23 0000  Diet Carb Modified        06/04/23 1514           Cardiac and Carb modified diet DISCHARGE MEDICATION: Allergies as of 06/04/2023       Reactions    Jardiance [empagliflozin]    Multiple side effect complications including: DKA, pancreatitis, renal failure. Avoid re-prescribing   Codeine Nausea And Vomiting   Ibuprofen Nausea And Vomiting        Medication List     STOP taking these medications    Lantus SoloStar 100 UNIT/ML Solostar Pen Generic drug: insulin glargine       TAKE these medications    aspirin 81 MG chewable tablet Chew 81 mg by mouth daily.   atorvastatin 80 MG tablet Commonly known as: LIPITOR Take 80 mg by mouth daily.   calcium-vitamin D 500-200 MG-UNIT tablet Commonly known as: OSCAL WITH D Take 1 tablet by mouth 2 (two) times daily.   furosemide 40 MG tablet Commonly known as: LASIX Take 1 tablet (40 mg total) by mouth daily.   losartan 25 MG tablet Commonly known as: COZAAR Take 1 tablet (25 mg total) by mouth daily. What changed: how much to take   metFORMIN 1000 MG tablet Commonly known as: GLUCOPHAGE Take 1,000 mg by mouth 2 (two) times daily with a meal.   metoprolol succinate 25 MG 24 hr tablet Commonly known as: TOPROL-XL Take 12.5 mg by mouth daily.   multivitamin with minerals Tabs tablet Take 1 tablet by mouth daily.   omeprazole 40 MG capsule Commonly known as: PRILOSEC Take 1 capsule (40 mg total) by mouth daily.        Follow-up Information     Campbell Lerner, MD. Schedule an appointment as soon as possible for a visit in 1 month(s).   Specialty: General Surgery Why: hospital follow up; cholelithiasis Contact information: [address] [place] [address] [phone]         [facility] Follow up in 1 week(s).   Specialty: Family Medicine Contact information: [address] [place] [address] [phone]                Discharge Exam: Filed Weights   06/01/23 1800  Weight: 68 kg   GENERAL:  57 y.o.-year-old patient lying in the bed with no acute distress.  EYES: Pupils equal, round, reactive to light  and accommodation. No scleral icterus. Extraocular muscles intact.  HEENT: Head atraumatic, normocephalic. Oropharynx and nasopharynx clear.  NECK:  Supple, no jugular venous distention. No thyroid enlargement, no tenderness.  LUNGS: Normal breath sounds bilaterally, no wheezing, rales,rhonchi or crepitation. No use of accessory muscles of respiration.  CARDIOVASCULAR: Regular rate and rhythm, S1, S2 normal. No murmurs, rubs, or gallops.  ABDOMEN: Soft, nondistended, improved tenderness mainly in epigastric areas and LUQ. Bowel sounds present. No organomegaly or mass.  EXTREMITIES: No pedal edema, cyanosis, or clubbing.  NEUROLOGIC: Cranial nerves II through XII are intact. Muscle strength 5/5 in all extremities. Sensation intact. Gait not checked.  PSYCHIATRIC: The patient is alert and oriented x 3.  Normal affect and good eye contact. SKIN: No obvious rash, lesion, or ulcer.  Condition at discharge: stable  The results of significant diagnostics from this hospitalization (including imaging, microbiology, ancillary and laboratory) are listed below for reference.   Imaging Studies: NM Hepato W/EF  Result Date: 06/02/2023 CLINICAL DATA:  Biliary dyskinesia EXAM: NUCLEAR MEDICINE HEPATOBILIARY IMAGING WITH GALLBLADDER EF TECHNIQUE: Sequential images of the abdomen were obtained out to 60 minutes following intravenous administration of radiopharmaceutical. After oral ingestion of Ensure, gallbladder ejection fraction was determined. RADIOPHARMACEUTICALS:  5.46 mCi Tc-37m  Choletec IV COMPARISON:  None Available. FINDINGS: Prompt uptake and biliary excretion of activity by the liver is seen. Gallbladder activity is visualized, consistent with patency of cystic duct. Biliary activity passes into small bowel, consistent with patent common bile duct. Calculated gallbladder ejection fraction is almost 0. (normal gallbladder ejection fraction with Ensure is greater than 33% and less than 80%.)  IMPRESSION: No common duct or cystic duct obstruction. Essentially 0 gallbladder ejection fraction Electronically Signed   By: Karen Kays M.D.   On: 06/02/2023 15:22   MR ABDOMEN MRCP WO CONTRAST  Result Date: 06/02/2023 CLINICAL DATA:  Right upper quadrant abdominal pain. Gallstones, sludge, and positive Murphy's sign on ultrasound. EXAM: MRI ABDOMEN WITHOUT CONTRAST  (INCLUDING MRCP) TECHNIQUE: Multiplanar multisequence MR imaging of the abdomen was performed. Heavily T2-weighted images of the biliary and pancreatic ducts were obtained, and three-dimensional MRCP images were rendered by post processing. COMPARISON:  Ultrasound abdomen 06/01/2023. CT abdomen and pelvis 04/01/2023 FINDINGS: Lower chest: Right lower lobe pulmonary nodule measuring 1.2 cm diameter. No change since prior CT scans dating back to 09/23/2010. Long-term stability suggests benign etiology. Hepatobiliary: Homogeneous normal appearance of the liver parenchyma. No focal liver lesions identified. No intrahepatic or extrahepatic bile duct dilatation. No common duct stones are identified. Layering sludge in the gallbladder. No discrete stones are demonstrated. No gallbladder wall thickening or pericholecystic edema. No inflammatory stranding is demonstrated. Pancreas: No mass, inflammatory changes, or other parenchymal abnormality identified. Spleen:  Within normal limits in size and appearance. Adrenals/Urinary Tract: No adrenal gland nodules. Bilateral renal cysts, measuring up to 1.9 cm diameter. No change since previous CT scans. No imaging follow-up is indicated. No hydronephrosis. Stomach/Bowel: Visualized portions within the abdomen are unremarkable. Vascular/Lymphatic: No pathologically enlarged lymph nodes identified. No abdominal aortic aneurysm demonstrated. Other:  No free fluid in the abdomen. Musculoskeletal: No suspicious bone lesions identified. Old appearing compression deformities in the lumbar vertebrae. IMPRESSION: 1.  Layering sludge in the gallbladder. No gallbladder wall thickening or inflammatory stranding identified. 2. No bile duct dilatation.  No common duct stones demonstrated. 3. Stable appearance of right lower lung nodule and bilateral renal cysts. No imaging follow-up of these items is indicated. Electronically Signed   By: Burman Nieves M.D.   On: 06/02/2023 01:43   MR 3D Recon At Scanner  Result Date: 06/02/2023 CLINICAL DATA:  Right upper quadrant abdominal pain. Gallstones, sludge, and positive Murphy's sign on ultrasound. EXAM: MRI ABDOMEN WITHOUT CONTRAST  (INCLUDING MRCP) TECHNIQUE: Multiplanar multisequence MR imaging of the abdomen was performed. Heavily T2-weighted images of the biliary and pancreatic ducts were obtained, and three-dimensional MRCP images were rendered by post processing. COMPARISON:  Ultrasound abdomen 06/01/2023. CT abdomen and pelvis 04/01/2023 FINDINGS: Lower chest: Right lower lobe pulmonary nodule measuring 1.2 cm diameter. No change since prior CT scans dating back to 09/23/2010. Long-term stability suggests benign etiology. Hepatobiliary: Homogeneous normal appearance of the liver parenchyma. No focal liver lesions identified. No intrahepatic or extrahepatic bile duct dilatation. No common duct stones are identified. Layering  sludge in the gallbladder. No discrete stones are demonstrated. No gallbladder wall thickening or pericholecystic edema. No inflammatory stranding is demonstrated. Pancreas: No mass, inflammatory changes, or other parenchymal abnormality identified. Spleen:  Within normal limits in size and appearance. Adrenals/Urinary Tract: No adrenal gland nodules. Bilateral renal cysts, measuring up to 1.9 cm diameter. No change since previous CT scans. No imaging follow-up is indicated. No hydronephrosis. Stomach/Bowel: Visualized portions within the abdomen are unremarkable. Vascular/Lymphatic: No pathologically enlarged lymph nodes identified. No abdominal aortic  aneurysm demonstrated. Other:  No free fluid in the abdomen. Musculoskeletal: No suspicious bone lesions identified. Old appearing compression deformities in the lumbar vertebrae. IMPRESSION: 1. Layering sludge in the gallbladder. No gallbladder wall thickening or inflammatory stranding identified. 2. No bile duct dilatation.  No common duct stones demonstrated. 3. Stable appearance of right lower lung nodule and bilateral renal cysts. No imaging follow-up of these items is indicated. Electronically Signed   By: Burman Nieves M.D.   On: 06/02/2023 01:43   CT ABDOMEN PELVIS WO CONTRAST  Result Date: 06/02/2023 CLINICAL DATA:  Nausea and vomiting with right-sided abdominal pain, initial encounter EXAM: CT ABDOMEN AND PELVIS WITHOUT CONTRAST TECHNIQUE: Multidetector CT imaging of the abdomen and pelvis was performed following the standard protocol without IV contrast. RADIATION DOSE REDUCTION: This exam was performed according to the departmental dose-optimization program which includes automated exposure control, adjustment of the mA and/or kV according to patient size and/or use of iterative reconstruction technique. COMPARISON:  Ultrasound from earlier in the same day. FINDINGS: Lower chest: 14 mm rounded nodule is noted in the right lower lobe stable in appearance from the prior exam. No further follow-up is noted as this is been present since 20/12 and shows no activity on prior PET-CT. No new focal nodule is seen. Hepatobiliary: Liver is within normal limits. Gallbladder is well distended with dependent density consistent with stones similar to that seen on prior ultrasound examination. Pancreas: Unremarkable. No pancreatic ductal dilatation or surrounding inflammatory changes. Spleen: Normal in size without focal abnormality. Adrenals/Urinary Tract: Adrenal glands are within normal limits. Kidneys demonstrate no renal calculi or obstructive changes. A simple appearing cyst is noted in the right kidney  stable from the prior study. No further follow-up is recommended. The bladder is decompressed. Stomach/Bowel: The appendix is within normal limits. No obstructive or inflammatory changes of colon are seen. Stomach is unremarkable. No small bowel abnormality is seen. Vascular/Lymphatic: Aortic atherosclerosis. No enlarged abdominal or pelvic lymph nodes. Reproductive: Status post hysterectomy. No adnexal masses. Other: Small umbilical hernia is noted containing fat and a single knuckle of small bowel. No obstructive changes are seen. The overall appearance is stable. Musculoskeletal: No acute or significant osseous findings. IMPRESSION: Cholelithiasis similar to that seen on recent ultrasound examination. Stable 14 mm nodule in the right lower lobe. No follow-up is recommended as described above. Umbilical hernia containing fat and small bowel without complicating factors. Electronically Signed   By: Alcide Clever M.D.   On: 06/02/2023 01:19   US ABDOMEN LIMITED RUQ (LIVER/GB)  Result Date: 06/01/2023 CLINICAL DATA:  Right upper quadrant and epigastric pain. Nausea and vomiting. Concern for cholecystitis. EXAM: ULTRASOUND ABDOMEN LIMITED RIGHT UPPER QUADRANT COMPARISON:  CT 04/01/2023 FINDINGS: Gallbladder: Physiologically distended. Layering echogenic material likely represents a combination of sludge and small stones. No gallbladder wall thickening. Positive sonographic Murphy sign noted by sonographer. Common bile duct: Diameter: 6 mm, normal. Liver: No focal lesion identified. Within normal limits in parenchymal echogenicity. Portal vein is patent  on color Doppler imaging with normal direction of blood flow towards the liver. Other: No right upper quadrant ascites. IMPRESSION: 1. Layering sludge and stones in the gallbladder without wall thickening or sonographic findings of acute cholecystitis. However, sonographer reports a positive sonographic Murphy sign. Consider further assessment with nuclear medicine  HIDA scan as clinically indicated. 2. No biliary dilatation. Electronically Signed   By: Narda Rutherford M.D.   On: 06/01/2023 22:36    Microbiology: Results for orders placed or performed during the hospital encounter of 06/01/23  Urine Culture     Status: Abnormal   Collection Time: 06/01/23  6:05 PM   Specimen: Urine, Random  Result Value Ref Range Status   Specimen Description   Final    URINE, RANDOM Performed at [facility], [address] [place], [place] [address]    Special Requests   Final    NONE Performed at [facility], [address] [place], [place] [address]    Culture MULTIPLE SPECIES PRESENT, SUGGEST RECOLLECTION (A)  Final   Report Status 06/03/2023 FINAL  Final    Labs: CBC: Recent Labs  Lab 06/01/23 1805 06/02/23 0437 06/03/23 0552 06/04/23 0640  WBC 10.8* 9.1 6.6 7.0  HGB 14.3 11.6* 11.4* 12.4  HCT 42.8 34.5* 33.1* 36.3  MCV 87.2 86.7 85.8 85.4  PLT 273 196 167 180   Basic Metabolic Panel: Recent Labs  Lab 06/01/23 1805 06/02/23 0437 06/03/23 0552 06/04/23 0640  NA 135 133* 138 139  K 5.1 4.0 4.2 4.1  CL 91* 94* 102 103  CO2 21* 24 28 27   GLUCOSE 123* 83 93 97  BUN 52* 51* 39* 24*  CREATININE 3.16* 3.05* 2.34* 1.71*  CALCIUM 11.1* 9.7 9.5 9.4   Liver Function Tests: Recent Labs  Lab 06/01/23 1805  AST 19  ALT 13  ALKPHOS 49  BILITOT 1.8*  PROT 8.4*  ALBUMIN 4.8   CBG: Recent Labs  Lab 06/03/23 1146 06/03/23 1619 06/03/23 2029 06/04/23 0811 06/04/23 1137  GLUCAP 109* 103* 118* 90 114*    Discharge time spent: greater than 30 minutes.  Signed: Lucile Shutters, MD [facility] 06/04/2023

## 2023-06-04 NOTE — Evaluation (Signed)
Physical Therapy Evaluation Patient Details Name: Sarah Lane MRN: [id number] DOB: [birth year] Today's Date: 06/04/2023  History of Present Illness  Pt is a 57 year old female admitted 06/01/23 for AKI. PmHx includes: type II diabetes mellitus, seizures, HTN, and CVA in June 2024 resulting in Right sided weakness.    Clinical Impression  Pt received in bed with MD at bedside and agreed to PT session. Bed mobility supine>sit independently. While at EOB pt does not report any s/sx relative to dizziness. Pt performed STS with the use of RW (2wheels) supervision and amb to hallway prior to ending session in bed. Returning to bed sit>supine MinA necessary for repositioning via scoots. Vitals at the beginning of session read: 98% Spo2 on RA, 76 bpm, and 118/94 mmHg while seated EOB. Vitals at the end of session read: 100% Spo2 on RA, 77 bpm, and 135/83 mmHg while seated EOB. Pt did not report any s/sx relative to dizziness. Communication throughout session entailed delayed responses as pt presented with a difficult time expressing what they wanted say. Pt additionally expressed concerns regarding home life as they do not appreciate the restrictions they are under while living with their roommate. Pt stated not being able to use personal rollator due to roommate's refusal and states that they become lonely when left in house for hours. Pt is in process of finding new housing following D/C. Pt appears to be close to baseline. Pt no longer benefits from skilled PT sessions, deferred to mobility. Please refer if things change.     If plan is discharge home, recommend the following: A little help with walking and/or transfers;Help with stairs or ramp for entrance   Can travel by private vehicle        Equipment Recommendations Rolling walker (2 wheels)  Recommendations for Other Services       Functional Status Assessment Patient has not had a recent decline in their functional status      Precautions / Restrictions Precautions Precautions: Fall Restrictions Weight Bearing Restrictions: No      Mobility  Bed Mobility Overal bed mobility: Needs Assistance Bed Mobility: Supine to Sit, Sit to Supine     Supine to sit: Independent Sit to supine: Min assist   General bed mobility comments: Pt is able to perform bed mobility independently. However, assistance was needed with bed repositioning/scoots.    Transfers Overall transfer level: Needs assistance Equipment used: Rolling walker (2 wheels) Transfers: Sit to/from Stand Sit to Stand: Supervision           General transfer comment: Pt performed STS at EOB with the use of RW (2wheels) supervision.    Ambulation/Gait Ambulation/Gait assistance: Contact guard assist Gait Distance (Feet): 100 Feet Assistive device: Rolling walker (2 wheels) Gait Pattern/deviations: Step-through pattern Gait velocity: WNL     General Gait Details: Pt performed amb with the use of RW (2wheels) CGA.  Stairs            Wheelchair Mobility     Tilt Bed    Modified Rankin (Stroke Patients Only)       Balance Overall balance assessment: Needs assistance Sitting-balance support: Feet supported Sitting balance-Leahy Scale: Good     Standing balance support: No upper extremity supported Standing balance-Leahy Scale: Good                               Pertinent Vitals/Pain Pain Assessment Pain Assessment: No/denies pain  Home Living Family/patient expects to be discharged to:: Private residence Living Arrangements: Non-relatives/Friends Available Help at Discharge: Friend(s) Type of Home: Mobile home Home Access: Ramped entrance       Home Layout: One level Home Equipment: Rollator (4 wheels);BSC/3in1;Cane - single point      Prior Function Prior Level of Function : Needs assist       Physical Assist : ADLs (physical);Mobility (physical) Mobility (physical): Gait ADLs (physical):  IADLs Mobility Comments: Pt reports independent amb due to decreased access to personal AD. However, pt expresses that they feel more comfortable when using AD.       Extremity/Trunk Assessment   Upper Extremity Assessment Upper Extremity Assessment: Overall WFL for tasks assessed    Lower Extremity Assessment Lower Extremity Assessment: Overall WFL for tasks assessed       Communication   Communication Communication: Difficulty communicating thoughts/reduced clarity of speech Cueing Techniques: Verbal cues  Cognition Arousal: Alert Behavior During Therapy: WFL for tasks assessed/performed Overall Cognitive Status: Within Functional Limits for tasks assessed                                 General Comments: Pt pleasant and willing to participate in PT session. With communication, pt presents with difficult time expressing what they are trying to say. Pt reports they often forget words and have a hard time finishing their thought.        General Comments      Exercises     Assessment/Plan    PT Assessment Patient does not need any further PT services  PT Problem List         PT Treatment Interventions      PT Goals (Current goals can be found in the Care Plan section)  Acute Rehab PT Goals Patient Stated Goal: Pt goals not stated PT Goal Formulation: With patient Time For Goal Achievement: 06/04/23 Potential to Achieve Goals: Good    Frequency       Co-evaluation               AM-PAC PT "6 Clicks" Mobility  Outcome Measure Help needed turning from your back to your side while in a flat bed without using bedrails?: A Little Help needed moving from lying on your back to sitting on the side of a flat bed without using bedrails?: A Little Help needed moving to and from a bed to a chair (including a wheelchair)?: A Little Help needed standing up from a chair using your arms (e.g., wheelchair or bedside chair)?: None Help needed to walk in  hospital room?: A Little Help needed climbing 3-5 steps with a railing? : A Little 6 Click Score: 19    End of Session Equipment Utilized During Treatment: Gait belt Activity Tolerance: Patient tolerated treatment well Patient left: in bed;with call bell/phone within reach Nurse Communication: Mobility status PT Visit Diagnosis: Other abnormalities of gait and mobility (R26.89);Muscle weakness (generalized) (M62.81);Difficulty in walking, not elsewhere classified (R26.2)    Time: 4034-7425 PT Time Calculation (min) (ACUTE ONLY): 21 min   Charges:   PT Evaluation $PT Eval Low Complexity: 1 Low PT Treatments $Gait Training: 8-22 mins PT General Charges $$ ACUTE PT VISIT: 1 Visit         [place] Silveria Sauvignon Howard SPT, LAT, ATC  Zarrah Loveland Sauvignon-Howard 06/04/2023, 1:55 PM

## 2023-06-04 NOTE — Discharge Instructions (Signed)
Gallbladder Nutrition Therapy   This eating plan will help you ease symptoms and make sure you eat enough healthful food before you have surgery.  Foods Recommended Eat small, frequent meals and snacks. Limit fat to less than 30% of your total daily calories. Ask your registered dietitian what your goal should be. Choose foods that are low in fat. Read labels and look for foods that have 3 grams of fat or less per serving.  Foods Not Recommended High-fat foods Fried foods Foods with strong odors Foods that cause gas  Gallbladder Sample 1-Day Menu View Nutrient Info Breakfast 1/2 cup oatmeal 1 cup 1% milk 2 slices whole wheat toast 2 teaspoon jam 1 teaspoon margarine, soft, tub 1/2 cup orange juice  Lunch 1 cup chicken noodle soup 2 slices whole wheat bread 1 teaspoon mayonnaise 8 baby carrots 1 apple 1 cup 1% milk  Evening Meal 3 ounce lean roast beef 1 cup potato 1/2 cup green beans 1 whole-wheat dinner roll 2 teaspoon margarine, soft, tub 1 orange 1/2 cup pudding, fat free  Evening Snack 1/2 cup pretzels     Gallbladder Vegan Sample 1-Day Menu  Breakfast 2 slices whole wheat toast 2 tablespoons peanut butter 1 tangerine 1 cup soymilk fortified with calcium, vitamin B12, and vitamin D  Lunch 1 cup tomato soup 5 saltine crackers 1 cup carrots 1 apple 1 cup soymilk fortified with calcium, vitamin B12, and vitamin D  Evening Meal 1 cup spaghetti  cup marinara sauce Salad made with: 1 cup spinach leaves 8 cherry tomatoes  cup red peppers 2 tablespoons balsamic vinegar 1 whole wheat dinner roll 1 teaspoon margarine, soft, tub 1 orange  Evening Snack 2 tablespoons almonds     Gallbladder Vegetarian (Lacto-Ovo) Sample 1-Day Menu  Breakfast 1 cup oatmeal  cup walnuts 1 miniature box raisins 1 cup 1% milk  Morning Snack 6 ounces low-fat yogurt  cup grapes  Lunch 2 slices whole wheat bread 1 slice low-fat cheddar cheese 2 tablespoons hummus  cup  carrots 12 cherries 1 cup 1% milk  Evening Meal Stir fry made with:  cup tofu  cup mushrooms  cup peas 1 sweet potato 1 tablespoon soy sauce  tablespoon sesame oil  Evening Snack 1 orange  Copyright 2024  Academy of [employer] Dietetics. All rights reserved

## 2023-06-09 DIAGNOSIS — Z1231 Encounter for screening mammogram for malignant neoplasm of breast: Secondary | ICD-10-CM | POA: Diagnosis not present

## 2023-06-10 NOTE — Progress Notes (Signed)
Was contacted by person stating they were Sarah Lane. Said she had been told to follow up with a surgeon but did not know who. Told her I would find out the information. The [facility] had Dr. Claudine Mouton. Called the office and explained the situation. They said they would call her to set up for follow up.

## 2023-06-17 ENCOUNTER — Ambulatory Visit: Payer: 59 | Admitting: Surgery

## 2023-06-22 ENCOUNTER — Telehealth: Payer: Self-pay | Admitting: Surgery

## 2023-06-22 ENCOUNTER — Encounter: Payer: Self-pay | Admitting: Surgery

## 2023-06-22 ENCOUNTER — Ambulatory Visit: Payer: Self-pay | Admitting: Surgery

## 2023-06-22 ENCOUNTER — Ambulatory Visit (INDEPENDENT_AMBULATORY_CARE_PROVIDER_SITE_OTHER): Payer: 59 | Admitting: Surgery

## 2023-06-22 VITALS — BP 136/83 | HR 76 | Temp 98.2°F | Ht 60.0 in | Wt 144.0 lb

## 2023-06-22 DIAGNOSIS — K8010 Calculus of gallbladder with chronic cholecystitis without obstruction: Secondary | ICD-10-CM

## 2023-06-22 HISTORY — DX: Calculus of gallbladder with chronic cholecystitis without obstruction: K80.10
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
## 2023-06-22 NOTE — Patient Instructions (Addendum)
Do not take anymore Metformin today. Bring all of your medication to the hospital, do not take any in the morning before surgery.  Nothing to eat or drink after midnight tonight.   You have requested to have your gallbladder removed. This will be done at [facility] with Dr. Claudine Mouton.  You will most likely be out of work 1-2 weeks for this surgery.  If you have FMLA or disability paperwork that needs filled out you may drop this off at our office or this can be faxed to [phone].  You will return after your post-op appointment with a lifting restriction for approximately 4 more weeks.  You will be able to eat anything you would like to following surgery. But, start by eating a bland diet and advance this as tolerated. The Gallbladder diet is below, please go as closely by this diet as possible prior to surgery to avoid any further attacks.  Please see the (blue)pre-care form that you have been given today. Our surgery scheduler will call you to verify surgery date and to go over information.   If you have any questions, please call our office.  Laparoscopic Cholecystectomy Laparoscopic cholecystectomy is surgery to remove the gallbladder. The gallbladder is located in the upper right part of the abdomen, behind the liver. It is a storage sac for bile, which is produced in the liver. Bile aids in the digestion and absorption of fats. Cholecystectomy is often done for inflammation of the gallbladder (cholecystitis). This condition is usually caused by a buildup of gallstones (cholelithiasis) in the gallbladder. Gallstones can block the flow of bile, and that can result in inflammation and pain. In severe cases, emergency surgery may be required. If emergency surgery is not required, you will have time to prepare for the procedure. Laparoscopic surgery is an alternative to open surgery. Laparoscopic surgery has a shorter recovery time. Your common bile duct may also need to be examined  during the procedure. If stones are found in the common bile duct, they may be removed. LET [facility] CARE PROVIDER KNOW ABOUT: Any allergies you have. All medicines you are taking, including vitamins, herbs, eye drops, creams, and over-the-counter medicines. Previous problems you or members of your family have had with the use of anesthetics. Any blood disorders you have. Previous surgeries you have had.  Any medical conditions you have. RISKS AND COMPLICATIONS Generally, this is a safe procedure. However, problems may occur, including: Infection. Bleeding. Allergic reactions to medicines. Damage to other structures or organs. A stone remaining in the common bile duct. A bile leak from the cyst duct that is clipped when your gallbladder is removed. The need to convert to open surgery, which requires a larger incision in the abdomen. This may be necessary if your surgeon thinks that it is not safe to continue with a laparoscopic procedure. BEFORE THE PROCEDURE Ask your health care provider about: Changing or stopping your regular medicines. This is especially important if you are taking diabetes medicines or blood thinners. Taking medicines such as aspirin and ibuprofen. These medicines can thin your blood. Do not take these medicines before your procedure if your health care provider instructs you not to. Follow instructions from your health care provider about eating or drinking restrictions. Let your health care provider know if you develop a cold or an infection before surgery. Plan to have someone take you home after the procedure. Ask your health care provider how your surgical site will be marked or identified. You may  be given antibiotic medicine to help prevent infection. PROCEDURE To reduce your risk of infection: Your health care team will wash or sanitize their hands. Your skin will be washed with soap. An IV tube may be inserted into one of your veins. You will be given  a medicine to make you fall asleep (general anesthetic). A breathing tube will be placed in your mouth. The surgeon will make several small cuts (incisions) in your abdomen. A thin, lighted tube (laparoscope) that has a tiny camera on the end will be inserted through one of the small incisions. The camera on the laparoscope will send a picture to a TV screen (monitor) in the operating room. This will give the surgeon a good view inside your abdomen. A gas will be pumped into your abdomen. This will expand your abdomen to give the surgeon more room to perform the surgery. Other tools that are needed for the procedure will be inserted through the other incisions. The gallbladder will be removed through one of the incisions. After your gallbladder has been removed, the incisions will be closed with stitches (sutures), staples, or skin glue. Your incisions may be covered with a bandage (dressing). The procedure may vary among health care providers and hospitals. AFTER THE PROCEDURE Your blood pressure, heart rate, breathing rate, and blood oxygen level will be monitored often until the medicines you were given have worn off. You will be given medicines as needed to control your pain.   This information is not intended to replace advice given to you by your health care provider. Make sure you discuss any questions you have with your health care provider.   Document Released: 07/27/2005 Document Revised: 04/17/2015 Document Reviewed: 03/08/2013 Elsevier Interactive Patient Education 2016 [employer] Inc[employer]   Low-Fat Diet for Gallbladder Conditions A low-fat diet can be helpful if you have pancreatitis or a gallbladder condition. With these conditions, your pancreas and gallbladder have trouble digesting fats. A healthy eating plan with less fat will help rest your pancreas and gallbladder and reduce your symptoms. WHAT DO I NEED TO KNOW ABOUT THIS DIET? Eat a low-fat diet. Reduce your fat intake to less  than 20-30% of your total daily calories. This is less than 50-60 g of fat per day. Remember that you need some fat in your diet. Ask your dietician what your daily goal should be. Choose nonfat and low-fat healthy foods. Look for the words "nonfat," "low fat," or "fat free." As a guide, look on the label and choose foods with less than 3 g of fat per serving. Eat only one serving. Avoid alcohol. Do not smoke. If you need help quitting, talk with your health care provider. Eat small frequent meals instead of three large heavy meals. WHAT FOODS CAN I EAT? Grains Include healthy grains and starches such as potatoes, wheat bread, fiber-rich cereal, and brown rice. Choose whole grain options whenever possible. In adults, whole grains should account for 45-65% of your daily calories.  Fruits and Vegetables Eat plenty of fruits and vegetables. Fresh fruits and vegetables add fiber to your diet. Meats and Other Protein Sources Eat lean meat such as chicken and pork. Trim any fat off of meat before cooking it. Eggs, fish, and beans are other sources of protein. In adults, these foods should account for 10-35% of your daily calories. Dairy Choose low-fat milk and dairy options. Dairy includes fat and protein, as well as calcium.  Fats and Oils Limit high-fat foods such as fried foods, sweets, baked  goods, sugary drinks.  Other Creamy sauces and condiments, such as mayonnaise, can add extra fat. Think about whether or not you need to use them, or use smaller amounts or low fat options. WHAT FOODS ARE NOT RECOMMENDED? High fat foods, such as: [employer]. Ice cream. [place] toast. Sweet rolls. Pizza. Cheese bread. Foods covered with batter, butter, creamy sauces, or cheese. Fried foods. Sugary drinks and desserts. Foods that cause gas or bloating   This information is not intended to replace advice given to you by your health care provider. Make sure you discuss any questions you have with your  health care provider.   Document Released: 08/01/2013 Document Reviewed: 08/01/2013 Elsevier Interactive Patient Education [employer]
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
## 2023-06-22 NOTE — Telephone Encounter (Signed)
Patient has been advised of Pre-Admission date/time, and Surgery date at [facility] while in office.  Surgery Date: 06/23/23 Preadmission Testing Date: 06/23/23 (work in for next day, patient informed to arrive @ 6:30am)  Patient reminded to be NPO after midnight and stop Metformin.

## 2023-06-23 ENCOUNTER — Ambulatory Visit
Admission: RE | Admit: 2023-06-23 | Discharge: 2023-06-23 | Disposition: A | Discharge status: Home / Self Care | Payer: 59 | Attending: Surgery | Admitting: Surgery

## 2023-06-23 ENCOUNTER — Ambulatory Visit: Payer: 59 | Admitting: General Practice

## 2023-06-23 ENCOUNTER — Encounter
Admission: RE | Disposition: A | Discharge status: Home / Self Care | Payer: Self-pay | Source: Home / Self Care | Attending: Surgery

## 2023-06-23 ENCOUNTER — Other Ambulatory Visit: Payer: Self-pay

## 2023-06-23 ENCOUNTER — Encounter: Payer: Self-pay | Admitting: Surgery

## 2023-06-23 DIAGNOSIS — K439 Ventral hernia without obstruction or gangrene: Secondary | ICD-10-CM | POA: Insufficient documentation

## 2023-06-23 DIAGNOSIS — I69320 Aphasia following cerebral infarction: Secondary | ICD-10-CM | POA: Insufficient documentation

## 2023-06-23 DIAGNOSIS — E119 Type 2 diabetes mellitus without complications: Secondary | ICD-10-CM | POA: Diagnosis not present

## 2023-06-23 DIAGNOSIS — K660 Peritoneal adhesions (postprocedural) (postinfection): Secondary | ICD-10-CM | POA: Insufficient documentation

## 2023-06-23 DIAGNOSIS — Z7984 Long term (current) use of oral hypoglycemic drugs: Secondary | ICD-10-CM | POA: Insufficient documentation

## 2023-06-23 DIAGNOSIS — I110 Hypertensive heart disease with heart failure: Secondary | ICD-10-CM | POA: Insufficient documentation

## 2023-06-23 DIAGNOSIS — I5022 Chronic systolic (congestive) heart failure: Secondary | ICD-10-CM | POA: Insufficient documentation

## 2023-06-23 DIAGNOSIS — K8010 Calculus of gallbladder with chronic cholecystitis without obstruction: Secondary | ICD-10-CM | POA: Insufficient documentation

## 2023-06-23 DIAGNOSIS — Z79899 Other long term (current) drug therapy: Secondary | ICD-10-CM | POA: Insufficient documentation

## 2023-06-23 DIAGNOSIS — K219 Gastro-esophageal reflux disease without esophagitis: Secondary | ICD-10-CM | POA: Diagnosis not present

## 2023-06-23 HISTORY — PX: LYSIS OF ADHESION: SHX5961

## 2023-06-23 LAB — COMPREHENSIVE METABOLIC PANEL
ALT: 14 U/L (ref 0–44)
AST: 24 U/L (ref 15–41)
Albumin: 4.7 g/dL (ref 3.5–5.0)
Alkaline Phosphatase: 43 U/L (ref 38–126)
Anion gap: 18 — ABNORMAL HIGH (ref 5–15)
BUN: 41 mg/dL — ABNORMAL HIGH (ref 6–20)
CO2: 21 mmol/L — ABNORMAL LOW (ref 22–32)
Calcium: 10.4 mg/dL — ABNORMAL HIGH (ref 8.9–10.3)
Chloride: 100 mmol/L (ref 98–111)
Creatinine, Ser: 2.45 mg/dL — ABNORMAL HIGH (ref 0.44–1.00)
GFR, Estimated: 22 mL/min — ABNORMAL LOW (ref >60)
Glucose, Bld: 99 mg/dL (ref 70–99)
Potassium: 4.3 mmol/L (ref 3.5–5.1)
Sodium: 139 mmol/L (ref 135–145)
Total Bilirubin: 1.2 mg/dL — ABNORMAL HIGH (ref <1.2)
Total Protein: 8.1 g/dL (ref 6.5–8.1)

## 2023-06-23 LAB — CBC WITH DIFFERENTIAL/PLATELET
Abs Immature Granulocytes: 0.03 10*3/uL (ref 0.00–0.07)
Basophils Absolute: 0 10*3/uL (ref 0.0–0.1)
Basophils Relative: 1 %
Eosinophils Absolute: 0.3 10*3/uL (ref 0.0–0.5)
Eosinophils Relative: 4 %
HCT: 42.5 % (ref 36.0–46.0)
Hemoglobin: 14 g/dL (ref 12.0–15.0)
Immature Granulocytes: 0 %
Lymphocytes Relative: 32 %
Lymphs Abs: 2.2 10*3/uL (ref 0.7–4.0)
MCH: 29.7 pg (ref 26.0–34.0)
MCHC: 32.9 g/dL (ref 30.0–36.0)
MCV: 90.2 fL (ref 80.0–100.0)
Monocytes Absolute: 0.5 10*3/uL (ref 0.1–1.0)
Monocytes Relative: 8 %
Neutro Abs: 3.8 10*3/uL (ref 1.7–7.7)
Neutrophils Relative %: 55 %
Platelets: 237 10*3/uL (ref 150–400)
RBC: 4.71 MIL/uL (ref 3.87–5.11)
RDW: 13.4 % (ref 11.5–15.5)
WBC: 6.9 10*3/uL (ref 4.0–10.5)
nRBC: 0 % (ref 0.0–0.2)

## 2023-06-23 LAB — GLUCOSE, CAPILLARY
Glucose-Capillary: 85 mg/dL (ref 70–99)
Glucose-Capillary: 109 mg/dL — ABNORMAL HIGH (ref 70–99)

## 2023-06-23 SURGERY — CHOLECYSTECTOMY, ROBOT-ASSISTED, LAPAROSCOPIC
Anesthesia: General

## 2023-06-23 MED ORDER — MIDAZOLAM HCL 2 MG/2ML IJ SOLN
INTRAMUSCULAR | Status: AC
Start: 2023-06-23 — End: ?
  Filled 2023-06-23: qty 2

## 2023-06-23 MED ORDER — DEXAMETHASONE SODIUM PHOSPHATE 10 MG/ML IJ SOLN
INTRAMUSCULAR | Status: AC
  Filled 2023-06-23: qty 2

## 2023-06-23 MED ORDER — ORAL CARE MOUTH RINSE: 15.0000 mL | Freq: Once | OROMUCOSAL | Status: AC

## 2023-06-23 MED ORDER — BUPIVACAINE-EPINEPHRINE (PF) 0.25% -1:200000 IJ SOLN
INTRAMUSCULAR | Status: DC | PRN
  Administered 2023-06-23: 30 mL

## 2023-06-23 MED ORDER — PHENYLEPHRINE 80 MCG/ML (10ML) SYRINGE FOR IV PUSH (FOR BLOOD PRESSURE SUPPORT)
PREFILLED_SYRINGE | INTRAVENOUS | Status: DC | PRN
  Administered 2023-06-23: 80 ug via INTRAVENOUS
  Administered 2023-06-23: 160 ug via INTRAVENOUS
  Administered 2023-06-23 (×2): 80 ug via INTRAVENOUS

## 2023-06-23 MED ORDER — LIDOCAINE HCL (CARDIAC) PF 100 MG/5ML IV SOSY
PREFILLED_SYRINGE | INTRAVENOUS | Status: DC | PRN
  Administered 2023-06-23: 80 mg via INTRAVENOUS

## 2023-06-23 MED ORDER — INDOCYANINE GREEN 25 MG IV SOLR
INTRAVENOUS | Status: AC
  Filled 2023-06-23: qty 10

## 2023-06-23 MED ORDER — FENTANYL CITRATE (PF) 100 MCG/2ML IJ SOLN
INTRAMUSCULAR | Status: AC
  Filled 2023-06-23: qty 2

## 2023-06-23 MED ORDER — SUGAMMADEX SODIUM 200 MG/2ML IV SOLN
INTRAVENOUS | Status: DC | PRN
  Administered 2023-06-23: 200 mg via INTRAVENOUS

## 2023-06-23 MED ORDER — GABAPENTIN 300 MG PO CAPS
ORAL_CAPSULE | ORAL | Status: AC
  Filled 2023-06-23: qty 1

## 2023-06-23 MED ORDER — BUPIVACAINE LIPOSOME 1.3 % IJ SUSP: 20.0000 mL | Freq: Once | INTRAMUSCULAR | Status: DC

## 2023-06-23 MED ORDER — ONDANSETRON HCL 4 MG/2ML IJ SOLN
INTRAMUSCULAR | Status: DC | PRN
  Administered 2023-06-23: 4 mg via INTRAVENOUS

## 2023-06-23 MED ORDER — PHENYLEPHRINE 80 MCG/ML (10ML) SYRINGE FOR IV PUSH (FOR BLOOD PRESSURE SUPPORT)
PREFILLED_SYRINGE | INTRAVENOUS | Status: AC
  Filled 2023-06-23: qty 20

## 2023-06-23 MED ORDER — GABAPENTIN 300 MG PO CAPS
300.0000 mg | ORAL_CAPSULE | ORAL | Status: AC
  Administered 2023-06-23: 300 mg via ORAL

## 2023-06-23 MED ORDER — PHENYLEPHRINE HCL-NACL 20-0.9 MG/250ML-% IV SOLN
INTRAVENOUS | Status: DC | PRN
  Administered 2023-06-23: 20 ug/min via INTRAVENOUS

## 2023-06-23 MED ORDER — INDOCYANINE GREEN 25 MG IV SOLR
1.2500 mg | Freq: Once | INTRAVENOUS | Status: AC
  Administered 2023-06-23: 1.25 mg via INTRAVENOUS

## 2023-06-23 MED ORDER — PROPOFOL 10 MG/ML IV BOLUS
INTRAVENOUS | Status: DC | PRN
  Administered 2023-06-23: 30 mg via INTRAVENOUS
  Administered 2023-06-23: 50 mg via INTRAVENOUS

## 2023-06-23 MED ORDER — CHLORHEXIDINE GLUCONATE CLOTH 2 % EX PADS: 6.0000 | MEDICATED_PAD | Freq: Once | CUTANEOUS | Status: DC

## 2023-06-23 MED ORDER — DEXAMETHASONE SODIUM PHOSPHATE 10 MG/ML IJ SOLN
INTRAMUSCULAR | Status: DC | PRN
  Administered 2023-06-23: 10 mg via INTRAVENOUS

## 2023-06-23 MED ORDER — ACETAMINOPHEN 500 MG PO TABS
ORAL_TABLET | ORAL | Status: AC
  Filled 2023-06-23: qty 2

## 2023-06-23 MED ORDER — HYDROCODONE-ACETAMINOPHEN 5-325 MG PO TABS: 1.0000 | ORAL_TABLET | Freq: Four times a day (QID) | ORAL | 0 refills | Status: DC | PRN

## 2023-06-23 MED ORDER — CEFAZOLIN SODIUM-DEXTROSE 2-4 GM/100ML-% IV SOLN
INTRAVENOUS | Status: AC
  Filled 2023-06-23: qty 100

## 2023-06-23 MED ORDER — CHLORHEXIDINE GLUCONATE CLOTH 2 % EX PADS
6.0000 | MEDICATED_PAD | Freq: Once | CUTANEOUS | Status: AC
  Administered 2023-06-23: 6 via TOPICAL

## 2023-06-23 MED ORDER — CEFAZOLIN SODIUM-DEXTROSE 2-4 GM/100ML-% IV SOLN
2.0000 g | INTRAVENOUS | Status: AC
  Administered 2023-06-23: 2 g via INTRAVENOUS

## 2023-06-23 MED ORDER — FENTANYL CITRATE (PF) 100 MCG/2ML IJ SOLN
25.0000 ug | INTRAMUSCULAR | Status: DC | PRN
  Administered 2023-06-23: 25 ug via INTRAVENOUS

## 2023-06-23 MED ORDER — OXYCODONE HCL 5 MG/5ML PO SOLN: 5.0000 mg | Freq: Once | ORAL | Status: AC | PRN

## 2023-06-23 MED ORDER — MIDAZOLAM HCL 2 MG/2ML IJ SOLN
INTRAMUSCULAR | Status: DC | PRN
Start: 2023-06-23 — End: 2023-06-23
  Administered 2023-06-23: 2 mg via INTRAVENOUS

## 2023-06-23 MED ORDER — ROCURONIUM BROMIDE 10 MG/ML (PF) SYRINGE
PREFILLED_SYRINGE | INTRAVENOUS | Status: AC
  Filled 2023-06-23: qty 20

## 2023-06-23 MED ORDER — GLYCOPYRROLATE 0.2 MG/ML IJ SOLN
INTRAMUSCULAR | Status: DC | PRN
  Administered 2023-06-23: .2 mg via INTRAVENOUS

## 2023-06-23 MED ORDER — FENTANYL CITRATE (PF) 100 MCG/2ML IJ SOLN
INTRAMUSCULAR | Status: DC | PRN
  Administered 2023-06-23: 25 ug via INTRAVENOUS
  Administered 2023-06-23: 75 ug via INTRAVENOUS

## 2023-06-23 MED ORDER — OXYCODONE HCL 5 MG PO TABS
ORAL_TABLET | ORAL | Status: AC
  Filled 2023-06-23: qty 1

## 2023-06-23 MED ORDER — BUPIVACAINE-EPINEPHRINE (PF) 0.25% -1:200000 IJ SOLN
INTRAMUSCULAR | Status: AC
  Filled 2023-06-23: qty 30

## 2023-06-23 MED ORDER — OXYCODONE HCL 5 MG PO TABS
5.0000 mg | ORAL_TABLET | Freq: Once | ORAL | Status: AC | PRN
  Administered 2023-06-23: 5 mg via ORAL

## 2023-06-23 MED ORDER — CHLORHEXIDINE GLUCONATE 0.12 % MT SOLN
15.0000 mL | Freq: Once | OROMUCOSAL | Status: AC
Start: 2023-06-23 — End: 2023-06-23
  Administered 2023-06-23: 15 mL via OROMUCOSAL

## 2023-06-23 MED ORDER — ROCURONIUM BROMIDE 100 MG/10ML IV SOLN
INTRAVENOUS | Status: DC | PRN
  Administered 2023-06-23: 50 mg via INTRAVENOUS

## 2023-06-23 MED ORDER — SODIUM CHLORIDE 0.9 % IV SOLN: INTRAVENOUS | Status: DC

## 2023-06-23 MED ORDER — SUCCINYLCHOLINE CHLORIDE 200 MG/10ML IV SOSY
PREFILLED_SYRINGE | INTRAVENOUS | Status: AC
  Filled 2023-06-23: qty 10

## 2023-06-23 MED ORDER — CHLORHEXIDINE GLUCONATE 0.12 % MT SOLN
OROMUCOSAL | Status: AC
  Filled 2023-06-23: qty 15

## 2023-06-23 MED ORDER — LACTATED RINGERS IV SOLN: INTRAVENOUS | Status: DC | PRN

## 2023-06-23 MED ORDER — ACETAMINOPHEN 500 MG PO TABS
1000.0000 mg | ORAL_TABLET | ORAL | Status: AC
  Administered 2023-06-23: 1000 mg via ORAL

## 2023-06-23 MED ORDER — ONDANSETRON HCL 4 MG/2ML IJ SOLN
INTRAMUSCULAR | Status: AC
  Filled 2023-06-23: qty 8

## 2023-06-23 SURGICAL SUPPLY — 47 items
ADH SKN CLS APL DERMABOND .7 (GAUZE/BANDAGES/DRESSINGS) ×2
BAG PRESSURE INF REUSE 3000 (BAG) IMPLANT
BINDER ABDOMINAL 12 ML 46-62 (SOFTGOODS) IMPLANT
CLIP LIGATING HEM O LOK PURPLE (MISCELLANEOUS) ×2 IMPLANT
COVER TIP SHEARS 8 DVNC (MISCELLANEOUS) ×2 IMPLANT
DERMABOND ADVANCED .7 DNX12 (GAUZE/BANDAGES/DRESSINGS) ×2 IMPLANT
DRAPE ARM DVNC X/XI (DISPOSABLE) ×8 IMPLANT
DRAPE COLUMN DVNC XI (DISPOSABLE) ×2 IMPLANT
ELECT CAUTERY BLADE 6.4 (BLADE) ×2 IMPLANT
FORCEPS BPLR R/ABLATION 8 DVNC (INSTRUMENTS) ×2 IMPLANT
FORCEPS PROGRASP DVNC XI (FORCEP) ×2 IMPLANT
GLOVE ORTHO TXT STRL SZ7.5 (GLOVE) ×4 IMPLANT
GOWN STRL REUS W/ TWL LRG LVL3 (GOWN DISPOSABLE) ×4 IMPLANT
GOWN STRL REUS W/ TWL XL LVL3 (GOWN DISPOSABLE) ×4 IMPLANT
GOWN STRL REUS W/TWL LRG LVL3 (GOWN DISPOSABLE) ×4
GOWN STRL REUS W/TWL XL LVL3 (GOWN DISPOSABLE) ×4
GRASPER SUT TROCAR 14GX15 (MISCELLANEOUS) ×2 IMPLANT
IRRIGATION STRYKERFLOW (MISCELLANEOUS) IMPLANT
IRRIGATOR STRYKERFLOW (MISCELLANEOUS)
IRRIGATOR SUCT 8 DISP DVNC XI (IRRIGATION / IRRIGATOR) IMPLANT
IV NS IRRIG 3000ML ARTHROMATIC (IV SOLUTION) IMPLANT
KIT PINK PAD W/HEAD ARE REST (MISCELLANEOUS) ×2
KIT PINK PAD W/HEAD ARM REST (MISCELLANEOUS) ×2 IMPLANT
KIT TURNOVER KIT A (KITS) ×2 IMPLANT
LABEL OR SOLS (LABEL) ×2 IMPLANT
MANIFOLD NEPTUNE II (INSTRUMENTS) ×2 IMPLANT
NDL HYPO 22X1.5 SAFETY MO (MISCELLANEOUS) ×2 IMPLANT
NDL INSUFFLATION 14GA 120MM (NEEDLE) ×2 IMPLANT
NEEDLE HYPO 22X1.5 SAFETY MO (MISCELLANEOUS) ×2 IMPLANT
NEEDLE INSUFFLATION 14GA 120MM (NEEDLE) ×2 IMPLANT
NS IRRIG 500ML POUR BTL (IV SOLUTION) ×2 IMPLANT
PACK LAP CHOLECYSTECTOMY (MISCELLANEOUS) ×2 IMPLANT
SCISSORS MNPLR CVD DVNC XI (INSTRUMENTS) ×2 IMPLANT
SEAL UNIV 5-12 XI (MISCELLANEOUS) ×8 IMPLANT
SET TUBE SMOKE EVAC HIGH FLOW (TUBING) ×2 IMPLANT
SOL ELECTROSURG ANTI STICK (MISCELLANEOUS) ×2
SOLUTION ELECTROSURG ANTI STCK (MISCELLANEOUS) ×2 IMPLANT
SPIKE FLUID TRANSFER (MISCELLANEOUS) ×2 IMPLANT
SUT MNCRL 4-0 (SUTURE) ×2
SUT MNCRL 4-0 27XMFL (SUTURE) ×2
SUT VICRYL 0 UR6 27IN ABS (SUTURE) ×2 IMPLANT
SUTURE MNCRL 4-0 27XMF (SUTURE) ×2 IMPLANT
SYS BAG RETRIEVAL 10MM (BASKET) ×2
SYSTEM BAG RETRIEVAL 10MM (BASKET) ×2 IMPLANT
TRAP FLUID SMOKE EVACUATOR (MISCELLANEOUS) ×2 IMPLANT
TROCAR Z-THREAD FIOS 11X100 BL (TROCAR) ×2 IMPLANT
WATER STERILE IRR 500ML POUR (IV SOLUTION) ×2 IMPLANT

## 2023-06-23 NOTE — Transfer of Care (Signed)
Immediate Anesthesia Transfer of Care Note  Patient: Sarah Lane  Procedure(s) Performed: XI ROBOTIC ASSISTED LAPAROSCOPIC CHOLECYSTECTOMY INDOCYANINE GREEN FLUORESCENCE IMAGING (ICG) LYSIS OF ADHESION  Patient Location: PACU  Anesthesia Type:General  Level of Consciousness: drowsy and patient cooperative  Airway & Oxygen Therapy: Patient Spontanous Breathing  Post-op Assessment: Report given to RN and Post -op Vital signs reviewed and stable  Post vital signs: stable  Last Vitals:  Vitals Value Taken Time  BP 119/52 06/23/23 1206  Temp    Pulse 91 06/23/23 1209  Resp 38 06/23/23 1209  SpO2 99 % 06/23/23 1209  Vitals shown include unfiled device data.  Last Pain:  Vitals:   06/23/23 0751  TempSrc: Temporal  PainSc: 0-No pain         Complications: No notable events documented.

## 2023-06-23 NOTE — Op Note (Signed)
Robotic cholecystectomy with Indocyamine Green Ductal Imaging.   Pre-operative Diagnosis: Chronic calculus cholecystitis  Post-operative Diagnosis:  Chronic calculus cholecystitis, ventral hernia, anterior abdominal wall adhesions, extensive.   Procedure: Robotic assisted laparoscopic cholecystectomy with Indocyamine Green Ductal Imaging, lysis of adhesions.   Surgeon: Campbell Lerner, M.D., FACS  Anesthesia: General. with endotracheal tube  Findings: Wide mouthed para umbilical hernia, and [place] lakes AA wall defects, w/ no intestinal contents or bowel adhesions.  Omental adhesions with lysis of 30 min duration.   Estimated Blood Loss: 15 mL         Drains: None         Specimens: Gallbladder           Complications: none  Procedure Details  The patient was seen again in the Holding Room.  1.25 mg dose of ICG was administered intravenously.   The benefits, complications, treatment options, risks and expected outcomes were again reviewed with the patient. The likelihood of improving the patient's symptoms with return to their baseline status is good.  The patient and/or family concurred with the proposed plan, giving informed consent, again alternatives reviewed.  The patient was taken to Operating Room, identified, and the procedure verified as robotic assisted laparoscopic cholecystectomy.  Prior to the induction of general anesthesia, antibiotic prophylaxis was administered. VTE prophylaxis was in place. General endotracheal anesthesia was then administered and tolerated well. The patient was positioned in the supine position.  After the induction, the abdomen was prepped with Chloraprep and draped in the sterile fashion.  A Time Out was held and the above information confirmed.  After local infiltration of quarter percent Marcaine with epinephrine, stab incision was made left upper quadrant.  Just below the costal margin at Palmer's point, approximately midclavicular line the  Veres needle is passed with sensation of the layers to penetrate the abdominal wall and into the peritoneum.  Saline drop test is confirmed peritoneal placement.  Insufflation is initiated with carbon dioxide to pressures of 15 mmHg.  Local infiltration with 0.25% Marcaine with epinephrine is utilized for all skin incisions.  Made a 12 mm incision on the left subcostal site, I advanced an optical 11mm port under direct visualization into the peritoneal cavity.  Once the peritoneum was penetrated, insufflation was initiated.  The trocar was then advanced into the abdominal cavity under direct visualization. Pneumoperitoneum was then continued utilizing CO2 at 15 mmHg or less and tolerated well without any adverse changes in the patient's vital signs.  Two 8.5-mm ports were placed in the left lower quadrant and laterally, and one to the right lower quadrant, all under direct vision. Local infiltration with  0.25% Marcaine with epinephrine is utilized for all port sites with deep infiltration under visualization.   The patient was positioned  in reverse Trendelenburg.   Da Vinci XI robot was then positioned on to the patient's left side, and docked. The lysis of adhesions was completed first which involved multiple anterior abdominal wall fascial defects and omental adhesions.  These were all taken down with sharp dissection with excellent hemostasis.  There is no evidence of bowel involvement there is no serosal adhesions of bowel to the anterior abdominal wall or involvement in any of the hernia defects.  The periumbilical hernia defect was [place] and the smaller cephalad defect from there was easily 3-1/2 to 4 cm significantly smaller than the periumbilical defect.  She had not consented for proceeding with the hernia repair, and reportedly the hernia was not bothering her.  So we proceeded with the cholecystectomy alone in mind.  Once adhesions were fully lysed, we had ready access to the right upper  quadrant.  The gallbladder was identified, the fundus grasped via the arm 4 Prograsp and retracted cephalad. Adhesions were lysed with scissors and cautery.  The infundibulum was identified grasped and retracted laterally, exposing the peritoneum overlying the triangle of Calot. This was then opened and dissected using cautery & scissors.  We did achieve the critical view of safety with 2 and only 2 structures entering the gallbladder and we were able to see the liver plate posterior to this from both sides;with the extended critical view of the cystic duct and cystic artery obtained, aided by the ICG via FireFly which improved localization of the major ductal anatomy.    The cystic duct was clearly identified and dissected to isolation.   Artery well isolated and clipped, and the cystic duct was triple clipped and divided with scissors, as close to the gallbladder neck as feasible, thus leaving two on the remaining stump.  The specimen side of the artery is sealed with bipolar and divided with monopolar scissors.   The gallbladder was taken from the gallbladder fossa in a retrograde fashion with the electrocautery. The gallbladder was removed and placed in an Endocatch bag.  The liver bed is inspected. Hemostasis was confirmed.  The robot was undocked and moved away from the operative field. No irrigation was utilized.  The gallbladder and Endocatch sac were then removed through the left subcostal port site.   Inspection of the right upper quadrant was performed. No bleeding, bile duct injury or leak, or bowel injury was noted. The left subcostal port site fascia was closed with interrumpted 0 Vicryl sutures using PMI/cone under direct visualization. Pneumoperitoneum was released and ports removed.  4-0 subcuticular Monocryl was used to close the skin. Dermabond was  applied.   Abdominal binder was applied for postoperative comfort, and support. The patient was then extubated and brought to the  recovery room in stable condition. Sponge, lap, and needle counts were correct at closure and at the conclusion of the case.               Campbell Lerner, M.D., [facility] 06/23/2023 12:16 PM

## 2023-06-23 NOTE — Anesthesia Procedure Notes (Signed)
Procedure Name: Intubation Date/Time: 06/23/2023 10:54 AM  Performed by: Maryla Morrow., CRNAPre-anesthesia Checklist: Patient identified, Patient being monitored, Timeout performed, Emergency Drugs available and Suction available Patient Re-evaluated:Patient Re-evaluated prior to induction Oxygen Delivery Method: Circle system utilized Preoxygenation: Pre-oxygenation with 100% oxygen Induction Type: IV induction Ventilation: Mask ventilation without difficulty Laryngoscope Size: 3 and McGrath Grade View: Grade I Tube type: Oral Tube size: 6.0 mm Number of attempts: 1 Airway Equipment and Method: Stylet Placement Confirmation: ETT inserted through vocal cords under direct vision, positive ETCO2 and breath sounds checked- equal and bilateral Secured at: 19 cm Tube secured with: Tape Dental Injury: Teeth and Oropharynx as per pre-operative assessment

## 2023-06-23 NOTE — Anesthesia Preprocedure Evaluation (Signed)
Anesthesia Evaluation  Patient identified by MRN, date of birth, ID band Patient awake    Reviewed: Allergy & Precautions, NPO status , Patient's Chart, lab work & pertinent test results  Airway Mallampati: III  TM Distance: >3 FB Neck ROM: full    Dental  (+) Dental Advidsory Given, Poor Dentition   Pulmonary neg pulmonary ROS   Pulmonary exam normal        Cardiovascular hypertension, +CHF  Normal cardiovascular exam     Neuro/Psych negative neurological ROS  negative psych ROS   GI/Hepatic Neg liver ROS,GERD  Medicated,,  Endo/Other  negative endocrine ROSdiabetes    Renal/GU Renal disease     Musculoskeletal   Abdominal   Peds  Hematology negative hematology ROS (+)   Anesthesia Other Findings Past Medical History: No date: Aortic atherosclerosis (HCC)     Comment:  a. 03/2023 noted on CT. No date: Asthma     Comment:  childhood asthma No date: Cancer [facility])     Comment:  ovarian cancer No date: Cardiomyopathy [facility])     Comment:  a. 01/2023 Echo [facility]): EF 15-20%, impaired relaxation,               mildly to mod dil RV w/ mild RV dysfxn, mod PH, BAE; b.               03/2023 Echo: EF 30-35%, glob HK, GrI DD, mod reduced RV               fxn, mod enlarged RV, RVSP 42.5mmHg, mild MR, mild-mod               TR. No date: Chronic HFrEF (heart failure with reduced ejection fraction)  (HCC)     Comment:  a. Dx 01/2023-->[facility] Echo: EF 15-20%; b. 03/2023 Echo: EF               30-35%. No date: Diabetes (HCC) No date: Diabetes mellitus without complication (HCC) No date: Expressive aphasia     Comment:  a. 01/2023 L MCA occlusive stroke s/p thrombectomy. No date: Family history of adverse reaction to anesthesia     Comment:  Grand father had facial swelling No date: H/O ischemic left MCA stroke     Comment:  a. 01/2023 s/p admission @ [facility]. CTA Head w/ M1 occlusion               of L MCA, possible high-grade stenosis of  distal A3               segment. S/p thrombectomy. No date: Hypertension No date: PSVT (paroxysmal supraventricular tachycardia) (HCC) No date: Right lower lobe pulmonary nodule     Comment:  a. 03/2023 CT Chest - 14mm - stable. No date: Seizures [facility])     Comment:  petit mals at age 29 treated with medications. Last one               age 5  Past Surgical History: 2011: ABDOMINAL HYSTERECTOMY     Comment:  ovarian cancer 12/31/2014: IRRIGATION AND DEBRIDEMENT ABDOMEN; N/A     Comment:  Procedure: IRRIGATION AND DEBRIDEMENT ABDOMINAL WALL               ABSCESS;  Surgeon: Luretha Murphy, MD;  Location: [facility] ORS;              Service: General;  Laterality: N/A; 04/06/2023: RIGHT/LEFT HEART CATH AND CORONARY ANGIOGRAPHY; N/A     Comment:  Procedure: RIGHT/LEFT HEART  CATH AND CORONARY               ANGIOGRAPHY;  Surgeon: Yvonne Kendall, MD;  Location:               [facility] INVASIVE CV LAB;  Service: Cardiovascular;                Laterality: N/A;     Reproductive/Obstetrics negative OB ROS                             Anesthesia Physical Anesthesia Plan  ASA: 3  Anesthesia Plan: General ETT and General   Post-op Pain Management:    Induction: Intravenous  PONV Risk Score and Plan: 3 and Ondansetron, Dexamethasone and Midazolam  Airway Management Planned: Oral ETT  Additional Equipment:   Intra-op Plan:   Post-operative Plan: Extubation in OR  Informed Consent: I have reviewed the patients History and Physical, chart, labs and discussed the procedure including the risks, benefits and alternatives for the proposed anesthesia with the patient or authorized representative who has indicated his/her understanding and acceptance.     Dental Advisory Given  Plan Discussed with: Anesthesiologist, CRNA and Surgeon  Anesthesia Plan Comments: (Patient consented for risks of anesthesia including but not limited to:  - adverse reactions to medications - damage  to eyes, teeth, lips or other oral mucosa - nerve damage due to positioning  - sore throat or hoarseness - Damage to heart, brain, nerves, lungs, other parts of body or loss of life  Patient voiced understanding and assent.)       Anesthesia Quick Evaluation

## 2023-06-23 NOTE — Anesthesia Postprocedure Evaluation (Signed)
Anesthesia Post Note  Patient: Sarah Lane  Procedure(s) Performed: XI ROBOTIC ASSISTED LAPAROSCOPIC CHOLECYSTECTOMY INDOCYANINE GREEN FLUORESCENCE IMAGING (ICG) LYSIS OF ADHESION  Patient location during evaluation: PACU Anesthesia Type: General Level of consciousness: awake and alert Pain management: pain level controlled Vital Signs Assessment: post-procedure vital signs reviewed and stable Respiratory status: spontaneous breathing, nonlabored ventilation, respiratory function stable and patient connected to nasal cannula oxygen Cardiovascular status: blood pressure returned to baseline and stable Postop Assessment: no apparent nausea or vomiting Anesthetic complications: no  There were no known notable events for this encounter.   Last Vitals:  Vitals:   06/23/23 1245 06/23/23 1315  BP: (!) 81/62 (!) 102/48  Pulse: 87 82  Resp: 16 16  Temp:  36.6 C  SpO2: 100% 98%    Last Pain:  Vitals:   06/23/23 1315  TempSrc:   PainSc: 5                  Stephanie Coup

## 2023-06-23 NOTE — Interval H&P Note (Signed)
History and Physical Interval Note:  06/23/2023 9:54 AM  Sarah Lane  has presented today for surgery, with the diagnosis of chronic calculous cholecystitis.  The various methods of treatment have been discussed with the patient and family. After consideration of risks, benefits and other options for treatment, the patient has consented to  Procedure(s): XI ROBOTIC ASSISTED LAPAROSCOPIC CHOLECYSTECTOMY (N/A) INDOCYANINE GREEN FLUORESCENCE IMAGING (ICG) (N/A) as a surgical intervention.  The patient's history has been reviewed, patient examined, no change in status, stable for surgery.  I have reviewed the patient's chart and labs.  Questions were answered to the patient's satisfaction.     Campbell Lerner

## 2023-06-24 LAB — SURGICAL PATHOLOGY

## 2023-07-06 ENCOUNTER — Encounter: Payer: Self-pay | Admitting: Surgery

## 2023-07-06 ENCOUNTER — Ambulatory Visit (INDEPENDENT_AMBULATORY_CARE_PROVIDER_SITE_OTHER): Payer: 59 | Admitting: Surgery

## 2023-07-06 VITALS — BP 110/73 | HR 91 | Temp 98.0°F | Ht 60.0 in | Wt 136.0 lb

## 2023-07-06 DIAGNOSIS — K8010 Calculus of gallbladder with chronic cholecystitis without obstruction: Secondary | ICD-10-CM

## 2023-07-06 DIAGNOSIS — K432 Incisional hernia without obstruction or gangrene: Secondary | ICD-10-CM | POA: Insufficient documentation

## 2023-07-06 DIAGNOSIS — Z9049 Acquired absence of other specified parts of digestive tract: Secondary | ICD-10-CM
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
## 2023-07-06 NOTE — Patient Instructions (Signed)

## 2023-07-07 ENCOUNTER — Encounter: Payer: 59 | Admitting: Physician Assistant

## 2023-07-09 ENCOUNTER — Other Ambulatory Visit: Payer: Self-pay

## 2023-07-09 ENCOUNTER — Emergency Department: Payer: Medicaid Other

## 2023-07-09 ENCOUNTER — Inpatient Hospital Stay
Admission: EM | Admit: 2023-07-09 | Discharge: 2023-07-17 | DRG: 673 | Disposition: A | Discharge status: Home / Self Care | Payer: Medicaid Other | Attending: Internal Medicine | Admitting: Internal Medicine

## 2023-07-09 DIAGNOSIS — E11649 Type 2 diabetes mellitus with hypoglycemia without coma: Secondary | ICD-10-CM | POA: Diagnosis present

## 2023-07-09 DIAGNOSIS — I2510 Atherosclerotic heart disease of native coronary artery without angina pectoris: Secondary | ICD-10-CM | POA: Diagnosis present

## 2023-07-09 DIAGNOSIS — R4701 Aphasia: Secondary | ICD-10-CM | POA: Diagnosis present

## 2023-07-09 DIAGNOSIS — Z8673 Personal history of transient ischemic attack (TIA), and cerebral infarction without residual deficits: Secondary | ICD-10-CM

## 2023-07-09 DIAGNOSIS — Z888 Allergy status to other drugs, medicaments and biological substances status: Secondary | ICD-10-CM

## 2023-07-09 DIAGNOSIS — R112 Nausea with vomiting, unspecified: Secondary | ICD-10-CM | POA: Diagnosis not present

## 2023-07-09 DIAGNOSIS — I10 Essential (primary) hypertension: Secondary | ICD-10-CM | POA: Diagnosis present

## 2023-07-09 DIAGNOSIS — Z6827 Body mass index (BMI) 27.0-27.9, adult: Secondary | ICD-10-CM

## 2023-07-09 DIAGNOSIS — E871 Hypo-osmolality and hyponatremia: Secondary | ICD-10-CM | POA: Diagnosis present

## 2023-07-09 DIAGNOSIS — D631 Anemia in chronic kidney disease: Secondary | ICD-10-CM | POA: Diagnosis present

## 2023-07-09 DIAGNOSIS — Z794 Long term (current) use of insulin: Secondary | ICD-10-CM | POA: Diagnosis not present

## 2023-07-09 DIAGNOSIS — G40909 Epilepsy, unspecified, not intractable, without status epilepticus: Secondary | ICD-10-CM | POA: Diagnosis present

## 2023-07-09 DIAGNOSIS — E43 Unspecified severe protein-calorie malnutrition: Secondary | ICD-10-CM | POA: Insufficient documentation

## 2023-07-09 DIAGNOSIS — T50995A Adverse effect of other drugs, medicaments and biological substances, initial encounter: Secondary | ICD-10-CM | POA: Diagnosis present

## 2023-07-09 DIAGNOSIS — N3289 Other specified disorders of bladder: Secondary | ICD-10-CM | POA: Diagnosis present

## 2023-07-09 DIAGNOSIS — B9562 Methicillin resistant Staphylococcus aureus infection as the cause of diseases classified elsewhere: Secondary | ICD-10-CM | POA: Diagnosis not present

## 2023-07-09 DIAGNOSIS — N189 Chronic kidney disease, unspecified: Secondary | ICD-10-CM | POA: Diagnosis present

## 2023-07-09 DIAGNOSIS — E876 Hypokalemia: Secondary | ICD-10-CM | POA: Diagnosis present

## 2023-07-09 DIAGNOSIS — Z992 Dependence on renal dialysis: Secondary | ICD-10-CM | POA: Diagnosis not present

## 2023-07-09 DIAGNOSIS — Z7984 Long term (current) use of oral hypoglycemic drugs: Secondary | ICD-10-CM | POA: Diagnosis not present

## 2023-07-09 DIAGNOSIS — N186 End stage renal disease: Secondary | ICD-10-CM | POA: Diagnosis present

## 2023-07-09 DIAGNOSIS — J45909 Unspecified asthma, uncomplicated: Secondary | ICD-10-CM | POA: Diagnosis present

## 2023-07-09 DIAGNOSIS — I700 Atherosclerosis of aorta: Secondary | ICD-10-CM | POA: Diagnosis present

## 2023-07-09 DIAGNOSIS — I953 Hypotension of hemodialysis: Secondary | ICD-10-CM | POA: Diagnosis not present

## 2023-07-09 DIAGNOSIS — R8290 Unspecified abnormal findings in urine: Secondary | ICD-10-CM | POA: Diagnosis present

## 2023-07-09 DIAGNOSIS — I132 Hypertensive heart and chronic kidney disease with heart failure and with stage 5 chronic kidney disease, or end stage renal disease: Secondary | ICD-10-CM | POA: Diagnosis present

## 2023-07-09 DIAGNOSIS — K439 Ventral hernia without obstruction or gangrene: Secondary | ICD-10-CM | POA: Diagnosis present

## 2023-07-09 DIAGNOSIS — E872 Acidosis, unspecified: Secondary | ICD-10-CM | POA: Diagnosis present

## 2023-07-09 DIAGNOSIS — Z7982 Long term (current) use of aspirin: Secondary | ICD-10-CM

## 2023-07-09 DIAGNOSIS — Z90710 Acquired absence of both cervix and uterus: Secondary | ICD-10-CM

## 2023-07-09 DIAGNOSIS — Z9049 Acquired absence of other specified parts of digestive tract: Secondary | ICD-10-CM

## 2023-07-09 DIAGNOSIS — Z452 Encounter for adjustment and management of vascular access device: Secondary | ICD-10-CM

## 2023-07-09 DIAGNOSIS — E875 Hyperkalemia: Secondary | ICD-10-CM | POA: Diagnosis present

## 2023-07-09 DIAGNOSIS — E1165 Type 2 diabetes mellitus with hyperglycemia: Secondary | ICD-10-CM | POA: Diagnosis present

## 2023-07-09 DIAGNOSIS — N170 Acute kidney failure with tubular necrosis: Principal | ICD-10-CM | POA: Diagnosis present

## 2023-07-09 DIAGNOSIS — R197 Diarrhea, unspecified: Secondary | ICD-10-CM | POA: Diagnosis present

## 2023-07-09 DIAGNOSIS — E8729 Other acidosis: Secondary | ICD-10-CM | POA: Diagnosis present

## 2023-07-09 DIAGNOSIS — Z5982 Transportation insecurity: Secondary | ICD-10-CM

## 2023-07-09 DIAGNOSIS — Z820 Family history of epilepsy and other diseases of the nervous system: Secondary | ICD-10-CM

## 2023-07-09 DIAGNOSIS — E1122 Type 2 diabetes mellitus with diabetic chronic kidney disease: Secondary | ICD-10-CM | POA: Diagnosis present

## 2023-07-09 DIAGNOSIS — I5022 Chronic systolic (congestive) heart failure: Secondary | ICD-10-CM | POA: Diagnosis present

## 2023-07-09 DIAGNOSIS — Z8543 Personal history of malignant neoplasm of ovary: Secondary | ICD-10-CM

## 2023-07-09 DIAGNOSIS — E878 Other disorders of electrolyte and fluid balance, not elsewhere classified: Secondary | ICD-10-CM | POA: Diagnosis present

## 2023-07-09 DIAGNOSIS — Z885 Allergy status to narcotic agent status: Secondary | ICD-10-CM

## 2023-07-09 DIAGNOSIS — N390 Urinary tract infection, site not specified: Secondary | ICD-10-CM | POA: Diagnosis present

## 2023-07-09 DIAGNOSIS — E869 Volume depletion, unspecified: Secondary | ICD-10-CM | POA: Diagnosis present

## 2023-07-09 DIAGNOSIS — N281 Cyst of kidney, acquired: Secondary | ICD-10-CM | POA: Diagnosis present

## 2023-07-09 DIAGNOSIS — B961 Klebsiella pneumoniae [K. pneumoniae] as the cause of diseases classified elsewhere: Secondary | ICD-10-CM | POA: Diagnosis present

## 2023-07-09 DIAGNOSIS — E162 Hypoglycemia, unspecified: Secondary | ICD-10-CM | POA: Diagnosis present

## 2023-07-09 DIAGNOSIS — Z79899 Other long term (current) drug therapy: Secondary | ICD-10-CM

## 2023-07-09 DIAGNOSIS — Z5986 Financial insecurity: Secondary | ICD-10-CM

## 2023-07-09 DIAGNOSIS — N179 Acute kidney failure, unspecified: Principal | ICD-10-CM

## 2023-07-09 DIAGNOSIS — Z8249 Family history of ischemic heart disease and other diseases of the circulatory system: Secondary | ICD-10-CM

## 2023-07-09 DIAGNOSIS — E785 Hyperlipidemia, unspecified: Secondary | ICD-10-CM | POA: Diagnosis present

## 2023-07-09 DIAGNOSIS — K219 Gastro-esophageal reflux disease without esophagitis: Secondary | ICD-10-CM | POA: Diagnosis present

## 2023-07-09 DIAGNOSIS — E119 Type 2 diabetes mellitus without complications: Secondary | ICD-10-CM

## 2023-07-09 LAB — COMPREHENSIVE METABOLIC PANEL
ALT: 14 U/L (ref 0–44)
AST: 26 U/L (ref 15–41)
Albumin: 4.5 g/dL (ref 3.5–5.0)
Alkaline Phosphatase: 52 U/L (ref 38–126)
Anion gap: 25 — ABNORMAL HIGH (ref 5–15)
BUN: 72 mg/dL — ABNORMAL HIGH (ref 6–20)
CO2: 16 mmol/L — ABNORMAL LOW (ref 22–32)
Calcium: 9.9 mg/dL (ref 8.9–10.3)
Chloride: 90 mmol/L — ABNORMAL LOW (ref 98–111)
Creatinine, Ser: 5.71 mg/dL — ABNORMAL HIGH (ref 0.44–1.00)
GFR, Estimated: 8 mL/min — ABNORMAL LOW (ref >60)
Glucose, Bld: 53 mg/dL — ABNORMAL LOW (ref 70–99)
Potassium: 5.2 mmol/L — ABNORMAL HIGH (ref 3.5–5.1)
Sodium: 131 mmol/L — ABNORMAL LOW (ref 135–145)
Total Bilirubin: 1.3 mg/dL — ABNORMAL HIGH (ref <1.2)
Total Protein: 7.8 g/dL (ref 6.5–8.1)

## 2023-07-09 LAB — CBC WITH DIFFERENTIAL/PLATELET
Abs Immature Granulocytes: 0.08 10*3/uL — ABNORMAL HIGH (ref 0.00–0.07)
Basophils Absolute: 0.1 10*3/uL (ref 0.0–0.1)
Basophils Relative: 0 %
Eosinophils Absolute: 0.3 10*3/uL (ref 0.0–0.5)
Eosinophils Relative: 2 %
HCT: 41.7 % (ref 36.0–46.0)
Hemoglobin: 13.8 g/dL (ref 12.0–15.0)
Immature Granulocytes: 1 %
Lymphocytes Relative: 24 %
Lymphs Abs: 3.5 10*3/uL (ref 0.7–4.0)
MCH: 29.9 pg (ref 26.0–34.0)
MCHC: 33.1 g/dL (ref 30.0–36.0)
MCV: 90.3 fL (ref 80.0–100.0)
Monocytes Absolute: 0.9 10*3/uL (ref 0.1–1.0)
Monocytes Relative: 6 %
Neutro Abs: 9.9 10*3/uL — ABNORMAL HIGH (ref 1.7–7.7)
Neutrophils Relative %: 67 %
Platelets: 296 10*3/uL (ref 150–400)
RBC: 4.62 MIL/uL (ref 3.87–5.11)
RDW: 13.3 % (ref 11.5–15.5)
WBC: 14.8 10*3/uL — ABNORMAL HIGH (ref 4.0–10.5)
nRBC: 0 % (ref 0.0–0.2)

## 2023-07-09 LAB — LIPASE, BLOOD: Lipase: 120 U/L — ABNORMAL HIGH (ref 11–51)

## 2023-07-09 LAB — CBG MONITORING, ED
Glucose-Capillary: 45 mg/dL — ABNORMAL LOW (ref 70–99)
Glucose-Capillary: 123 mg/dL — ABNORMAL HIGH (ref 70–99)
Glucose-Capillary: 64 mg/dL — ABNORMAL LOW (ref 70–99)

## 2023-07-09 LAB — TROPONIN I (HIGH SENSITIVITY): Troponin I (High Sensitivity): 44 ng/L — ABNORMAL HIGH (ref <18)

## 2023-07-09 MED ORDER — MAGNESIUM HYDROXIDE 400 MG/5ML PO SUSP: 30.0000 mL | Freq: Every day | ORAL | Status: DC | PRN

## 2023-07-09 MED ORDER — TRAZODONE HCL 50 MG PO TABS
25.0000 mg | ORAL_TABLET | Freq: Every evening | ORAL | Status: DC | PRN
  Administered 2023-07-10: 25 mg via ORAL
  Filled 2023-07-09 (×2): qty 1

## 2023-07-09 MED ORDER — ACETAMINOPHEN 650 MG RE SUPP: 650.0000 mg | Freq: Four times a day (QID) | RECTAL | Status: DC | PRN

## 2023-07-09 MED ORDER — ASPIRIN 81 MG PO CHEW
81.0000 mg | CHEWABLE_TABLET | Freq: Every day | ORAL | Status: DC
  Administered 2023-07-10 – 2023-07-17 (×6): 81 mg via ORAL
  Filled 2023-07-09 (×6): qty 1

## 2023-07-09 MED ORDER — SODIUM CHLORIDE 0.9 % IV SOLN: INTRAVENOUS | Status: AC

## 2023-07-09 MED ORDER — DEXTROSE 50 % IV SOLN
50.0000 mL | Freq: Once | INTRAVENOUS | Status: AC
  Administered 2023-07-09: 50 mL via INTRAVENOUS

## 2023-07-09 MED ORDER — METOPROLOL SUCCINATE ER 25 MG PO TB24
12.5000 mg | ORAL_TABLET | Freq: Every day | ORAL | Status: DC
  Administered 2023-07-11 – 2023-07-16 (×4): 12.5 mg via ORAL
  Filled 2023-07-09 (×4): qty 1

## 2023-07-09 MED ORDER — ATORVASTATIN CALCIUM 20 MG PO TABS
80.0000 mg | ORAL_TABLET | Freq: Every day | ORAL | Status: DC
  Administered 2023-07-10 – 2023-07-17 (×6): 80 mg via ORAL
  Filled 2023-07-09 (×6): qty 4

## 2023-07-09 MED ORDER — ENOXAPARIN SODIUM 30 MG/0.3ML IJ SOSY: 30.0000 mg | PREFILLED_SYRINGE | INTRAMUSCULAR | Status: DC

## 2023-07-09 MED ORDER — ONDANSETRON HCL 4 MG/2ML IJ SOLN
4.0000 mg | Freq: Four times a day (QID) | INTRAMUSCULAR | Status: DC | PRN
  Administered 2023-07-15: 4 mg via INTRAVENOUS
  Filled 2023-07-09: qty 2

## 2023-07-09 MED ORDER — ONDANSETRON HCL 4 MG PO TABS: 4.0000 mg | ORAL_TABLET | Freq: Four times a day (QID) | ORAL | Status: DC | PRN

## 2023-07-09 MED ORDER — ACETAMINOPHEN 325 MG PO TABS
650.0000 mg | ORAL_TABLET | Freq: Four times a day (QID) | ORAL | Status: DC | PRN
  Administered 2023-07-17: 650 mg via ORAL
  Filled 2023-07-09: qty 2

## 2023-07-09 MED ORDER — DEXTROSE 50 % IV SOLN
1.0000 | Freq: Once | INTRAVENOUS | Status: AC
Start: 2023-07-09 — End: 2023-07-09
  Administered 2023-07-09: 50 mL via INTRAVENOUS
  Filled 2023-07-09: qty 50

## 2023-07-09 MED ORDER — SODIUM BICARBONATE 8.4 % IV SOLN
INTRAVENOUS | Status: DC
  Filled 2023-07-09 (×2): qty 1000
  Filled 2023-07-09 (×2): qty 150
  Filled 2023-07-09 (×3): qty 1000
  Filled 2023-07-09: qty 150

## 2023-07-09 MED ORDER — PANTOPRAZOLE SODIUM 40 MG PO TBEC
40.0000 mg | DELAYED_RELEASE_TABLET | Freq: Every day | ORAL | Status: DC
  Administered 2023-07-10 – 2023-07-17 (×6): 40 mg via ORAL
  Filled 2023-07-09 (×6): qty 1

## 2023-07-09 MED ORDER — SODIUM CHLORIDE 0.9 % IV BOLUS
1000.0000 mL | Freq: Once | INTRAVENOUS | Status: AC
  Administered 2023-07-09: 1000 mL via INTRAVENOUS

## 2023-07-09 NOTE — ED Notes (Signed)
Pt's CBG 64, giving cup of apple juice now. Dr. Arville Care notified, orders to follow. Pt remains awake, a&ox4

## 2023-07-09 NOTE — ED Provider Notes (Signed)
[facility] Provider Note    Event Date/Time   First MD Initiated Contact with Patient 07/09/23 2117     (approximate)   History   No chief complaint on file.   HPI  Sarah Lane is a 57 y.o. female who is status post cholecystectomy who comes in with concerns for urinary retention.  Patient reports that she is not urinated at all since yesterday.  She states that she has eaten something but her sugars are very low here.  She also does report some upper abdominal discomfort.  She is unsure when this started denies any overt chest pain, shortness of breath.   Physical Exam   Triage Vital Signs: ED Triage Vitals  Encounter Vitals Group     BP 07/09/23 2058 103/67     Systolic BP Percentile --      Diastolic BP Percentile --      Pulse Rate 07/09/23 2058 77     Resp 07/09/23 2058 20     Temp 07/09/23 2058 (!) 97.5 F (36.4 C)     Temp Source 07/09/23 2058 Oral     SpO2 07/09/23 2058 100 %     Weight 07/09/23 2059 135 lb (61.2 kg)     Height 07/09/23 2059 5' (1.524 m)     Head Circumference --      Peak Flow --      Pain Score 07/09/23 2058 0     Pain Loc --      Pain Education --      Exclude from Growth Chart --     Most recent vital signs: Vitals:   07/09/23 2058  BP: 103/67  Pulse: 77  Resp: 20  Temp: (!) 97.5 F (36.4 C)  SpO2: 100%     General: Awake, no distress.  CV:  Good peripheral perfusion.  Resp:  Normal effort.  Abd:  No distention.  Tenderness in the epigastric region Other:     ED Results / Procedures / Treatments   Labs (all labs ordered are listed, but only abnormal results are displayed) Labs Reviewed  CBG MONITORING, ED - Abnormal; Notable for the following components:      Result Value   Glucose-Capillary 45 (*)    All other components within normal limits  URINALYSIS, ROUTINE W REFLEX MICROSCOPIC  LIPASE, BLOOD  COMPREHENSIVE METABOLIC PANEL  CBC WITH DIFFERENTIAL/PLATELET     EKG  My  interpretation of EKG:  Normal sinus rate 79 without any ST elevation or T wave inversions, normal intervals  RADIOLOGY I have reviewed the CT abdomen personally and interpreted and no signs of retained urine in the bladder.   PROCEDURES:  Critical Care performed: No  .Critical Care  Performed by: Concha Se, MD Authorized by: Concha Se, MD   Critical care provider statement:    Critical care time (minutes):  30   Critical care was necessary to treat or prevent imminent or life-threatening deterioration of the following conditions:  Endocrine crisis   Critical care was time spent personally by me on the following activities:  Development of treatment plan with patient or surrogate, discussions with consultants, evaluation of patient's response to treatment, examination of patient, ordering and review of laboratory studies, ordering and review of radiographic studies, ordering and performing treatments and interventions, pulse oximetry, re-evaluation of patient's condition and review of old charts .1-3 Lead EKG Interpretation  Performed by: Concha Se, MD Authorized by: Concha Se, MD  Interpretation: normal     ECG rate:  77   ECG rate assessment: normal     Rhythm: sinus rhythm     Ectopy: none     Conduction: normal      MEDICATIONS ORDERED IN ED: Medications  dextrose 50 % solution 50 mL (50 mLs Intravenous Given 07/09/23 2111)     IMPRESSION / MDM / ASSESSMENT AND PLAN / ED COURSE  I reviewed the triage vital signs and the nursing notes.   Patient's presentation is most consistent with acute presentation with potential threat to life or bodily function.   Patient comes in with concern for urinary retention will get bladder scan but I wonder if patient could just be really dehydrated given her glucose was also significantly low.  Patient was given amp of dextrose.  Patient was given some dextrose will get CT imaging given she reports some abdominal  discomfort.  Given she has a history of CKD we will place patient on the cardiac monitor in case she has any hyperkalemia.  Repeat glucose is stable will need to continue to closely monitor.  Lipase is elevated but chronically elevated.  She has got signs of new renal failure with a creatinine of 5.71.  Bladder scan without evidence of retention.  Potassium only slightly elevated at 5.2 anion gap elevated at 25.  Patient CT imaging overall reassuring.  Her troponin is slightly elevated but downtrending from 3 months ago we will continue to trend out.    The patient is on the cardiac monitor to evaluate for evidence of arrhythmia and/or significant heart rate changes.      FINAL CLINICAL IMPRESSION(S) / ED DIAGNOSES   Final diagnoses:  Acute renal failure, unspecified acute renal failure type (HCC)  Hypoglycemia     Rx / DC Orders   ED Discharge Orders     None        Note:  This document was prepared using Dragon voice recognition software and may include unintentional dictation errors.   Concha Se, MD 07/09/23 [id number]

## 2023-07-09 NOTE — H&P (Signed)
[place]   PATIENT NAME: Sarah Lane    MR#:  [id number]  DATE OF BIRTH:  [birth year]  DATE OF ADMISSION:  07/09/2023  PRIMARY CARE PHYSICIAN: [facility], [facility]   Patient is coming from: Home  REQUESTING/REFERRING PHYSICIAN: Artis Delay, MD  CHIEF COMPLAINT:  "I couldn't pee"   HISTORY OF PRESENT ILLNESS:  Sarah Lane is a 57 y.o. female with medical history significant for asthma, HFrEF, type 2 diabetes mellitus, hypertension, PSVT and cardiomyopathy, who presented to the emergency room with acute onset of urinary retention.  The patient is not urinated at all since yesterday.  She stated that she has eaten something but her sugar were still very low.  Her appetite has been diminished though.  She admitted to upper abdominal discomfort without nausea or vomiting or diarrhea or constipation.  No dysuria, or urinary frequency or urgency or flank pain.  No chest pain or palpitations.  No cough or wheezing or dyspnea.  No fever or chills.  She had a recent cholecystectomy.  ED Course: When she came to the ER, vital signs were within normal.  Labs revealed hyponatremia 131 and hypochloremia of 90 with CO2 of 16 and blood glucose of 53, BUN of 72 and creatinine 5.71 compared to 41 and 2.45 on 06/23/2023, anion gap of 25 compared to 18 then, serum lipase of 120 and total bili 1.3, high sensitive troponin I of 44 and CBC showing leukocytosis of 14.8 with neutrophilia EKG as reviewed by me : None Imaging: Abdominal and pelvic CT scan showed the following: 1. Evidence of prior cholecystectomy. 2. Stable bilateral renal cysts. No follow-up imaging is recommended. This recommendation follows ACR consensus guidelines: Management of the Incidental Renal Mass on CT: A White Paper of the ACR Incidental Findings Committee. J Am Coll Radiol 2018;15:264-273. 3. Fat-containing ventral hernia. 4. Chronic compression deformities involving the T12 and L1 vertebral bodies. 5. Aortic  atherosclerosis.  The patient was given an amp of D50 that was later on repeated, 1 L bolus of IV normal saline and will be admitted to a medical bed for further evaluation and management. PAST MEDICAL HISTORY:   Past Medical History:  Diagnosis Date   Aortic atherosclerosis (HCC)    a. 03/2023 noted on CT.   Asthma    childhood asthma   Cancer (HCC)    ovarian cancer   Cardiomyopathy (HCC)    a. 01/2023 [facility] EF 15-20%, impaired relaxation, mildly to mod dil RV w/ mild RV dysfxn, mod PH, BAE; b. 03/2023 [facility]: EF 30-35%, glob HK, GrI DD, mod reduced RV fxn, mod enlarged RV, RVSP 42.4mmHg, mild MR, mild-mod TR.   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 Dx 01/2023-->[facility]: EF 15-20%; b. 03/2023 [facility]: EF 30-35%.   Diabetes (HCC)    Diabetes mellitus without complication (HCC)    Expressive aphasia    a. 01/2023 L MCA occlusive stroke s/p thrombectomy.   Family history of adverse reaction to anesthesia    Grand father had facial swelling   H/O ischemic left MCA stroke    a. 01/2023 s/p admission @ [facility]. CTA Head w/ M1 occlusion of L MCA, possible high-grade stenosis of distal A3 segment. S/p thrombectomy.   Hypertension    PSVT (paroxysmal supraventricular tachycardia) (HCC)    Right lower lobe pulmonary nodule    a. 03/2023 CT Chest - 14mm - stable.  Seizures (HCC)    petit mals at age 83 treated with medications. Last one age 57    PAST SURGICAL HISTORY:   Past Surgical History:  Procedure Laterality Date   ABDOMINAL HYSTERECTOMY  2011   ovarian cancer   IRRIGATION AND DEBRIDEMENT ABDOMEN N/A 12/31/2014   Procedure: IRRIGATION AND DEBRIDEMENT ABDOMINAL WALL ABSCESS;  Surgeon: Luretha Murphy, MD;  Location: [facility] ORS;  Service: General;  Laterality: N/A;   LYSIS OF ADHESION  06/23/2023   Procedure: LYSIS OF ADHESION;  Surgeon: Campbell Lerner, MD;  Location: [facility] ORS;  Service: General;;    RIGHT/LEFT HEART CATH AND CORONARY ANGIOGRAPHY N/A 04/06/2023   Procedure: RIGHT/LEFT HEART CATH AND CORONARY ANGIOGRAPHY;  Surgeon: Yvonne Kendall, MD;  Location: [facility] INVASIVE CV LAB;  Service: Cardiovascular;  Laterality: N/A;  -Cholecystectomy  SOCIAL HISTORY:   Social History   Tobacco Use   Smoking status: Never    Passive exposure: Never   Smokeless tobacco: Not on file  Substance Use Topics   Alcohol use: No    FAMILY HISTORY:   Family History  Problem Relation Age of Onset   CAD Father        s/p CABG x 2 and stenting   Parkinson's disease Father     DRUG ALLERGIES:   Allergies  Allergen Reactions   Jardiance [Empagliflozin]     Multiple side effect complications including: DKA, pancreatitis, renal failure. Avoid re-prescribing   Codeine Nausea And Vomiting   Ibuprofen Nausea And Vomiting    REVIEW OF SYSTEMS:   ROS As per history of present illness. All pertinent systems were reviewed above. Constitutional, HEENT, cardiovascular, respiratory, GI, GU, musculoskeletal, neuro, psychiatric, endocrine, integumentary and hematologic systems were reviewed and are otherwise negative/unremarkable except for positive findings mentioned above in the HPI.   MEDICATIONS AT HOME:   Prior to Admission medications   Medication Sig Start Date End Date Taking? Authorizing Provider  aspirin 81 MG chewable tablet Chew 81 mg by mouth in the morning.    [provider]  atorvastatin (LIPITOR) 80 MG tablet Take 80 mg by mouth in the morning.    [provider]  furosemide (LASIX) 40 MG tablet Take 1 tablet (40 mg total) by mouth daily. Patient taking differently: Take 40 mg by mouth daily as needed (fluid retention/weight gain 3 lbs or more.). 04/19/23   Antonieta Iba, MD  losartan (COZAAR) 25 MG tablet Take 1 tablet (25 mg total) by mouth daily. Patient taking differently: Take 12.5 mg by mouth in the morning. 04/19/23   Antonieta Iba, MD  metFORMIN  (GLUCOPHAGE) 1000 MG tablet Take 1,000 mg by mouth 2 (two) times daily with a meal.    [provider]  metoprolol succinate (TOPROL-XL) 25 MG 24 hr tablet Take 12.5 mg by mouth in the morning.    [provider]  omeprazole (PRILOSEC) 40 MG capsule Take 1 capsule (40 mg total) by mouth daily. 06/04/23 07/04/23  Lucile Shutters, MD      VITAL SIGNS:  Blood pressure 101/60, pulse 83, temperature (!) 97.5 F (36.4 C), temperature source Oral, resp. rate 18, height 5' (1.524 m), weight 61.2 kg, SpO2 100%.  PHYSICAL EXAMINATION:  Physical Exam  GENERAL:  57 y.o.-year-old patient lying in the bed with no acute distress.  EYES: Pupils equal, round, reactive to light and accommodation. No scleral icterus. Extraocular muscles intact.  HEENT: Head atraumatic, normocephalic. Oropharynx and nasopharynx clear.  NECK:  Supple, no jugular venous distention. No thyroid enlargement,  no tenderness.  LUNGS: Normal breath sounds bilaterally, no wheezing, rales,rhonchi or crepitation. No use of accessory muscles of respiration.  CARDIOVASCULAR: Regular rate and rhythm, S1, S2 normal. No murmurs, rubs, or gallops.  ABDOMEN: Soft, nondistended, nontender. Bowel sounds present. No organomegaly or mass.  EXTREMITIES: No pedal edema, cyanosis, or clubbing.  NEUROLOGIC: Cranial nerves II through XII are intact. Muscle strength 5/5 in all extremities. Sensation intact. Gait not checked.  PSYCHIATRIC: The patient is alert and oriented x 3.  Normal affect and good eye contact. SKIN: No obvious rash, lesion, or ulcer.   LABORATORY PANEL:   CBC Recent Labs  Lab 07/09/23 2115  WBC 14.8*  HGB 13.8  HCT 41.7  PLT 296   ------------------------------------------------------------------------------------------------------------------  Chemistries  Recent Labs  Lab 07/09/23 2115  NA 131*  K 5.2*  CL 90*  CO2 16*  GLUCOSE 53*  BUN 72*  CREATININE 5.71*  CALCIUM 9.9  AST 26  ALT 14   ALKPHOS 52  BILITOT 1.3*   ------------------------------------------------------------------------------------------------------------------  Cardiac Enzymes No results for input(s): "TROPONINI" in the last 168 hours. ------------------------------------------------------------------------------------------------------------------  RADIOLOGY:  CT ABDOMEN PELVIS WO CONTRAST  Result Date: 07/09/2023 CLINICAL DATA:  Status post cholecystectomy 3 weeks ago, presenting with abdominal tenderness on physical exam. EXAM: CT ABDOMEN AND PELVIS WITHOUT CONTRAST TECHNIQUE: Multidetector CT imaging of the abdomen and pelvis was performed following the standard protocol without IV contrast. RADIATION DOSE REDUCTION: This exam was performed according to the departmental dose-optimization program which includes automated exposure control, adjustment of the mA and/or kV according to patient size and/or use of iterative reconstruction technique. COMPARISON:  June 01, 2023 FINDINGS: Lower chest: A stable, benign 15 mm noncalcified lung nodule is seen within the posterior aspect of the right lung base. An additional stable 3 mm posterolateral right basilar lung nodule is noted (axial CT image 12, CT series 4). Hepatobiliary: No focal liver abnormality is seen. Status post cholecystectomy. No biliary dilatation. Pancreas: Unremarkable. No pancreatic ductal dilatation or surrounding inflammatory changes. Spleen: Normal in size without focal abnormality. Adrenals/Urinary Tract: Adrenal glands are unremarkable. Kidneys are normal in size, without renal calculi or hydronephrosis. A 1.7 cm diameter cyst is seen within the posterolateral aspect of the mid to lower right kidney. An additional 2.0 cm diameter cyst is seen within the anterior aspect of the mid left kidney. The urinary bladder is poorly distended and subsequently limited in evaluation. Stomach/Bowel: Stomach is within normal limits. Appendix appears normal. No  evidence of bowel wall thickening, distention, or inflammatory changes. Vascular/Lymphatic: Aortic atherosclerosis. No enlarged abdominal or pelvic lymph nodes. Reproductive: Status post hysterectomy. No adnexal masses. Other: A 2.1 cm x 1.8 cm x 3.0 cm fat containing ventral defect is seen at the level of the umbilicus. No associated fluid collection or inflammatory stranding is seen. No abdominopelvic ascites. Musculoskeletal: Chronic compression deformities are seen involving the T12 and L1 vertebral bodies. No acute osseous abnormalities are identified. IMPRESSION: 1. Evidence of prior cholecystectomy. 2. Stable bilateral renal cysts. No follow-up imaging is recommended. This recommendation follows ACR consensus guidelines: Management of the Incidental Renal Mass on CT: A White Paper of the ACR Incidental Findings Committee. J Am Coll Radiol 2018;15:264-273. 3. Fat-containing ventral hernia. 4. Chronic compression deformities involving the T12 and L1 vertebral bodies. 5. Aortic atherosclerosis. Aortic Atherosclerosis (ICD10-I70.0). Electronically Signed   By: Aram Candela M.D.   On: 07/09/2023 22:25      IMPRESSION AND PLAN:  Assessment and Plan: * Acute kidney injury superimposed  on chronic kidney disease (HCC) - The patient be admitted to a medical bed. - We will continue hydration with IV normal saline and follow BMP. - We will avoid nephrotoxins. - We will monitor urine output.  Uncontrolled type 2 diabetes mellitus with hypoglycemia, with long-term current use of insulin (HCC) - The patient was placed on IV D5 W with added sodium bicarb. - She will be placed on hypoglycemia protocol. - We will hold off antidiabetics.  Dyslipidemia - We will continue statin therapy.  Essential hypertension - We will hold off Cozaar and continue Toprol-XL.  GERD without esophagitis - We will continue PPI therapy.   DVT prophylaxis: Lovenox.  Advanced Care Planning:  Code Status: full code.   Family Communication:  The plan of care was discussed in details with the patient (and family). I answered all questions. The patient agreed to proceed with the above mentioned plan. Further management will depend upon hospital course. Disposition Plan: Back to previous home environment Consults called: none.  All the records are reviewed and case discussed with ED provider.  Status is: Inpatient  At the time of the admission, it appears that the appropriate admission status for this patient is inpatient.  This is judged to be reasonable and necessary in order to provide the required intensity of service to ensure the patient's safety given the presenting symptoms, physical exam findings and initial radiographic and laboratory data in the context of comorbid conditions.  The patient requires inpatient status due to high intensity of service, high risk of further deterioration and high frequency of surveillance required.  I certify that at the time of admission, it is my clinical judgment that the patient will require inpatient hospital care extending more than 2 midnights.                            Dispo: The patient is from: Home              Anticipated d/c is to: Home              Patient currently is not medically stable to d/c.              Difficult to place patient: No  Hannah Beat M.D on 07/10/2023 at 12:18 AM  [facility]   From 7 PM-7 AM, contact night-coverage [email or web address]  CC: Primary care physician; Care, [facility]

## 2023-07-09 NOTE — ED Triage Notes (Addendum)
Pt BIB [facility] from home and presents with inability to urinate. Pt had cholecystectomy 3 weeks ago. EMS advised pt has a tender abd on exam. Pt states she developed hernia's after surgery and they are tender today.   HR 70 CBG 68 116/72

## 2023-07-10 ENCOUNTER — Inpatient Hospital Stay: Payer: Medicaid Other

## 2023-07-10 DIAGNOSIS — E119 Type 2 diabetes mellitus without complications: Secondary | ICD-10-CM

## 2023-07-10 DIAGNOSIS — N179 Acute kidney failure, unspecified: Secondary | ICD-10-CM | POA: Diagnosis not present

## 2023-07-10 DIAGNOSIS — E162 Hypoglycemia, unspecified: Secondary | ICD-10-CM | POA: Diagnosis present

## 2023-07-10 DIAGNOSIS — N189 Chronic kidney disease, unspecified: Secondary | ICD-10-CM | POA: Diagnosis not present

## 2023-07-10 DIAGNOSIS — K219 Gastro-esophageal reflux disease without esophagitis: Secondary | ICD-10-CM | POA: Diagnosis present

## 2023-07-10 LAB — CBG MONITORING, ED
Glucose-Capillary: 110 mg/dL — ABNORMAL HIGH (ref 70–99)
Glucose-Capillary: 134 mg/dL — ABNORMAL HIGH (ref 70–99)
Glucose-Capillary: 144 mg/dL — ABNORMAL HIGH (ref 70–99)
Glucose-Capillary: 164 mg/dL — ABNORMAL HIGH (ref 70–99)
Glucose-Capillary: 32 mg/dL — CL (ref 70–99)
Glucose-Capillary: 68 mg/dL — ABNORMAL LOW (ref 70–99)
Glucose-Capillary: 73 mg/dL (ref 70–99)
Glucose-Capillary: 76 mg/dL (ref 70–99)
Glucose-Capillary: 78 mg/dL (ref 70–99)
Glucose-Capillary: 78 mg/dL (ref 70–99)
Glucose-Capillary: 84 mg/dL (ref 70–99)
Glucose-Capillary: 85 mg/dL (ref 70–99)
Glucose-Capillary: 86 mg/dL (ref 70–99)
Glucose-Capillary: 91 mg/dL (ref 70–99)
Glucose-Capillary: 92 mg/dL (ref 70–99)
Glucose-Capillary: 93 mg/dL (ref 70–99)
Glucose-Capillary: 98 mg/dL (ref 70–99)

## 2023-07-10 LAB — GLUCOSE, CAPILLARY: Glucose-Capillary: 149 mg/dL — ABNORMAL HIGH (ref 70–99)

## 2023-07-10 LAB — BASIC METABOLIC PANEL
Anion gap: 23 — ABNORMAL HIGH (ref 5–15)
Anion gap: 22 — ABNORMAL HIGH (ref 5–15)
BUN: 73 mg/dL — ABNORMAL HIGH (ref 6–20)
BUN: 73 mg/dL — ABNORMAL HIGH (ref 6–20)
CO2: 15 mmol/L — ABNORMAL LOW (ref 22–32)
CO2: 18 mmol/L — ABNORMAL LOW (ref 22–32)
Calcium: 9 mg/dL (ref 8.9–10.3)
Calcium: 8.9 mg/dL (ref 8.9–10.3)
Chloride: 95 mmol/L — ABNORMAL LOW (ref 98–111)
Chloride: 90 mmol/L — ABNORMAL LOW (ref 98–111)
Creatinine, Ser: 5.85 mg/dL — ABNORMAL HIGH (ref 0.44–1.00)
Creatinine, Ser: 6.22 mg/dL — ABNORMAL HIGH (ref 0.44–1.00)
GFR, Estimated: 8 mL/min — ABNORMAL LOW (ref >60)
GFR, Estimated: 7 mL/min — ABNORMAL LOW (ref >60)
Glucose, Bld: 82 mg/dL (ref 70–99)
Glucose, Bld: 171 mg/dL — ABNORMAL HIGH (ref 70–99)
Potassium: 3.9 mmol/L (ref 3.5–5.1)
Potassium: 4.1 mmol/L (ref 3.5–5.1)
Sodium: 133 mmol/L — ABNORMAL LOW (ref 135–145)
Sodium: 130 mmol/L — ABNORMAL LOW (ref 135–145)

## 2023-07-10 LAB — CBC
HCT: 34.3 % — ABNORMAL LOW (ref 36.0–46.0)
Hemoglobin: 11.5 g/dL — ABNORMAL LOW (ref 12.0–15.0)
MCH: 29.9 pg (ref 26.0–34.0)
MCHC: 33.5 g/dL (ref 30.0–36.0)
MCV: 89.3 fL (ref 80.0–100.0)
Platelets: 202 10*3/uL (ref 150–400)
RBC: 3.84 MIL/uL — ABNORMAL LOW (ref 3.87–5.11)
RDW: 13.2 % (ref 11.5–15.5)
WBC: 10.7 10*3/uL — ABNORMAL HIGH (ref 4.0–10.5)
nRBC: 0 % (ref 0.0–0.2)

## 2023-07-10 MED ORDER — SODIUM CHLORIDE 0.9% FLUSH: 3.0000 mL | INTRAVENOUS | Status: DC | PRN

## 2023-07-10 MED ORDER — HEPARIN SODIUM (PORCINE) 5000 UNIT/ML IJ SOLN
5000.0000 [IU] | Freq: Two times a day (BID) | INTRAMUSCULAR | Status: DC
  Administered 2023-07-10 – 2023-07-16 (×9): 5000 [IU] via SUBCUTANEOUS
  Filled 2023-07-10 (×9): qty 1

## 2023-07-10 MED ORDER — SODIUM CHLORIDE 0.9 % IV SOLN
250.0000 mL | INTRAVENOUS | Status: AC | PRN
Start: 2023-07-10 — End: 2023-07-11

## 2023-07-10 MED ORDER — DEXTROSE 50 % IV SOLN
25.0000 g | INTRAVENOUS | Status: AC
  Administered 2023-07-10: 25 g via INTRAVENOUS
  Filled 2023-07-10: qty 50

## 2023-07-10 MED ORDER — SODIUM CHLORIDE 0.9% FLUSH
3.0000 mL | Freq: Two times a day (BID) | INTRAVENOUS | Status: DC
  Administered 2023-07-10 – 2023-07-16 (×8): 3 mL via INTRAVENOUS

## 2023-07-10 NOTE — Assessment & Plan Note (Addendum)
See hypoglycemia for mgmt. Hold metformin.

## 2023-07-10 NOTE — Assessment & Plan Note (Addendum)
Hold home Cozaar with renal failure. Continue Toprol-XL

## 2023-07-10 NOTE — ED Notes (Signed)
Pt is alert and oriented x4. Pt was given 1 cup of OJ to drink.

## 2023-07-10 NOTE — Assessment & Plan Note (Signed)
POA, suspect due to severe AKI. CBG was 45 on admission & having recurrent episodes.   CBG this AM was 32, improved to 164 after D50.   --Hypoglycemia protocol --Q4H CBG's --Hold any insulin or anti-diabetic meds

## 2023-07-10 NOTE — Assessment & Plan Note (Addendum)
On statin.

## 2023-07-10 NOTE — Assessment & Plan Note (Signed)
Resolved.  Due to acute renal failure. Monitor BMP. Off IV fluids

## 2023-07-10 NOTE — Assessment & Plan Note (Addendum)
Cr 5.85 on admission >> 6.22 on repeat BMP today Suspect ATN.  Renal U/S without obstruction. --Nephrology consulted --Continue IV fluids --Strict I/O's to monitor urine output --Bladder scans PRN --Avoid nephrotoxins & hypotension

## 2023-07-10 NOTE — Progress Notes (Signed)
Progress Note   Patient: Sarah Lane DOB: 28-Jan-1966 DOA: 07/09/2023     1 DOS: the patient was seen and examined on 07/10/2023   Brief hospital course: HPI on admission: "Sarah Lane is a 57 y.o. female with medical history significant for asthma, HFrEF, type 2 diabetes mellitus, hypertension, PSVT and cardiomyopathy, who presented to the emergency room with acute onset of urinary retention.  The patient is not urinated at all since yesterday.  She stated that she has eaten something but her sugar were still very low.  Her appetite has been diminished though.  She admitted to upper abdominal discomfort without nausea or vomiting or diarrhea or constipation. ..."  See H&P for full HPI on admission & ED course.  Pt was found to have acute renal failure with Cr of 5.71, up from 2.45 recently on 06/23/2023.    Admitted to hospitalist service for further evaluation and management with nephrology consulted.   Assessment and Plan:  * Acute kidney injury superimposed on chronic kidney disease (HCC) Cr 5.85 on admission >> 6.22 on repeat BMP today Suspect ATN.  Renal U/S without obstruction. --Nephrology consulted --Continue IV fluids --Strict I/O's to monitor urine output --Bladder scans PRN --Avoid nephrotoxins & hypotension   Hypoglycemia POA, suspect due to severe AKI. CBG was 45 on admission & having recurrent episodes.   CBG this AM was 32, improved to 164 after D50.   --Hypoglycemia protocol --Q4H CBG's --Hold any insulin or anti-diabetic meds  Non-insulin dependent type 2 diabetes mellitus (HCC) See hypoglycemia for mgmt. Hold metformin.  Dyslipidemia On statin  Essential hypertension Hold home Cozaar with renal failure. Continue Toprol-XL  GERD without esophagitis On PPI  High anion gap metabolic acidosis Due to acute renal failure. Monitor BMP. On IV fluids        Subjective: Pt seen in ED holding for a bed today.  She reports being  very tired and weak feeling. Denies N/V, but had intermittent nausea at home.  Had cholecystectomy about 3 weeks ago.  Reports intermittent diarrhea since then.  Says her recovery has been so-so, but could not clarify further what that means.  Denies abdominal pain, fever/chills.  Physical Exam: Vitals:   07/10/23 1430 07/10/23 1500 07/10/23 1530 07/10/23 1549  BP: 132/74 124/65 118/75   Pulse: 79 80 85   Resp: (!) 8 19 14    Temp:    98.1 F (36.7 C)  TempSrc:    Oral  SpO2: 98% 97% 100%   Weight:      Height:       General exam: awake, alert, no acute distress HEENT: moist mucus membranes, hearing grossly normal  Respiratory system: CTAB, no wheezes, rales or rhonchi, normal respiratory effort. Cardiovascular system: normal S1/S2, RRR, no JVD, murmurs, rubs, gallops,  no pedal edema.   Gastrointestinal system: soft, NT, ND, no HSM felt, +bowel sounds. Central nervous system: A&O x 3. no gross focal neurologic deficits, normal speech with expressive aphasia Extremities: no edema, normal tone Skin: dry, intact, normal temperature Psychiatry: normal mood, congruent affect, judgement and insight appear normal   Data Reviewed:  Notable labs ---  Na 133 >> 130, Cl 90, bicarb 18, gap 22, glucose 171, BUN 73, Cr increased to 6.22, GFR 7   Renal U/S -- no obstruction   Family Communication: none present. Will attempt to call as time allows.    Disposition: Status is: Inpatient Remains inpatient appropriate because: worsening renal failure and hypoglycemia today   Planned  Discharge Destination: Home    Time spent: 42 minutes  Author: Pennie Banter, DO 07/10/2023 5:30 PM  For on call review [email or web address].

## 2023-07-10 NOTE — Progress Notes (Signed)
[facility]  ROUNDING NOTE   Subjective:   Sarah Lane  is a 57 y.o. female with a PMHx of diabetes mellitus type 2, seizure disorder, hypertension, dysrhythmia, and history of ovarian cancer.  Patient presents to the emergency department with difficulty urinating and has been admitted for Acute kidney injury superimposed on chronic kidney disease (HCC) [N17.9, N18.9]  Patient is known to our practice from previous admission.  While reviewing history, patient underwent a laparoscopic cholecystectomy on 06/23/2023.  Patient states procedure went well.  Complains of diarrhea since that time.  Patient states she began experiencing difficulty with urination yesterday.  Reports poor appetite, denies nausea or vomiting.  Denies chest pain or discomfort.  Remains on room air with no lower extremity edema  Labs on ED arrival concerning for sodium 131, potassium 5.2, serum bicarb 16, glucose 53, BUN 72, creatinine 5.71 with GFR 8, lipase 120, troponin 44, and WBCs 14.8.  CT abdomen pelvis showed a 1.7 cm cyst in the right kidney and a 2.0 cm cyst in the left kidney.  Bladder distention poorly noted.  We have been consulted to evaluate acute kidney injury.   Objective:  Vital signs in last 24 hours:  Temp:  [97.5 F (36.4 C)-98.3 F (36.8 C)] 98 F (36.7 C) (11/30 1200) Pulse Rate:  [74-83] 79 (11/30 1300) Resp:  [10-20] 17 (11/30 1300) BP: (87-124)/(40-72) 106/64 (11/30 1300) SpO2:  [95 %-100 %] 96 % (11/30 1300) Weight:  [61.2 kg] 61.2 kg (11/29 2059)  Weight change:  Filed Weights   07/09/23 2059  Weight: 61.2 kg    Intake/Output: I/O last 3 completed shifts: In: 249.8 [IV Piggyback:249.8] Out: -    Intake/Output this shift:  No intake/output data recorded.  Physical Exam: General: NAD  Head: Normocephalic, atraumatic.  Dry oral mucosal membranes  Eyes: Anicteric  Lungs:  Clear to auscultation, normal effort  Heart: Regular rate and rhythm  Abdomen:  Soft,  nontender, nondistended  Extremities: No peripheral edema.  Neurologic: Alert and oriented, moving all four extremities  Skin: No lesions  Access: None    Basic Metabolic Panel: Recent Labs  Lab 07/09/23 2115 07/10/23 0415 07/10/23 0910  NA 131* 133* 130*  K 5.2* 3.9 4.1  CL 90* 95* 90*  CO2 16* 15* 18*  GLUCOSE 53* 82 171*  BUN 72* 73* 73*  CREATININE 5.71* 5.85* 6.22*  CALCIUM 9.9 9.0 8.9    Liver Function Tests: Recent Labs  Lab 07/09/23 2115  AST 26  ALT 14  ALKPHOS 52  BILITOT 1.3*  PROT 7.8  ALBUMIN 4.5   Recent Labs  Lab 07/09/23 2115  LIPASE 120*   No results for input(s): "AMMONIA" in the last 168 hours.  CBC: Recent Labs  Lab 07/09/23 2115 07/10/23 0415  WBC 14.8* 10.7*  NEUTROABS 9.9*  --   HGB 13.8 11.5*  HCT 41.7 34.3*  MCV 90.3 89.3  PLT 296 202    Cardiac Enzymes: No results for input(s): "CKTOTAL", "CKMB", "CKMBINDEX", "TROPONINI" in the last 168 hours.  BNP: Invalid input(s): "POCBNP"  CBG: Recent Labs  Lab 07/10/23 1010 07/10/23 1058 07/10/23 1200 07/10/23 1301 07/10/23 1400  GLUCAP 144* 134* 93 86 85    Microbiology: Results for orders placed or performed during the hospital encounter of 06/01/23  Urine Culture     Status: Abnormal   Collection Time: 06/01/23  6:05 PM   Specimen: Urine, Random  Result Value Ref Range Status   Specimen Description   Final  URINE, RANDOM Performed at [facility], [address] [place], [place] [address]    Special Requests   Final    NONE Performed at [facility], [address] [place], [place] [address]    Culture MULTIPLE SPECIES PRESENT, SUGGEST RECOLLECTION (A)  Final   Report Status 06/03/2023 FINAL  Final    Coagulation Studies: No results for input(s): "LABPROT", "INR" in the last 72 hours.  Urinalysis: No results for input(s): "COLORURINE", "LABSPEC", "PHURINE", "GLUCOSEU", "HGBUR", "BILIRUBINUR", "KETONESUR", "PROTEINUR", "UROBILINOGEN",  "NITRITE", "LEUKOCYTESUR" in the last 72 hours.  Invalid input(s): "APPERANCEUR"    Imaging: US RENAL  Result Date: 07/10/2023 CLINICAL DATA:  Acute kidney injury EXAM: RENAL / URINARY TRACT ULTRASOUND COMPLETE COMPARISON:  CT 07/09/2023 FINDINGS: Right Kidney: Renal measurements: 11.1 x 5.6 x 5.6 cm = volume: 180 mL. Echogenicity within normal limits. Simple 2.1 cm cyst at the inferior pole of the right kidney. No solid mass, shadowing stone, or hydronephrosis visualized. Left Kidney: Renal measurements: 12.3 x 6.3 x 5.4 cm = volume: 217 mL. Echogenicity within normal limits. Simple 2.4 cm cyst at the interpolar region of the left kidney. No solid mass, shadowing stone, or hydronephrosis visualized. Bladder: Urinary bladder wall appears thickened, which may be accentuated by under-distension. Other: None. IMPRESSION: 1. No hydronephrosis. 2. Bilateral simple renal cysts which do not require follow-up imaging. 3. Urinary bladder wall appears thickened, which may be accentuated by under-distension. Correlate with urinalysis to exclude cystitis. Electronically Signed   By: Duanne Guess D.O.   On: 07/10/2023 11:53   CT ABDOMEN PELVIS WO CONTRAST  Result Date: 07/09/2023 CLINICAL DATA:  Status post cholecystectomy 3 weeks ago, presenting with abdominal tenderness on physical exam. EXAM: CT ABDOMEN AND PELVIS WITHOUT CONTRAST TECHNIQUE: Multidetector CT imaging of the abdomen and pelvis was performed following the standard protocol without IV contrast. RADIATION DOSE REDUCTION: This exam was performed according to the departmental dose-optimization program which includes automated exposure control, adjustment of the mA and/or kV according to patient size and/or use of iterative reconstruction technique. COMPARISON:  June 01, 2023 FINDINGS: Lower chest: A stable, benign 15 mm noncalcified lung nodule is seen within the posterior aspect of the right lung base. An additional stable 3 mm posterolateral  right basilar lung nodule is noted (axial CT image 12, CT series 4). Hepatobiliary: No focal liver abnormality is seen. Status post cholecystectomy. No biliary dilatation. Pancreas: Unremarkable. No pancreatic ductal dilatation or surrounding inflammatory changes. Spleen: Normal in size without focal abnormality. Adrenals/Urinary Tract: Adrenal glands are unremarkable. Kidneys are normal in size, without renal calculi or hydronephrosis. A 1.7 cm diameter cyst is seen within the posterolateral aspect of the mid to lower right kidney. An additional 2.0 cm diameter cyst is seen within the anterior aspect of the mid left kidney. The urinary bladder is poorly distended and subsequently limited in evaluation. Stomach/Bowel: Stomach is within normal limits. Appendix appears normal. No evidence of bowel wall thickening, distention, or inflammatory changes. Vascular/Lymphatic: Aortic atherosclerosis. No enlarged abdominal or pelvic lymph nodes. Reproductive: Status post hysterectomy. No adnexal masses. Other: A 2.1 cm x 1.8 cm x 3.0 cm fat containing ventral defect is seen at the level of the umbilicus. No associated fluid collection or inflammatory stranding is seen. No abdominopelvic ascites. Musculoskeletal: Chronic compression deformities are seen involving the T12 and L1 vertebral bodies. No acute osseous abnormalities are identified. IMPRESSION: 1. Evidence of prior cholecystectomy. 2. Stable bilateral renal cysts. No follow-up imaging is recommended. This recommendation follows ACR  consensus guidelines: Management of the Incidental Renal Mass on CT: A White Paper of the ACR Incidental Findings Committee. J Am Coll Radiol 2018;15:264-273. 3. Fat-containing ventral hernia. 4. Chronic compression deformities involving the T12 and L1 vertebral bodies. 5. Aortic atherosclerosis. Aortic Atherosclerosis (ICD10-I70.0). Electronically Signed   By: Aram Candela M.D.   On: 07/09/2023 22:25     Medications:    sodium  chloride     sodium chloride     sodium bicarbonate 150 mEq in dextrose 5 % 1,150 mL infusion 125 mL/hr at 07/10/23 1207    aspirin  81 mg Oral Daily   atorvastatin  80 mg Oral Daily   heparin injection (subcutaneous)  5,000 Units Subcutaneous Q12H   metoprolol succinate  12.5 mg Oral Daily   pantoprazole  40 mg Oral Daily   sodium chloride flush  3 mL Intravenous Q12H   sodium chloride, acetaminophen **OR** acetaminophen, magnesium hydroxide, ondansetron **OR** ondansetron (ZOFRAN) IV, sodium chloride flush, traZODone  Assessment/ Plan:  Sarah Lane is a 57 y.o.  female  is a 57 y.o. female with a PMHx of diabetes mellitus type 2, seizure disorder, hypertension, dysrhythmia, and history of ovarian cancer.  Patient presents to the emergency department complaining of difficulty with urination.  Patient has been admitted for Acute kidney injury superimposed on chronic kidney disease (HCC) [N17.9, N18.9]   Acute Kidney Injury on chronic kidney disease stage IIIa with baseline creatinine 1.27 and GFR of 49 on 04/07/23.  Acute kidney injury resulting in ATN secondary to volume depletion and continued ACE/ARB use. CT abdomen pelvis showed bilateral nonobstructing cyst.  Renal ultrasound confirms no obstruction and presence of simple renal cyst.  Also questionable for cystitis.  No IV contrast exposure.  Agree with IV fluids.  Continue holding losartan and metformin.  No acute indication for dialysis.  Will continue to monitor.  Lab Results  Component Value Date   CREATININE 6.22 (H) 07/10/2023   CREATININE 5.85 (H) 07/10/2023   CREATININE 5.71 (H) 07/09/2023    Intake/Output Summary (Last 24 hours) at 07/10/2023 1412 Last data filed at 07/09/2023 2315 Gross per 24 hour  Intake 249.75 ml  Output --  Net 249.75 ml   2.  Hyponatremia/hyperkalemia.  Potassium 5.2 on admission, corrected to 4.1 after shifting measures.  Sodium 131 on admission, now 130.  Likely due to poor oral intake in  combination with kidney injury.  This should improve with IV fluids.  3.  Acute metabolic acidosis.  Serum bicarb 16 on admission, now 18.  Agree with sodium bicarb infusion.  4. Anemia of chronic kidney disease Lab Results  Component Value Date   HGB 11.5 (L) 07/10/2023    Hemoglobin within desired range.  Will continue to monitor for now.  5. Diabetes mellitus type II with chronic kidney disease: noninsulin dependent. Home regimen includes metformin. Most recent hemoglobin A1c is 8.2 on 03/31/23.     LOS: 1 Kuba Shepherd 11/30/20242:12 PM

## 2023-07-10 NOTE — ED Notes (Addendum)
Pt attempted to use to bedside commode but was unable to urinate. Pt dizzy/weak on ambulation and had to be assisted back into bed.

## 2023-07-10 NOTE — Assessment & Plan Note (Addendum)
On PPI

## 2023-07-11 DIAGNOSIS — R8290 Unspecified abnormal findings in urine: Secondary | ICD-10-CM | POA: Diagnosis present

## 2023-07-11 DIAGNOSIS — N179 Acute kidney failure, unspecified: Secondary | ICD-10-CM | POA: Diagnosis not present

## 2023-07-11 DIAGNOSIS — N189 Chronic kidney disease, unspecified: Secondary | ICD-10-CM | POA: Diagnosis not present

## 2023-07-11 DIAGNOSIS — E871 Hypo-osmolality and hyponatremia: Secondary | ICD-10-CM | POA: Diagnosis present

## 2023-07-11 DIAGNOSIS — N390 Urinary tract infection, site not specified: Secondary | ICD-10-CM | POA: Diagnosis present

## 2023-07-11 LAB — CBC
HCT: 31.1 % — ABNORMAL LOW (ref 36.0–46.0)
Hemoglobin: 11.2 g/dL — ABNORMAL LOW (ref 12.0–15.0)
MCH: 29.9 pg (ref 26.0–34.0)
MCHC: 36 g/dL (ref 30.0–36.0)
MCV: 83.2 fL (ref 80.0–100.0)
Platelets: 184 10*3/uL (ref 150–400)
RBC: 3.74 MIL/uL — ABNORMAL LOW (ref 3.87–5.11)
RDW: 12.7 % (ref 11.5–15.5)
WBC: 9.5 10*3/uL (ref 4.0–10.5)
nRBC: 0 % (ref 0.0–0.2)

## 2023-07-11 LAB — URINALYSIS, ROUTINE W REFLEX MICROSCOPIC
Bilirubin Urine: NEGATIVE
Glucose, UA: NEGATIVE mg/dL
Ketones, ur: NEGATIVE mg/dL
Nitrite: NEGATIVE
Protein, ur: 30 mg/dL — AB
Specific Gravity, Urine: 1.006 (ref 1.005–1.030)
WBC, UA: >50 WBC/hpf (ref 0–5)
pH: 7 (ref 5.0–8.0)

## 2023-07-11 LAB — GLUCOSE, CAPILLARY
Glucose-Capillary: 130 mg/dL — ABNORMAL HIGH (ref 70–99)
Glucose-Capillary: 138 mg/dL — ABNORMAL HIGH (ref 70–99)

## 2023-07-11 LAB — RENAL FUNCTION PANEL
Albumin: 3.2 g/dL — ABNORMAL LOW (ref 3.5–5.0)
Anion gap: 18 — ABNORMAL HIGH (ref 5–15)
BUN: 68 mg/dL — ABNORMAL HIGH (ref 6–20)
CO2: 29 mmol/L (ref 22–32)
Calcium: 8.1 mg/dL — ABNORMAL LOW (ref 8.9–10.3)
Chloride: 84 mmol/L — ABNORMAL LOW (ref 98–111)
Creatinine, Ser: 6.54 mg/dL — ABNORMAL HIGH (ref 0.44–1.00)
GFR, Estimated: 7 mL/min — ABNORMAL LOW (ref >60)
Glucose, Bld: 119 mg/dL — ABNORMAL HIGH (ref 70–99)
Phosphorus: 2.9 mg/dL (ref 2.5–4.6)
Potassium: 3.2 mmol/L — ABNORMAL LOW (ref 3.5–5.1)
Sodium: 131 mmol/L — ABNORMAL LOW (ref 135–145)

## 2023-07-11 MED ORDER — LACTATED RINGERS IV SOLN: INTRAVENOUS | Status: AC

## 2023-07-11 MED ORDER — SODIUM CHLORIDE 0.9 % IV SOLN
1.0000 g | INTRAVENOUS | Status: DC
  Administered 2023-07-11 – 2023-07-15 (×5): 1 g via INTRAVENOUS
  Filled 2023-07-11 (×5): qty 10
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
## 2023-07-11 NOTE — Progress Notes (Addendum)
Progress Note   Patient: Sarah Lane NGE:[id number] DOB: [birth year] DOA: 07/09/2023     2 DOS: the patient was seen and examined on 07/11/2023   Brief hospital course: HPI on admission: "Sarah Lane is a 57 y.o. female with medical history significant for asthma, HFrEF, type 2 diabetes mellitus, hypertension, PSVT and cardiomyopathy, who presented to the emergency room with acute onset of urinary retention.  The patient is not urinated at all since yesterday.  She stated that she has eaten something but her sugar were still very low.  Her appetite has been diminished though.  She admitted to upper abdominal discomfort without nausea or vomiting or diarrhea or constipation. ..."  See H&P for full HPI on admission & ED course.  Pt was found to have acute renal failure with Cr of 5.71, up from 2.45 recently on 06/23/2023.    Admitted to hospitalist service for further evaluation and management with nephrology consulted.   Assessment and Plan:  * Acute kidney injury superimposed on chronic kidney disease (HCC) Cr 5.85 on admission >> 6.22 >> 6.54 today Suspect ATN.  Renal U/S without obstruction. --Nephrology consulted --Continue IV fluids --Strict I/O's to monitor urine output --Bladder scans PRN --Avoid nephrotoxins & hypotension   Hypoglycemia POA, suspect due to severe AKI. CBG was 45 on admission & had recurrent episodes on hospital day 1. Now improved with CBG's in 80's to 140's --Hypoglycemia protocol --Q4H CBG's --Hold any insulin or anti-diabetic meds  Non-insulin dependent type 2 diabetes mellitus (HCC) See hypoglycemia for mgmt. Hold metformin.  Dyslipidemia On statin  Essential hypertension Hold home Cozaar with renal failure. Continue Toprol-XL  Abnormal urinalysis Minimal urine output since admission. UA turbin with large leukocytes, few bacteria, > 50 WBC's --Send UA sample for culture --Start empiric Rocephin --Follow culture  GERD without  esophagitis On PPI  High anion gap metabolic acidosis Due to acute renal failure. Monitor BMP. On IV fluids        Subjective: Pt seen awake resting in bed. Still feels tired and weak but denies other acute complaints.   Physical Exam: Vitals:   07/10/23 2300 07/10/23 2334 07/11/23 0446 07/11/23 0904  BP: 114/78 117/75 (!) 116/58 (!) 117/59  Pulse: 83 77 71 73  Resp: 19 16 16    Temp:  97.6 F (36.4 C) (!) 97.4 F (36.3 C) 97.6 F (36.4 C)  TempSrc:  Oral Oral   SpO2: 95% 97% 97% 96%  Weight:      Height:       General exam: awake, alert, no acute distress, chronically ill appearing HEENT: moist mucus membranes, hearing grossly normal  Respiratory system: on room air, normal respiratory effort. Cardiovascular system: RRR, no peripheral edema.   Central nervous system: A&O x 3. no gross focal neurologic deficits, normal speech with expressive aphasia Extremities: no edema, normal tone Skin: dry, intact, no rashes seen on visualized skin Psychiatry: normal mood, congruent affect, judgement and insight appear normal   Data Reviewed:  Notable labs ---  Na 133 >> 130 >> 131 Cl 84 Bicarb 18 >> 29, gap 18, glucose 119, BUN 68 Cr increased 6.54 Albumin 3.2   Renal U/S -- no obstruction   Family Communication: none present. Will attempt to call as time allows.    Disposition: Status is: Inpatient Remains inpatient appropriate because: remains on IV fluids with worsening renal failure     Planned Discharge Destination: Home    Time spent: 35 minutes  Author: Pennie Banter, DO  07/11/2023 2:59 PM  For on call review [email or web address].

## 2023-07-11 NOTE — Assessment & Plan Note (Signed)
Minimal urine output since admission. UA turbin with large leukocytes, few bacteria, > 50 WBC's Urine culture grew Klebsiella pneumoniae resistant only to ampicillin --Continue Rocephin to complete course

## 2023-07-11 NOTE — Progress Notes (Signed)
[facility]  ROUNDING NOTE   Subjective:   Sarah Lane  is a 57 y.o. female with a PMHx of diabetes mellitus type 2, seizure disorder, hypertension, dysrhythmia, and history of ovarian cancer.  Patient presents to the emergency department with difficulty urinating and has been admitted for Hypoglycemia [E16.2] Acute renal failure, unspecified acute renal failure type (HCC) [N17.9] Acute kidney injury superimposed on chronic kidney disease (HCC) [N17.9, N18.9]  Patient is known to our practice from previous admission.   Patient resting in bed Alert Appetite remains poor States she is urinating, no record  Creatinine 6.54 IVF Sodium Bicarb 149ml./hr  Objective:  Vital signs in last 24 hours:  Temp:  [97.4 F (36.3 C)-98.1 F (36.7 C)] 97.6 F (36.4 C) (12/01 0904) Pulse Rate:  [71-85] 73 (12/01 0904) Resp:  [8-23] 16 (12/01 0446) BP: (103-132)/(58-78) 117/59 (12/01 0904) SpO2:  [94 %-100 %] 96 % (12/01 0904)  Weight change:  Filed Weights   07/09/23 2059  Weight: 61.2 kg    Intake/Output: I/O last 3 completed shifts: In: 249.8 [IV Piggyback:249.8] Out: -    Intake/Output this shift:  Total I/O In: 2197.7 [I.V.:2197.7] Out: 10 [Urine:10]  Physical Exam: General: NAD  Head: Normocephalic, atraumatic.  Dry oral mucosal membranes  Eyes: Anicteric  Lungs:  Clear to auscultation, normal effort  Heart: Regular rate and rhythm  Abdomen:  Soft, nontender, nondistended  Extremities: No peripheral edema.  Neurologic: Alert and oriented, moving all four extremities  Skin: No lesions  Access: None    Basic Metabolic Panel: Recent Labs  Lab 07/09/23 2115 07/10/23 0415 07/10/23 0910 07/11/23 0857  NA 131* 133* 130* 131*  K 5.2* 3.9 4.1 3.2*  CL 90* 95* 90* 84*  CO2 16* 15* 18* 29  GLUCOSE 53* 82 171* 119*  BUN 72* 73* 73* 68*  CREATININE 5.71* 5.85* 6.22* 6.54*  CALCIUM 9.9 9.0 8.9 8.1*  PHOS  --   --   --  2.9    Liver Function  Tests: Recent Labs  Lab 07/09/23 2115 07/11/23 0857  AST 26  --   ALT 14  --   ALKPHOS 52  --   BILITOT 1.3*  --   PROT 7.8  --   ALBUMIN 4.5 3.2*   Recent Labs  Lab 07/09/23 2115  LIPASE 120*   No results for input(s): "AMMONIA" in the last 168 hours.  CBC: Recent Labs  Lab 07/09/23 2115 07/10/23 0415 07/11/23 0857  WBC 14.8* 10.7* 9.5  NEUTROABS 9.9*  --   --   HGB 13.8 11.5* 11.2*  HCT 41.7 34.3* 31.1*  MCV 90.3 89.3 83.2  PLT 296 202 184    Cardiac Enzymes: No results for input(s): "CKTOTAL", "CKMB", "CKMBINDEX", "TROPONINI" in the last 168 hours.  BNP: Invalid input(s): "POCBNP"  CBG: Recent Labs  Lab 07/10/23 1502 07/10/23 1743 07/10/23 2007 07/10/23 2345 07/11/23 0601  GLUCAP 98 92 110* 149* 130*    Microbiology: Results for orders placed or performed during the hospital encounter of 06/01/23  Urine Culture     Status: Abnormal   Collection Time: 06/01/23  6:05 PM   Specimen: Urine, Random  Result Value Ref Range Status   Specimen Description   Final    URINE, RANDOM Performed at [facility], [address] [place], [place] [address]    Special Requests   Final    NONE Performed at [facility], [address] [place], [place] [address]    Culture  MULTIPLE SPECIES PRESENT, SUGGEST RECOLLECTION (A)  Final   Report Status 06/03/2023 FINAL  Final    Coagulation Studies: No results for input(s): "LABPROT", "INR" in the last 72 hours.  Urinalysis: Recent Labs    07/11/23 0530  COLORURINE YELLOW*  LABSPEC 1.006  PHURINE 7.0  GLUCOSEU NEGATIVE  HGBUR SMALL*  BILIRUBINUR NEGATIVE  KETONESUR NEGATIVE  PROTEINUR 30*  NITRITE NEGATIVE  LEUKOCYTESUR LARGE*      Imaging: US RENAL  Result Date: 07/10/2023 CLINICAL DATA:  Acute kidney injury EXAM: RENAL / URINARY TRACT ULTRASOUND COMPLETE COMPARISON:  CT 07/09/2023 FINDINGS: Right Kidney: Renal measurements: 11.1 x 5.6 x 5.6 cm = volume: 180 mL. Echogenicity  within normal limits. Simple 2.1 cm cyst at the inferior pole of the right kidney. No solid mass, shadowing stone, or hydronephrosis visualized. Left Kidney: Renal measurements: 12.3 x 6.3 x 5.4 cm = volume: 217 mL. Echogenicity within normal limits. Simple 2.4 cm cyst at the interpolar region of the left kidney. No solid mass, shadowing stone, or hydronephrosis visualized. Bladder: Urinary bladder wall appears thickened, which may be accentuated by under-distension. Other: None. IMPRESSION: 1. No hydronephrosis. 2. Bilateral simple renal cysts which do not require follow-up imaging. 3. Urinary bladder wall appears thickened, which may be accentuated by under-distension. Correlate with urinalysis to exclude cystitis. Electronically Signed   By: Duanne Guess D.O.   On: 07/10/2023 11:53   CT ABDOMEN PELVIS WO CONTRAST  Result Date: 07/09/2023 CLINICAL DATA:  Status post cholecystectomy 3 weeks ago, presenting with abdominal tenderness on physical exam. EXAM: CT ABDOMEN AND PELVIS WITHOUT CONTRAST TECHNIQUE: Multidetector CT imaging of the abdomen and pelvis was performed following the standard protocol without IV contrast. RADIATION DOSE REDUCTION: This exam was performed according to the departmental dose-optimization program which includes automated exposure control, adjustment of the mA and/or kV according to patient size and/or use of iterative reconstruction technique. COMPARISON:  June 01, 2023 FINDINGS: Lower chest: A stable, benign 15 mm noncalcified lung nodule is seen within the posterior aspect of the right lung base. An additional stable 3 mm posterolateral right basilar lung nodule is noted (axial CT image 12, CT series 4). Hepatobiliary: No focal liver abnormality is seen. Status post cholecystectomy. No biliary dilatation. Pancreas: Unremarkable. No pancreatic ductal dilatation or surrounding inflammatory changes. Spleen: Normal in size without focal abnormality. Adrenals/Urinary Tract:  Adrenal glands are unremarkable. Kidneys are normal in size, without renal calculi or hydronephrosis. A 1.7 cm diameter cyst is seen within the posterolateral aspect of the mid to lower right kidney. An additional 2.0 cm diameter cyst is seen within the anterior aspect of the mid left kidney. The urinary bladder is poorly distended and subsequently limited in evaluation. Stomach/Bowel: Stomach is within normal limits. Appendix appears normal. No evidence of bowel wall thickening, distention, or inflammatory changes. Vascular/Lymphatic: Aortic atherosclerosis. No enlarged abdominal or pelvic lymph nodes. Reproductive: Status post hysterectomy. No adnexal masses. Other: A 2.1 cm x 1.8 cm x 3.0 cm fat containing ventral defect is seen at the level of the umbilicus. No associated fluid collection or inflammatory stranding is seen. No abdominopelvic ascites. Musculoskeletal: Chronic compression deformities are seen involving the T12 and L1 vertebral bodies. No acute osseous abnormalities are identified. IMPRESSION: 1. Evidence of prior cholecystectomy. 2. Stable bilateral renal cysts. No follow-up imaging is recommended. This recommendation follows ACR consensus guidelines: Management of the Incidental Renal Mass on CT: A White Paper of the ACR Incidental Findings Committee. J Am Coll Radiol [phone]. 3. Fat-containing ventral  hernia. 4. Chronic compression deformities involving the T12 and L1 vertebral bodies. 5. Aortic atherosclerosis. Aortic Atherosclerosis (ICD10-I70.0). Electronically Signed   By: Aram Candela M.D.   On: 07/09/2023 22:25     Medications:    cefTRIAXone (ROCEPHIN)  IV 1 g (07/11/23 1021)   sodium bicarbonate 150 mEq in dextrose 5 % 1,150 mL infusion Stopped (07/10/23 2321)    aspirin  81 mg Oral Daily   atorvastatin  80 mg Oral Daily   heparin injection (subcutaneous)  5,000 Units Subcutaneous Q12H   metoprolol succinate  12.5 mg Oral Daily   pantoprazole  40 mg Oral Daily    sodium chloride flush  3 mL Intravenous Q12H   acetaminophen **OR** acetaminophen, magnesium hydroxide, ondansetron **OR** ondansetron (ZOFRAN) IV, sodium chloride flush, traZODone  Assessment/ Plan:  Sarah Lane is a 57 y.o.  female  is a 57 y.o. female with a PMHx of diabetes mellitus type 2, seizure disorder, hypertension, dysrhythmia, and history of ovarian cancer.  Patient presents to the emergency department complaining of difficulty with urination.  Patient has been admitted for Hypoglycemia [E16.2] Acute renal failure, unspecified acute renal failure type (HCC) [N17.9] Acute kidney injury superimposed on chronic kidney disease (HCC) [N17.9, N18.9]   Acute Kidney Injury on chronic kidney disease stage IIIa with baseline creatinine 1.27 and GFR of 49 on 04/07/23.  Acute kidney injury resulting in ATN secondary to volume depletion and continued ACE/ARB use. CT abdomen pelvis showed bilateral nonobstructing cyst.  Renal ultrasound confirms no obstruction and presence of simple renal cyst.  Also questionable for cystitis.   Creatinine remains elevated. No urine output noted. Encourage nursing staff to document intake and output. Nursing states patient voided 10ml, postvoid bladder scan . No acute indication for dialysis but monitoring closely. Continue to avoid nephrotoxic agents and therapies, including hypotension.   Lab Results  Component Value Date   CREATININE 6.54 (H) 07/11/2023   CREATININE 6.22 (H) 07/10/2023   CREATININE 5.85 (H) 07/10/2023    Intake/Output Summary (Last 24 hours) at 07/11/2023 1302 Last data filed at 07/11/2023 1100 Gross per 24 hour  Intake 2197.65 ml  Output 10 ml  Net 2187.65 ml   2.  Hyponatremia/hyperkalemia.  Potassium 5.2 on admission, corrected to 4.1 after shifting measures.  Sodium 131 on admission.  Likely due to poor oral intake in combination with kidney injury.    Potassium 3.2 today with sodium 131. Continue current treatments. Will  order lactated ringers at 1ml/hr.   3.  Acute metabolic acidosis.  Serum bicarb 16 on admission. Corrected with IVF  4. Anemia of chronic kidney disease Lab Results  Component Value Date   HGB 11.2 (L) 07/11/2023    Hemoglobin at goal.  Will continue to monitor for now.  5. Diabetes mellitus type II with chronic kidney disease: noninsulin dependent. Home regimen includes metformin. Most recent hemoglobin A1c is 8.2 on 03/31/23.     LOS: 2 Coila Wardell 12/1/20241:02 PM

## 2023-07-11 NOTE — Discharge Instructions (Addendum)
Transportation Resources  Agency Name: [facility] Agency Address: [address] [place], [place] [address] Phone: [phone] Email: [email or web address] Website: [email or web address] Service(s) Offered: Housing services, self-sufficiency, congregate meal program, weatherization program, Field seismologist program, emergency food assistance,  housing counseling, home ownership program, wheels-towork program.  Agency Name: [facility] [employer] [phone]) Address: [address], [place], [place] [address] Phone: [phone] Website: [email or web address].acta-[email or web address] Service(s) Offered: Transportation for [employer], subscription and demand response; Dial-a-Ride for citizens 39 years of age or older.  Agency Name: [facility] Social Services Address: [address] [place], [place] [address] Phone: [phone] Service(s) Offered: Child support services; child welfare services; food stamps; Medicaid; work first family assistance; and aid with fuel,  rent, food and medicine, transportation assistance.  Agency Name: Disabled [employer] (DAV) Transportation  Network Phone: [phone] Service(s) Offered: Transports veterans to the [facility] medical center. Call  forty-eight hours in advance and leave the name, telephone  number, date, and time of appointment. Veteran will be  contacted by the driver the day before the appointment to  arrange a pick up point   Transportation Resources  Agency Name: [facility] Agency Address: [address] [place], [place] [address] Phone: [phone] Email: [email or web address] Website: [email or web address] Service(s) Offered: Housing services, self-sufficiency, congregate meal program, weatherization program, Field seismologist program, emergency food assistance,  housing counseling, home ownership program, wheels-towork  program.  Agency Name: [facility] [employer] [phone]) Address: [address] [place], [place] [address] Phone: [phone] Website: www.acta-.com Service(s) Offered: Transportation for [employer], subscription and demand response; Dial-a-Ride for citizens 93 years of age or older.  Agency Name: Department of Social Services Address: [address] [place], [place] [address] Phone: [phone] Service(s) Offered: Child support services; child welfare services; food stamps; Medicaid; work first family assistance; and aid with fuel,  rent, food and medicine, transportation assistance.  Agency Name: Disabled [employer] (DAV) Transportation  Network Phone: [phone] Service(s) Offered: Transports veterans to the [facility] medical center. Call  forty-eight hours in advance and leave the name, telephone  number, date, and time of appointment. Veteran will be  contacted by the driver the day before the appointment to  arrange a pick up point    [employer] ACTA currently provides door to door services. ACTA connects with PART daily for services to [facility]. ACTA also performs contract services to [employer] operates 27 vehicles, all but 3 mini-vans are equipped with lifts for special needs as well as the general public. ACTA drivers are each CDL certified and trained in First Aid and CPR. ACTA was established in 2002 by [employer]. An independent Industrial/product designer. ACTA operates via Cytogeneticist with required Research scientist (physical sciences) from [place]. ACTA provides over 80,000 passenger trips each year, including [facility] and [employer] sites.  Call at least by 11 AM one business day prior to needing transportation  [employer].                      [place], [place] [address]     Office  Hours: Monday-Friday  8 AM - 5 PM     Your dialysis transport through [facility]): [phone] - that we arranged at bedside Tuesday: Reservation # [id number] Thursday: Reservation # [id number] Saturday: Reservation # [id number]      Rent/Utility/Housing  Agency Name: [facility] Agency Address: [address] [place], [place] [address] Phone: [phone] Email: [email or web address] Website: [email or web address] Service(s) Offered: Housing services, self-sufficiency, congregate meal program, weatherization program, Field seismologist program, emergency food assistance,  housing counseling, home ownership program, wheels -towork program.  Agency Name: [facility] Address: [address] [place], [place] [address] Phone: [phone] (8a-4p) [phone] (8p- 10p) Email: [email or web address] Website: [email or web address] Service(s) Offered: A program for homeless and/or needy men that includes one-on-one counseling, life skills training and job rehabilitation.  Agency Name: [facility] Address: [address] [place], [place] [address] Phone: [phone] Website: www.[email or web address].org Service(s) Offered: Assistance to needy in emergency with utility bills, heating fuel, and prescriptions. Shelter for homeless 7pm-7am. December 03, 2016 15  Agency Name: [facility] [place] [facility]Developmentally Disabled) Address: [address], [place], [place] [address] Phone: [phone] Contact Person: Cathleen Corti Email: [email or web address] Website: [email or web address] Service(s) Offered: Helps individuals with developmental disabilities move from housing that is more restrictive to homes where they  can achieve greater independence and have more  opportunities.  Agency Name: [facility] Address: [address], [place], [place] [address] Phone: [phone] Email:  [email or web address] Website: www.[email or web address].org Service(s) Offered: Provides affordable housing for low-income families, elderly, and disabled individuals. Offer a wide range of  programs and services, from financial planning to afterschool and summer programs.  Agency Name: Department of Social Services Address: [address] [place], [place] [address] Phone: [phone] Service(s) Offered: Child support services; child welfare services; food stamps; Medicaid; work first family assistance; and aid with fuel,  rent, food and medicine.  Agency Name: [facility] [place] [employer]. Address: [facility] [address] [place], [place]  [address] Phone: [phone] Website: www.familyabuseservices.org Service(s) Offered: 24 hour Crisis Line: [phone]; 24 hour Emergency Shelter; Transitional Housing; Support Groups; Scientist, physiological; [employer]; Hispanic Outreach: [phone];  Visitation Center: [phone].  Agency Name: [facility] [place]. Address: [address] [place], [place] [address] Phone: [phone] Service(s) Offered: CAP Services; Home and [employer]; Individual or Group Supports; Respite Care Non-Institutional Nursing;  Residential Supports; Respite Care and Personal Care Services; Transportation; Family and Friends Night; Recreational Activities; Three Nutritious Meals/Snacks; Consultation with Registered Dietician; Twenty-four hour Registered Nurse Access; Daily and [employer]; Camp [employer]aves; Reading for the [employer] (During Summer Months) Bingo Night (Every  Wednesday Night); Special Populations Dance Night  (Every Tuesday Night); Professional Hair Care Services.  Agency Name: [facility] Address: [address], [place], [place] [address] Phone: [phone] Contact Person: Jabier Mutton Website: [email or web address].[email or web address], medical)  Offered: Residential treatment facility for women; food and  clothing, educational & employment development and  transportation to work; Counsellor of financial skills;  parenting and family reunification; emotional and spiritual  support; transitional housing for program graduates.  Agency Name: [facility] Address: [address], [place], [place] [address] Phone: [phone] Email: [email or web address] Website: [email or web address].es Service(s) Offered: Public housing units for elderly, disabled, and low income people; housing choice vouchers for income eligible  applicants; shelter plus care vouchers; and Psychologist, clinical.  Agency Name: [facility] [employer] Address: [address] [place], [place] [address] Phone: [phone] Email: [email or web address] Website: www.habitatalamance.org Service(s) Offered: Build houses for families in need of decent housing. Each adult in the family must invest 200 hours of labor on  someone else's house, work with volunteers to build their own house, attend classes on budgeting, home maintenance, yard care, and  attend homeowner association meetings.  Agency Name: [facility] Address: [address], [place], [place] [address] Phone: [phone] Website: www.rsli.org Service(s) Offered: Intermediate care facilities for intellectually delayed, Supervised Living in group homes for adults with developmental disabilities, Supervised Living for people who have dual diagnoses (MRMI), Independent Living, Supported Living, respite and a variety of CAP services, pre-vocational services, day supports, and [employer].  Agency Name: [place] [employer] Phone: [phone] Website: www.NCForeclosurePrevention.gov Service(s) Offered: Zero-interest, deferred loans to homeowners struggling to pay their mortgage. Call for more information.

## 2023-07-11 NOTE — Plan of Care (Signed)
  Problem: Safety: Goal: Ability to remain free from injury will improve Outcome: Progressing   Problem: Elimination: Goal: Will not experience complications related to bowel motility Outcome: Progressing Goal: Will not experience complications related to urinary retention Outcome: Progressing

## 2023-07-12 DIAGNOSIS — Z452 Encounter for adjustment and management of vascular access device: Secondary | ICD-10-CM

## 2023-07-12 DIAGNOSIS — Z7982 Long term (current) use of aspirin: Secondary | ICD-10-CM

## 2023-07-12 DIAGNOSIS — E1122 Type 2 diabetes mellitus with diabetic chronic kidney disease: Secondary | ICD-10-CM

## 2023-07-12 DIAGNOSIS — Z79899 Other long term (current) drug therapy: Secondary | ICD-10-CM

## 2023-07-12 DIAGNOSIS — N189 Chronic kidney disease, unspecified: Secondary | ICD-10-CM | POA: Diagnosis not present

## 2023-07-12 DIAGNOSIS — N179 Acute kidney failure, unspecified: Secondary | ICD-10-CM | POA: Diagnosis not present

## 2023-07-12 DIAGNOSIS — Z7984 Long term (current) use of oral hypoglycemic drugs: Secondary | ICD-10-CM

## 2023-07-12 DIAGNOSIS — E11649 Type 2 diabetes mellitus with hypoglycemia without coma: Secondary | ICD-10-CM | POA: Diagnosis not present

## 2023-07-12 LAB — HEPATITIS B SURFACE ANTIGEN: Hepatitis B Surface Ag: NONREACTIVE

## 2023-07-12 LAB — RENAL FUNCTION PANEL
Albumin: 3.1 g/dL — ABNORMAL LOW (ref 3.5–5.0)
Anion gap: 15 (ref 5–15)
BUN: 66 mg/dL — ABNORMAL HIGH (ref 6–20)
CO2: 31 mmol/L (ref 22–32)
Calcium: 7.8 mg/dL — ABNORMAL LOW (ref 8.9–10.3)
Chloride: 86 mmol/L — ABNORMAL LOW (ref 98–111)
Creatinine, Ser: 6.58 mg/dL — ABNORMAL HIGH (ref 0.44–1.00)
GFR, Estimated: 7 mL/min — ABNORMAL LOW (ref >60)
Glucose, Bld: 140 mg/dL — ABNORMAL HIGH (ref 70–99)
Phosphorus: 2.6 mg/dL (ref 2.5–4.6)
Potassium: 3 mmol/L — ABNORMAL LOW (ref 3.5–5.1)
Sodium: 132 mmol/L — ABNORMAL LOW (ref 135–145)

## 2023-07-12 LAB — GLUCOSE, CAPILLARY
Glucose-Capillary: 89 mg/dL (ref 70–99)
Glucose-Capillary: 89 mg/dL (ref 70–99)
Glucose-Capillary: 120 mg/dL — ABNORMAL HIGH (ref 70–99)
Glucose-Capillary: 130 mg/dL — ABNORMAL HIGH (ref 70–99)
Glucose-Capillary: 128 mg/dL — ABNORMAL HIGH (ref 70–99)
Glucose-Capillary: 135 mg/dL — ABNORMAL HIGH (ref 70–99)

## 2023-07-12 LAB — HEPATITIS B CORE ANTIBODY, TOTAL: Hep B Core Total Ab: NONREACTIVE

## 2023-07-12 MED ORDER — POTASSIUM CHLORIDE CRYS ER 20 MEQ PO TBCR
40.0000 meq | EXTENDED_RELEASE_TABLET | ORAL | Status: AC
  Administered 2023-07-12 (×2): 40 meq via ORAL
  Filled 2023-07-12 (×2): qty 2

## 2023-07-12 NOTE — Consult Note (Signed)
Hospital Consult    Reason for Consult:  Perma Catheter Insertion AKI on CKD Requesting Physician:  Malachi Carl NP  MRN #:  [id number]  History of Present Illness: This is a 57 y.o. female  with a PMHx of diabetes mellitus type 2, seizure disorder, hypertension, dysrhythmia, and history of ovarian cancer.  Patient presents to the emergency department with difficulty urinating and has been admitted for Hypoglycemia [E16.2] Acute renal failure, unspecified acute renal failure type with Acute kidney injury superimposed on chronic kidney disease. Vascular Surgery Consulted for hemodialysis access.   Past Medical History:  Diagnosis Date   Aortic atherosclerosis (HCC)    a. 03/2023 noted on CT.   Asthma    childhood asthma   Cancer (HCC)    ovarian cancer   Cardiomyopathy (HCC)    a. 01/2023 Echo [facility]): EF 15-20%, impaired relaxation, mildly to mod dil RV w/ mild RV dysfxn, mod PH, BAE; b. 03/2023 Echo: EF 30-35%, glob HK, GrI DD, mod reduced RV fxn, mod enlarged RV, RVSP 42.31mmHg, mild MR, mild-mod TR.   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 Dx 01/2023-->[facility] Echo: EF 15-20%; b. 03/2023 Echo: EF 30-35%.   Diabetes (HCC)    Diabetes mellitus without complication (HCC)    Expressive aphasia    a. 01/2023 L MCA occlusive stroke s/p thrombectomy.   Family history of adverse reaction to anesthesia    Grand father had facial swelling   H/O ischemic left MCA stroke    a. 01/2023 s/p admission @ [facility]. CTA Head w/ M1 occlusion of L MCA, possible high-grade stenosis of distal A3 segment. S/p thrombectomy.   Hypertension    PSVT (paroxysmal supraventricular tachycardia) (HCC)    Right lower lobe pulmonary nodule    a. 03/2023 CT Chest - 14mm - stable.   Seizures (HCC)    petit mals at age 26 treated with medications. Last one age 4    Past Surgical History:  Procedure Laterality Date   ABDOMINAL HYSTERECTOMY   2011   ovarian cancer   IRRIGATION AND DEBRIDEMENT ABDOMEN N/A 12/31/2014   Procedure: IRRIGATION AND DEBRIDEMENT ABDOMINAL WALL ABSCESS;  Surgeon: Luretha Murphy, MD;  Location: [facility];  Service: General;  Laterality: N/A;   LYSIS OF ADHESION  06/23/2023   Procedure: LYSIS OF ADHESION;  Surgeon: Campbell Lerner, MD;  Location: [facility] ORS;  Service: General;;   RIGHT/LEFT HEART CATH AND CORONARY ANGIOGRAPHY N/A 04/06/2023   Procedure: RIGHT/LEFT HEART CATH AND CORONARY ANGIOGRAPHY;  Surgeon: Yvonne Kendall, MD;  Location: [facility] INVASIVE CV LAB;  Service: Cardiovascular;  Laterality: N/A;    Allergies  Allergen Reactions   Jardiance [Empagliflozin]     Multiple side effect complications including: DKA, pancreatitis, renal failure. Avoid re-prescribing   Codeine Nausea And Vomiting   Ibuprofen Nausea And Vomiting    Prior to Admission medications   Medication Sig Start Date End Date Taking? Authorizing Provider  aspirin 81 MG chewable tablet Chew 81 mg by mouth in the morning.   Yes [provider]  atorvastatin (LIPITOR) 80 MG tablet Take 80 mg by mouth in the morning.   Yes [provider]  furosemide (LASIX) 40 MG tablet Take 1 tablet (40 mg total) by mouth daily. Patient taking differently: Take 40 mg by mouth daily as needed (fluid retention/weight gain 3 lbs or more.). 04/19/23  Yes Gollan, Tollie Pizza, MD  losartan (COZAAR) 25 MG tablet Take 1 tablet (  25 mg total) by mouth daily. Patient taking differently: Take 12.5 mg by mouth in the morning. 04/19/23  Yes Antonieta Iba, MD  metFORMIN (GLUCOPHAGE) 1000 MG tablet Take 1,000 mg by mouth 2 (two) times daily with a meal.   Yes [provider]  metoprolol succinate (TOPROL-XL) 25 MG 24 hr tablet Take 12.5 mg by mouth in the morning.   Yes [provider]  omeprazole (PRILOSEC) 40 MG capsule Take 1 capsule (40 mg total) by mouth daily. 06/04/23 07/09/23 Yes Lucile Shutters, MD    Social History    Socioeconomic History   Marital status: Widowed    Spouse name: Not on file   Number of children: Not on file   Years of education: Not on file   Highest education level: Not on file  Occupational History   Not on file  Tobacco Use   Smoking status: Never    Passive exposure: Never   Smokeless tobacco: Not on file  Vaping Use   Vaping status: Never Used  Substance and Sexual Activity   Alcohol use: No   Drug use: No   Sexual activity: Never  Other Topics Concern   Not on file  Social History Narrative   Widowed.  Lives in [place] w/ roommate.  Does not routinely exercise.   Social Determinants of Health   Financial Resource Strain: Medium Risk (05/17/2023)   Received from [facility]   Overall Financial Resource Strain (CARDIA)    Difficulty of Paying Living Expenses: Somewhat hard  Food Insecurity: No Food Insecurity (07/10/2023)   Hunger Vital Sign    Worried About Running Out of Food in the Last Year: Never true    Ran Out of Food in the Last Year: Never true  Transportation Needs: Unmet Transportation Needs (07/10/2023)   PRAPARE - Administrator, Civil Service (Medical): Yes    Lack of Transportation (Non-Medical): Yes  Physical Activity: Insufficiently Active (05/17/2023)   Received from [facility]   Exercise Vital Sign    Days of Exercise per Week: 2 days    Minutes of Exercise per Session: 60 min  Stress: No Stress Concern Present (05/18/2023)   Received from [facility] of Occupational Health - Occupational Stress Questionnaire    Feeling of Stress : Not at all  Social Connections: Socially Isolated (05/18/2023)   Received from [facility]   Social Connection and Isolation Panel [NHANES]    Frequency of Communication with Friends and Family: Once a week    Frequency of Social Gatherings with Friends and Family: Never    Attends Religious Services: Never    Database administrator or Organizations: No     Attends Banker Meetings: Never    Marital Status: Widowed  Intimate Partner Violence: Not At Risk (07/10/2023)   Humiliation, Afraid, Rape, and Kick questionnaire    Fear of Current or Ex-Partner: No    Emotionally Abused: No    Physically Abused: No    Sexually Abused: No     Family History  Problem Relation Age of Onset   CAD Father        s/p CABG x 2 and stenting   Parkinson's disease Father     ROS: Otherwise negative unless mentioned in HPI  Physical Examination  Vitals:   07/12/23 0837 07/12/23 0839  BP: (!) 127/105 130/83  Pulse: 72 71  Resp: 18   Temp: (!) 97.4 F (  36.3 C)   SpO2: 96% 97%   Body mass index is 26.37 kg/m.  General:  WDWN in NAD Gait: Not observed HENT: WNL, normocephalic Pulmonary: normal non-labored breathing, without Rales, rhonchi,  wheezing Cardiac: regular, without  Murmurs, rubs or gallops; without carotid bruits Abdomen: Positive bowel Sounds, soft, NT/ND, no masses Skin: without rashes Vascular Exam/Pulses: Palpable throughout Extremities: without ischemic changes, without Gangrene , without cellulitis; without open wounds;  Musculoskeletal: no muscle wasting or atrophy  Neurologic: A&O X 3;  No focal weakness or paresthesias are detected; speech is fluent/normal Psychiatric:  The pt has Normal affect. Lymph:  Unremarkable  CBC    Component Value Date/Time   WBC 9.5 07/11/2023 0857   RBC 3.74 (L) 07/11/2023 0857   HGB 11.2 (L) 07/11/2023 0857   HCT 31.1 (L) 07/11/2023 0857   PLT 184 07/11/2023 0857   MCV 83.2 07/11/2023 0857   MCH 29.9 07/11/2023 0857   MCHC 36.0 07/11/2023 0857   RDW 12.7 07/11/2023 0857   LYMPHSABS 3.5 07/09/2023 2115   MONOABS 0.9 07/09/2023 2115   EOSABS 0.3 07/09/2023 2115   BASOSABS 0.1 07/09/2023 2115    BMET    Component Value Date/Time   NA 132 (L) 07/12/2023 0217   K 3.0 (L) 07/12/2023 0217   CL 86 (L) 07/12/2023 0217   CO2 31 07/12/2023 0217   GLUCOSE 140 (H) 07/12/2023  0217   BUN 66 (H) 07/12/2023 0217   CREATININE 6.58 (H) 07/12/2023 0217   CALCIUM 7.8 (L) 07/12/2023 0217   GFRNONAA 7 (L) 07/12/2023 0217   GFRAA >60 01/02/2015 0434    COAGS: No results found for: "INR", "PROTIME"   Non-Invasive Vascular Imaging:   None  Statin:  Yes.   Beta Blocker:  Yes.   Aspirin:  Yes.   ACEI:  No. ARB:  No. CCB use:  No Other antiplatelets/anticoagulants:  No.    ASSESSMENT/PLAN: This is a 57 y.o. female Patient presents to the emergency department with difficulty urinating and has been admitted for Hypoglycemia with Acute renal failure, Acute kidney injury superimposed on chronic kidney disease.  Vascular surgery plans on taking the patient to the vascular lab tomorrow for hemodialysis access with a permacatheter placement.  I discussed in detail with the patient today the procedure, benefits, risk, and complications.  She verbalizes her understanding and wishes to proceed.  I answered all of patient's questions today.  Patient will be made n.p.o. after midnight for procedure tomorrow.   -I discussed the plan in detail with Dr. [name] MD and he agrees with the plan.   Marcie Bal [facility] 07/12/2023 11:52 AM

## 2023-07-12 NOTE — H&P (View-Only) (Signed)
Hospital Consult    Reason for Consult:  Perma Catheter Insertion AKI on CKD Requesting Physician:  Malachi Carl NP  MRN #:  [id number]  History of Present Illness: This is a 57 y.o. female  with a PMHx of diabetes mellitus type 2, seizure disorder, hypertension, dysrhythmia, and history of ovarian cancer.  Patient presents to the emergency department with difficulty urinating and has been admitted for Hypoglycemia [E16.2] Acute renal failure, unspecified acute renal failure type with Acute kidney injury superimposed on chronic kidney disease. Vascular Surgery Consulted for hemodialysis access.   Past Medical History:  Diagnosis Date   Aortic atherosclerosis (HCC)    a. 03/2023 noted on CT.   Asthma    childhood asthma   Cancer (HCC)    ovarian cancer   Cardiomyopathy (HCC)    a. 01/2023 Echo [facility]): EF 15-20%, impaired relaxation, mildly to mod dil RV w/ mild RV dysfxn, mod PH, BAE; b. 03/2023 Echo: EF 30-35%, glob HK, GrI DD, mod reduced RV fxn, mod enlarged RV, RVSP 42.31mmHg, mild MR, mild-mod TR.   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 Dx 01/2023-->[facility] Echo: EF 15-20%; b. 03/2023 Echo: EF 30-35%.   Diabetes (HCC)    Diabetes mellitus without complication (HCC)    Expressive aphasia    a. 01/2023 L MCA occlusive stroke s/p thrombectomy.   Family history of adverse reaction to anesthesia    Grand father had facial swelling   H/O ischemic left MCA stroke    a. 01/2023 s/p admission @ [facility]. CTA Head w/ M1 occlusion of L MCA, possible high-grade stenosis of distal A3 segment. S/p thrombectomy.   Hypertension    PSVT (paroxysmal supraventricular tachycardia) (HCC)    Right lower lobe pulmonary nodule    a. 03/2023 CT Chest - 14mm - stable.   Seizures (HCC)    petit mals at age 26 treated with medications. Last one age 4    Past Surgical History:  Procedure Laterality Date   ABDOMINAL HYSTERECTOMY   2011   ovarian cancer   IRRIGATION AND DEBRIDEMENT ABDOMEN N/A 12/31/2014   Procedure: IRRIGATION AND DEBRIDEMENT ABDOMINAL WALL ABSCESS;  Surgeon: Luretha Murphy, MD;  Location: [facility];  Service: General;  Laterality: N/A;   LYSIS OF ADHESION  06/23/2023   Procedure: LYSIS OF ADHESION;  Surgeon: Campbell Lerner, MD;  Location: [facility] ORS;  Service: General;;   RIGHT/LEFT HEART CATH AND CORONARY ANGIOGRAPHY N/A 04/06/2023   Procedure: RIGHT/LEFT HEART CATH AND CORONARY ANGIOGRAPHY;  Surgeon: Yvonne Kendall, MD;  Location: [facility] INVASIVE CV LAB;  Service: Cardiovascular;  Laterality: N/A;    Allergies  Allergen Reactions   Jardiance [Empagliflozin]     Multiple side effect complications including: DKA, pancreatitis, renal failure. Avoid re-prescribing   Codeine Nausea And Vomiting   Ibuprofen Nausea And Vomiting    Prior to Admission medications   Medication Sig Start Date End Date Taking? Authorizing Provider  aspirin 81 MG chewable tablet Chew 81 mg by mouth in the morning.   Yes [provider]  atorvastatin (LIPITOR) 80 MG tablet Take 80 mg by mouth in the morning.   Yes [provider]  furosemide (LASIX) 40 MG tablet Take 1 tablet (40 mg total) by mouth daily. Patient taking differently: Take 40 mg by mouth daily as needed (fluid retention/weight gain 3 lbs or more.). 04/19/23  Yes Gollan, Tollie Pizza, MD  losartan (COZAAR) 25 MG tablet Take 1 tablet (  25 mg total) by mouth daily. Patient taking differently: Take 12.5 mg by mouth in the morning. 04/19/23  Yes Antonieta Iba, MD  metFORMIN (GLUCOPHAGE) 1000 MG tablet Take 1,000 mg by mouth 2 (two) times daily with a meal.   Yes [provider]  metoprolol succinate (TOPROL-XL) 25 MG 24 hr tablet Take 12.5 mg by mouth in the morning.   Yes [provider]  omeprazole (PRILOSEC) 40 MG capsule Take 1 capsule (40 mg total) by mouth daily. 06/04/23 07/09/23 Yes Lucile Shutters, MD    Social History    Socioeconomic History   Marital status: Widowed    Spouse name: Not on file   Number of children: Not on file   Years of education: Not on file   Highest education level: Not on file  Occupational History   Not on file  Tobacco Use   Smoking status: Never    Passive exposure: Never   Smokeless tobacco: Not on file  Vaping Use   Vaping status: Never Used  Substance and Sexual Activity   Alcohol use: No   Drug use: No   Sexual activity: Never  Other Topics Concern   Not on file  Social History Narrative   Widowed.  Lives in [place] w/ roommate.  Does not routinely exercise.   Social Determinants of Health   Financial Resource Strain: Medium Risk (05/17/2023)   Received from [facility]   Overall Financial Resource Strain (CARDIA)    Difficulty of Paying Living Expenses: Somewhat hard  Food Insecurity: No Food Insecurity (07/10/2023)   Hunger Vital Sign    Worried About Running Out of Food in the Last Year: Never true    Ran Out of Food in the Last Year: Never true  Transportation Needs: Unmet Transportation Needs (07/10/2023)   PRAPARE - Administrator, Civil Service (Medical): Yes    Lack of Transportation (Non-Medical): Yes  Physical Activity: Insufficiently Active (05/17/2023)   Received from [facility]   Exercise Vital Sign    Days of Exercise per Week: 2 days    Minutes of Exercise per Session: 60 min  Stress: No Stress Concern Present (05/18/2023)   Received from [facility] of Occupational Health - Occupational Stress Questionnaire    Feeling of Stress : Not at all  Social Connections: Socially Isolated (05/18/2023)   Received from [facility]   Social Connection and Isolation Panel [NHANES]    Frequency of Communication with Friends and Family: Once a week    Frequency of Social Gatherings with Friends and Family: Never    Attends Religious Services: Never    Database administrator or Organizations: No     Attends Banker Meetings: Never    Marital Status: Widowed  Intimate Partner Violence: Not At Risk (07/10/2023)   Humiliation, Afraid, Rape, and Kick questionnaire    Fear of Current or Ex-Partner: No    Emotionally Abused: No    Physically Abused: No    Sexually Abused: No     Family History  Problem Relation Age of Onset   CAD Father        s/p CABG x 2 and stenting   Parkinson's disease Father     ROS: Otherwise negative unless mentioned in HPI  Physical Examination  Vitals:   07/12/23 0837 07/12/23 0839  BP: (!) 127/105 130/83  Pulse: 72 71  Resp: 18   Temp: (!) 97.4 F (  36.3 C)   SpO2: 96% 97%   Body mass index is 26.37 kg/m.  General:  WDWN in NAD Gait: Not observed HENT: WNL, normocephalic Pulmonary: normal non-labored breathing, without Rales, rhonchi,  wheezing Cardiac: regular, without  Murmurs, rubs or gallops; without carotid bruits Abdomen: Positive bowel Sounds, soft, NT/ND, no masses Skin: without rashes Vascular Exam/Pulses: Palpable throughout Extremities: without ischemic changes, without Gangrene , without cellulitis; without open wounds;  Musculoskeletal: no muscle wasting or atrophy  Neurologic: A&O X 3;  No focal weakness or paresthesias are detected; speech is fluent/normal Psychiatric:  The pt has Normal affect. Lymph:  Unremarkable  CBC    Component Value Date/Time   WBC 9.5 07/11/2023 0857   RBC 3.74 (L) 07/11/2023 0857   HGB 11.2 (L) 07/11/2023 0857   HCT 31.1 (L) 07/11/2023 0857   PLT 184 07/11/2023 0857   MCV 83.2 07/11/2023 0857   MCH 29.9 07/11/2023 0857   MCHC 36.0 07/11/2023 0857   RDW 12.7 07/11/2023 0857   LYMPHSABS 3.5 07/09/2023 2115   MONOABS 0.9 07/09/2023 2115   EOSABS 0.3 07/09/2023 2115   BASOSABS 0.1 07/09/2023 2115    BMET    Component Value Date/Time   NA 132 (L) 07/12/2023 0217   K 3.0 (L) 07/12/2023 0217   CL 86 (L) 07/12/2023 0217   CO2 31 07/12/2023 0217   GLUCOSE 140 (H) 07/12/2023  0217   BUN 66 (H) 07/12/2023 0217   CREATININE 6.58 (H) 07/12/2023 0217   CALCIUM 7.8 (L) 07/12/2023 0217   GFRNONAA 7 (L) 07/12/2023 0217   GFRAA >60 01/02/2015 0434    COAGS: No results found for: "INR", "PROTIME"   Non-Invasive Vascular Imaging:   None  Statin:  Yes.   Beta Blocker:  Yes.   Aspirin:  Yes.   ACEI:  No. ARB:  No. CCB use:  No Other antiplatelets/anticoagulants:  No.    ASSESSMENT/PLAN: This is a 57 y.o. female Patient presents to the emergency department with difficulty urinating and has been admitted for Hypoglycemia with Acute renal failure, Acute kidney injury superimposed on chronic kidney disease.  Vascular surgery plans on taking the patient to the vascular lab tomorrow for hemodialysis access with a permacatheter placement.  I discussed in detail with the patient today the procedure, benefits, risk, and complications.  She verbalizes her understanding and wishes to proceed.  I answered all of patient's questions today.  Patient will be made n.p.o. after midnight for procedure tomorrow.   -I discussed the plan in detail with Dr. [name] MD and he agrees with the plan.   Marcie Bal [facility] 07/12/2023 11:52 AM

## 2023-07-12 NOTE — Plan of Care (Signed)
  Problem: Clinical Measurements: Goal: Ability to maintain clinical measurements within normal limits will improve Outcome: Progressing   Problem: Clinical Measurements: Goal: Diagnostic test results will improve Outcome: Progressing   Problem: Clinical Measurements: Goal: Cardiovascular complication will be avoided Outcome: Progressing   Problem: Coping: Goal: Level of anxiety will decrease Outcome: Progressing   Problem: Elimination: Goal: Will not experience complications related to bowel motility Outcome: Progressing

## 2023-07-12 NOTE — Progress Notes (Signed)
[facility]  ROUNDING NOTE   Subjective:   Sarah Lane  is a 57 y.o. female with a PMHx of diabetes mellitus type 2, seizure disorder, hypertension, dysrhythmia, and history of ovarian cancer.  Patient presents to the emergency department with difficulty urinating and has been admitted for Hypoglycemia [E16.2] Acute renal failure, unspecified acute renal failure type (HCC) [N17.9] Acute kidney injury superimposed on chronic kidney disease (HCC) [N17.9, N18.9]  Patient is known to our practice from previous admission.   Patient seen resting in bed Awaiting breakfast Appetite remains poor UOP decreased as well  Creatinine 6.58 LR 86ml./hr  Objective:  Vital signs in last 24 hours:  Temp:  [97.4 F (36.3 C)-98.4 F (36.9 C)] 97.4 F (36.3 C) (12/02 0837) Pulse Rate:  [69-74] 71 (12/02 0839) Resp:  [16-18] 18 (12/02 0837) BP: (111-130)/(64-105) 130/83 (12/02 0839) SpO2:  [96 %-97 %] 97 % (12/02 0839)  Weight change:  Filed Weights   07/09/23 2059  Weight: 61.2 kg    Intake/Output: I/O last 3 completed shifts: In: 2197.7 [I.V.:2197.7] Out: 160 [Urine:160]   Intake/Output this shift:  No intake/output data recorded.  Physical Exam: General: NAD  Head: Normocephalic, atraumatic.  Dry oral mucosal membranes  Eyes: Anicteric  Lungs:  Clear to auscultation, normal effort  Heart: Regular rate and rhythm  Abdomen:  Soft, nontender, nondistended  Extremities: No peripheral edema.  Neurologic: Alert and oriented, moving all four extremities  Skin: No lesions  Access: None    Basic Metabolic Panel: Recent Labs  Lab 07/09/23 2115 07/10/23 0415 07/10/23 0910 07/11/23 0857 07/12/23 0217  NA 131* 133* 130* 131* 132*  K 5.2* 3.9 4.1 3.2* 3.0*  CL 90* 95* 90* 84* 86*  CO2 16* 15* 18* 29 31  GLUCOSE 53* 82 171* 119* 140*  BUN 72* 73* 73* 68* 66*  CREATININE 5.71* 5.85* 6.22* 6.54* 6.58*  CALCIUM 9.9 9.0 8.9 8.1* 7.8*  PHOS  --   --   --  2.9 2.6     Liver Function Tests: Recent Labs  Lab 07/09/23 2115 07/11/23 0857 07/12/23 0217  AST 26  --   --   ALT 14  --   --   ALKPHOS 52  --   --   BILITOT 1.3*  --   --   PROT 7.8  --   --   ALBUMIN 4.5 3.2* 3.1*   Recent Labs  Lab 07/09/23 2115  LIPASE 120*   No results for input(s): "AMMONIA" in the last 168 hours.  CBC: Recent Labs  Lab 07/09/23 2115 07/10/23 0415 07/11/23 0857  WBC 14.8* 10.7* 9.5  NEUTROABS 9.9*  --   --   HGB 13.8 11.5* 11.2*  HCT 41.7 34.3* 31.1*  MCV 90.3 89.3 83.2  PLT 296 202 184    Cardiac Enzymes: No results for input(s): "CKTOTAL", "CKMB", "CKMBINDEX", "TROPONINI" in the last 168 hours.  BNP: Invalid input(s): "POCBNP"  CBG: Recent Labs  Lab 07/10/23 2345 07/11/23 0601 07/11/23 2140 07/12/23 0839 07/12/23 1139  GLUCAP 149* 130* 138* 120* 130*    Microbiology: Results for orders placed or performed during the hospital encounter of 06/01/23  Urine Culture     Status: Abnormal   Collection Time: 06/01/23  6:05 PM   Specimen: Urine, Random  Result Value Ref Range Status   Specimen Description   Final    URINE, RANDOM Performed at [facility], [address], [place], [place] [address]    Special Requests  Final    NONE Performed at [facility], [address] [place], [place] [address]    Culture MULTIPLE SPECIES PRESENT, SUGGEST RECOLLECTION (A)  Final   Report Status 06/03/2023 FINAL  Final    Coagulation Studies: No results for input(s): "LABPROT", "INR" in the last 72 hours.  Urinalysis: Recent Labs    07/11/23 0530  COLORURINE YELLOW*  LABSPEC 1.006  PHURINE 7.0  GLUCOSEU NEGATIVE  HGBUR SMALL*  BILIRUBINUR NEGATIVE  KETONESUR NEGATIVE  PROTEINUR 30*  NITRITE NEGATIVE  LEUKOCYTESUR LARGE*      Imaging: No results found.   Medications:    cefTRIAXone (ROCEPHIN)  IV 1 g (07/12/23 1003)   lactated ringers 75 mL/hr at 07/12/23 7829    aspirin  81 mg Oral Daily    atorvastatin  80 mg Oral Daily   heparin injection (subcutaneous)  5,000 Units Subcutaneous Q12H   metoprolol succinate  12.5 mg Oral Daily   pantoprazole  40 mg Oral Daily   potassium chloride  40 mEq Oral Q4H   sodium chloride flush  3 mL Intravenous Q12H   acetaminophen **OR** acetaminophen, magnesium hydroxide, ondansetron **OR** ondansetron (ZOFRAN) IV, sodium chloride flush, traZODone  Assessment/ Plan:  Sarah Lane is a 57 y.o.  female  is a 57 y.o. female with a PMHx of diabetes mellitus type 2, seizure disorder, hypertension, dysrhythmia, and history of ovarian cancer.  Patient presents to the emergency department complaining of difficulty with urination.  Patient has been admitted for Hypoglycemia [E16.2] Acute renal failure, unspecified acute renal failure type (HCC) [N17.9] Acute kidney injury superimposed on chronic kidney disease (HCC) [N17.9, N18.9]   Acute Kidney Injury on chronic kidney disease stage IIIa with baseline creatinine 1.27 and GFR of 49 on 04/07/23.  Acute kidney injury resulting in ATN secondary to volume depletion and continued ACE/ARB use. CT abdomen pelvis showed bilateral nonobstructing cyst.  Renal ultrasound confirms no obstruction and presence of simple renal cyst.  Also questionable for cystitis.   Creatinine remains elevated with little urine output.  Creatinine not responded to IV fluid resuscitation.  Discussed with patient the desired plan to initiate hemodialysis.  Risk and benefits discussed.  Patient agreeable to proceed.  Vascular surgery consulted for HD access placement tomorrow.  Will offer dialysis treatments and assess for renal recovery.  Hepatitis B panel ordered in preparation for analysis.  Lab Results  Component Value Date   CREATININE 6.58 (H) 07/12/2023   CREATININE 6.54 (H) 07/11/2023   CREATININE 6.22 (H) 07/10/2023    Intake/Output Summary (Last 24 hours) at 07/12/2023 1249 Last data filed at 07/11/2023 1600 Gross per 24  hour  Intake --  Output 150 ml  Net -150 ml   2.  Hyponatremia/hyperkalemia.  Potassium 5.2 on admission, corrected to 4.1 after shifting measures.  Sodium 131 on admission.  Likely due to poor oral intake in combination with kidney injury.    Potassium and sodium remains decreased today.  Continue IV hydration.  These will correct with dialysis.  3.  Acute metabolic acidosis.  Serum bicarb 16 on admission. Corrected with IVF  4. Anemia of chronic kidney disease Lab Results  Component Value Date   HGB 11.2 (L) 07/11/2023    Hemoglobin at goal.  Will continue to monitor for now.  5. Diabetes mellitus type II with chronic kidney disease: noninsulin dependent. Home regimen includes metformin. Most recent hemoglobin A1c is 8.2 on 03/31/23.   Glucose well-controlled.    LOS: 3 Chessie Neuharth 12/2/202412:49  PM

## 2023-07-12 NOTE — Progress Notes (Signed)
Progress Note   Patient: Sarah Lane:[id number] DOB: September 10, 1965 DOA: 07/09/2023     3 DOS: the patient was seen and examined on 07/12/2023   Brief hospital course: HPI on admission: "Sarah Lane is a 57 y.o. female with medical history significant for asthma, HFrEF, type 2 diabetes mellitus, hypertension, PSVT and cardiomyopathy, who presented to the emergency room with acute onset of urinary retention.  The patient is not urinated at all since yesterday.  She stated that she has eaten something but her sugar were still very low.  Her appetite has been diminished though.  She admitted to upper abdominal discomfort without nausea or vomiting or diarrhea or constipation. ..."  See H&P for full HPI on admission & ED course.  Pt was found to have acute renal failure with Cr of 5.71, up from 2.45 recently on 06/23/2023.    Admitted to hospitalist service for further evaluation and management with nephrology consulted.   Assessment and Plan:  * Acute kidney injury superimposed on chronic kidney disease (HCC) Cr 5.85 on admission >> 6.22 >> 6.54 >> 6.58 today Suspect ATN.   Renal U/S without obstruction. --Nephrology consulted --Plan to start dialysis --Vascular consulted for access placement --Off IV fluids --Strict I/O's to monitor urine output --Bladder scans PRN --Avoid nephrotoxins & hypotension   Hypoglycemia POA, suspect due to severe AKI. CBG was 45 on admission & had recurrent episodes on hospital day 1. Now improved with CBG's in 80's to 140's --Hypoglycemia protocol --Q4H CBG's --Hold any insulin or anti-diabetic meds  Non-insulin dependent type 2 diabetes mellitus (HCC) See hypoglycemia for mgmt. Hold metformin.  Dyslipidemia On statin  Essential hypertension Hold home Cozaar with renal failure. Continue Toprol-XL  UTI (urinary tract infection) Minimal urine output since admission. UA turbin with large leukocytes, few bacteria, > 50 WBC's --Follow  urine culture - growing GNR's --Continue Rocephin  GERD without esophagitis On PPI  High anion gap metabolic acidosis Resolved.  Due to acute renal failure. Monitor BMP. Off IV fluids        Subjective: Pt awake sitting up in bed, on phone.  She still feels weak and fatigued. Denies other acute complaints.  Still minimal almost no urine output.   Physical Exam: Vitals:   07/12/23 0455 07/12/23 0837 07/12/23 0839 07/12/23 1555  BP: 111/72 (!) 127/105 130/83 (!) 93/53  Pulse: 74 72 71 84  Resp: 16 18    Temp: 98.2 F (36.8 C) (!) 97.4 F (36.3 C)  97.9 F (36.6 C)  TempSrc: Oral Oral  Oral  SpO2: 97% 96% 97% 96%  Weight:      Height:       General exam: awake, alert, no acute distress, chronically ill appearing HEENT: moist mucus membranes, hearing grossly normal, poor dentition Respiratory system: on room air, normal respiratory effort. Cardiovascular system: RRR, no peripheral edema.   Central nervous system: A&O x 3. no gross focal neurologic deficits, stable expressive aphasia Extremities: no edema, normal tone Skin: dry, intact, no rashes seen on visualized skin Psychiatry: normal mood, congruent affect, judgement and insight appear normal   Data Reviewed:  Notable labs ---  Na 132 Cl 86 Glucose 140 Cr increased 6.58 Albumin 3.1  CBG's in lower 100's. No hypoglycemic episodes.  Renal U/S -- no obstruction   Family Communication: none present. Pt able to update.     Disposition: Status is: Inpatient Remains inpatient appropriate because: persistent renal failure. Needs hemodialysis.     Planned Discharge Destination: Home  Time spent: 38 minutes  Author: Pennie Banter, DO 07/12/2023 6:25 PM  For on call review [email or web address].

## 2023-07-13 ENCOUNTER — Encounter: Payer: Self-pay | Admitting: Vascular Surgery

## 2023-07-13 ENCOUNTER — Encounter
Admission: EM | Disposition: A | Discharge status: Home / Self Care | Payer: Self-pay | Source: Home / Self Care | Attending: Internal Medicine

## 2023-07-13 DIAGNOSIS — N189 Chronic kidney disease, unspecified: Secondary | ICD-10-CM | POA: Diagnosis not present

## 2023-07-13 DIAGNOSIS — N186 End stage renal disease: Secondary | ICD-10-CM | POA: Diagnosis not present

## 2023-07-13 DIAGNOSIS — E876 Hypokalemia: Secondary | ICD-10-CM | POA: Diagnosis present

## 2023-07-13 DIAGNOSIS — N179 Acute kidney failure, unspecified: Secondary | ICD-10-CM | POA: Diagnosis not present

## 2023-07-13 HISTORY — PX: DIALYSIS/PERMA CATHETER INSERTION: CATH118288

## 2023-07-13 LAB — RENAL FUNCTION PANEL
Albumin: 3.1 g/dL — ABNORMAL LOW (ref 3.5–5.0)
Anion gap: 14 (ref 5–15)
BUN: 59 mg/dL — ABNORMAL HIGH (ref 6–20)
CO2: 31 mmol/L (ref 22–32)
Calcium: 7.6 mg/dL — ABNORMAL LOW (ref 8.9–10.3)
Chloride: 92 mmol/L — ABNORMAL LOW (ref 98–111)
Creatinine, Ser: 6.34 mg/dL — ABNORMAL HIGH (ref 0.44–1.00)
GFR, Estimated: 7 mL/min — ABNORMAL LOW (ref >60)
Glucose, Bld: 107 mg/dL — ABNORMAL HIGH (ref 70–99)
Phosphorus: 2.2 mg/dL — ABNORMAL LOW (ref 2.5–4.6)
Potassium: 3.2 mmol/L — ABNORMAL LOW (ref 3.5–5.1)
Sodium: 137 mmol/L (ref 135–145)

## 2023-07-13 LAB — GLUCOSE, CAPILLARY
Glucose-Capillary: 95 mg/dL (ref 70–99)
Glucose-Capillary: 92 mg/dL (ref 70–99)
Glucose-Capillary: 95 mg/dL (ref 70–99)
Glucose-Capillary: 116 mg/dL — ABNORMAL HIGH (ref 70–99)
Glucose-Capillary: 121 mg/dL — ABNORMAL HIGH (ref 70–99)
Glucose-Capillary: 115 mg/dL — ABNORMAL HIGH (ref 70–99)

## 2023-07-13 LAB — HEPATITIS B SURFACE ANTIBODY, QUANTITATIVE: Hep B S AB Quant (Post): <3.5 m[IU]/mL — ABNORMAL LOW

## 2023-07-13 LAB — URINE CULTURE: Culture: >=100000 — AB

## 2023-07-13 SURGERY — DIALYSIS/PERMA CATHETER INSERTION
Anesthesia: Moderate Sedation

## 2023-07-13 MED ORDER — POTASSIUM CHLORIDE CRYS ER 20 MEQ PO TBCR
40.0000 meq | EXTENDED_RELEASE_TABLET | ORAL | Status: AC
  Filled 2023-07-13: qty 2

## 2023-07-13 MED ORDER — HEPARIN (PORCINE) IN NACL 1000-0.9 UT/500ML-% IV SOLN
INTRAVENOUS | Status: DC | PRN
  Administered 2023-07-13: 500 mL

## 2023-07-13 MED ORDER — CEFAZOLIN SODIUM-DEXTROSE 1-4 GM/50ML-% IV SOLN
INTRAVENOUS | Status: AC
  Filled 2023-07-13: qty 50

## 2023-07-13 MED ORDER — MIDAZOLAM HCL 2 MG/ML PO SYRP
8.0000 mg | ORAL_SOLUTION | Freq: Once | ORAL | Status: DC | PRN
  Filled 2023-07-13: qty 5

## 2023-07-13 MED ORDER — CEFAZOLIN SODIUM-DEXTROSE 1-4 GM/50ML-% IV SOLN
1.0000 g | INTRAVENOUS | Status: AC
Start: 2023-07-13 — End: 2023-07-13
  Administered 2023-07-13: 1 g via INTRAVENOUS
  Filled 2023-07-13: qty 50

## 2023-07-13 MED ORDER — HEPARIN SODIUM (PORCINE) 10000 UNIT/ML IJ SOLN
INTRAMUSCULAR | Status: DC | PRN
  Administered 2023-07-13: 10000 [IU]

## 2023-07-13 MED ORDER — MIDAZOLAM HCL 2 MG/2ML IJ SOLN
INTRAMUSCULAR | Status: AC
  Filled 2023-07-13: qty 2

## 2023-07-13 MED ORDER — DIPHENHYDRAMINE HCL 50 MG/ML IJ SOLN: 50.0000 mg | Freq: Once | INTRAMUSCULAR | Status: DC | PRN

## 2023-07-13 MED ORDER — FENTANYL CITRATE PF 50 MCG/ML IJ SOSY
PREFILLED_SYRINGE | INTRAMUSCULAR | Status: AC
  Filled 2023-07-13: qty 1

## 2023-07-13 MED ORDER — ONDANSETRON HCL 4 MG/2ML IJ SOLN
INTRAMUSCULAR | Status: AC
Start: 2023-07-13 — End: ?
  Filled 2023-07-13: qty 2

## 2023-07-13 MED ORDER — METHYLPREDNISOLONE SODIUM SUCC 125 MG IJ SOLR: 125.0000 mg | Freq: Once | INTRAMUSCULAR | Status: DC | PRN

## 2023-07-13 MED ORDER — FENTANYL CITRATE (PF) 100 MCG/2ML IJ SOLN
INTRAMUSCULAR | Status: DC | PRN
  Administered 2023-07-13: 12.5 ug via INTRAVENOUS
  Administered 2023-07-13: 50 ug via INTRAVENOUS
  Administered 2023-07-13: 12.5 ug via INTRAVENOUS

## 2023-07-13 MED ORDER — MIDAZOLAM HCL 2 MG/2ML IJ SOLN
INTRAMUSCULAR | Status: AC
Start: 2023-07-13 — End: ?
  Filled 2023-07-13: qty 2

## 2023-07-13 MED ORDER — CHLORHEXIDINE GLUCONATE CLOTH 2 % EX PADS
6.0000 | MEDICATED_PAD | Freq: Every day | CUTANEOUS | Status: DC
Start: 2023-07-13 — End: 2023-07-17
  Administered 2023-07-16 – 2023-07-17 (×2): 6 via TOPICAL

## 2023-07-13 MED ORDER — FENTANYL CITRATE PF 50 MCG/ML IJ SOSY
12.5000 ug | PREFILLED_SYRINGE | Freq: Once | INTRAMUSCULAR | Status: DC | PRN
Start: 2023-07-13 — End: 2023-07-13

## 2023-07-13 MED ORDER — SODIUM CHLORIDE 0.9 % IV SOLN: INTRAVENOUS | Status: DC

## 2023-07-13 MED ORDER — FAMOTIDINE 20 MG PO TABS: 40.0000 mg | ORAL_TABLET | Freq: Once | ORAL | Status: DC | PRN

## 2023-07-13 MED ORDER — LIDOCAINE-EPINEPHRINE (PF) 1 %-1:200000 IJ SOLN
INTRAMUSCULAR | Status: DC | PRN
  Administered 2023-07-13: 20 mL
  Administered 2023-07-13: 10 mL

## 2023-07-13 MED ORDER — MIDAZOLAM HCL 2 MG/2ML IJ SOLN
INTRAMUSCULAR | Status: DC | PRN
  Administered 2023-07-13 (×2): .5 mg via INTRAVENOUS
  Administered 2023-07-13: 2 mg via INTRAVENOUS

## 2023-07-13 SURGICAL SUPPLY — 9 items
CATH CANNON HEMO 15FR 19 (HEMODIALYSIS SUPPLIES) IMPLANT
COVER PROBE ULTRASOUND 5X96 (MISCELLANEOUS) IMPLANT
DERMABOND ADVANCED .7 DNX12 (GAUZE/BANDAGES/DRESSINGS) IMPLANT
DRAPE INCISE IOBAN 66X45 STRL (DRAPES) IMPLANT
NDL ENTRY 21GA 7CM ECHOTIP (NEEDLE) IMPLANT
NEEDLE ENTRY 21GA 7CM ECHOTIP (NEEDLE) ×1 IMPLANT
SET INTRO CAPELLA COAXIAL (SET/KITS/TRAYS/PACK) IMPLANT
SUT MNCRL AB 4-0 PS2 18 (SUTURE) IMPLANT
SUT SILK 0 FSL (SUTURE) IMPLANT

## 2023-07-13 NOTE — Progress Notes (Signed)
   07/13/23 2305  Vitals  BP 109/69  BP Location Right Arm  BP Method Automatic  Patient Position (if appropriate) Lying  Pulse Rate 74  Pulse Rate Source Monitor  ECG Heart Rate 74  Resp 18  Oxygen Therapy  SpO2 100 %  O2 Device Room Air  O2 Flow Rate (L/min) 2 L/min  During Treatment Monitoring  Blood Flow Rate (mL/min) 199 mL/min  Arterial Pressure (mmHg) -93.93 mmHg  Venous Pressure (mmHg) 98.98 mmHg  TMP (mmHg) 12.93 mmHg  Ultrafiltration Rate (mL/min) 251 mL/min  Dialysate Flow Rate (mL/min) 300 ml/min  Dialysate Potassium Concentration 4  Dialysate Calcium Concentration 2.25  Duration of HD Treatment -hour(s) 2.08 hour(s)  Cumulative Fluid Removed (mL) per Treatment  -99.95  HD Safety Checks Performed Yes  Intra-Hemodialysis Comments Tx completed  Post Treatment  Dialyzer Clearance Lightly streaked  Hemodialysis Intake (mL) 100 mL  Liters Processed 25  Fluid Removed (mL) 0 mL  Tolerated HD Treatment Yes  Post-Hemodialysis Comments pt stable  Hemodialysis Catheter Right Internal jugular Double lumen Permanent (Tunneled)  Placement Date/Time: 07/13/23 1428   Serial / Lot #: [id number]  Expiration Date: 02/06/26  Time Out: Correct patient;Correct procedure;Correct site  Maximum sterile barrier precautions: Hand hygiene;Large sterile sheet;Sterile probe cover;Cap;Mask;St...  Site Condition No complications  Blue Lumen Status Heparin locked  Red Lumen Status Heparin locked  Catheter fill solution Heparin 1000 units/ml  Catheter fill volume (Arterial) 2 cc  Catheter fill volume (Venous) 2  Dressing Type Gauze/Drain sponge  Dressing Status Clean, Dry, Intact  Interventions Dressing reinforced  Drainage Description None  Dressing Change Due 07/14/23  Post treatment catheter status Capped and Clamped   Pt tolerated treatment well. Pt c/o mind nausea, oxygen administered 2lpm. No fluid removal. Dialysis is ordered for 07/14/2023. Hand off to [employer].

## 2023-07-13 NOTE — Progress Notes (Addendum)
[facility]  ROUNDING NOTE   Subjective:   Sarah Lane  is a 57 y.o. female with a PMHx of diabetes mellitus type 2, seizure disorder, hypertension, dysrhythmia, and history of ovarian cancer.  Patient presents to the emergency department with difficulty urinating and has been admitted for Hypoglycemia [E16.2] Acute renal failure, unspecified acute renal failure type (HCC) [N17.9] Acute kidney injury superimposed on chronic kidney disease (HCC) [N17.9, N18.9]  Patient is known to our practice from previous admission.   Update Patient seen resting in bed This provider spoke with sister, Gunnar Fusi, on speaker phone Patient states that she feels well Appetite remains poor Continues to void small amounts  Creatinine 6.34 50 mL urine output recorded overnight  Objective:  Vital signs in last 24 hours:  Temp:  [97.8 F (36.6 C)-98.2 F (36.8 C)] 97.8 F (36.6 C) (12/03 1303) Pulse Rate:  [73-84] 76 (12/03 1303) Resp:  [15-18] 15 (12/03 1303) BP: (93-140)/(53-96) 140/80 (12/03 1303) SpO2:  [96 %-98 %] 97 % (12/03 1303)  Weight change:  Filed Weights   07/09/23 2059  Weight: 61.2 kg    Intake/Output: I/O last 3 completed shifts: In: 93.7 [IV Piggyback:93.7] Out: 50 [Urine:50]   Intake/Output this shift:  No intake/output data recorded.  Physical Exam: General: NAD  Head: Normocephalic, atraumatic.  Dry oral mucosal membranes  Eyes: Anicteric  Lungs:  Clear to auscultation, normal effort  Heart: Regular rate and rhythm  Abdomen:  Soft, nontender, nondistended  Extremities: No peripheral edema.  Neurologic: Alert and oriented, moving all four extremities  Skin: No lesions  Access: None    Basic Metabolic Panel: Recent Labs  Lab 07/10/23 0415 07/10/23 0910 07/11/23 0857 07/12/23 0217 07/13/23 0517  NA 133* 130* 131* 132* 137  K 3.9 4.1 3.2* 3.0* 3.2*  CL 95* 90* 84* 86* 92*  CO2 15* 18* 29 31 31   GLUCOSE 82 171* 119* 140* 107*  BUN 73* 73* 68*  66* 59*  CREATININE 5.85* 6.22* 6.54* 6.58* 6.34*  CALCIUM 9.0 8.9 8.1* 7.8* 7.6*  PHOS  --   --  2.9 2.6 2.2*    Liver Function Tests: Recent Labs  Lab 07/09/23 2115 07/11/23 0857 07/12/23 0217 07/13/23 0517  AST 26  --   --   --   ALT 14  --   --   --   ALKPHOS 52  --   --   --   BILITOT 1.3*  --   --   --   PROT 7.8  --   --   --   ALBUMIN 4.5 3.2* 3.1* 3.1*   Recent Labs  Lab 07/09/23 2115  LIPASE 120*   No results for input(s): "AMMONIA" in the last 168 hours.  CBC: Recent Labs  Lab 07/09/23 2115 07/10/23 0415 07/11/23 0857  WBC 14.8* 10.7* 9.5  NEUTROABS 9.9*  --   --   HGB 13.8 11.5* 11.2*  HCT 41.7 34.3* 31.1*  MCV 90.3 89.3 83.2  PLT 296 202 184    Cardiac Enzymes: No results for input(s): "CKTOTAL", "CKMB", "CKMBINDEX", "TROPONINI" in the last 168 hours.  BNP: Invalid input(s): "POCBNP"  CBG: Recent Labs  Lab 07/12/23 1556 07/12/23 2124 07/13/23 0906 07/13/23 1246 07/13/23 1314  GLUCAP 128* 135* 95 95 92    Microbiology: Results for orders placed or performed during the hospital encounter of 07/09/23  Urine Culture (for pregnant, neutropenic or urologic patients or patients with an indwelling urinary catheter)     Status: Abnormal  Collection Time: 07/11/23  5:30 AM   Specimen: Urine, Clean Catch  Result Value Ref Range Status   Specimen Description   Final    URINE, CLEAN CATCH Performed at [facility], [address] [place], [place] [address]    Special Requests   Final    NONE Performed at [facility], [address] [place], [place] [address]    Culture >=100,000 COLONIES/mL KLEBSIELLA PNEUMONIAE (A)  Final   Report Status 07/13/2023 FINAL  Final   Organism ID, Bacteria KLEBSIELLA PNEUMONIAE (A)  Final      Susceptibility   Klebsiella pneumoniae - MIC*    AMPICILLIN RESISTANT Resistant     CEFAZOLIN <=4 SENSITIVE Sensitive     CEFEPIME <=0.12 SENSITIVE Sensitive     CEFTRIAXONE <=0.25 SENSITIVE  Sensitive     CIPROFLOXACIN <=0.25 SENSITIVE Sensitive     GENTAMICIN <=1 SENSITIVE Sensitive     IMIPENEM <=0.25 SENSITIVE Sensitive     NITROFURANTOIN 32 SENSITIVE Sensitive     TRIMETH/SULFA <=20 SENSITIVE Sensitive     AMPICILLIN/SULBACTAM 4 SENSITIVE Sensitive     PIP/TAZO <=4 SENSITIVE Sensitive ug/mL    * >=100,000 COLONIES/mL KLEBSIELLA PNEUMONIAE    Coagulation Studies: No results for input(s): "LABPROT", "INR" in the last 72 hours.  Urinalysis: Recent Labs    07/11/23 0530  COLORURINE YELLOW*  LABSPEC 1.006  PHURINE 7.0  GLUCOSEU NEGATIVE  HGBUR SMALL*  BILIRUBINUR NEGATIVE  KETONESUR NEGATIVE  PROTEINUR 30*  NITRITE NEGATIVE  LEUKOCYTESUR LARGE*      Imaging: No results found.   Medications:    sodium chloride 10 mL/hr at 07/13/23 1036    ceFAZolin (ANCEF) IV     [MAR Hold] cefTRIAXone (ROCEPHIN)  IV 1 g (07/13/23 1038)    [MAR Hold] aspirin  81 mg Oral Daily   [MAR Hold] atorvastatin  80 mg Oral Daily   [MAR Hold] Chlorhexidine Gluconate Cloth  6 each Topical Q0600   [MAR Hold] heparin injection (subcutaneous)  5,000 Units Subcutaneous Q12H   [MAR Hold] metoprolol succinate  12.5 mg Oral Daily   [MAR Hold] pantoprazole  40 mg Oral Daily   [MAR Hold] potassium chloride  40 mEq Oral Q4H   [MAR Hold] sodium chloride flush  3 mL Intravenous Q12H   [MAR Hold] acetaminophen **OR** [MAR Hold] acetaminophen, diphenhydrAMINE, famotidine, fentaNYL (SUBLIMAZE) injection, [MAR Hold] magnesium hydroxide, methylPREDNISolone (SOLU-MEDROL) injection, midazolam, [MAR Hold] ondansetron **OR** [MAR Hold] ondansetron (ZOFRAN) IV, [MAR Hold] sodium chloride flush, [MAR Hold] traZODone  Assessment/ Plan:  Sarah Lane is a 57 y.o.  female  is a 57 y.o. female with a PMHx of diabetes mellitus type 2, seizure disorder, hypertension, dysrhythmia, and history of ovarian cancer.  Patient presents to the emergency department complaining of difficulty with urination.  Patient  has been admitted for Hypoglycemia [E16.2] Acute renal failure, unspecified acute renal failure type (HCC) [N17.9] Acute kidney injury superimposed on chronic kidney disease (HCC) [N17.9, N18.9]   Acute Kidney Injury on chronic kidney disease stage IIIa with baseline creatinine 1.27 and GFR of 49 on 04/07/23.  Acute kidney injury resulting in ATN secondary to volume depletion and continued ACE/ARB use. CT abdomen pelvis showed bilateral nonobstructing cyst.  Renal ultrasound confirms no obstruction and presence of simple renal cyst.  Also questionable for cystitis.  Will order serology workup to evaluate any underlying cause.  Appreciate vascular placing PermCath later today.  Will initiate dialysis after placement.  Spoke with patient and sister (on speaker phone).  Answered all questions to the best of my ability.  All parties agreeable to proceed.  Patient will receive 3 days of dialysis treatments.  Will seek outpatient clinic for continued treatments, if needed at discharge.  Lab Results  Component Value Date   CREATININE 6.34 (H) 07/13/2023   CREATININE 6.58 (H) 07/12/2023   CREATININE 6.54 (H) 07/11/2023    Intake/Output Summary (Last 24 hours) at 07/13/2023 1342 Last data filed at 07/13/2023 0600 Gross per 24 hour  Intake 93.7 ml  Output 50 ml  Net 43.7 ml   2.  Hyponatremia/hyperkalemia.  Potassium 5.2 on admission, corrected to 4.1 after shifting measures.  Sodium 131 on admission.  Likely due to poor oral intake in combination with kidney injury.    Sodium corrected to 137.  Potassium slightly decreased, 3.2.  IV hydration stopped, patient encouraged to increase oral intake.  Electrolytes will stabilize with dialysis.  3.  Acute metabolic acidosis.  Serum bicarb corrected to 31 today.  4. Anemia of chronic kidney disease Lab Results  Component Value Date   HGB 11.2 (L) 07/11/2023    Hemoglobin remains acceptable.  Will continue to monitor for now.  5. Diabetes mellitus  type II with chronic kidney disease: noninsulin dependent. Home regimen includes metformin. Most recent hemoglobin A1c is 8.2 on 03/31/23.   Primary team to manage sliding scale insulin.  Continue to hold metformin due to acute kidney injury.    LOS: 4 Deijah Spikes 12/3/20241:42 PM

## 2023-07-13 NOTE — Interval H&P Note (Signed)
History and Physical Interval Note:  07/13/2023 2:01 PM  Sarah Lane  has presented today for surgery, with the diagnosis of ESRD.  The various methods of treatment have been discussed with the patient and family. After consideration of risks, benefits and other options for treatment, the patient has consented to  Procedure(s): DIALYSIS/PERMA CATHETER INSERTION (N/A) as a surgical intervention.  The patient's history has been reviewed, patient examined, no change in status, stable for surgery.  I have reviewed the patient's chart and labs.  Questions were answered to the patient's satisfaction.     [name]

## 2023-07-13 NOTE — Op Note (Signed)
[facility]   OPERATIVE NOTE     PROCEDURE: 1. Insertion of a right IJ tunneled dialysis catheter. 2. Catheter placement and cannulation under ultrasound and fluoroscopic guidance  PRE-OPERATIVE DIAGNOSIS: end-stage renal requiring hemodialysis  POST-OPERATIVE DIAGNOSIS: same as above  SURGEON: [name]  ANESTHESIA: Conscious sedation was administered under my direct supervision by the interventional radiology RN. IV Versed plus fentanyl were utilized. Continuous ECG, pulse oximetry and blood pressure was monitored throughout the entire procedure.  Conscious sedation was for a total of 40 minutes 24 seconds.  ESTIMATED BLOOD LOSS: Minimal  FINDING(S): 1.  Tips of the catheter in the right atrium on fluoroscopy 2.  No obvious pneumothorax on fluoroscopy  SPECIMEN(S):  none  INDICATIONS:   Sarah Lane is a 57 y.o. female  presents with end stage renal disease.  Therefore, the patient requires a tunneled dialysis catheter placement.  The patient is informed of  the risks catheter placement include but are not limited to: bleeding, infection, central venous injury, pneumothorax, possible venous stenosis, possible malpositioning in the venous system, and possible infections related to long-term catheter presence.  The patient was aware of these risks and agreed to proceed.  DESCRIPTION: The patient was taken back to Special Procedure suite.  Prior to sedation, the patient was given IV antibiotics.  After obtaining adequate sedation, the patient was prepped and draped in the standard fashion for a right IJ tunneled dialysis catheter placement.  Appropriate Time Out is called.     The right neck and chest wall are then infiltrated with 1% Lidocaine with epinepherine.  A 19 cm tip to cuff  catheter is then selected, opened on the back table and prepped. Ultrasound is placed in a sterile sleeve.  Under ultrasound guidance, the right internal jugular vein is  examined and is noted to be echolucent and easily compressible indicating patency.   An image is recorded for the permanent record.  The right internal jugular vein is cannulated with the microneedle under direct ultrasound vissualization.  A Microwire followed by a micro sheath is then inserted without difficulty.   A J-wire was then advanced under fluoroscopic guidance into the inferior vena cava and the wire was secured.  Small counter incision was then made at the wire insertion site. A small pocket was fashioned with blunt dissection to allow easier passage of the cuff.  The dilator and peel-away sheath are then advanced over the wire under fluoroscopic guidance. The catheters and advanced through the peel-away sheath. It is approximated to the chest wall after verifying the tips at the atrial caval junction and an exit site is selected.  Small incision is made at the selected exit site and the tunneling device was passed subcutaneously to the counter incision. Catheter is then pulled through the subcutaneous tunnel. The catheter is then verified for tip position under fluoroscopy, transected and the hub assembly connected.    Each port was tested by aspirating and flushing.  No resistance was noted.  Each port was then thoroughly flushed with heparinized saline.  The catheter was secured in placed with two interrupted stitches of 0 silk tied to the catheter.  The counter incision was closed with a U-stitch of 4-0 Monocryl.  The insertion site is then cleaned and sterile bandages applied including a Biopatch.  Each port was then packed with concentrated heparin (1000 Units/mL) at the manufacturer recommended volumes to each port.  Sterile caps were applied to each port.  On completion fluoroscopy,  the tips of the catheter were in the right atrium, and there was no evidence of pneumothorax.  COMPLICATIONS: None  CONDITION: Unchanged   [name] [facility] Office:  [phone]   07/13/2023, 2:49 PM

## 2023-07-13 NOTE — Progress Notes (Addendum)
Progress Note   Patient: Sarah Lane:[id number] DOB: 18-Nov-1965 DOA: 07/09/2023     4 DOS: the patient was seen and examined on 07/13/2023   Brief hospital course: HPI on admission: "Sarah Lane is a 56 y.o. female with medical history significant for asthma, HFrEF, type 2 diabetes mellitus, hypertension, PSVT and cardiomyopathy, who presented to the emergency room with acute onset of urinary retention.  The patient is not urinated at all since yesterday.  She stated that she has eaten something but her sugar were still very low.  Her appetite has been diminished though.  She admitted to upper abdominal discomfort without nausea or vomiting or diarrhea or constipation. ..."  See H&P for full HPI on admission & ED course.  Pt was found to have acute renal failure with Cr of 5.71, up from 2.45 recently on 06/23/2023.    Admitted to hospitalist service for further evaluation and management with nephrology consulted.   Assessment and Plan:  * Acute kidney injury superimposed on chronic kidney disease (HCC) Cr 5.85 on admission >> 6.22 >> 6.54 >> 6.58 and still no urine output despite fluids.  Renal suspects ATN.   Renal U/S without obstruction. --Nephrology following --Plan to start dialysis --Vascular surgery consulted for HD acess --Right IJ tunneled dialysis catheter placed today --Off IV fluids --Strict I/O's to monitor urine output --Bladder scans PRN --Avoid nephrotoxins & hypotension   Hypoglycemia POA, suspect due to severe AKI. CBG was 45 on admission & had recurrent episodes on hospital day 1. Now improved with CBG's in 80's to 140's --Hypoglycemia protocol --Q4H CBG's --Hold any insulin or anti-diabetic meds  Non-insulin dependent type 2 diabetes mellitus (HCC) See hypoglycemia for mgmt. Hold metformin.  Dyslipidemia On statin  Essential hypertension Hold home Cozaar with renal failure. Continue Toprol-XL  Hypokalemia K 3.2 - replacement  ordered Monitor BMP, Mg levels  UTI (urinary tract infection) Minimal urine output since admission. UA turbin with large leukocytes, few bacteria, > 50 WBC's Urine culture grew Klebsiella pneumoniae resistant only to ampicillin --Continue Rocephin to complete course  GERD without esophagitis On PPI  Chronic HFrEF (heart failure with reduced ejection fraction) (HCC) Volume status stable / dry. Starting dialysis -- volume management Daily weights  High anion gap metabolic acidosis Resolved.  Due to acute renal failure. Monitor BMP. Off IV fluids        Subjective: Pt awake sitting up in bed this AM.  She denies any acute complaints except thirsty since NPO for dialysis cath placement later today.   Physical Exam: Vitals:   07/13/23 1450 07/13/23 1500 07/13/23 1515 07/13/23 1545  BP: (!) 102/52 (!) 110/55 113/60 104/67  Pulse: 87 85 90 93  Resp: 17 11 13    Temp:    98 F (36.7 C)  TempSrc:    Oral  SpO2: 97% 96% 97% 99%  Weight:      Height:       General exam: awake, alert, no acute distress, chronically ill appearing HEENT: moist mucus membranes, hearing grossly normal, poor dentition Respiratory system: on room air, normal respiratory effort. CTAB no wheezes or rhonchi Cardiovascular system: RRR, no peripheral edema.  + S1/S2 Central nervous system: A&O x 3. no gross focal neurologic deficits, stable expressive aphasia Extremities: no edema, normal tone Skin: dry, intact, no rashes seen on visualized skin Psychiatry: normal mood, congruent affect, judgement and insight appear normal   Data Reviewed:  Notable labs ---   K 3.2 Cl 92 Glucose 107 Cr  6.58 >> 6.34 Albumin 3.1 Phos 2.2  Family Communication: none present. Pt able to update.     Disposition: Status is: Inpatient Remains inpatient appropriate because: persistent renal failure. To be initiated on hemodialysis.     Planned Discharge Destination: Home    Time spent: 36  minutes  Author: Pennie Banter, DO 07/13/2023 4:59 PM  For on call review [email or web address].

## 2023-07-13 NOTE — Assessment & Plan Note (Signed)
Volume status stable / dry. Starting dialysis -- volume management Daily weights

## 2023-07-13 NOTE — Plan of Care (Signed)
  Problem: Clinical Measurements: Goal: Will remain free from infection Outcome: Progressing   Problem: Clinical Measurements: Goal: Diagnostic test results will improve Outcome: Progressing   Problem: Activity: Goal: Risk for activity intolerance will decrease Outcome: Progressing   Problem: Nutrition: Goal: Adequate nutrition will be maintained Outcome: Progressing   Problem: Elimination: Goal: Will not experience complications related to bowel motility Outcome: Progressing Goal: Will not experience complications related to urinary retention Outcome: Progressing

## 2023-07-13 NOTE — TOC Progression Note (Signed)
Transition of Care [facility]) - Progression Note    Patient Details  Name: Sarah Lane MRN: [id number] Date of Birth: Feb 11, 1966  [facility]) CM/SW Contact  Margarito Liner, LCSW Phone Number: 07/13/2023, 2:17 PM  Clinical Narrative: CSW faxed requested clinicals to HD coordinator.    Expected Discharge Plan: Home/Self Care Barriers to Discharge: Continued Medical Work up  Expected Discharge Plan and Services       Living arrangements for the past 2 months: Single Family Home                                       Social Determinants of Health (SDOH) Interventions SDOH Screenings   Food Insecurity: No Food Insecurity (07/10/2023)  Housing: Medium Risk (07/10/2023)  Transportation Needs: Unmet Transportation Needs (07/10/2023)  Utilities: Not At Risk (07/10/2023)  Financial Resource Strain: Medium Risk (05/17/2023)   Received from [facility]  Physical Activity: Insufficiently Active (05/17/2023)   Received from [facility]  Social Connections: Socially Isolated (05/18/2023)   Received from [facility]  Stress: No Stress Concern Present (05/18/2023)   Received from [facility]  Tobacco Use: Unknown (07/09/2023)  Health Literacy: Medium Risk (05/17/2023)   Received from [facility]    Readmission Risk Interventions    07/11/2023   11:51 AM  Readmission Risk Prevention Plan  Transportation Screening Complete  PCP or Specialist Appt within 5-7 Days Complete  Home Care Screening Complete  Medication Review (RN CM) Complete

## 2023-07-13 NOTE — Assessment & Plan Note (Signed)
K 3.2 - replacement ordered Monitor BMP, Mg levels

## 2023-07-14 ENCOUNTER — Inpatient Hospital Stay: Payer: Medicaid Other

## 2023-07-14 DIAGNOSIS — N189 Chronic kidney disease, unspecified: Secondary | ICD-10-CM | POA: Diagnosis not present

## 2023-07-14 DIAGNOSIS — N179 Acute kidney failure, unspecified: Secondary | ICD-10-CM | POA: Diagnosis not present

## 2023-07-14 LAB — RENAL FUNCTION PANEL
Albumin: 3.4 g/dL — ABNORMAL LOW (ref 3.5–5.0)
Anion gap: 13 (ref 5–15)
BUN: 29 mg/dL — ABNORMAL HIGH (ref 6–20)
CO2: 28 mmol/L (ref 22–32)
Calcium: 7.6 mg/dL — ABNORMAL LOW (ref 8.9–10.3)
Chloride: 95 mmol/L — ABNORMAL LOW (ref 98–111)
Creatinine, Ser: 3.59 mg/dL — ABNORMAL HIGH (ref 0.44–1.00)
GFR, Estimated: 14 mL/min — ABNORMAL LOW (ref >60)
Glucose, Bld: 113 mg/dL — ABNORMAL HIGH (ref 70–99)
Phosphorus: 2.1 mg/dL — ABNORMAL LOW (ref 2.5–4.6)
Potassium: 4 mmol/L (ref 3.5–5.1)
Sodium: 136 mmol/L (ref 135–145)

## 2023-07-14 LAB — CBC
HCT: 34.6 % — ABNORMAL LOW (ref 36.0–46.0)
Hemoglobin: 12.1 g/dL (ref 12.0–15.0)
MCH: 30 pg (ref 26.0–34.0)
MCHC: 35 g/dL (ref 30.0–36.0)
MCV: 85.6 fL (ref 80.0–100.0)
Platelets: 175 10*3/uL (ref 150–400)
RBC: 4.04 MIL/uL (ref 3.87–5.11)
RDW: 13.1 % (ref 11.5–15.5)
WBC: 8.7 10*3/uL (ref 4.0–10.5)
nRBC: 0 % (ref 0.0–0.2)

## 2023-07-14 LAB — GLUCOSE, CAPILLARY
Glucose-Capillary: 90 mg/dL (ref 70–99)
Glucose-Capillary: 88 mg/dL (ref 70–99)
Glucose-Capillary: 105 mg/dL — ABNORMAL HIGH (ref 70–99)

## 2023-07-14 MED ORDER — K PHOS MONO-SOD PHOS DI & MONO 155-852-130 MG PO TABS
250.0000 mg | ORAL_TABLET | Freq: Once | ORAL | Status: DC
  Filled 2023-07-14: qty 1

## 2023-07-14 MED ORDER — HEPARIN SODIUM (PORCINE) 1000 UNIT/ML IJ SOLN
INTRAMUSCULAR | Status: AC
  Filled 2023-07-14: qty 10

## 2023-07-14 NOTE — Progress Notes (Signed)
[facility]  ROUNDING NOTE   Subjective:   Sarah Lane  is a 57 y.o. female with a PMHx of diabetes mellitus type 2, seizure disorder, hypertension, dysrhythmia, and history of ovarian cancer.  Patient presents to the emergency department with difficulty urinating and has been admitted for Hypoglycemia [E16.2] Acute renal failure, unspecified acute renal failure type (HCC) [N17.9] Acute kidney injury superimposed on chronic kidney disease (HCC) [N17.9, N18.9]  Patient is known to our practice from previous admission.   Update Patient seen and evaluated during dialysis   HEMODIALYSIS FLOWSHEET:  Blood Flow Rate (mL/min): 250 mL/min Arterial Pressure (mmHg): -119.79 mmHg Venous Pressure (mmHg): 81.21 mmHg TMP (mmHg): 20.4 mmHg Ultrafiltration Rate (mL/min): 680 mL/min Dialysate Flow Rate (mL/min): 300 ml/min Bolus Amount (mL): 100 mL  Received treatment last night Tolerated treatment well today  Objective:  Vital signs in last 24 hours:  Temp:  [97.4 F (36.3 C)-98.2 F (36.8 C)] 97.9 F (36.6 C) (12/04 0821) Pulse Rate:  [64-93] 78 (12/04 1030) Resp:  [10-26] 16 (12/04 1030) BP: (99-140)/(46-105) 102/74 (12/04 1030) SpO2:  [96 %-100 %] 100 % (12/04 1030) Weight:  [62.8 kg] 62.8 kg (12/04 0824)  Weight change:  Filed Weights   07/09/23 2059 07/14/23 0824  Weight: 61.2 kg 62.8 kg    Intake/Output: I/O last 3 completed shifts: In: 157.3 [I.V.:7.3; IV Piggyback:150] Out: 50 [Urine:50]   Intake/Output this shift:  No intake/output data recorded.  Physical Exam: General: NAD  Head: Normocephalic, atraumatic.  Dry oral mucosal membranes  Eyes: Anicteric  Lungs:  Clear to auscultation, normal effort  Heart: Regular rate and rhythm  Abdomen:  Soft, nontender, nondistended  Extremities: No peripheral edema.  Neurologic: Alert and oriented, moving all four extremities  Skin: No lesions  Access: Rt chest permcath    Basic Metabolic Panel: Recent  Labs  Lab 07/10/23 0910 07/11/23 0857 07/12/23 0217 07/13/23 0517 07/14/23 0622  NA 130* 131* 132* 137 136  K 4.1 3.2* 3.0* 3.2* 4.0  CL 90* 84* 86* 92* 95*  CO2 18* 29 31 31 28   GLUCOSE 171* 119* 140* 107* 113*  BUN 73* 68* 66* 59* 29*  CREATININE 6.22* 6.54* 6.58* 6.34* 3.59*  CALCIUM 8.9 8.1* 7.8* 7.6* 7.6*  PHOS  --  2.9 2.6 2.2* 2.1*    Liver Function Tests: Recent Labs  Lab 07/09/23 2115 07/11/23 0857 07/12/23 0217 07/13/23 0517 07/14/23 0622  AST 26  --   --   --   --   ALT 14  --   --   --   --   ALKPHOS 52  --   --   --   --   BILITOT 1.3*  --   --   --   --   PROT 7.8  --   --   --   --   ALBUMIN 4.5 3.2* 3.1* 3.1* 3.4*   Recent Labs  Lab 07/09/23 2115  LIPASE 120*   No results for input(s): "AMMONIA" in the last 168 hours.  CBC: Recent Labs  Lab 07/09/23 2115 07/10/23 0415 07/11/23 0857 07/14/23 0622  WBC 14.8* 10.7* 9.5 8.7  NEUTROABS 9.9*  --   --   --   HGB 13.8 11.5* 11.2* 12.1  HCT 41.7 34.3* 31.1* 34.6*  MCV 90.3 89.3 83.2 85.6  PLT 296 202 184 175    Cardiac Enzymes: No results for input(s): "CKTOTAL", "CKMB", "CKMBINDEX", "TROPONINI" in the last 168 hours.  BNP: Invalid input(s): "POCBNP"  CBG: Recent Labs  Lab 07/13/23 1314 07/13/23 1502 07/13/23 1629 07/13/23 2121 07/14/23 0831  GLUCAP 92 116* 121* 115* 90    Microbiology: Results for orders placed or performed during the hospital encounter of 07/09/23  Urine Culture (for pregnant, neutropenic or urologic patients or patients with an indwelling urinary catheter)     Status: Abnormal   Collection Time: 07/11/23  5:30 AM   Specimen: Urine, Clean Catch  Result Value Ref Range Status   Specimen Description   Final    URINE, CLEAN CATCH Performed at [facility], [address] [place], [place] [address]    Special Requests   Final    NONE Performed at [facility], [address], [place], [place] [address]    Culture >=100,000 COLONIES/mL  KLEBSIELLA PNEUMONIAE (A)  Final   Report Status 07/13/2023 FINAL  Final   Organism ID, Bacteria KLEBSIELLA PNEUMONIAE (A)  Final      Susceptibility   Klebsiella pneumoniae - MIC*    AMPICILLIN RESISTANT Resistant     CEFAZOLIN <=4 SENSITIVE Sensitive     CEFEPIME <=0.12 SENSITIVE Sensitive     CEFTRIAXONE <=0.25 SENSITIVE Sensitive     CIPROFLOXACIN <=0.25 SENSITIVE Sensitive     GENTAMICIN <=1 SENSITIVE Sensitive     IMIPENEM <=0.25 SENSITIVE Sensitive     NITROFURANTOIN 32 SENSITIVE Sensitive     TRIMETH/SULFA <=20 SENSITIVE Sensitive     AMPICILLIN/SULBACTAM 4 SENSITIVE Sensitive     PIP/TAZO <=4 SENSITIVE Sensitive ug/mL    * >=100,000 COLONIES/mL KLEBSIELLA PNEUMONIAE    Coagulation Studies: No results for input(s): "LABPROT", "INR" in the last 72 hours.  Urinalysis: No results for input(s): "COLORURINE", "LABSPEC", "PHURINE", "GLUCOSEU", "HGBUR", "BILIRUBINUR", "KETONESUR", "PROTEINUR", "UROBILINOGEN", "NITRITE", "LEUKOCYTESUR" in the last 72 hours.  Invalid input(s): "APPERANCEUR"     Imaging: PERIPHERAL VASCULAR CATHETERIZATION  Result Date: 07/13/2023 See surgical note for result.    Medications:    cefTRIAXone (ROCEPHIN)  IV Stopped (07/14/23 0956)    aspirin  81 mg Oral Daily   atorvastatin  80 mg Oral Daily   Chlorhexidine Gluconate Cloth  6 each Topical Q0600   heparin injection (subcutaneous)  5,000 Units Subcutaneous Q12H   metoprolol succinate  12.5 mg Oral Daily   pantoprazole  40 mg Oral Daily   sodium chloride flush  3 mL Intravenous Q12H   acetaminophen **OR** acetaminophen, magnesium hydroxide, ondansetron **OR** ondansetron (ZOFRAN) IV, sodium chloride flush, traZODone  Assessment/ Plan:  Sarah Lane is a 57 y.o.  female  is a 57 y.o. female with a PMHx of diabetes mellitus type 2, seizure disorder, hypertension, dysrhythmia, and history of ovarian cancer.  Patient presents to the emergency department complaining of difficulty with  urination.  Patient has been admitted for Hypoglycemia [E16.2] Acute renal failure, unspecified acute renal failure type (HCC) [N17.9] Acute kidney injury superimposed on chronic kidney disease (HCC) [N17.9, N18.9]   Acute Kidney Injury on chronic kidney disease stage IIIa with baseline creatinine 1.27 and GFR of 49 on 04/07/23.  Acute kidney injury resulting in ATN secondary to volume depletion and continued ACE/ARB use. CT abdomen pelvis showed bilateral nonobstructing cyst.  Renal ultrasound confirms no obstruction and presence of simple renal cyst.  Also questionable for cystitis.  Will order serology workup to evaluate any underlying cause.  Dialysis initiated yesterday, 07/13/2023.  Tolerated shorten treatment last night.  Patient receiving second treatment today.  Will receive third treatment tomorrow.  Outpatient dialysis clinic search in progress.  Will  continue to monitor labs and evaluate renal recovery.  Lab Results  Component Value Date   CREATININE 3.59 (H) 07/14/2023   CREATININE 6.34 (H) 07/13/2023   CREATININE 6.58 (H) 07/12/2023    Intake/Output Summary (Last 24 hours) at 07/14/2023 1049 Last data filed at 07/13/2023 2305 Gross per 24 hour  Intake 157.29 ml  Output 0 ml  Net 157.29 ml   2.  Hyponatremia/hyperkalemia.  Potassium 5.2 on admission, corrected to 4.1 after shifting measures.  Sodium 131 on admission.  Likely due to poor oral intake in combination with kidney injury.    Sodium and potassium corrected with dialysis.  Will continue to monitor and manage as needed.  3.  Acute metabolic acidosis.  Serum bicarb corrected  4. Anemia of chronic kidney disease Lab Results  Component Value Date   HGB 12.1 07/14/2023    Hemoglobin at goal.  Will continue to monitor for now.  5. Diabetes mellitus type II with chronic kidney disease: noninsulin dependent. Home regimen includes metformin. Most recent hemoglobin A1c is 8.2 on 03/31/23.   Glucose well-controlled.     LOS: 5 Cole Klugh 12/4/202410:49 AM

## 2023-07-14 NOTE — Progress Notes (Signed)
  Received patient in bed to unit.   Informed consent signed and in chart.    TX duration: 2.5hrs     Transported back to floor  Hand-off given to patient's nurse. No c/o and no distress noted    Access used: R HD Catheter Access issues: none   Total UF removed: 1.0L Medication(s) given: none  Post HD weight: 61.9kg     Lynann Beaver  [facility]

## 2023-07-14 NOTE — Progress Notes (Signed)
PHARMACY CONSULT NOTE - FOLLOW UP  Pharmacy Consult for Electrolyte Monitoring and Replacement   Recent Labs: Potassium (mmol/L)  Date Value  07/14/2023 4.0   Magnesium (mg/dL)  Date Value  40/98/1191 1.8   Calcium (mg/dL)  Date Value  47/82/9562 7.6 (L)   Albumin (g/dL)  Date Value  13/03/6577 3.4 (L)   Phosphorus (mg/dL)  Date Value  46/96/2952 2.1 (L)   Sodium (mmol/L)  Date Value  07/14/2023 136     Assessment: 58 y.o. female  with a PMHx of diabetes mellitus type 2, seizure disorder, hypertension, dysrhythmia, and history of ovarian cancer.  Patient presented to the emergency department with difficulty urinating was been admitted for hypoglycemia and AKI now progressed to HD  Goal of Therapy:  Electrolytes WNL  Plan:  ---Kphos neutral 250 mg po x 1 (contains phosphorus 8 mMol, potassium 1.1 mEq) ---recheck electrolytes in am  Lowella Bandy ,PharmD Clinical Pharmacist 07/14/2023 9:08 AM

## 2023-07-14 NOTE — Progress Notes (Signed)
Progress Note   Patient: Sarah Lane WUJ:[id number] DOB: September 24, 1965 DOA: 07/09/2023     5 DOS: the patient was seen and examined on 07/14/2023   Brief hospital course: 57yo with h/o asthma, HFrEF, DM, HTN, and PSVT who presented on 11/29 with acute urinary retention.  She was found to have AKI that was thought to be related to ATN but remained anuric and so was started on HD.  Assessment and Plan:  Acute kidney injury superimposed on stage 3b CKD Stage 3b CKD on admission in 03/2023, bordering on stage 4 Cr 5.85 on admission Creatinine 6.22 >> 6.54 >> 6.58 and still no urine output despite fluids Possibly related to ATN  Renal U/S without obstruction. Nephrology following, starting dialysis Vascular surgery consulted for HD access, s/p Right IJ tunneled dialysis catheter  Underwent HD 12/3, 12/4, planned again for 12/5 Avoid nephrotoxins & hypotension Nephrology is working to establish outpatient HD   Hypoglycemia with NIDDM POA, suspect due to severe AKI CBG was 45 on admission & had recurrent episodes on hospital day 1 Resolved Will stop checking accuchecks Hold metformin   Dyslipidemia On statin   Essential hypertension Hold home Cozaar with renal failure Continue Toprol-XL   Hypokalemia Repleted with resolution Electrolytes per pharmacy   UTI (urinary tract infection) Minimal urine output since admission UA turbid with large leukocytes, few bacteria, > 50 WBC's Urine culture grew Klebsiella pneumoniae resistant only to ampicillin Continue Rocephin to complete course   GERD without esophagitis On PPI   Chronic HFrEF (heart failure with reduced ejection fraction)  Echo on 8/22 with EF 30-35%, grade 1 diastolic dysfunction Volume status stable / dry. Dialysis for volume management Daily weights  CAD R/LHC on 04/06/23 with severe single-vessel CAD with occlusion to vessel too small and distal for PCI Medication management   High anion gap metabolic  acidosis Due to acute renal failure Resolved    Consultants: Nephrology  Procedures: [facility] placement 12/3 HD 12/3, 12/4  Antibiotics: Ceftriaxone 12/1- Cefazolin x 1  30 Day Unplanned Readmission Risk Score    Flowsheet Row ED to Hosp-Admission (Current) from 07/09/2023 in [facility]  30 Day Unplanned Readmission Risk Score (%) 24.55 Filed at 07/14/2023 0801       This score is the patient's risk of an unplanned readmission within 30 days of being discharged (0 -100%). The score is based on dignosis, age, lab data, medications, orders, and past utilization.   Low:  0-14.9   Medium: 15-21.9   High: 22-29.9   Extreme: 30 and above           Subjective: Seen in HD, no complaints.     Objective: Vitals:   07/14/23 0821 07/14/23 0830  BP: 101/69 137/71  Pulse: 78 64  Resp: 16 16  Temp: 97.9 F (36.6 C)   SpO2: 100% 98%    Intake/Output Summary (Last 24 hours) at 07/14/2023 0843 Last data filed at 07/13/2023 2305 Gross per 24 hour  Intake 157.29 ml  Output 0 ml  Net 157.29 ml   Filed Weights   07/09/23 2059 07/14/23 0824  Weight: 61.2 kg 62.8 kg    Exam:  General:  Appears calm and comfortable and is in NAD Eyes:  EOMI, normal lids, iris ENT:  grossly normal hearing, lips & tongue, mmm Neck:  no LAD, masses or thyromegaly Cardiovascular:  RRR. No LE edema.  Respiratory:   CTA bilaterally with no wheezes/rales/rhonchi.  Normal respiratory effort. Abdomen:  soft,  NT, ND Skin:  no rash or induration seen on limited exam Musculoskeletal:  grossly normal tone BUE/BLE, good ROM, no bony abnormality Psychiatric:  blunted mood and affect, speech appropriate but with some ?hesitation, AOx3 Neurologic:  CN 2-12 grossly intact, moves all extremities in coordinated fashion  Data Reviewed: I have reviewed the patient's lab results since admission.  Pertinent labs for today include:   Glucose 113 BUN 29/Creatinine 3.59/GFR  14 Phos 2.1 Albumin 3.4 Unremarkable CBC    Family Communication: None present; I left a message for her sister by telephone  Disposition: Status is: Inpatient Remains inpatient appropriate because: ongoing management     Time spent: 35 minutes  Unresulted Labs (From admission, onward)     Start     Ordered   07/14/23 0500  ANA w/Reflex if Positive  Tomorrow morning,   R       Question:  Specimen collection method  Answer:  Lab=Lab collect   07/13/23 1441   07/14/23 0500  ANCA Profile  Tomorrow morning,   R       Question:  Specimen collection method  Answer:  Lab=Lab collect   07/13/23 1441   07/14/23 0500  C3 complement  Tomorrow morning,   R       Question:  Specimen collection method  Answer:  Lab=Lab collect   07/13/23 1441   07/14/23 0500  C4 complement  Tomorrow morning,   R       Question:  Specimen collection method  Answer:  Lab=Lab collect   07/13/23 1441   07/14/23 0500  Protein electrophoresis, serum  Tomorrow morning,   R       Question:  Specimen collection method  Answer:  Lab=Lab collect   07/13/23 1441   07/14/23 0500  Glomerular basement membrane antibodies  Tomorrow morning,   R       Question:  Specimen collection method  Answer:  Lab=Lab collect   07/13/23 1441   07/14/23 0500  Kappa/lambda light chains  Tomorrow morning,   R       Question:  Specimen collection method  Answer:  Lab=Lab collect   07/13/23 1441   07/13/23 1441  Protein Electro, Random Urine  Once,   R        07/13/23 1441   07/12/23 0500  Renal function panel  Daily,   R      07/11/23 1459             Author: Jonah Blue, MD 07/14/2023 8:43 AM  For on call review [email or web address].

## 2023-07-14 NOTE — Plan of Care (Signed)

## 2023-07-14 NOTE — Hospital Course (Signed)
57yo with h/o asthma, HFrEF, DM, HTN, and PSVT who presented on 11/29 with acute urinary retention.  She was found to have AKI that was thought to be related to ATN but remained anuric and so was started on HD.

## 2023-07-15 DIAGNOSIS — N179 Acute kidney failure, unspecified: Secondary | ICD-10-CM | POA: Diagnosis not present

## 2023-07-15 DIAGNOSIS — N189 Chronic kidney disease, unspecified: Secondary | ICD-10-CM | POA: Diagnosis not present

## 2023-07-15 LAB — KAPPA/LAMBDA LIGHT CHAINS
Kappa free light chain: 29.2 mg/L — ABNORMAL HIGH (ref 3.3–19.4)
Kappa, lambda light chain ratio: 1.09 (ref 0.26–1.65)
Lambda free light chains: 26.7 mg/L — ABNORMAL HIGH (ref 5.7–26.3)

## 2023-07-15 LAB — RENAL FUNCTION PANEL
Albumin: 3.1 g/dL — ABNORMAL LOW (ref 3.5–5.0)
Anion gap: 11 (ref 5–15)
BUN: 17 mg/dL (ref 6–20)
CO2: 26 mmol/L (ref 22–32)
Calcium: 8.2 mg/dL — ABNORMAL LOW (ref 8.9–10.3)
Chloride: 98 mmol/L (ref 98–111)
Creatinine, Ser: 2.31 mg/dL — ABNORMAL HIGH (ref 0.44–1.00)
GFR, Estimated: 24 mL/min — ABNORMAL LOW (ref >60)
Glucose, Bld: 92 mg/dL (ref 70–99)
Phosphorus: 2.3 mg/dL — ABNORMAL LOW (ref 2.5–4.6)
Potassium: 3.3 mmol/L — ABNORMAL LOW (ref 3.5–5.1)
Sodium: 135 mmol/L (ref 135–145)

## 2023-07-15 LAB — CBC
HCT: 36.1 % (ref 36.0–46.0)
Hemoglobin: 12.3 g/dL (ref 12.0–15.0)
MCH: 29.8 pg (ref 26.0–34.0)
MCHC: 34.1 g/dL (ref 30.0–36.0)
MCV: 87.4 fL (ref 80.0–100.0)
Platelets: 175 10*3/uL (ref 150–400)
RBC: 4.13 MIL/uL (ref 3.87–5.11)
RDW: 12.9 % (ref 11.5–15.5)
WBC: 10 10*3/uL (ref 4.0–10.5)
nRBC: 0 % (ref 0.0–0.2)

## 2023-07-15 LAB — GLOMERULAR BASEMENT MEMBRANE ANTIBODIES: GBM Ab: <0.2 U (ref 0.0–0.9)

## 2023-07-15 LAB — C4 COMPLEMENT: Complement C4, Body Fluid: 37 mg/dL (ref 12–38)

## 2023-07-15 LAB — ANA W/REFLEX IF POSITIVE: Anti Nuclear Antibody (ANA): NEGATIVE

## 2023-07-15 LAB — C3 COMPLEMENT: C3 Complement: 96 mg/dL (ref 82–167)

## 2023-07-15 LAB — ANCA PROFILE
Anti-MPO Antibodies: <0.2 U (ref 0.0–0.9)
Anti-PR3 Antibodies: <0.2 U (ref 0.0–0.9)
Atypical P-ANCA titer: <1:20 {titer}
C-ANCA: <1:20 {titer}
P-ANCA: <1:20 {titer}

## 2023-07-15 LAB — MAGNESIUM: Magnesium: 1.3 mg/dL — ABNORMAL LOW (ref 1.7–2.4)

## 2023-07-15 MED ORDER — ONDANSETRON HCL 4 MG/2ML IJ SOLN
INTRAMUSCULAR | Status: AC
  Filled 2023-07-15: qty 2

## 2023-07-15 MED ORDER — MAGNESIUM SULFATE 2 GM/50ML IV SOLN
2.0000 g | Freq: Once | INTRAVENOUS | Status: AC
  Administered 2023-07-15: 2 g via INTRAVENOUS
  Filled 2023-07-15: qty 50

## 2023-07-15 MED ORDER — POTASSIUM CHLORIDE 10 MEQ/100ML IV SOLN
10.0000 meq | INTRAVENOUS | Status: AC
  Administered 2023-07-15 (×2): 10 meq via INTRAVENOUS
  Filled 2023-07-15 (×2): qty 100

## 2023-07-15 NOTE — Progress Notes (Signed)
Progress Note   Patient: Sarah Lane IEP:[id number] DOB: 05/29/1966 DOA: 07/09/2023     6 DOS: the patient was seen and examined on 07/15/2023   Brief hospital course: 57yo with h/o asthma, HFrEF, DM, HTN, and PSVT who presented on 11/29 with acute urinary retention.  She was found to have AKI that was thought to be related to ATN but remained anuric and so was started on HD.  Assessment and Plan:  New onset ESRD Stage 3b CKD on admission in 03/2023, bordering on stage 4 Cr 5.85 on admission this time with anuria not responsive to IVF Possibly related to ATN  Renal U/S without obstruction. Nephrology following, starting dialysis Vascular surgery consulted for HD access, s/p Right IJ tunneled dialysis catheter  Underwent HD 12/3, 12/4, 12/5 Avoid nephrotoxins & hypotension Nephrology is working to establish outpatient HD - will start Tuesday, 12/10 Current plan is likely dc after HD on 12/7   Hypoglycemia with NIDDM POA, suspect due to severe AKI CBG was 45 on admission & had recurrent episodes on hospital day 1 Resolved Hold metformin   Dyslipidemia On statin   Essential hypertension Hold home Cozaar with renal failure Continue Toprol-XL   Hypokalemia Repleted with resolution Electrolytes per pharmacy   UTI (urinary tract infection) Minimal urine output since admission UA turbid with large leukocytes, few bacteria, > 50 WBC's Urine culture grew Klebsiella pneumoniae resistant only to ampicillin Continue Rocephin to complete course   GERD without esophagitis On PPI   Chronic HFrEF (heart failure with reduced ejection fraction)  Echo on 8/22 with EF 30-35%, grade 1 diastolic dysfunction Volume status stable / dry. Dialysis for volume management Daily weights   CAD R/LHC on 04/06/23 with severe single-vessel CAD with occlusion to vessel too small and distal for PCI Medication management   High anion gap metabolic acidosis Due to acute renal failure Resolved        Consultants: Nephrology   Procedures: [facility] placement 12/3 HD 12/3, 12/4   Antibiotics: Ceftriaxone 12/1- Cefazolin x 1  30 Day Unplanned Readmission Risk Score    Flowsheet Row ED to Hosp-Admission (Current) from 07/09/2023 in [facility]  30 Day Unplanned Readmission Risk Score (%) 22.51 Filed at 07/15/2023 0801       This score is the patient's risk of an unplanned readmission within 30 days of being discharged (0 -100%). The score is based on dignosis, age, lab data, medications, orders, and past utilization.   Low:  0-14.9   Medium: 15-21.9   High: 22-29.9   Extreme: 30 and above           Subjective: Seen in HD.  Felt bad and was found to have hypotension during her session today.   Objective: Vitals:   07/15/23 0832 07/15/23 0848  BP: 115/84 137/72  Pulse: 83 82  Resp: 17 18  Temp: 98 F (36.7 C)   SpO2: 97% 100%    Intake/Output Summary (Last 24 hours) at 07/15/2023 0854 Last data filed at 07/15/2023 0400 Gross per 24 hour  Intake --  Output 1150 ml  Net -1150 ml   Filed Weights   07/14/23 0824 07/14/23 1120 07/15/23 0824  Weight: 62.8 kg 61.9 kg 61.9 kg    Exam:  General:  Appears calm and comfortable and is in NAD Eyes:  EOMI, normal lids, iris ENT:  grossly normal hearing, lips & tongue, mmm Neck:  no LAD, masses or thyromegaly Cardiovascular:  RRR. No LE edema.  Respiratory:   CTA bilaterally with no wheezes/rales/rhonchi.  Normal respiratory effort. Abdomen:  soft, NT, ND Skin:  no rash or induration seen on limited exam Musculoskeletal:  grossly normal tone BUE/BLE, good ROM, no bony abnormality Psychiatric:  blunted mood and affect, speech appropriate but with some ?hesitation, AOx3 Neurologic:  CN 2-12 grossly intact, moves all extremities in coordinated fashion  Data Reviewed: I have reviewed the patient's lab results since admission.  Pertinent labs for today include:   K+ 3.3 BUN  17/Creatinine 2.31/GFR 24 Phos 2.3 Mag++ 1.3 Albumin 3.1     Family Communication: None present; I spoke with her sister by telephone  Disposition: Status is: Inpatient Remains inpatient appropriate because: new onset dialysis     Time spent: 35 minutes  Unresulted Labs (From admission, onward)     Start     Ordered   07/15/23 0843  CBC  ONCE - STAT,   STAT       Question:  Specimen collection method  Answer:  Lab=Lab collect   07/15/23 0842   07/14/23 0500  ANA w/Reflex if Positive  Tomorrow morning,   R       Question:  Specimen collection method  Answer:  Lab=Lab collect   07/13/23 1441   07/14/23 0500  ANCA Profile  Tomorrow morning,   R       Question:  Specimen collection method  Answer:  Lab=Lab collect   07/13/23 1441   07/14/23 0500  C3 complement  Tomorrow morning,   R       Question:  Specimen collection method  Answer:  Lab=Lab collect   07/13/23 1441   07/14/23 0500  C4 complement  Tomorrow morning,   R       Question:  Specimen collection method  Answer:  Lab=Lab collect   07/13/23 1441   07/14/23 0500  Protein electrophoresis, serum  Tomorrow morning,   R       Question:  Specimen collection method  Answer:  Lab=Lab collect   07/13/23 1441   07/14/23 0500  Glomerular basement membrane antibodies  Tomorrow morning,   R       Question:  Specimen collection method  Answer:  Lab=Lab collect   07/13/23 1441   07/14/23 0500  Kappa/lambda light chains  Tomorrow morning,   R       Question:  Specimen collection method  Answer:  Lab=Lab collect   07/13/23 1441   07/13/23 1441  Protein Electro, Random Urine  Once,   R        07/13/23 1441   07/12/23 0500  Renal function panel  Daily,   R      07/11/23 1459             Author: Jonah Blue, MD 07/15/2023 8:54 AM  For on call review [email or web address].

## 2023-07-15 NOTE — Progress Notes (Signed)
1015: Patient complained of nausea and feeling bad. Patient had an episode of blank stare for a few seconds. Rinsed back patient's blood and notified HD NP  and ordered to resume HD with UF off. Zofran was given for nausea and vomiting. Patient is alert and oriented afterwards.   1020: Repeat BP: 127/82

## 2023-07-15 NOTE — Progress Notes (Signed)
  Progress Note    07/15/2023 11:56 AM 2 Days Post-Op  Subjective:  Sarah Lane  is a 57 y.o. female with a PMHx of diabetes mellitus type 2, seizure disorder, hypertension, dysrhythmia, and history of ovarian cancer.  Patient presents to the emergency department with difficulty urinating and has been admitted for Hypoglycemia, Acute renal failure, acute kidney injury superimposed on chronic kidney disease.  Patient is now postop day 2 from [facility] hemodialysis catheter placement in her right chest.  Patient endorses some soreness to the site but no other complaints and vitals are remained stable.   Vitals:   07/15/23 1100 07/15/23 1130  BP: 107/69 104/64  Pulse: 79 83  Resp: 18 17  Temp:    SpO2: 97% 97%   Physical Exam: Cardiac:  RRR, normal S1-S2.  No murmurs. Lungs: Clear on auscultation throughout.  No rales rhonchi or wheezing noted. Incisions: Right chest incision with dialysis permacath hemodialysis catheter access placement.  Dressings clean dry and intact no hematoma seroma to note. Extremities: Palpable pulses throughout. Abdomen: Positive bowel sounds throughout, soft, nontender nondistended. Neurologic: Alert and oriented x 3, answers all questions and follows commands appropriately.  CBC    Component Value Date/Time   WBC 10.0 07/15/2023 0843   RBC 4.13 07/15/2023 0843   HGB 12.3 07/15/2023 0843   HCT 36.1 07/15/2023 0843   PLT 175 07/15/2023 0843   MCV 87.4 07/15/2023 0843   MCH 29.8 07/15/2023 0843   MCHC 34.1 07/15/2023 0843   RDW 12.9 07/15/2023 0843   LYMPHSABS 3.5 07/09/2023 2115   MONOABS 0.9 07/09/2023 2115   EOSABS 0.3 07/09/2023 2115   BASOSABS 0.1 07/09/2023 2115    BMET    Component Value Date/Time   NA 135 07/15/2023 0559   K 3.3 (L) 07/15/2023 0559   CL 98 07/15/2023 0559   CO2 26 07/15/2023 0559   GLUCOSE 92 07/15/2023 0559   BUN 17 07/15/2023 0559   CREATININE 2.31 (H) 07/15/2023 0559   CALCIUM 8.2 (L) 07/15/2023 0559    GFRNONAA 24 (L) 07/15/2023 0559   GFRAA >60 01/02/2015 0434    INR No results found for: "INR"   Intake/Output Summary (Last 24 hours) at 07/15/2023 1156 Last data filed at 07/15/2023 0400 Gross per 24 hour  Intake --  Output 150 ml  Net -150 ml     Assessment/Plan:  57 y.o. female is s/p permacatheter hemodialysis catheter placement 2 Days Post-Op   PLAN: Patient is recovering as expected from a permacatheter hemoglobin also is access placement to her right chest.  No complications to note.  Access is working well for dialysis.  Vascular surgery will sign off at this time.  DVT prophylaxis: Heparin with dialysis.   Marcie Bal [facility] 07/15/2023 11:56 AM

## 2023-07-15 NOTE — Progress Notes (Signed)
PHARMACY CONSULT NOTE - FOLLOW UP  Pharmacy Consult for Electrolyte Monitoring and Replacement   Recent Labs: Potassium (mmol/L)  Date Value  07/15/2023 3.3 (L)   Magnesium (mg/dL)  Date Value  69/62/9528 1.3 (L)   Calcium (mg/dL)  Date Value  41/32/4401 8.2 (L)   Albumin (g/dL)  Date Value  02/72/5366 3.1 (L)   Phosphorus (mg/dL)  Date Value  44/10/4740 2.3 (L)   Sodium (mmol/L)  Date Value  07/15/2023 135   Corrected Ca: 8.9 mg/dL  Assessment: 58 y.o. female  with a PMHx of diabetes mellitus type 2, seizure disorder, hypertension, dysrhythmia, and history of ovarian cancer.  Patient presented to the emergency department with difficulty urinating was been admitted for hypoglycemia and AKI now progressed to HD  Goal of Therapy:  Electrolytes WNL  Plan:  ---2 grams IV magnesium sulfate x 1 ---10 mEq IV KCl x 2 ---recheck electrolytes in am  Lowella Bandy ,PharmD Clinical Pharmacist 07/15/2023 12:28 PM

## 2023-07-15 NOTE — Progress Notes (Signed)
[facility]  ROUNDING NOTE   Subjective:   Sarah Lane  is a 57 y.o. female with a PMHx of diabetes mellitus type 2, seizure disorder, hypertension, dysrhythmia, and history of ovarian cancer.  Patient presents to the emergency department with difficulty urinating and has been admitted for Hypoglycemia [E16.2] Acute renal failure, unspecified acute renal failure type (HCC) [N17.9] Acute kidney injury superimposed on chronic kidney disease (HCC) [N17.9, N18.9]  Patient is known to our practice from previous admission.   Update Patient seen and evaluated during dialysis   HEMODIALYSIS FLOWSHEET:  Blood Flow Rate (mL/min): 200 mL/min Arterial Pressure (mmHg): -97.57 mmHg Venous Pressure (mmHg): 57.98 mmHg TMP (mmHg): 8.68 mmHg Ultrafiltration Rate (mL/min): 258 mL/min Dialysate Flow Rate (mL/min): 300 ml/min Bolus Amount (mL): 100 mL  Tolerating treatment initially,  BP dropped and patient vomiting  Objective:  Vital signs in last 24 hours:  Temp:  [97.4 F (36.3 C)-98.4 F (36.9 C)] 97.9 F (36.6 C) (12/05 1220) Pulse Rate:  [74-112] 87 (12/05 1220) Resp:  [15-26] 19 (12/05 1220) BP: (59-137)/(34-85) 115/70 (12/05 1220) SpO2:  [94 %-100 %] 99 % (12/05 1220) Weight:  [61.9 kg] 61.9 kg (12/05 1231)  Weight change:  Filed Weights   07/14/23 1120 07/15/23 0824 07/15/23 1231  Weight: 61.9 kg 61.9 kg 61.9 kg    Intake/Output: I/O last 3 completed shifts: In: -  Out: 1150 [Urine:150; Other:1000]   Intake/Output this shift:  No intake/output data recorded.  Physical Exam: General: NAD  Head: Normocephalic, atraumatic.  Dry oral mucosal membranes  Eyes: Anicteric  Lungs:  Clear to auscultation, normal effort  Heart: Regular rate and rhythm  Abdomen:  Soft, nontender, nondistended  Extremities: No peripheral edema.  Neurologic: Alert and oriented, moving all four extremities  Skin: No lesions  Access: Rt chest permcath    Basic Metabolic  Panel: Recent Labs  Lab 07/11/23 0857 07/12/23 0217 07/13/23 0517 07/14/23 0622 07/15/23 0559  NA 131* 132* 137 136 135  K 3.2* 3.0* 3.2* 4.0 3.3*  CL 84* 86* 92* 95* 98  CO2 29 31 31 28 26   GLUCOSE 119* 140* 107* 113* 92  BUN 68* 66* 59* 29* 17  CREATININE 6.54* 6.58* 6.34* 3.59* 2.31*  CALCIUM 8.1* 7.8* 7.6* 7.6* 8.2*  MG  --   --   --   --  1.3*  PHOS 2.9 2.6 2.2* 2.1* 2.3*    Liver Function Tests: Recent Labs  Lab 07/09/23 2115 07/11/23 0857 07/12/23 0217 07/13/23 0517 07/14/23 0622 07/15/23 0559  AST 26  --   --   --   --   --   ALT 14  --   --   --   --   --   ALKPHOS 52  --   --   --   --   --   BILITOT 1.3*  --   --   --   --   --   PROT 7.8  --   --   --   --   --   ALBUMIN 4.5 3.2* 3.1* 3.1* 3.4* 3.1*   Recent Labs  Lab 07/09/23 2115  LIPASE 120*   No results for input(s): "AMMONIA" in the last 168 hours.  CBC: Recent Labs  Lab 07/09/23 2115 07/10/23 0415 07/11/23 0857 07/14/23 0622 07/15/23 0843  WBC 14.8* 10.7* 9.5 8.7 10.0  NEUTROABS 9.9*  --   --   --   --   HGB 13.8 11.5* 11.2* 12.1 12.3  HCT 41.7 34.3* 31.1* 34.6* 36.1  MCV 90.3 89.3 83.2 85.6 87.4  PLT 296 202 184 175 175    Cardiac Enzymes: No results for input(s): "CKTOTAL", "CKMB", "CKMBINDEX", "TROPONINI" in the last 168 hours.  BNP: Invalid input(s): "POCBNP"  CBG: Recent Labs  Lab 07/13/23 1629 07/13/23 2121 07/14/23 0831 07/14/23 1150 07/14/23 1601  GLUCAP 121* 115* 90 88 105*    Microbiology: Results for orders placed or performed during the hospital encounter of 07/09/23  Urine Culture (for pregnant, neutropenic or urologic patients or patients with an indwelling urinary catheter)     Status: Abnormal   Collection Time: 07/11/23  5:30 AM   Specimen: Urine, Clean Catch  Result Value Ref Range Status   Specimen Description   Final    URINE, CLEAN CATCH Performed at [facility], [address] [place], [place] [address]    Special Requests   Final     NONE Performed at [facility], [address] [place], [place] [address]    Culture >=100,000 COLONIES/mL KLEBSIELLA PNEUMONIAE (A)  Final   Report Status 07/13/2023 FINAL  Final   Organism ID, Bacteria KLEBSIELLA PNEUMONIAE (A)  Final      Susceptibility   Klebsiella pneumoniae - MIC*    AMPICILLIN RESISTANT Resistant     CEFAZOLIN <=4 SENSITIVE Sensitive     CEFEPIME <=0.12 SENSITIVE Sensitive     CEFTRIAXONE <=0.25 SENSITIVE Sensitive     CIPROFLOXACIN <=0.25 SENSITIVE Sensitive     GENTAMICIN <=1 SENSITIVE Sensitive     IMIPENEM <=0.25 SENSITIVE Sensitive     NITROFURANTOIN 32 SENSITIVE Sensitive     TRIMETH/SULFA <=20 SENSITIVE Sensitive     AMPICILLIN/SULBACTAM 4 SENSITIVE Sensitive     PIP/TAZO <=4 SENSITIVE Sensitive ug/mL    * >=100,000 COLONIES/mL KLEBSIELLA PNEUMONIAE    Coagulation Studies: No results for input(s): "LABPROT", "INR" in the last 72 hours.  Urinalysis: No results for input(s): "COLORURINE", "LABSPEC", "PHURINE", "GLUCOSEU", "HGBUR", "BILIRUBINUR", "KETONESUR", "PROTEINUR", "UROBILINOGEN", "NITRITE", "LEUKOCYTESUR" in the last 72 hours.  Invalid input(s): "APPERANCEUR"     Imaging: DG Chest 1 View  Result Date: 07/14/2023 CLINICAL DATA:  End-stage renal disease EXAM: CHEST  1 VIEW COMPARISON:  03/31/2023, chest CT report 12/16/2019 FINDINGS: Right-sided central venous catheter tips at the SVC. 15 mm right lower lobe pulmonary nodule as seen on multiple prior exams. No acute airspace disease. Stable cardiomediastinal silhouette. No pneumothorax IMPRESSION: 1. No active disease. 2. 15 mm right lower lobe pulmonary nodule as seen on multiple prior exams. Electronically Signed   By: Jasmine Pang M.D.   On: 07/14/2023 21:06   PERIPHERAL VASCULAR CATHETERIZATION  Result Date: 07/13/2023 See surgical note for result.    Medications:    cefTRIAXone (ROCEPHIN)  IV 1 g (07/15/23 1328)    aspirin  81 mg Oral Daily   atorvastatin  80 mg  Oral Daily   Chlorhexidine Gluconate Cloth  6 each Topical Q0600   heparin injection (subcutaneous)  5,000 Units Subcutaneous Q12H   metoprolol succinate  12.5 mg Oral Daily   pantoprazole  40 mg Oral Daily   phosphorus  250 mg Oral Once   sodium chloride flush  3 mL Intravenous Q12H   acetaminophen **OR** acetaminophen, magnesium hydroxide, ondansetron **OR** ondansetron (ZOFRAN) IV, sodium chloride flush, traZODone  Assessment/ Plan:  Ms. SWAN WERNE is a 57 y.o.  female  is a 57 y.o. female with a PMHx of diabetes mellitus type 2, seizure disorder, hypertension, dysrhythmia, and history of  ovarian cancer.  Patient presents to the emergency department complaining of difficulty with urination.  Patient has been admitted for Hypoglycemia [E16.2] Acute renal failure, unspecified acute renal failure type (HCC) [N17.9] Acute kidney injury superimposed on chronic kidney disease (HCC) [N17.9, N18.9]   Acute Kidney Injury on chronic kidney disease stage IIIa with baseline creatinine 1.27 and GFR of 49 on 04/07/23.  Acute kidney injury resulting in ATN secondary to volume depletion and continued ACE/ARB use. CT abdomen pelvis showed bilateral nonobstructing cyst.  Renal ultrasound confirms no obstruction and presence of simple renal cyst.  Also questionable for cystitis.  Will order serology workup to evaluate any underlying cause.  Dialysis initiated, 07/13/2023.    Patient received third treatment today, seated in chair. Patient became hypotensive and unresponsive. Given bolus and rinsed back. Patient mentation recovered along with blood pressure. Completed dialysis treatment with UF off. Antiemetic given. Next treatment scheduled for Saturday.  Appreciate renal navigator confirming outpatient dialysis clinic at [facility] on a TTS schedule, first treatment scheduled for Tuesday at 1130. If stable, patient cleared to discharge after dialysis on Saturday.   Lab Results  Component Value Date    CREATININE 2.31 (H) 07/15/2023   CREATININE 3.59 (H) 07/14/2023   CREATININE 6.34 (H) 07/13/2023    Intake/Output Summary (Last 24 hours) at 07/15/2023 1338 Last data filed at 07/15/2023 1220 Gross per 24 hour  Intake --  Output 150 ml  Net -150 ml   2.  Hyponatremia/hyperkalemia.  Potassium 5.2 on admission, corrected to 4.1 after shifting measures.  Sodium 131 on admission.  Likely due to poor oral intake in combination with kidney injury.    Will manage electrolytes with dialysis.  3.  Acute metabolic acidosis.  Serum bicarb corrected  4. Anemia of chronic kidney disease Lab Results  Component Value Date   HGB 12.3 07/15/2023    Will continue to monitor for now.  5. Diabetes mellitus type II with chronic kidney disease: noninsulin dependent. Home regimen includes metformin. Most recent hemoglobin A1c is 8.2 on 03/31/23.   Glucose well-controlled.Primary team to manage SSI    LOS: 6 Finlee Milo 12/5/20241:38 PM

## 2023-07-15 NOTE — TOC Progression Note (Signed)
Transition of Care [facility]) - Progression Note    Patient Details  Name: Sarah Lane MRN: [id number] Date of Birth: 1966/01/25  Transition of Care [facility]) CM/SW Contact  Chapman Fitch, RN Phone Number: 07/15/2023, 4:48 PM  Clinical Narrative:     Chest xray faxed to [facility] at [phone]  Expected Discharge Plan: Home/Self Care Barriers to Discharge: Continued Medical Work up  Expected Discharge Plan and Services       Living arrangements for the past 2 months: Single Family Home                                       Social Determinants of Health (SDOH) Interventions SDOH Screenings   Food Insecurity: No Food Insecurity (07/10/2023)  Housing: Medium Risk (07/10/2023)  Transportation Needs: Unmet Transportation Needs (07/10/2023)  Utilities: Not At Risk (07/10/2023)  Financial Resource Strain: Medium Risk (05/17/2023)   Received from [facility]  Physical Activity: Insufficiently Active (05/17/2023)   Received from [facility]  Social Connections: Socially Isolated (05/18/2023)   Received from [facility]  Stress: No Stress Concern Present (05/18/2023)   Received from [facility]  Tobacco Use: Unknown (07/09/2023)  Health Literacy: Medium Risk (05/17/2023)   Received from [facility]    Readmission Risk Interventions    07/11/2023   11:51 AM  Readmission Risk Prevention Plan  Transportation Screening Complete  PCP or Specialist Appt within 5-7 Days Complete  Home Care Screening Complete  Medication Review (RN CM) Complete

## 2023-07-15 NOTE — Progress Notes (Signed)
  Received patient in bed to unit.   Informed consent signed and in chart.    TX duration: 3hrs     Transported back to floor  Hand-off given to patient's nurse. No c/o and no distress noted    Access used: R HD catheter  Access issues: none    Total UF removed: 0L Medication(s) given: zofran  Post HD weight: 61.9kgs     Lynann Beaver  K[facility] D[facility] Unit

## 2023-07-16 DIAGNOSIS — E43 Unspecified severe protein-calorie malnutrition: Secondary | ICD-10-CM | POA: Insufficient documentation

## 2023-07-16 DIAGNOSIS — N179 Acute kidney failure, unspecified: Secondary | ICD-10-CM | POA: Diagnosis not present

## 2023-07-16 DIAGNOSIS — N189 Chronic kidney disease, unspecified: Secondary | ICD-10-CM | POA: Diagnosis not present

## 2023-07-16 LAB — RENAL FUNCTION PANEL
Albumin: 3.1 g/dL — ABNORMAL LOW (ref 3.5–5.0)
Anion gap: 11 (ref 5–15)
BUN: 10 mg/dL (ref 6–20)
CO2: 24 mmol/L (ref 22–32)
Calcium: 8.6 mg/dL — ABNORMAL LOW (ref 8.9–10.3)
Chloride: 99 mmol/L (ref 98–111)
Creatinine, Ser: 1.63 mg/dL — ABNORMAL HIGH (ref 0.44–1.00)
GFR, Estimated: 36 mL/min — ABNORMAL LOW (ref >60)
Glucose, Bld: 83 mg/dL (ref 70–99)
Phosphorus: 1.8 mg/dL — ABNORMAL LOW (ref 2.5–4.6)
Potassium: 3.4 mmol/L — ABNORMAL LOW (ref 3.5–5.1)
Sodium: 134 mmol/L — ABNORMAL LOW (ref 135–145)

## 2023-07-16 LAB — MAGNESIUM: Magnesium: 2 mg/dL (ref 1.7–2.4)

## 2023-07-16 MED ORDER — RENA-VITE PO TABS
1.0000 | ORAL_TABLET | Freq: Every day | ORAL | Status: DC
  Administered 2023-07-16: 1 via ORAL
  Filled 2023-07-16: qty 1

## 2023-07-16 MED ORDER — ENSURE ENLIVE PO LIQD: 237.0000 mL | Freq: Two times a day (BID) | ORAL | Status: DC

## 2023-07-16 NOTE — Progress Notes (Signed)
Progress Note   Patient: Sarah Lane QIO:[id number] DOB: 08/28/1965 DOA: 07/09/2023     7 DOS: the patient was seen and examined on 07/16/2023   Brief hospital course: 57yo with h/o asthma, HFrEF, DM, HTN, and PSVT who presented on 11/29 with acute urinary retention.  She was found to have AKI that was thought to be related to ATN but remained anuric and so was started on HD.  Assessment and Plan:  New onset ESRD Stage 3b CKD on admission in 03/2023, bordering on stage 4 Cr 5.85 on admission this time with anuria not responsive to IVF Possibly related to ATN  Renal U/S without obstruction. Nephrology following, starting dialysis Vascular surgery consulted for HD access, s/p Right IJ tunneled dialysis catheter  Underwent HD 12/3, 12/4, 12/5 Avoid nephrotoxins & hypotension Nephrology is working to establish outpatient HD - will start Tuesday, 12/10 Current plan is likely dc after HD on 12/7   Hypoglycemia with NIDDM POA, suspect due to severe AKI CBG was 45 on admission & had recurrent episodes on hospital day 1 Resolved Recent A1c was 5.3 Hold metformin as inpatient and on an ongoing basis for now   Dyslipidemia On statin   Essential hypertension Hold home Cozaar with renal failure Continue Toprol-XL   Hypokalemia Repleted with resolution Electrolytes per pharmacy   UTI (urinary tract infection) Minimal urine output since admission UA turbid with large leukocytes, few bacteria, > 50 WBC's Urine culture grew Klebsiella pneumoniae resistant only to ampicillin Completed Ceftriaxone   GERD without esophagitis On PPI   Chronic HFrEF (heart failure with reduced ejection fraction)  Echo on 8/22 with EF 30-35%, grade 1 diastolic dysfunction Volume status stable / dry. Dialysis for volume management Daily weights   CAD R/LHC on 04/06/23 with severe single-vessel CAD with occlusion to vessel too small and distal for PCI Medication management   High anion gap metabolic  acidosis Due to acute renal failure Resolved       Consultants: Nephrology   Procedures: [facility] placement 12/3 HD 12/3, 12/4   Antibiotics: Ceftriaxone 12/1-5 Cefazolin x 1    30 Day Unplanned Readmission Risk Score    Flowsheet Row ED to Hosp-Admission (Current) from 07/09/2023 in [facility]  30 Day Unplanned Readmission Risk Score (%) 22.38 Filed at 07/16/2023 0801       This score is the patient's risk of an unplanned readmission within 30 days of being discharged (0 -100%). The score is based on dignosis, age, lab data, medications, orders, and past utilization.   Low:  0-14.9   Medium: 15-21.9   High: 22-29.9   Extreme: 30 and above           Subjective: Feeling ok, understands the plan for dc.   Objective: Vitals:   07/16/23 0353 07/16/23 0757  BP: 126/73 120/69  Pulse: 76 74  Resp: 18 18  Temp: (!) 97.4 F (36.3 C) (!) 97.5 F (36.4 C)  SpO2: 98% 98%   No intake or output data in the 24 hours ending 07/16/23 1736  Filed Weights   07/15/23 0824 07/15/23 1231 07/16/23 1626  Weight: 61.9 kg 61.9 kg 63.4 kg    Exam:  General:  Appears calm and comfortable and is in NAD Eyes:  EOMI, normal lids, iris ENT:  grossly normal hearing, lips & tongue, mmm Neck:  no LAD, masses or thyromegaly Cardiovascular:  RRR. No LE edema.  Respiratory:   CTA bilaterally with no wheezes/rales/rhonchi.  Normal respiratory effort.  Abdomen:  soft, NT, ND Skin:  no rash or induration seen on limited exam Musculoskeletal:  grossly normal tone BUE/BLE, good ROM, no bony abnormality Psychiatric:  blunted mood and affect, speech appropriate but with some hesitation, AOx3 Neurologic:  CN 2-12 grossly intact, moves all extremities in coordinated fashion  Data Reviewed: I have reviewed the patient's lab results since admission.  Pertinent labs for today include:   Na++ 134 K+ 3.4 BUN 10/Creatinine 1.62/GFR 36 Phos 1.8 Albumin 3.1      Family Communication: None present  Disposition: Status is: Inpatient Remains inpatient appropriate because: needs HD tomorrow and then dc     Time spent: 35 minutes  Unresulted Labs (From admission, onward)     Start     Ordered   07/17/23 0500  Basic metabolic panel  Daily,   R     Question:  Specimen collection method  Answer:  Lab=Lab collect   07/16/23 1642   07/17/23 0500  Magnesium  Daily,   R     Question:  Specimen collection method  Answer:  Lab=Lab collect   07/16/23 1642   07/17/23 0500  Phosphorus  Daily,   R     Question:  Specimen collection method  Answer:  Lab=Lab collect   07/16/23 1642   07/14/23 0500  Protein electrophoresis, serum  Tomorrow morning,   R       Question:  Specimen collection method  Answer:  Lab=Lab collect   07/13/23 1441   07/13/23 1441  Protein Electro, Random Urine  Once,   R        07/13/23 1441             Author: Jonah Blue, MD 07/16/2023 5:36 PM  For on call review [email or web address].

## 2023-07-16 NOTE — TOC Progression Note (Signed)
Transition of Care [facility]) - Progression Note    Patient Details  Name: Sarah Lane MRN: [id number] Date of Birth: 02/22/1966  Transition of Care [facility]) CM/SW Contact  Liliana Cline, LCSW Phone Number: 07/16/2023, 3:30 PM  Clinical Narrative:    Met with patient at bedside. Assisted patient with contacting [employer] [phone]) for transportation to and from dialysis through her Medicaid.  Patient was able to arrange rides for next week Tuesday, Thursday, and Saturday.  Patient to DC home tomorrow after her HD treatment. Patient will let staff know if she needs a ride home. Home address is [address], [place], [place] [address]. Patient states if she cannot find a ride, she will need a taxi and feels comfortable taking a taxi. However she cannot afford to pay for one at this time. Cab voucher can be provided if needed.    Expected Discharge Plan: Home/Self Care Barriers to Discharge: Continued Medical Work up  Expected Discharge Plan and Services       Living arrangements for the past 2 months: Single Family Home                                       Social Determinants of Health (SDOH) Interventions SDOH Screenings   Food Insecurity: No Food Insecurity (07/10/2023)  Housing: Medium Risk (07/10/2023)  Transportation Needs: Unmet Transportation Needs (07/10/2023)  Utilities: Not At Risk (07/10/2023)  Financial Resource Strain: Medium Risk (05/17/2023)   Received from [facility]  Physical Activity: Insufficiently Active (05/17/2023)   Received from [facility]  Social Connections: Socially Isolated (05/18/2023)   Received from [facility]  Stress: No Stress Concern Present (05/18/2023)   Received from [facility]  Tobacco Use: Unknown (07/09/2023)  Health Literacy: Medium Risk (05/17/2023)   Received from [facility]    Readmission Risk Interventions    07/11/2023   11:51 AM  Readmission Risk Prevention Plan  Transportation  Screening Complete  PCP or Specialist Appt within 5-7 Days Complete  Home Care Screening Complete  Medication Review (RN CM) Complete

## 2023-07-16 NOTE — Progress Notes (Signed)
[facility]  ROUNDING NOTE   Subjective:   Sarah Lane  is a 57 y.o. female with a PMHx of diabetes mellitus type 2, seizure disorder, hypertension, dysrhythmia, and history of ovarian cancer.  Patient presents to the emergency department with difficulty urinating and has been admitted for Hypoglycemia [E16.2] Acute renal failure, unspecified acute renal failure type (HCC) [N17.9] Acute kidney injury superimposed on chronic kidney disease (HCC) [N17.9, N18.9]  Patient is known to our practice from previous admission.   Update Patient seen resting in bed Alert and oriented Tolerating meals Room air  Making very little urine  Objective:  Vital signs in last 24 hours:  Temp:  [97.4 F (36.3 C)-98.4 F (36.9 C)] 97.5 F (36.4 C) (12/06 0757) Pulse Rate:  [74-77] 74 (12/06 0757) Resp:  [18] 18 (12/06 0757) BP: (111-126)/(59-73) 120/69 (12/06 0757) SpO2:  [98 %] 98 % (12/06 0757)  Weight change: -0.9 kg Filed Weights   07/14/23 1120 07/15/23 0824 07/15/23 1231  Weight: 61.9 kg 61.9 kg 61.9 kg    Intake/Output: I/O last 3 completed shifts: In: 270 [P.O.:120; IV Piggyback:150] Out: 150 [Urine:150]   Intake/Output this shift:  No intake/output data recorded.  Physical Exam: General: NAD  Head: Normocephalic, atraumatic.  Dry oral mucosal membranes  Eyes: Anicteric  Lungs:  Clear to auscultation, normal effort  Heart: Regular rate and rhythm  Abdomen:  Soft, nontender, nondistended  Extremities: No peripheral edema.  Neurologic: Alert and oriented, moving all four extremities  Skin: No lesions  Access: Rt chest permcath    Basic Metabolic Panel: Recent Labs  Lab 07/12/23 0217 07/13/23 0517 07/14/23 0622 07/15/23 0559 07/16/23 0601  NA 132* 137 136 135 134*  K 3.0* 3.2* 4.0 3.3* 3.4*  CL 86* 92* 95* 98 99  CO2 31 31 28 26 24   GLUCOSE 140* 107* 113* 92 83  BUN 66* 59* 29* 17 10  CREATININE 6.58* 6.34* 3.59* 2.31* 1.63*  CALCIUM 7.8* 7.6*  7.6* 8.2* 8.6*  MG  --   --   --  1.3* 2.0  PHOS 2.6 2.2* 2.1* 2.3* 1.8*    Liver Function Tests: Recent Labs  Lab 07/09/23 2115 07/11/23 0857 07/12/23 0217 07/13/23 0517 07/14/23 0622 07/15/23 0559 07/16/23 0601  AST 26  --   --   --   --   --   --   ALT 14  --   --   --   --   --   --   ALKPHOS 52  --   --   --   --   --   --   BILITOT 1.3*  --   --   --   --   --   --   PROT 7.8  --   --   --   --   --   --   ALBUMIN 4.5   < > 3.1* 3.1* 3.4* 3.1* 3.1*   < > = values in this interval not displayed.   Recent Labs  Lab 07/09/23 2115  LIPASE 120*   No results for input(s): "AMMONIA" in the last 168 hours.  CBC: Recent Labs  Lab 07/09/23 2115 07/10/23 0415 07/11/23 0857 07/14/23 0622 07/15/23 0843  WBC 14.8* 10.7* 9.5 8.7 10.0  NEUTROABS 9.9*  --   --   --   --   HGB 13.8 11.5* 11.2* 12.1 12.3  HCT 41.7 34.3* 31.1* 34.6* 36.1  MCV 90.3 89.3 83.2 85.6 87.4  PLT 296  202 184 175 175    Cardiac Enzymes: No results for input(s): "CKTOTAL", "CKMB", "CKMBINDEX", "TROPONINI" in the last 168 hours.  BNP: Invalid input(s): "POCBNP"  CBG: Recent Labs  Lab 07/13/23 1629 07/13/23 2121 07/14/23 0831 07/14/23 1150 07/14/23 1601  GLUCAP 121* 115* 90 88 105*    Microbiology: Results for orders placed or performed during the hospital encounter of 07/09/23  Urine Culture (for pregnant, neutropenic or urologic patients or patients with an indwelling urinary catheter)     Status: Abnormal   Collection Time: 07/11/23  5:30 AM   Specimen: Urine, Clean Catch  Result Value Ref Range Status   Specimen Description   Final    URINE, CLEAN CATCH Performed at [facility], [address], [place], [place] [address]    Special Requests   Final    NONE Performed at [facility], [address] [place], [place] [address]    Culture >=100,000 COLONIES/mL KLEBSIELLA PNEUMONIAE (A)  Final   Report Status 07/13/2023 FINAL  Final   Organism ID, Bacteria  KLEBSIELLA PNEUMONIAE (A)  Final      Susceptibility   Klebsiella pneumoniae - MIC*    AMPICILLIN RESISTANT Resistant     CEFAZOLIN <=4 SENSITIVE Sensitive     CEFEPIME <=0.12 SENSITIVE Sensitive     CEFTRIAXONE <=0.25 SENSITIVE Sensitive     CIPROFLOXACIN <=0.25 SENSITIVE Sensitive     GENTAMICIN <=1 SENSITIVE Sensitive     IMIPENEM <=0.25 SENSITIVE Sensitive     NITROFURANTOIN 32 SENSITIVE Sensitive     TRIMETH/SULFA <=20 SENSITIVE Sensitive     AMPICILLIN/SULBACTAM 4 SENSITIVE Sensitive     PIP/TAZO <=4 SENSITIVE Sensitive ug/mL    * >=100,000 COLONIES/mL KLEBSIELLA PNEUMONIAE    Coagulation Studies: No results for input(s): "LABPROT", "INR" in the last 72 hours.  Urinalysis: No results for input(s): "COLORURINE", "LABSPEC", "PHURINE", "GLUCOSEU", "HGBUR", "BILIRUBINUR", "KETONESUR", "PROTEINUR", "UROBILINOGEN", "NITRITE", "LEUKOCYTESUR" in the last 72 hours.  Invalid input(s): "APPERANCEUR"     Imaging: DG Chest 1 View  Result Date: 07/14/2023 CLINICAL DATA:  End-stage renal disease EXAM: CHEST  1 VIEW COMPARISON:  03/31/2023, chest CT report 12/16/2019 FINDINGS: Right-sided central venous catheter tips at the SVC. 15 mm right lower lobe pulmonary nodule as seen on multiple prior exams. No acute airspace disease. Stable cardiomediastinal silhouette. No pneumothorax IMPRESSION: 1. No active disease. 2. 15 mm right lower lobe pulmonary nodule as seen on multiple prior exams. Electronically Signed   By: Jasmine Pang M.D.   On: 07/14/2023 21:06     Medications:      aspirin  81 mg Oral Daily   atorvastatin  80 mg Oral Daily   Chlorhexidine Gluconate Cloth  6 each Topical Q0600   heparin injection (subcutaneous)  5,000 Units Subcutaneous Q12H   metoprolol succinate  12.5 mg Oral Daily   pantoprazole  40 mg Oral Daily   sodium chloride flush  3 mL Intravenous Q12H   acetaminophen **OR** acetaminophen, magnesium hydroxide, ondansetron **OR** ondansetron (ZOFRAN) IV, sodium  chloride flush, traZODone  Assessment/ Plan:  [name] is a 57 y.o.  female  is a 57 y.o. female with a PMHx of diabetes mellitus type 2, seizure disorder, hypertension, dysrhythmia, and history of ovarian cancer.  Patient presents to the emergency department complaining of difficulty with urination.  Patient has been admitted for Hypoglycemia [E16.2] Acute renal failure, unspecified acute renal failure type (HCC) [N17.9] Acute kidney injury superimposed on chronic kidney disease (HCC) [N17.9, N18.9]   Acute  Kidney Injury on chronic kidney disease stage IIIa with baseline creatinine 1.27 and GFR of 49 on 04/07/23.  Acute kidney injury resulting in ATN secondary to volume depletion and continued ACE/ARB use. CT abdomen pelvis showed bilateral nonobstructing cyst.  Renal ultrasound confirms no obstruction and presence of simple renal cyst.  Also questionable for cystitis.  Will order serology workup to evaluate any underlying cause.  Dialysis initiated, 07/13/2023.    Next treatment scheduled for Saturday.   Will continue to monitor for renal recovery as creatinine and BUN have improved however patient remains oliguric. Appreciate renal navigator confirming outpatient dialysis clinic at [facility] on a TTS schedule, first treatment scheduled for Tuesday at 1130.  Transportation concerns have been resolved.  Is stable, patient cleared to discharge after treatment on Saturday.  At patient request, will liberalize diet to carb modified with a 1500 mL fluid restriction.  Lab Results  Component Value Date   CREATININE 1.63 (H) 07/16/2023   CREATININE 2.31 (H) 07/15/2023   CREATININE 3.59 (H) 07/14/2023    Intake/Output Summary (Last 24 hours) at 07/16/2023 1541 Last data filed at 07/15/2023 1705 Gross per 24 hour  Intake 270 ml  Output --  Net 270 ml   2.  Hyponatremia/hyperkalemia.  Potassium 5.2 on admission, corrected to 4.1 after shifting measures.  Sodium 131 on admission.   Likely due to poor oral intake in combination with kidney injury.    Will continue to monitor and manage with dialysis.  3.  Acute metabolic acidosis.  Serum bicarb corrected  4. Anemia of chronic kidney disease Lab Results  Component Value Date   HGB 12.3 07/15/2023    Hemoglobin 12.3, within optimal range.  5. Diabetes mellitus type II with chronic kidney disease: noninsulin dependent. Home regimen includes metformin. Most recent hemoglobin A1c is 8.2 on 03/31/23.   Sliding scale insulin managed by primary team.  Glucose well-controlled at this time.    LOS: 7 Adella Manolis 12/6/20243:41 PM

## 2023-07-16 NOTE — Progress Notes (Signed)
PHARMACY CONSULT NOTE - FOLLOW UP  Pharmacy Consult for Electrolyte Monitoring and Replacement   Recent Labs: Potassium (mmol/L)  Date Value  07/16/2023 3.4 (L)   Magnesium (mg/dL)  Date Value  60/63/0160 2.0   Calcium (mg/dL)  Date Value  10/93/2355 8.6 (L)   Albumin (g/dL)  Date Value  73/22/0254 3.1 (L)   Phosphorus (mg/dL)  Date Value  27/01/2375 1.8 (L)   Sodium (mmol/L)  Date Value  07/16/2023 134 (L)   Corrected Ca: 8.9 mg/dL  Assessment: 57 y.o. female  with a PMHx of diabetes mellitus type 2, seizure disorder, hypertension, dysrhythmia, and history of ovarian cancer.  Patient presented to the emergency department with difficulty urinating was been admitted for hypoglycemia and AKI now progressed to HD  Goal of Therapy:  Electrolytes WNL  Plan:  K = 3.4 this AM Per neuro notes  " Will manage electrolytes with dialysis". Will dc electrolytes consult and follow peripherally.   Nastassia Bazaldua Rodriguez-Guzman PharmD, BCPS 07/16/2023 10:59 AM

## 2023-07-16 NOTE — TOC Progression Note (Addendum)
Transition of Care [facility]) - Progression Note    Patient Details  Name: Sarah Lane MRN: [id number] Date of Birth: April 30, 1966  Transition of Care [facility]) CM/SW Contact  Liliana Cline, LCSW Phone Number: 07/16/2023, 9:27 AM  Clinical Narrative:    Reached out to [facility] and asked to be updated with any needs/updates regarding patient's dialysis placement.  10:34- Per Judyann Munson "Patient has been secured with [facility] Tuesday, Thursday, and Saturday at 11:30 am and can start on Tuesday 12/10. On Tuesday she will need to arrive at 11 am." Updated Care Team.  11:20- Spoke to patient, she reports she does not have a ride to dialysis. She states her friend is not able to take her to and from dialysis. Reached out to [facility] [phone])  with [employer]. Shameka states she does not set this up. Called [facility]  [phone]). Left a message for their SW requesting a return call.  2:10- Call from [facility]. She states it is ultimately up to the patient to set up transportation to and from dialysis. She states she will be back in the office on Monday. Sent encrypted email to [employer] on Medicaid covering HD transport.   Expected Discharge Plan: Home/Self Care Barriers to Discharge: Continued Medical Work up  Expected Discharge Plan and Services       Living arrangements for the past 2 months: Single Family Home                                       Social Determinants of Health (SDOH) Interventions SDOH Screenings   Food Insecurity: No Food Insecurity (07/10/2023)  Housing: Medium Risk (07/10/2023)  Transportation Needs: Unmet Transportation Needs (07/10/2023)  Utilities: Not At Risk (07/10/2023)  Financial Resource Strain: Medium Risk (05/17/2023)   Received from [facility]  Physical Activity: Insufficiently Active (05/17/2023)   Received from [facility]  Social Connections: Socially Isolated (05/18/2023)    Received from [facility]  Stress: No Stress Concern Present (05/18/2023)   Received from [facility]  Tobacco Use: Unknown (07/09/2023)  Health Literacy: Medium Risk (05/17/2023)   Received from [facility]    Readmission Risk Interventions    07/11/2023   11:51 AM  Readmission Risk Prevention Plan  Transportation Screening Complete  PCP or Specialist Appt within 5-7 Days Complete  Home Care Screening Complete  Medication Review (RN CM) Complete

## 2023-07-16 NOTE — Progress Notes (Signed)
Initial Nutrition Assessment  DOCUMENTATION CODES:   Severe malnutrition in context of acute illness/injury  INTERVENTION:   Ensure Enlive po BID, each supplement provides 350 kcal and 20 grams of protein.  Rena-vit po daily  Pt at high refeed risk; recommend monitor potassium, magnesium and phosphorus labs daily until stable  Daily weights   Renal diet education   NUTRITION DIAGNOSIS:   Severe Malnutrition related to acute illness as evidenced by 23 percent weight loss in 3 months, moderate muscle depletion, severe muscle depletion.  GOAL:   Patient will meet greater than or equal to 90% of their needs  MONITOR:   PO intake, Supplement acceptance, Labs, Weight trends, I & O's, Skin  REASON FOR ASSESSMENT:   Consult Diet education  ASSESSMENT:   57 y/o female with h/o type II diabetes mellitus, seizures, HTN, dysrhythmia, cardiomyopathy, CHF, stroke s/p thrombectomy, hepatosteatosis, lung nodule, ovarian cancer, recent admission for DKA and recent cholecystitis s/p robotic assisted laparoscopic cholecystectomy with & lysis of adhesions 11/13 who is now admitted with AKI/CKD requiring new dialysis 12/3.  Met with pt in room today. Pt reports decreased appetite and oral intake for several months pta. Pt reports that she has lost ~60lbs since June. Pt relates her weight loss and poor oral intake to her recent stroke and chronic issues with cholecystitis. Per chart, pt is down 41lbs(23%) over the past 3 months; this is severe weight loss. Pt reports that her appetite is slowly improving. Pt reports drinking juice and broth today. RD discussed with pt the importance of adequate nutrition needed to preserve lean muscle and support losses from HD. Pt reports that she has been drinking premier protein at home and would like to have chocolate Ensure in hospital. Pt is actively refeeding; electrolytes being monitored and supplemented by pharmacy. Pt provided with renal diet education  today.   Medications reviewed and include: aspirin, heparin, protonix  Labs reviewed: Na 134(L), K 3.4(L), creat 1.63(H), P 1.8(L), Mg 2.0 wnl Cbgs- 105, 88, 90 x 24 hrs  AIC 8.2(H)- 8/21  NUTRITION - FOCUSED PHYSICAL EXAM:  Flowsheet Row Most Recent Value  Orbital Region No depletion  Upper Arm Region No depletion  Thoracic and Lumbar Region No depletion  Buccal Region Mild depletion  Temple Region Mild depletion  Clavicle Bone Region Mild depletion  Clavicle and Acromion Bone Region Mild depletion  Scapular Bone Region No depletion  Dorsal Hand Moderate depletion  Patellar Region Severe depletion  Anterior Thigh Region Severe depletion  Posterior Calf Region Severe depletion  Edema (RD Assessment) Mild  Hair Reviewed  Eyes Reviewed  Mouth Reviewed  Skin Reviewed  Nails Reviewed   Diet Order:   Diet Order             Diet Carb Modified Fluid consistency: Thin; Room service appropriate? Yes; Fluid restriction: 1500 mL Fluid  Diet effective now                  EDUCATION NEEDS:   Education needs have been addressed  Skin:  Skin Assessment: Reviewed RN Assessment  Last BM:  12/5  Height:   Ht Readings from Last 1 Encounters:  07/09/23 5' (1.524 m)    Weight:   Wt Readings from Last 1 Encounters:  07/16/23 63.4 kg    Ideal Body Weight:  45.45 kg  BMI:  Body mass index is 27.3 kg/m.  Estimated Nutritional Needs:   Kcal:  1600-1800kcal/day  Protein:  80-90g/day  Fluid:  per MD  Betsey Holiday MS, RD, LDN Please refer to [facility] for RD and/or RD on-call/weekend/after hours pager

## 2023-07-16 NOTE — Plan of Care (Signed)
Nutrition Education Note  RD consulted for Renal Education. Provided "Low sodium Nutrition Therapy" handout from the Academy of [employer]etics. Reviewed food groups and provided written recommended serving sizes specifically determined for patient's current nutritional status.   Explained why diet restrictions are needed and provided lists of foods to limit/avoid that are high potassium, sodium and phosphorus. Provided specific recommendations on safer alternatives of these foods. Strongly encouraged compliance of this diet.   Discussed importance of protein intake at each meal and snack. Provided examples of how to maximize protein intake throughout the day. Discussed fluid restriction with dialysis, importance of minimizing weight gain between HD treatments and renal-friendly beverage options.  Encouraged pt to take a daily renal multivitamin   Encouraged pt to discuss specific diet questions/concerns with RD at HD outpatient facility. Teach back method used.  Expect fair compliance.  RD following this patient   Betsey Holiday MS, RD, LDN Please refer to [facility] for RD and/or RD on-call/weekend/after hours pager

## 2023-07-17 DIAGNOSIS — N179 Acute kidney failure, unspecified: Secondary | ICD-10-CM | POA: Diagnosis not present

## 2023-07-17 DIAGNOSIS — N189 Chronic kidney disease, unspecified: Secondary | ICD-10-CM | POA: Diagnosis not present

## 2023-07-17 LAB — CBC WITH DIFFERENTIAL/PLATELET
Abs Immature Granulocytes: 0.03 10*3/uL (ref 0.00–0.07)
Basophils Absolute: 0 10*3/uL (ref 0.0–0.1)
Basophils Relative: 0 %
Eosinophils Absolute: 1.1 10*3/uL — ABNORMAL HIGH (ref 0.0–0.5)
Eosinophils Relative: 14 %
HCT: 30.6 % — ABNORMAL LOW (ref 36.0–46.0)
Hemoglobin: 10.8 g/dL — ABNORMAL LOW (ref 12.0–15.0)
Immature Granulocytes: 0 %
Lymphocytes Relative: 23 %
Lymphs Abs: 1.8 10*3/uL (ref 0.7–4.0)
MCH: 29.9 pg (ref 26.0–34.0)
MCHC: 35.3 g/dL (ref 30.0–36.0)
MCV: 84.8 fL (ref 80.0–100.0)
Monocytes Absolute: 0.7 10*3/uL (ref 0.1–1.0)
Monocytes Relative: 10 %
Neutro Abs: 4.2 10*3/uL (ref 1.7–7.7)
Neutrophils Relative %: 53 %
Platelets: 163 10*3/uL (ref 150–400)
RBC: 3.61 MIL/uL — ABNORMAL LOW (ref 3.87–5.11)
RDW: 12.8 % (ref 11.5–15.5)
WBC: 7.8 10*3/uL (ref 4.0–10.5)
nRBC: 0 % (ref 0.0–0.2)

## 2023-07-17 LAB — BASIC METABOLIC PANEL
Anion gap: 10 (ref 5–15)
BUN: 12 mg/dL (ref 6–20)
CO2: 25 mmol/L (ref 22–32)
Calcium: 9.1 mg/dL (ref 8.9–10.3)
Chloride: 100 mmol/L (ref 98–111)
Creatinine, Ser: 1.68 mg/dL — ABNORMAL HIGH (ref 0.44–1.00)
GFR, Estimated: 35 mL/min — ABNORMAL LOW (ref >60)
Glucose, Bld: 95 mg/dL (ref 70–99)
Potassium: 3.6 mmol/L (ref 3.5–5.1)
Sodium: 135 mmol/L (ref 135–145)

## 2023-07-17 LAB — MAGNESIUM: Magnesium: 1.9 mg/dL (ref 1.7–2.4)

## 2023-07-17 LAB — PHOSPHORUS: Phosphorus: 2.3 mg/dL — ABNORMAL LOW (ref 2.5–4.6)

## 2023-07-17 MED ORDER — ENSURE ENLIVE PO LIQD: 237.0000 mL | Freq: Two times a day (BID) | ORAL | 12 refills | Status: AC

## 2023-07-17 MED ORDER — RENA-VITE PO TABS: 1.0000 | ORAL_TABLET | Freq: Every day | ORAL | 1 refills | Status: AC

## 2023-07-17 MED ORDER — HEPARIN SODIUM (PORCINE) 1000 UNIT/ML DIALYSIS: 1000.0000 [IU] | INTRAMUSCULAR | Status: DC | PRN

## 2023-07-17 MED ORDER — ALTEPLASE 2 MG IJ SOLR: 2.0000 mg | Freq: Once | INTRAMUSCULAR | Status: DC | PRN

## 2023-07-17 NOTE — Plan of Care (Signed)
  Problem: Education: Goal: Knowledge of General Education information will improve Description: Including pain rating scale, medication(s)/side effects and non-pharmacologic comfort measures Outcome: Adequate for Discharge   Problem: Health Behavior/Discharge Planning: Goal: Ability to manage health-related needs will improve Outcome: Adequate for Discharge   Problem: Clinical Measurements: Goal: Ability to maintain clinical measurements within normal limits will improve Outcome: Adequate for Discharge Goal: Will remain free from infection Outcome: Adequate for Discharge Goal: Diagnostic test results will improve Outcome: Adequate for Discharge Goal: Respiratory complications will improve Outcome: Adequate for Discharge Goal: Cardiovascular complication will be avoided Outcome: Adequate for Discharge   Problem: Activity: Goal: Risk for activity intolerance will decrease Outcome: Adequate for Discharge   Problem: Nutrition: Goal: Adequate nutrition will be maintained Outcome: Adequate for Discharge   Problem: Coping: Goal: Level of anxiety will decrease Outcome: Adequate for Discharge   Problem: Elimination: Goal: Will not experience complications related to bowel motility Outcome: Adequate for Discharge Goal: Will not experience complications related to urinary retention Outcome: Adequate for Discharge   Problem: Pain Management: Goal: General experience of comfort will improve Outcome: Adequate for Discharge   Problem: Safety: Goal: Ability to remain free from injury will improve Outcome: Adequate for Discharge   Problem: Skin Integrity: Goal: Risk for impaired skin integrity will decrease Outcome: Adequate for Discharge   Problem: Education: Goal: Knowledge of disease and its progression will improve Outcome: Adequate for Discharge   Problem: Fluid Volume: Goal: Compliance with measures to maintain balanced fluid volume will improve Outcome: Adequate for  Discharge   Problem: Health Behavior/Discharge Planning: Goal: Ability to manage health-related needs will improve Outcome: Adequate for Discharge   Problem: Nutritional: Goal: Ability to make healthy dietary choices will improve Outcome: Adequate for Discharge   Problem: Clinical Measurements: Goal: Complications related to the disease process, condition or treatment will be avoided or minimized Outcome: Adequate for Discharge

## 2023-07-17 NOTE — Progress Notes (Signed)
[facility]  ROUNDING NOTE   Subjective:   Sarah Lane  is a 57 y.o. female with a PMHx of diabetes mellitus type 2, seizure disorder, hypertension, dysrhythmia, and history of ovarian cancer.  Patient presents to the emergency department with difficulty urinating and has been admitted for Hypoglycemia [E16.2] Acute renal failure, unspecified acute renal failure type (HCC) [N17.9] Acute kidney injury superimposed on chronic kidney disease (HCC) [N17.9, N18.9]  Seen on hemodialysis treatment for acute kidney injury  Objective:  Vital signs in last 24 hours:  Temp:  [97.5 F (36.4 C)-98.6 F (37 C)] 98 F (36.7 C) (12/07 1153) Pulse Rate:  [64-87] 82 (12/07 1153) Resp:  [13-26] 16 (12/07 1153) BP: (98-140)/(60-96) 139/80 (12/07 1153) SpO2:  [99 %-100 %] 100 % (12/07 1153) Weight:  [62.2 kg-63.4 kg] 62.3 kg (12/07 1153)  Weight change: 1.513 kg Filed Weights   07/17/23 0348 07/17/23 0750 07/17/23 1153  Weight: 63.4 kg 62.2 kg 62.3 kg    Intake/Output: No intake/output data recorded.   Intake/Output this shift:  Total I/O In: 300 [P.O.:300] Out: 0   Physical Exam: General: NAD  Head: Normocephalic, atraumatic.  Dry oral mucosal membranes  Eyes: Anicteric  Lungs:  Clear to auscultation, normal effort  Heart: Regular rate and rhythm  Abdomen:  Soft, nontender, nondistended  Extremities: No peripheral edema.  Neurologic: Alert and oriented, moving all four extremities  Skin: No lesions  Access: Rt chest permcath    Basic Metabolic Panel: Recent Labs  Lab 07/13/23 0517 07/14/23 0622 07/15/23 0559 07/16/23 0601 07/17/23 0547  NA 137 136 135 134* 135  K 3.2* 4.0 3.3* 3.4* 3.6  CL 92* 95* 98 99 100  CO2 31 28 26 24 25   GLUCOSE 107* 113* 92 83 95  BUN 59* 29* 17 10 12   CREATININE 6.34* 3.59* 2.31* 1.63* 1.68*  CALCIUM 7.6* 7.6* 8.2* 8.6* 9.1  MG  --   --  1.3* 2.0 1.9  PHOS 2.2* 2.1* 2.3* 1.8* 2.3*    Liver Function Tests: Recent Labs  Lab  07/12/23 0217 07/13/23 0517 07/14/23 0622 07/15/23 0559 07/16/23 0601  ALBUMIN 3.1* 3.1* 3.4* 3.1* 3.1*   No results for input(s): "LIPASE", "AMYLASE" in the last 168 hours.  No results for input(s): "AMMONIA" in the last 168 hours.  CBC: Recent Labs  Lab 07/11/23 0857 07/14/23 0622 07/15/23 0843 07/17/23 0547  WBC 9.5 8.7 10.0 7.8  NEUTROABS  --   --   --  4.2  HGB 11.2* 12.1 12.3 10.8*  HCT 31.1* 34.6* 36.1 30.6*  MCV 83.2 85.6 87.4 84.8  PLT 184 175 175 163    Cardiac Enzymes: No results for input(s): "CKTOTAL", "CKMB", "CKMBINDEX", "TROPONINI" in the last 168 hours.  BNP: Invalid input(s): "POCBNP"  CBG: Recent Labs  Lab 07/13/23 1629 07/13/23 2121 07/14/23 0831 07/14/23 1150 07/14/23 1601  GLUCAP 121* 115* 90 88 105*    Microbiology: Results for orders placed or performed during the hospital encounter of 07/09/23  Urine Culture (for pregnant, neutropenic or urologic patients or patients with an indwelling urinary catheter)     Status: Abnormal   Collection Time: 07/11/23  5:30 AM   Specimen: Urine, Clean Catch  Result Value Ref Range Status   Specimen Description   Final    URINE, CLEAN CATCH Performed at [facility], [address] [place], [place] [address]    Special Requests   Final    NONE Performed at [facility], [address] [place], [place] [address]    Culture >=100,000 COLONIES/mL KLEBSIELLA PNEUMONIAE (A)  Final   Report Status 07/13/2023 FINAL  Final   Organism ID, Bacteria KLEBSIELLA PNEUMONIAE (A)  Final      Susceptibility   Klebsiella pneumoniae - MIC*    AMPICILLIN RESISTANT Resistant     CEFAZOLIN <=4 SENSITIVE Sensitive     CEFEPIME <=0.12 SENSITIVE Sensitive     CEFTRIAXONE <=0.25 SENSITIVE Sensitive     CIPROFLOXACIN <=0.25 SENSITIVE Sensitive     GENTAMICIN <=1 SENSITIVE Sensitive     IMIPENEM <=0.25 SENSITIVE Sensitive     NITROFURANTOIN 32 SENSITIVE Sensitive     TRIMETH/SULFA <=20 SENSITIVE  Sensitive     AMPICILLIN/SULBACTAM 4 SENSITIVE Sensitive     PIP/TAZO <=4 SENSITIVE Sensitive ug/mL    * >=100,000 COLONIES/mL KLEBSIELLA PNEUMONIAE    Coagulation Studies: No results for input(s): "LABPROT", "INR" in the last 72 hours.  Urinalysis: No results for input(s): "COLORURINE", "LABSPEC", "PHURINE", "GLUCOSEU", "HGBUR", "BILIRUBINUR", "KETONESUR", "PROTEINUR", "UROBILINOGEN", "NITRITE", "LEUKOCYTESUR" in the last 72 hours.  Invalid input(s): "APPERANCEUR"     Imaging: No results found.   Medications:      aspirin  81 mg Oral Daily   atorvastatin  80 mg Oral Daily   Chlorhexidine Gluconate Cloth  6 each Topical Q0600   feeding supplement  237 mL Oral BID BM   heparin injection (subcutaneous)  5,000 Units Subcutaneous Q12H   metoprolol succinate  12.5 mg Oral Daily   multivitamin  1 tablet Oral QHS   pantoprazole  40 mg Oral Daily   sodium chloride flush  3 mL Intravenous Q12H   acetaminophen **OR** acetaminophen, magnesium hydroxide, ondansetron **OR** ondansetron (ZOFRAN) IV, sodium chloride flush, traZODone  Assessment/ Plan:  [name] is a 57 y.o.  female  is a 57 y.o. female with a PMHx of diabetes mellitus type 2, seizure disorder, hypertension, dysrhythmia, and history of ovarian cancer.  Patient presents to the emergency department complaining of difficulty with urination.  Patient has been admitted for Hypoglycemia [E16.2] Acute renal failure, unspecified acute renal failure type (HCC) [N17.9] Acute kidney injury superimposed on chronic kidney disease (HCC) [N17.9, N18.9]   Acute Kidney Injury on chronic kidney disease stage IIIa with baseline creatinine 1.27 and GFR of 49 on 04/07/23.  Acute kidney injury resulting in ATN secondary to volume depletion and continued ACE/ARB use. CT abdomen pelvis showed bilateral nonobstructing cyst.  Renal ultrasound confirms no obstruction and presence of simple renal cyst.  Also questionable for cystitis.  Will  order serology workup to evaluate any underlying cause.  Dialysis initiated, 07/13/2023.    Next treatment scheduled for Saturday.   Will continue to monitor for renal recovery as creatinine and BUN have improved however patient remains oliguric. Appreciate renal navigator confirming outpatient dialysis clinic at [facility] on a TTS schedule, first treatment scheduled for Tuesday at 1130.  Transportation concerns have been resolved.  Is stable, patient cleared to discharge after treatment on Saturday.  At patient request, will liberalize diet to carb modified with a 1500 mL fluid restriction.  Lab Results  Component Value Date   CREATININE 1.68 (H) 07/17/2023   CREATININE 1.63 (H) 07/16/2023   CREATININE 2.31 (H) 07/15/2023    Intake/Output Summary (Last 24 hours) at 07/17/2023 1831 Last data filed at 07/17/2023 1400 Gross per 24 hour  Intake 300 ml  Output 0 ml  Net 300 ml   2.  Hyponatremia/hyperkalemia.  Potassium 5.2 on admission, corrected to 4.1  after shifting measures.  Sodium 131 on admission.  Likely due to poor oral intake in combination with kidney injury.    Will continue to monitor and manage with dialysis.  3.  Acute metabolic acidosis.  Serum bicarb corrected  4. Anemia of chronic kidney disease Lab Results  Component Value Date   HGB 10.8 (L) 07/17/2023    Hemoglobin 12.3, within optimal range.  5. Diabetes mellitus type II with chronic kidney disease: noninsulin dependent. Home regimen includes metformin. Most recent hemoglobin A1c is 8.2 on 03/31/23.   Sliding scale insulin managed by primary team.  Glucose well-controlled at this time.    LOS: 8 Isaiahs Chancy 12/7/20246:31 PM

## 2023-07-17 NOTE — TOC Transition Note (Addendum)
Transition of Care [facility]) - CM/SW Discharge Note   Patient Details  Name: Sarah Lane MRN: [id number] Date of Birth: 1965/08/28  [facility] CM/SW Contact:  Liliana Cline, LCSW Phone Number: 07/17/2023, 12:35 PM   Clinical Narrative:    Patient to discharge home to [address], [place], [place] [address] today after her dialysis treatment. Patient confirms she needs a cab voucher. Voucher has been provided. Patient requested housing resources, added to AVS.   Final next level of care: Home/Self Care Barriers to Discharge: Barriers Resolved   Patient Goals and CMS Choice      Discharge Placement                         Discharge Plan and Services Additional resources added to the After Visit Summary for                                       Social Determinants of Health (SDOH) Interventions SDOH Screenings   Food Insecurity: No Food Insecurity (07/10/2023)  Housing: Medium Risk (07/10/2023)  Transportation Needs: Unmet Transportation Needs (07/10/2023)  Utilities: Not At Risk (07/10/2023)  Financial Resource Strain: Medium Risk (05/17/2023)   Received from [facility]  Physical Activity: Insufficiently Active (05/17/2023)   Received from [facility]  Social Connections: Socially Isolated (05/18/2023)   Received from [facility]  Stress: No Stress Concern Present (05/18/2023)   Received from [facility]  Tobacco Use: Unknown (07/09/2023)  Health Literacy: Medium Risk (05/17/2023)   Received from [facility]     Readmission Risk Interventions    07/11/2023   11:51 AM  Readmission Risk Prevention Plan  Transportation Screening Complete  PCP or Specialist Appt within 5-7 Days Complete  Home Care Screening Complete  Medication Review (RN CM) Complete

## 2023-07-17 NOTE — Progress Notes (Signed)
Received patient in bed to unit.    Informed consent signed and in chart.    TX duration: 3.5 hrs     Transported back to floor  Hand-off given to patient's nurse.   Access used:  rt chest cvc Access issues: n/a  Total UF removed: 0 mls       Maple Hudson, RN Dialysis Unit

## 2023-07-17 NOTE — Discharge Summary (Addendum)
Physician Discharge Summary   Patient: Sarah Lane MRN: [id number] DOB: [birth year]  Admit date:     07/09/2023  Discharge date: 07/17/23  Discharge Physician: Sarah Lane   PCP: [facility], [facility]   Recommendations at discharge:   Dialysis will be on Tuesday at 1130 with ongoing dialysis on Tues-Th-Sat Continue to hold metformin for now Continue to hold losartan Follow up with vascular surgery in 5 weeks Follow up with PCP in 1-2 weeks  Discharge Diagnoses: Principal Problem:   Acute kidney injury superimposed on chronic kidney disease (HCC) Active Problems:   Hypoglycemia   Non-insulin dependent type 2 diabetes mellitus (HCC)   Dyslipidemia   Essential hypertension   High anion gap metabolic acidosis   Chronic HFrEF (heart failure with reduced ejection fraction) (HCC)   GERD without esophagitis   Hyponatremia   UTI (urinary tract infection)   Encounter for insertion of tunneled central venous catheter (CVC) with port   Hypokalemia   Protein-calorie malnutrition, severe    Hospital Course: 57yo with h/o asthma, HFrEF, DM, HTN, and PSVT who presented on 11/29 with acute urinary retention.  She was found to have AKI that was thought to be related to ATN but remained anuric and so was started on HD.  Assessment and Plan:  New onset ESRD Stage 3b CKD on admission in 03/2023, bordering on stage 4 Cr 5.85 on admission this time with anuria not responsive to IVF Possibly related to ATN  Renal U/S without obstruction. Nephrology following, starting dialysis Vascular surgery consulted for HD access, s/p Right IJ tunneled dialysis catheter  Underwent HD 12/3, 12/4, 12/5 Avoid nephrotoxins & hypotension Nephrology is working to establish outpatient HD - will start Tuesday, 12/10 DC after HD on 12/7   Hypoglycemia with NIDDM POA, suspect due to severe AKI CBG was 45 on admission & had recurrent episodes on hospital day 1 Resolved Recent A1c was 5.3 Hold metformin  as inpatient and on an ongoing basis for now   Dyslipidemia On statin   Essential hypertension Hold home Cozaar with renal failure Continue Toprol-XL   Electrolyte abnormality Hypokalemia, hypomagnesemia Repleted with resolution Electrolytes per pharmacy   UTI (urinary tract infection) Minimal urine output since admission UA turbid with large leukocytes, few bacteria, > 50 WBC's Urine culture grew Klebsiella pneumoniae resistant only to ampicillin Completed Ceftriaxone   GERD without esophagitis On PPI   Chronic HFrEF (heart failure with reduced ejection fraction)  Echo on 8/22 with EF 30-35%, grade 1 diastolic dysfunction Volume status stable / dry. Dialysis for volume management Daily weights   CAD R/LHC on 04/06/23 with severe single-vessel CAD with occlusion to vessel too small and distal for PCI Medication management   High anion gap metabolic acidosis Due to acute renal failure Resolved  Nutrition Status: Nutrition Problem: Severe Malnutrition Etiology: acute illness Signs/Symptoms: percent weight loss, moderate muscle depletion, severe muscle depletion Percent weight loss: 23 %           Consultants: Nephrology   Procedures: [facility] placement 12/3 HD 12/3, 12/4   Antibiotics: Ceftriaxone 12/1-5 Cefazolin x 1    Pain control - [place] Controlled Substance Reporting System database was reviewed. and patient was instructed, not to drive, operate heavy machinery, perform activities at heights, swimming or participation in water activities or provide baby-sitting services while on Pain, Sleep and Anxiety Medications; until their outpatient Physician has advised to do so again. Also recommended to not to take more than prescribed Pain, Sleep and Anxiety Medications.  Disposition: Home Diet recommendation:  Renal diet DISCHARGE MEDICATION: Allergies as of 07/17/2023       Reactions   Jardiance [empagliflozin]    Multiple side effect complications  including: DKA, pancreatitis, renal failure. Avoid re-prescribing   Codeine Nausea And Vomiting   Ibuprofen Nausea And Vomiting        Medication List     STOP taking these medications    furosemide 40 MG tablet Commonly known as: LASIX   losartan 25 MG tablet Commonly known as: COZAAR   metFORMIN 1000 MG tablet Commonly known as: GLUCOPHAGE       TAKE these medications    aspirin 81 MG chewable tablet Chew 81 mg by mouth in the morning.   atorvastatin 80 MG tablet Commonly known as: LIPITOR Take 80 mg by mouth in the morning.   feeding supplement Liqd Take 237 mLs by mouth 2 (two) times daily between meals.   metoprolol succinate 25 MG 24 hr tablet Commonly known as: TOPROL-XL Take 12.5 mg by mouth in the morning.   multivitamin Tabs tablet Take 1 tablet by mouth at bedtime.   omeprazole 40 MG capsule Commonly known as: PRILOSEC Take 1 capsule (40 mg total) by mouth daily.        Follow-up Information     Lane, Sarah Craver, MD Follow up in 5 week(s).   Specialties: Vascular Surgery, Cardiology, Radiology, Vascular Surgery Why: follow up after procedure  vein mapping first access Contact information: [address] [place] [address] [phone]                Discharge Exam:   Subjective: Reports that she is unhappy with her home environment and not thrilled to go back but otherwise no specific concerns.   Objective: Vitals:   07/17/23 1142 07/17/23 1153  BP: (!) 140/73 139/80  Pulse: 78 82  Resp: (!) 26 16  Temp: 98 F (36.7 C) 98 F (36.7 C)  SpO2: 100% 100%    Intake/Output Summary (Last 24 hours) at 07/17/2023 1307 Last data filed at 07/17/2023 1153 Gross per 24 hour  Intake --  Output 0 ml  Net 0 ml   Filed Weights   07/17/23 0348 07/17/23 0750 07/17/23 1153  Weight: 63.4 kg 62.2 kg 62.3 kg    Exam:  General:  Appears calm and comfortable and is in NAD Eyes:  EOMI, normal lids, iris ENT:   grossly normal hearing, lips & tongue, mmm Neck:  no LAD, masses or thyromegaly Cardiovascular:  RRR. No LE edema.  Respiratory:   CTA bilaterally with no wheezes/rales/rhonchi.  Normal respiratory effort. Abdomen:  soft, NT, ND Skin:  no rash or induration seen on limited exam Musculoskeletal:  grossly normal tone BUE/BLE, good ROM, no bony abnormality Psychiatric:  blunted mood and affect, speech appropriate but with some hesitation, AOx3 Neurologic:  CN 2-12 grossly intact, moves all extremities in coordinated fashion  Data Reviewed: I have reviewed the patient's lab results since admission.  Pertinent labs for today include:  BUN 12/Creatinine 1.68/GFR 35 WBC 7.8 Hgb 10.8    Condition at discharge: stable  The results of significant diagnostics from this hospitalization (including imaging, microbiology, ancillary and laboratory) are listed below for reference.   Imaging Studies: DG Chest 1 View  Result Date: 07/14/2023 CLINICAL DATA:  End-stage renal disease EXAM: CHEST  1 VIEW COMPARISON:  03/31/2023, chest CT report 12/16/2019 FINDINGS: Right-sided central venous catheter tips at the SVC. 15 mm right lower lobe pulmonary  nodule as seen on multiple prior exams. No acute airspace disease. Stable cardiomediastinal silhouette. No pneumothorax IMPRESSION: 1. No active disease. 2. 15 mm right lower lobe pulmonary nodule as seen on multiple prior exams. Electronically Signed   By: Jasmine Pang M.D.   On: 07/14/2023 21:06   PERIPHERAL VASCULAR CATHETERIZATION  Result Date: 07/13/2023 See surgical note for result.  US RENAL  Result Date: 07/10/2023 CLINICAL DATA:  Acute kidney injury EXAM: RENAL / URINARY TRACT ULTRASOUND COMPLETE COMPARISON:  CT 07/09/2023 FINDINGS: Right Kidney: Renal measurements: 11.1 x 5.6 x 5.6 cm = volume: 180 mL. Echogenicity within normal limits. Simple 2.1 cm cyst at the inferior pole of the right kidney. No solid mass, shadowing stone, or hydronephrosis  visualized. Left Kidney: Renal measurements: 12.3 x 6.3 x 5.4 cm = volume: 217 mL. Echogenicity within normal limits. Simple 2.4 cm cyst at the interpolar region of the left kidney. No solid mass, shadowing stone, or hydronephrosis visualized. Bladder: Urinary bladder wall appears thickened, which may be accentuated by under-distension. Other: None. IMPRESSION: 1. No hydronephrosis. 2. Bilateral simple renal cysts which do not require follow-up imaging. 3. Urinary bladder wall appears thickened, which may be accentuated by under-distension. Correlate with urinalysis to exclude cystitis. Electronically Signed   By: Duanne Guess D.O.   On: 07/10/2023 11:53   CT ABDOMEN PELVIS WO CONTRAST  Result Date: 07/09/2023 CLINICAL DATA:  Status post cholecystectomy 3 weeks ago, presenting with abdominal tenderness on physical exam. EXAM: CT ABDOMEN AND PELVIS WITHOUT CONTRAST TECHNIQUE: Multidetector CT imaging of the abdomen and pelvis was performed following the standard protocol without IV contrast. RADIATION DOSE REDUCTION: This exam was performed according to the departmental dose-optimization program which includes automated exposure control, adjustment of the mA and/or kV according to patient size and/or use of iterative reconstruction technique. COMPARISON:  June 01, 2023 FINDINGS: Lower chest: A stable, benign 15 mm noncalcified lung nodule is seen within the posterior aspect of the right lung base. An additional stable 3 mm posterolateral right basilar lung nodule is noted (axial CT image 12, CT series 4). Hepatobiliary: No focal liver abnormality is seen. Status post cholecystectomy. No biliary dilatation. Pancreas: Unremarkable. No pancreatic ductal dilatation or surrounding inflammatory changes. Spleen: Normal in size without focal abnormality. Adrenals/Urinary Tract: Adrenal glands are unremarkable. Kidneys are normal in size, without renal calculi or hydronephrosis. A 1.7 cm diameter cyst is seen  within the posterolateral aspect of the mid to lower right kidney. An additional 2.0 cm diameter cyst is seen within the anterior aspect of the mid left kidney. The urinary bladder is poorly distended and subsequently limited in evaluation. Stomach/Bowel: Stomach is within normal limits. Appendix appears normal. No evidence of bowel wall thickening, distention, or inflammatory changes. Vascular/Lymphatic: Aortic atherosclerosis. No enlarged abdominal or pelvic lymph nodes. Reproductive: Status post hysterectomy. No adnexal masses. Other: A 2.1 cm x 1.8 cm x 3.0 cm fat containing ventral defect is seen at the level of the umbilicus. No associated fluid collection or inflammatory stranding is seen. No abdominopelvic ascites. Musculoskeletal: Chronic compression deformities are seen involving the T12 and L1 vertebral bodies. No acute osseous abnormalities are identified. IMPRESSION: 1. Evidence of prior cholecystectomy. 2. Stable bilateral renal cysts. No follow-up imaging is recommended. This recommendation follows ACR consensus guidelines: Management of the Incidental Renal Mass on CT: A White Paper of the ACR Incidental Findings Committee. J Am Coll Radiol 2018;15:264-273. 3. Fat-containing ventral hernia. 4. Chronic compression deformities involving the T12 and L1 vertebral bodies. 5.  Aortic atherosclerosis. Aortic Atherosclerosis (ICD10-I70.0). Electronically Signed   By: Aram Candela M.D.   On: 07/09/2023 22:25    Microbiology: Results for orders placed or performed during the hospital encounter of 07/09/23  Urine Culture (for pregnant, neutropenic or urologic patients or patients with an indwelling urinary catheter)     Status: Abnormal   Collection Time: 07/11/23  5:30 AM   Specimen: Urine, Clean Catch  Result Value Ref Range Status   Specimen Description   Final    URINE, CLEAN CATCH Performed at [facility], [address] [place], [place] [address]    Special Requests   Final     NONE Performed at [facility], [address] [place], [place] [address]    Culture >=100,000 COLONIES/mL KLEBSIELLA PNEUMONIAE (A)  Final   Report Status 07/13/2023 FINAL  Final   Organism ID, Bacteria KLEBSIELLA PNEUMONIAE (A)  Final      Susceptibility   Klebsiella pneumoniae - MIC*    AMPICILLIN RESISTANT Resistant     CEFAZOLIN <=4 SENSITIVE Sensitive     CEFEPIME <=0.12 SENSITIVE Sensitive     CEFTRIAXONE <=0.25 SENSITIVE Sensitive     CIPROFLOXACIN <=0.25 SENSITIVE Sensitive     GENTAMICIN <=1 SENSITIVE Sensitive     IMIPENEM <=0.25 SENSITIVE Sensitive     NITROFURANTOIN 32 SENSITIVE Sensitive     TRIMETH/SULFA <=20 SENSITIVE Sensitive     AMPICILLIN/SULBACTAM 4 SENSITIVE Sensitive     PIP/TAZO <=4 SENSITIVE Sensitive ug/mL    * >=100,000 COLONIES/mL KLEBSIELLA PNEUMONIAE    Labs: CBC: Recent Labs  Lab 07/11/23 0857 07/14/23 0622 07/15/23 0843 07/17/23 0547  WBC 9.5 8.7 10.0 7.8  NEUTROABS  --   --   --  4.2  HGB 11.2* 12.1 12.3 10.8*  HCT 31.1* 34.6* 36.1 30.6*  MCV 83.2 85.6 87.4 84.8  PLT 184 175 175 163   Basic Metabolic Panel: Recent Labs  Lab 07/13/23 0517 07/14/23 0622 07/15/23 0559 07/16/23 0601 07/17/23 0547  NA 137 136 135 134* 135  K 3.2* 4.0 3.3* 3.4* 3.6  CL 92* 95* 98 99 100  CO2 31 28 26 24 25   GLUCOSE 107* 113* 92 83 95  BUN 59* 29* 17 10 12   CREATININE 6.34* 3.59* 2.31* 1.63* 1.68*  CALCIUM 7.6* 7.6* 8.2* 8.6* 9.1  MG  --   --  1.3* 2.0 1.9  PHOS 2.2* 2.1* 2.3* 1.8* 2.3*   Liver Function Tests: Recent Labs  Lab 07/12/23 0217 07/13/23 0517 07/14/23 0622 07/15/23 0559 07/16/23 0601  ALBUMIN 3.1* 3.1* 3.4* 3.1* 3.1*   CBG: Recent Labs  Lab 07/13/23 1629 07/13/23 2121 07/14/23 0831 07/14/23 1150 07/14/23 1601  GLUCAP 121* 115* 90 88 105*    Discharge time spent: greater than 30 minutes.  Signed: Jonah Blue, MD [facility] 07/17/2023

## 2023-07-19 LAB — PROTEIN ELECTROPHORESIS, SERUM
A/G Ratio: 1.2 (ref 0.7–1.7)
Albumin ELP: 3.2 g/dL (ref 2.9–4.4)
Alpha-1-Globulin: 0.2 g/dL (ref 0.0–0.4)
Alpha-2-Globulin: 0.7 g/dL (ref 0.4–1.0)
Beta Globulin: 1 g/dL (ref 0.7–1.3)
Gamma Globulin: 0.8 g/dL (ref 0.4–1.8)
Globulin, Total: 2.7 g/dL (ref 2.2–3.9)
Total Protein ELP: 5.9 g/dL — ABNORMAL LOW (ref 6.0–8.5)

## 2023-08-07 ENCOUNTER — Emergency Department: Payer: Medicaid Other

## 2023-08-07 ENCOUNTER — Other Ambulatory Visit: Payer: Self-pay

## 2023-08-07 ENCOUNTER — Inpatient Hospital Stay
Admission: EM | Admit: 2023-08-07 | Discharge: 2023-08-17 | DRG: 689 | Disposition: A | Discharge status: Skilled Nursing Facility | Payer: Medicaid Other | Attending: Internal Medicine | Admitting: Internal Medicine

## 2023-08-07 DIAGNOSIS — E1165 Type 2 diabetes mellitus with hyperglycemia: Secondary | ICD-10-CM | POA: Diagnosis present

## 2023-08-07 DIAGNOSIS — N2581 Secondary hyperparathyroidism of renal origin: Secondary | ICD-10-CM | POA: Diagnosis present

## 2023-08-07 DIAGNOSIS — D631 Anemia in chronic kidney disease: Secondary | ICD-10-CM | POA: Diagnosis present

## 2023-08-07 DIAGNOSIS — E876 Hypokalemia: Secondary | ICD-10-CM | POA: Diagnosis not present

## 2023-08-07 DIAGNOSIS — N390 Urinary tract infection, site not specified: Secondary | ICD-10-CM | POA: Diagnosis present

## 2023-08-07 DIAGNOSIS — Z886 Allergy status to analgesic agent status: Secondary | ICD-10-CM

## 2023-08-07 DIAGNOSIS — N3091 Cystitis, unspecified with hematuria: Principal | ICD-10-CM | POA: Diagnosis present

## 2023-08-07 DIAGNOSIS — J45909 Unspecified asthma, uncomplicated: Secondary | ICD-10-CM | POA: Diagnosis present

## 2023-08-07 DIAGNOSIS — K219 Gastro-esophageal reflux disease without esophagitis: Secondary | ICD-10-CM | POA: Diagnosis present

## 2023-08-07 DIAGNOSIS — Z8543 Personal history of malignant neoplasm of ovary: Secondary | ICD-10-CM

## 2023-08-07 DIAGNOSIS — Z6825 Body mass index (BMI) 25.0-25.9, adult: Secondary | ICD-10-CM

## 2023-08-07 DIAGNOSIS — A4150 Gram-negative sepsis, unspecified: Secondary | ICD-10-CM

## 2023-08-07 DIAGNOSIS — Z820 Family history of epilepsy and other diseases of the nervous system: Secondary | ICD-10-CM

## 2023-08-07 DIAGNOSIS — K529 Noninfective gastroenteritis and colitis, unspecified: Secondary | ICD-10-CM | POA: Diagnosis present

## 2023-08-07 DIAGNOSIS — I2510 Atherosclerotic heart disease of native coronary artery without angina pectoris: Secondary | ICD-10-CM | POA: Diagnosis present

## 2023-08-07 DIAGNOSIS — E43 Unspecified severe protein-calorie malnutrition: Secondary | ICD-10-CM | POA: Diagnosis present

## 2023-08-07 DIAGNOSIS — I4719 Other supraventricular tachycardia: Secondary | ICD-10-CM | POA: Diagnosis present

## 2023-08-07 DIAGNOSIS — Z885 Allergy status to narcotic agent status: Secondary | ICD-10-CM

## 2023-08-07 DIAGNOSIS — I700 Atherosclerosis of aorta: Secondary | ICD-10-CM | POA: Diagnosis present

## 2023-08-07 DIAGNOSIS — Z888 Allergy status to other drugs, medicaments and biological substances status: Secondary | ICD-10-CM

## 2023-08-07 DIAGNOSIS — E785 Hyperlipidemia, unspecified: Secondary | ICD-10-CM | POA: Diagnosis present

## 2023-08-07 DIAGNOSIS — Y838 Other surgical procedures as the cause of abnormal reaction of the patient, or of later complication, without mention of misadventure at the time of the procedure: Secondary | ICD-10-CM | POA: Diagnosis present

## 2023-08-07 DIAGNOSIS — G40909 Epilepsy, unspecified, not intractable, without status epilepticus: Secondary | ICD-10-CM | POA: Diagnosis present

## 2023-08-07 DIAGNOSIS — I428 Other cardiomyopathies: Secondary | ICD-10-CM | POA: Diagnosis present

## 2023-08-07 DIAGNOSIS — I10 Essential (primary) hypertension: Secondary | ICD-10-CM | POA: Diagnosis present

## 2023-08-07 DIAGNOSIS — N186 End stage renal disease: Secondary | ICD-10-CM | POA: Diagnosis present

## 2023-08-07 DIAGNOSIS — I5084 End stage heart failure: Secondary | ICD-10-CM | POA: Diagnosis present

## 2023-08-07 DIAGNOSIS — K5289 Other specified noninfective gastroenteritis and colitis: Secondary | ICD-10-CM | POA: Diagnosis present

## 2023-08-07 DIAGNOSIS — Z992 Dependence on renal dialysis: Secondary | ICD-10-CM

## 2023-08-07 DIAGNOSIS — R112 Nausea with vomiting, unspecified: Secondary | ICD-10-CM | POA: Diagnosis present

## 2023-08-07 DIAGNOSIS — N1832 Chronic kidney disease, stage 3b: Secondary | ICD-10-CM | POA: Insufficient documentation

## 2023-08-07 DIAGNOSIS — N170 Acute kidney failure with tubular necrosis: Secondary | ICD-10-CM | POA: Diagnosis present

## 2023-08-07 DIAGNOSIS — N3090 Cystitis, unspecified without hematuria: Principal | ICD-10-CM

## 2023-08-07 DIAGNOSIS — Z8249 Family history of ischemic heart disease and other diseases of the circulatory system: Secondary | ICD-10-CM

## 2023-08-07 DIAGNOSIS — Z7982 Long term (current) use of aspirin: Secondary | ICD-10-CM

## 2023-08-07 DIAGNOSIS — E1122 Type 2 diabetes mellitus with diabetic chronic kidney disease: Secondary | ICD-10-CM | POA: Diagnosis present

## 2023-08-07 DIAGNOSIS — E440 Moderate protein-calorie malnutrition: Secondary | ICD-10-CM | POA: Insufficient documentation

## 2023-08-07 DIAGNOSIS — I132 Hypertensive heart and chronic kidney disease with heart failure and with stage 5 chronic kidney disease, or end stage renal disease: Secondary | ICD-10-CM | POA: Diagnosis present

## 2023-08-07 DIAGNOSIS — I5022 Chronic systolic (congestive) heart failure: Secondary | ICD-10-CM | POA: Diagnosis present

## 2023-08-07 DIAGNOSIS — T829XXA Unspecified complication of cardiac and vascular prosthetic device, implant and graft, initial encounter: Secondary | ICD-10-CM | POA: Diagnosis present

## 2023-08-07 LAB — COMPREHENSIVE METABOLIC PANEL
ALT: 15 U/L (ref 0–44)
AST: 20 U/L (ref 15–41)
Albumin: 3.7 g/dL (ref 3.5–5.0)
Alkaline Phosphatase: 76 U/L (ref 38–126)
Anion gap: 13 (ref 5–15)
BUN: 27 mg/dL — ABNORMAL HIGH (ref 6–20)
CO2: 27 mmol/L (ref 22–32)
Calcium: 10.2 mg/dL (ref 8.9–10.3)
Chloride: 97 mmol/L — ABNORMAL LOW (ref 98–111)
Creatinine, Ser: 1.78 mg/dL — ABNORMAL HIGH (ref 0.44–1.00)
GFR, Estimated: 32 mL/min — ABNORMAL LOW (ref >60)
Glucose, Bld: 312 mg/dL — ABNORMAL HIGH (ref 70–99)
Potassium: 2.7 mmol/L — CL (ref 3.5–5.1)
Sodium: 137 mmol/L (ref 135–145)
Total Bilirubin: 1.2 mg/dL — ABNORMAL HIGH (ref <1.2)
Total Protein: 7 g/dL (ref 6.5–8.1)

## 2023-08-07 LAB — URINALYSIS, ROUTINE W REFLEX MICROSCOPIC
Bilirubin Urine: NEGATIVE
Glucose, UA: >=500 mg/dL — AB
Ketones, ur: NEGATIVE mg/dL
Nitrite: NEGATIVE
Protein, ur: 100 mg/dL — AB
Specific Gravity, Urine: >1.046 — ABNORMAL HIGH (ref 1.005–1.030)
Squamous Epithelial / HPF: 0 /[HPF] (ref 0–5)
WBC, UA: >50 WBC/hpf (ref 0–5)
pH: 5 (ref 5.0–8.0)

## 2023-08-07 LAB — CBC
HCT: 35.4 % — ABNORMAL LOW (ref 36.0–46.0)
Hemoglobin: 12.3 g/dL (ref 12.0–15.0)
MCH: 30.7 pg (ref 26.0–34.0)
MCHC: 34.7 g/dL (ref 30.0–36.0)
MCV: 88.3 fL (ref 80.0–100.0)
Platelets: 312 10*3/uL (ref 150–400)
RBC: 4.01 MIL/uL (ref 3.87–5.11)
RDW: 14 % (ref 11.5–15.5)
WBC: 15.7 10*3/uL — ABNORMAL HIGH (ref 4.0–10.5)
nRBC: 0 % (ref 0.0–0.2)

## 2023-08-07 LAB — C DIFFICILE QUICK SCREEN W PCR REFLEX
C Diff antigen: NEGATIVE
C Diff interpretation: NOT DETECTED
C Diff toxin: NEGATIVE

## 2023-08-07 LAB — LIPASE, BLOOD: Lipase: 35 U/L (ref 11–51)

## 2023-08-07 MED ORDER — ENSURE ENLIVE PO LIQD
237.0000 mL | Freq: Two times a day (BID) | ORAL | Status: DC
  Administered 2023-08-08 (×2): 237 mL via ORAL

## 2023-08-07 MED ORDER — IOHEXOL 350 MG/ML SOLN
50.0000 mL | Freq: Once | INTRAVENOUS | Status: AC | PRN
  Administered 2023-08-07: 50 mL via INTRAVENOUS

## 2023-08-07 MED ORDER — METRONIDAZOLE 500 MG/100ML IV SOLN
500.0000 mg | Freq: Once | INTRAVENOUS | Status: AC
Start: 2023-08-07 — End: 2023-08-07
  Administered 2023-08-07: 500 mg via INTRAVENOUS
  Filled 2023-08-07: qty 100

## 2023-08-07 MED ORDER — METRONIDAZOLE 500 MG/100ML IV SOLN
500.0000 mg | Freq: Two times a day (BID) | INTRAVENOUS | Status: DC
  Administered 2023-08-08 – 2023-08-09 (×4): 500 mg via INTRAVENOUS
  Filled 2023-08-07 (×6): qty 100

## 2023-08-07 MED ORDER — POTASSIUM CHLORIDE 10 MEQ/100ML IV SOLN
10.0000 meq | INTRAVENOUS | Status: AC
Start: 2023-08-07 — End: 2023-08-07
  Administered 2023-08-07: 10 meq via INTRAVENOUS
  Filled 2023-08-07 (×2): qty 100

## 2023-08-07 MED ORDER — LOPERAMIDE HCL 2 MG PO CAPS
2.0000 mg | ORAL_CAPSULE | ORAL | Status: DC | PRN
  Administered 2023-08-08: 2 mg via ORAL
  Filled 2023-08-07: qty 1

## 2023-08-07 MED ORDER — SODIUM CHLORIDE 0.9 % IV BOLUS
1000.0000 mL | Freq: Once | INTRAVENOUS | Status: AC
  Administered 2023-08-07: 1000 mL via INTRAVENOUS

## 2023-08-07 MED ORDER — SODIUM CHLORIDE 0.9 % IV SOLN
2.0000 g | Freq: Once | INTRAVENOUS | Status: AC
  Administered 2023-08-07: 2 g via INTRAVENOUS
  Filled 2023-08-07: qty 20

## 2023-08-07 MED ORDER — PANTOPRAZOLE SODIUM 40 MG PO TBEC
40.0000 mg | DELAYED_RELEASE_TABLET | Freq: Every day | ORAL | Status: DC
  Administered 2023-08-08 – 2023-08-17 (×10): 40 mg via ORAL
  Filled 2023-08-07 (×10): qty 1

## 2023-08-07 MED ORDER — POTASSIUM CHLORIDE 20 MEQ PO PACK
20.0000 meq | PACK | ORAL | Status: AC
  Administered 2023-08-07: 20 meq via ORAL
  Filled 2023-08-07: qty 1

## 2023-08-07 MED ORDER — LACTATED RINGERS IV SOLN
150.0000 mL/h | INTRAVENOUS | Status: DC
  Administered 2023-08-08: 150 mL/h via INTRAVENOUS

## 2023-08-07 MED ORDER — TRAZODONE HCL 50 MG PO TABS
25.0000 mg | ORAL_TABLET | Freq: Every evening | ORAL | Status: DC | PRN
  Filled 2023-08-07 (×2): qty 1

## 2023-08-07 MED ORDER — SODIUM CHLORIDE 0.9 % IV SOLN
2.0000 g | INTRAVENOUS | Status: DC
  Administered 2023-08-08 – 2023-08-09 (×2): 2 g via INTRAVENOUS
  Filled 2023-08-07 (×2): qty 20

## 2023-08-07 MED ORDER — ACETAMINOPHEN 325 MG PO TABS: 650.0000 mg | ORAL_TABLET | Freq: Four times a day (QID) | ORAL | Status: DC | PRN

## 2023-08-07 MED ORDER — METOPROLOL SUCCINATE ER 25 MG PO TB24
12.5000 mg | ORAL_TABLET | Freq: Every day | ORAL | Status: DC
  Administered 2023-08-08 – 2023-08-17 (×10): 12.5 mg via ORAL
  Filled 2023-08-07 (×10): qty 1

## 2023-08-07 MED ORDER — ONDANSETRON HCL 4 MG/2ML IJ SOLN
4.0000 mg | Freq: Once | INTRAMUSCULAR | Status: AC
  Administered 2023-08-07: 4 mg via INTRAVENOUS
  Filled 2023-08-07: qty 2

## 2023-08-07 MED ORDER — ATORVASTATIN CALCIUM 80 MG PO TABS
80.0000 mg | ORAL_TABLET | Freq: Every day | ORAL | Status: DC
Start: 2023-08-08 — End: 2023-08-17
  Administered 2023-08-10 – 2023-08-17 (×8): 80 mg via ORAL
  Filled 2023-08-07 (×9): qty 1

## 2023-08-07 MED ORDER — ACETAMINOPHEN 650 MG RE SUPP: 650.0000 mg | Freq: Four times a day (QID) | RECTAL | Status: DC | PRN

## 2023-08-07 MED ORDER — RENA-VITE PO TABS
1.0000 | ORAL_TABLET | Freq: Every day | ORAL | Status: DC
  Administered 2023-08-08 – 2023-08-16 (×10): 1 via ORAL
  Filled 2023-08-07 (×10): qty 1

## 2023-08-07 MED ORDER — ENOXAPARIN SODIUM 30 MG/0.3ML IJ SOSY
30.0000 mg | PREFILLED_SYRINGE | INTRAMUSCULAR | Status: DC
  Administered 2023-08-08 – 2023-08-09 (×2): 30 mg via SUBCUTANEOUS
  Filled 2023-08-07 (×2): qty 0.3

## 2023-08-07 MED ORDER — ONDANSETRON HCL 4 MG/2ML IJ SOLN: 4.0000 mg | Freq: Four times a day (QID) | INTRAMUSCULAR | Status: DC | PRN

## 2023-08-07 MED ORDER — PANTOPRAZOLE SODIUM 40 MG IV SOLR
40.0000 mg | Freq: Once | INTRAVENOUS | Status: AC
  Administered 2023-08-07: 40 mg via INTRAVENOUS
  Filled 2023-08-07: qty 10

## 2023-08-07 MED ORDER — ONDANSETRON HCL 4 MG PO TABS: 4.0000 mg | ORAL_TABLET | Freq: Four times a day (QID) | ORAL | Status: DC | PRN

## 2023-08-07 MED ORDER — ASPIRIN 81 MG PO CHEW
81.0000 mg | CHEWABLE_TABLET | Freq: Every day | ORAL | Status: DC
  Administered 2023-08-08 – 2023-08-17 (×10): 81 mg via ORAL
  Filled 2023-08-07 (×10): qty 1

## 2023-08-07 NOTE — Assessment & Plan Note (Addendum)
-   We will continue antihypertensive therapy. - Will hold off nephrotoxins.

## 2023-08-07 NOTE — Assessment & Plan Note (Signed)
-   This could be associated with enterocolitis. - Management as above. - As needed antiemetics and antidiarrheals will be provided. - This could be contributing to her mild sepsis.

## 2023-08-07 NOTE — Assessment & Plan Note (Addendum)
-   The patient be admitted to a medical telemetry observation bed. - Sepsis is manifested by leukocytosis, tachycardia and tachypnea. - We will continue antibiotic therapy with IV Rocephin. - We will follow blood and urine cultures. - We will continue addition with IV lactated ringer.

## 2023-08-07 NOTE — ED Notes (Signed)
Pt not tolerating potassium infusion. Provider at bedside and aware. Potassium d/c at this time with 27 ml left in bag. We will attempt PO potassium

## 2023-08-07 NOTE — ED Triage Notes (Addendum)
Pt c/o n/v/d x 3 day. Denies fever, chills, abd pain. Dialysis pt - normal days are Tuesday & Saturday. This week was Thursday & Saturday, however she was unable to complete dialysis today d/t being sick.

## 2023-08-07 NOTE — ED Notes (Signed)
First nurse note: Pt to ED from [facility] for NVD since 2 days Did not get any dialysis treatment  Pt has diarrhea on legs and under fingernails per EMS  130/105, 100% RA, 108 HR

## 2023-08-07 NOTE — Assessment & Plan Note (Signed)
-   The patient will be placed on supplemental coverage with NovoLog. - We will hold off metformin for now.

## 2023-08-07 NOTE — ED Notes (Signed)
Pt returned from CT scan. IV fluids and potassium reconnected. Pt denies any needs. Cb within reach.  Pt encouraged to alert staff to any needs.

## 2023-08-07 NOTE — H&P (Signed)
[place]   PATIENT NAME: Sarah Lane    MR#:  [id number]  DATE OF BIRTH:  [birth year]  DATE OF ADMISSION:  08/07/2023  PRIMARY CARE PHYSICIAN: [facility], [facility]   Patient is coming from: Home  REQUESTING/REFERRING PHYSICIAN: Alfonse Flavors, MD  CHIEF COMPLAINT:   Chief Complaint  Patient presents with   Nausea   Diarrhea    HISTORY OF PRESENT ILLNESS:  Sarah Lane is a 57 y.o. female with medical history significant for asthma, type diabetes mellitus, hypertension, PSVT, seizure disorder and ovarian cancer status post TAH and BSO, who presented to the ER with acute onset of recurrent nausea, vomiting with diarrhea over the last week.  She denied any bilious vomitus or hematemesis.  No melena or bright red bleeding per rectum.  She admitted to abdominal cramps associated with her symptoms.  She has been having urinary urgency and bilateral flank pain without dysuria or hematuria.  No cough or wheezing or dyspnea.  No chest pain or palpitations.  No fever or chills.  ED Course: When the patient came to the ER, temperature was 97.5 and heart rate 104 and later on respiratory it was 26 with otherwise normal vital signs.  Labs revealed hypokalemia of 2.7 and hyperglycemia of 312, BUN of 27 and creatinine 1.78 higher than earlier this month but close to baseline with total bili 1.2.  CBC showed leukocytosis of 15.7.  UA was positive for UTI. EKG as reviewed by me : None Imaging: Abdominal and pelvic CT scan showed the following: 1. Mildly dilated rectum contains large volume stool and demonstrates mild circumferential mural thickening, which may be seen in the setting of stercoral colitis. 2. Mild circumferential mural thickening of the urinary bladder, which may be seen in the setting of cystitis. Recommend correlation with urinalysis. 3. Aortic Atherosclerosis (ICD10-I70.0). Coronary artery calcifications. Assessment for potential risk factor modification, dietary  therapy or pharmacologic therapy may be warranted, if clinically indicated.  The patient was given IV Rocephin and Flagyl, 4 mg of IV Zofran, 40 mg of IV Protonix, 20 equivalent p.o. potassium chloride and 10 mill equivalent IV potassium chloride as well as 1 L bolus of IV normal saline.  She will be admitted to a medical telemetry observation bed for further evaluation and management. PAST MEDICAL HISTORY:   Past Medical History:  Diagnosis Date   Aortic atherosclerosis (HCC)    a. 03/2023 noted on CT.   Asthma    childhood asthma   Cancer (HCC)    ovarian cancer   Cardiomyopathy (HCC)    a. 01/2023 [facility]): EF 15-20%, impaired relaxation, mildly to mod dil RV w/ mild RV dysfxn, mod PH, BAE; b. 03/2023 Echo: EF 30-35%, glob HK, GrI DD, mod reduced RV fxn, mod enlarged RV, RVSP 42.8mmHg, mild MR, mild-mod TR.   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 Dx 01/2023-->[facility]: EF 15-20%; b. 03/2023 Echo: EF 30-35%.   Diabetes (HCC)    Diabetes mellitus without complication (HCC)    Expressive aphasia    a. 01/2023 L MCA occlusive stroke s/p thrombectomy.   Family history of adverse reaction to anesthesia    Grand father had facial swelling   H/O ischemic left MCA stroke    a. 01/2023 s/p admission @ [facility]. CTA Head w/ M1 occlusion of L MCA, possible high-grade stenosis of distal A3 segment. S/p thrombectomy.   Hypertension  PSVT (paroxysmal supraventricular tachycardia) (HCC)    Right lower lobe pulmonary nodule    a. 03/2023 CT Chest - 14mm - stable.   Seizures (HCC)    petit mals at age 69 treated with medications. Last one age 7    PAST SURGICAL HISTORY:   Past Surgical History:  Procedure Laterality Date   ABDOMINAL HYSTERECTOMY  2011   ovarian cancer   DIALYSIS/PERMA CATHETER INSERTION N/A 07/13/2023   Procedure: DIALYSIS/PERMA CATHETER INSERTION;  Surgeon: Renford Dills, MD;  Location: [facility]  INVASIVE CV LAB;  Service: Cardiovascular;  Laterality: N/A;   IRRIGATION AND DEBRIDEMENT ABDOMEN N/A 12/31/2014   Procedure: IRRIGATION AND DEBRIDEMENT ABDOMINAL WALL ABSCESS;  Surgeon: Luretha Murphy, MD;  Location: [facility] ORS;  Service: General;  Laterality: N/A;   LYSIS OF ADHESION  06/23/2023   Procedure: LYSIS OF ADHESION;  Surgeon: Campbell Lerner, MD;  Location: [facility] ORS;  Service: General;;   RIGHT/LEFT HEART CATH AND CORONARY ANGIOGRAPHY N/A 04/06/2023   Procedure: RIGHT/LEFT HEART CATH AND CORONARY ANGIOGRAPHY;  Surgeon: Yvonne Kendall, MD;  Location: [facility] INVASIVE CV LAB;  Service: Cardiovascular;  Laterality: N/A;    SOCIAL HISTORY:   Social History   Tobacco Use   Smoking status: Never    Passive exposure: Never   Smokeless tobacco: Not on file  Substance Use Topics   Alcohol use: No    FAMILY HISTORY:   Family History  Problem Relation Age of Onset   CAD Father        s/p CABG x 2 and stenting   Parkinson's disease Father     DRUG ALLERGIES:   Allergies  Allergen Reactions   Jardiance [Empagliflozin]     Multiple side effect complications including: DKA, pancreatitis, renal failure. Avoid re-prescribing   Codeine Nausea And Vomiting   Ibuprofen Nausea And Vomiting    REVIEW OF SYSTEMS:   ROS As per history of present illness. All pertinent systems were reviewed above. Constitutional, HEENT, cardiovascular, respiratory, GI, GU, musculoskeletal, neuro, psychiatric, endocrine, integumentary and hematologic systems were reviewed and are otherwise negative/unremarkable except for positive findings mentioned above in the HPI.   MEDICATIONS AT HOME:   Prior to Admission medications   Medication Sig Start Date End Date Taking? Authorizing Provider  aspirin 81 MG chewable tablet Chew 81 mg by mouth in the morning.    [provider]  atorvastatin (LIPITOR) 80 MG tablet Take 80 mg by mouth in the morning.    [provider]  feeding supplement  (ENSURE ENLIVE / ENSURE PLUS) LIQD Take 237 mLs by mouth 2 (two) times daily between meals. 07/17/23   Jonah Blue, MD  metoprolol succinate (TOPROL-XL) 25 MG 24 hr tablet Take 12.5 mg by mouth in the morning.    [provider]  multivitamin (RENA-VIT) TABS tablet Take 1 tablet by mouth at bedtime. 07/17/23   Jonah Blue, MD  omeprazole (PRILOSEC) 40 MG capsule Take 1 capsule (40 mg total) by mouth daily. 06/04/23 07/09/23  Lucile Shutters, MD      VITAL SIGNS:  Blood pressure (!) 103/59, pulse 82, temperature 98.1 F (36.7 C), temperature source Oral, resp. rate 18, height 5' (1.524 m), weight 58.1 kg, SpO2 99%.  PHYSICAL EXAMINATION:  Physical Exam  GENERAL:  57 y.o.-year-old female patient lying in the bed with no acute distress.  EYES: Pupils equal, round, reactive to light and accommodation. No scleral icterus. Extraocular muscles intact.  HEENT: Head atraumatic, normocephalic. Oropharynx and nasopharynx clear.  NECK:  Supple, no jugular  venous distention. No thyroid enlargement, no tenderness.  LUNGS: Normal breath sounds bilaterally, no wheezing, rales,rhonchi or crepitation. No use of accessory muscles of respiration.  CARDIOVASCULAR: Regular rate and rhythm, S1, S2 normal. No murmurs, rubs, or gallops.  ABDOMEN: Soft, nondistended, with generalized abdominal tenderness without rebound tenderness guarding or rigidity.  Bowel sounds present. No organomegaly or mass.  EXTREMITIES: No pedal edema, cyanosis, or clubbing.  NEUROLOGIC: Cranial nerves II through XII are intact. Muscle strength 5/5 in all extremities. Sensation intact. Gait not checked.  PSYCHIATRIC: The patient is alert and oriented x 3.  Normal affect and good eye contact. SKIN: No obvious rash, lesion, or ulcer.   LABORATORY PANEL:   CBC Recent Labs  Lab 08/07/23 1435  WBC 15.7*  HGB 12.3  HCT 35.4*  PLT 312    ------------------------------------------------------------------------------------------------------------------  Chemistries  Recent Labs  Lab 08/07/23 1435  NA 137  K 2.7*  CL 97*  CO2 27  GLUCOSE 312*  BUN 27*  CREATININE 1.78*  CALCIUM 10.2  AST 20  ALT 15  ALKPHOS 76  BILITOT 1.2*   ------------------------------------------------------------------------------------------------------------------  Cardiac Enzymes No results for input(s): "TROPONINI" in the last 168 hours. ------------------------------------------------------------------------------------------------------------------  RADIOLOGY:  CT ABDOMEN PELVIS W CONTRAST Result Date: 08/07/2023 CLINICAL DATA:  Two day history of nausea, vomiting, and diarrhea EXAM: CT ABDOMEN AND PELVIS WITH CONTRAST TECHNIQUE: Multidetector CT imaging of the abdomen and pelvis was performed using the standard protocol following bolus administration of intravenous contrast. RADIATION DOSE REDUCTION: This exam was performed according to the departmental dose-optimization program which includes automated exposure control, adjustment of the mA and/or kV according to patient size and/or use of iterative reconstruction technique. CONTRAST:  50mL OMNIPAQUE IOHEXOL 350 MG/ML SOLN COMPARISON:  CT abdomen and pelvis dated 07/09/2023 and multiple priors including nuclear medicine PET dated 01/16/2020 FINDINGS: Lower chest: Partially imaged central venous catheter tip terminates in the right atrium. Unchanged non radiotracer avid 1.4 x 1.3 cm right lower lobe nodule. No pleural effusion or pneumothorax demonstrated. Partially imaged heart size is normal. Coronary artery calcifications. Hepatobiliary: No focal hepatic lesions. No intra or extrahepatic biliary ductal dilation. Cholecystectomy. Pancreas: No focal lesions or main ductal dilation. Spleen: Normal in size without focal abnormality. Adrenals/Urinary Tract: No adrenal nodules. No suspicious  renal mass, calculi or hydronephrosis. Mild circumferential mural thickening of the urinary bladder. Stomach/Bowel: Normal appearance of the stomach. Mildly dilated rectum contains large volume stool and demonstrates mild circumferential mural thickening. Normal appendix. Vascular/Lymphatic: Aortic atherosclerosis. No enlarged abdominal or pelvic lymph nodes. Reproductive: No adnexal masses. Other: Mild presacral edema.  No free air or fluid collection. Musculoskeletal: No acute or abnormal lytic or blastic osseous lesions. Multilevel degenerative changes of the partially imaged thoracic and lumbar spine. Unchanged anterior wedging of T12 and L1 and superior endplate deformity of T11. Small paraumbilical hernia contains fat and a loop of nonobstructed small bowel. IMPRESSION: 1. Mildly dilated rectum contains large volume stool and demonstrates mild circumferential mural thickening, which may be seen in the setting of stercoral colitis. 2. Mild circumferential mural thickening of the urinary bladder, which may be seen in the setting of cystitis. Recommend correlation with urinalysis. 3. Aortic Atherosclerosis (ICD10-I70.0). Coronary artery calcifications. Assessment for potential risk factor modification, dietary therapy or pharmacologic therapy may be warranted, if clinically indicated. Electronically Signed   By: Agustin Cree M.D.   On: 08/07/2023 19:33      IMPRESSION AND PLAN:  Assessment and Plan: * Sepsis due to gram-negative UTI (HCC) -  The patient be admitted to a medical telemetry observation bed. - Sepsis is manifested by leukocytosis, tachycardia and tachypnea. - We will continue antibiotic therapy with IV Rocephin. - We will follow blood and urine cultures. - We will continue addition with IV lactated ringer.  Acute gastroenteritis - This could be associated with enterocolitis. - Management as above. - As needed antiemetics and antidiarrheals will be provided. - This could be contributing  to her mild sepsis.  Uncontrolled type 2 diabetes mellitus with hyperglycemia, without long-term current use of insulin (HCC) - The patient will be placed on supplemental coverage with NovoLog. - We will hold off metformin for now.  Hypokalemia - Potassium will be replaced and magnesium level will be checked.  Essential hypertension - We will continue antihypertensive therapy. - Will hold off nephrotoxins.  Dyslipidemia - We will continue statin therapy.  Stage 3b chronic kidney disease (CKD) (HCC) - She may be having mild AKI though her creatinine is comparable to previous levels earlier this month. - She will be hydrated as mentioned above and will follow BMPs.  GERD without esophagitis - We will continue PPI therapy.   DVT prophylaxis: Lovenox.  Advanced Care Planning:  Code Status: full code.  Family Communication:  The plan of care was discussed in details with the patient (and family). I answered all questions. The patient agreed to proceed with the above mentioned plan. Further management will depend upon hospital course. Disposition Plan: Back to previous home environment Consults called: none.  All the records are reviewed and case discussed with ED provider.  Status is: Observation  I certify that at the time of admission, it is my clinical judgment that the patient will require hospital care extending less than 2 midnights.                            Dispo: The patient is from: Home              Anticipated d/c is to: Home              Patient currently is not medically stable to d/c.              Difficult to place patient: No  Hannah Beat M.D on 08/08/2023 at 12:02 AM  [facility]   From 7 PM-7 AM, contact night-coverage [email or web address]  CC: Primary care physician; Care, [facility]

## 2023-08-07 NOTE — Assessment & Plan Note (Signed)
-   We will continue PPI therapy 

## 2023-08-07 NOTE — Assessment & Plan Note (Signed)
-   We will continue statin therapy. 

## 2023-08-07 NOTE — ED Provider Notes (Signed)
[facility] Provider Note    Event Date/Time   First MD Initiated Contact with Patient 08/07/23 1801     (approximate)   History   Chief Complaint: Nausea and Diarrhea   HPI  Sarah Lane is a 57 y.o. female with a history of diabetes, hypertension, heart failure who comes ED complaining of generalized abdominal pain with nausea vomiting and diarrhea for the past 3 days.  Denies fever.  Reports she has been unable to eat for the last several days but has been able to drink small amounts of liquids.  Was due for dialysis today but was unable to complete session due to illness.          Physical Exam   Triage Vital Signs: ED Triage Vitals  Encounter Vitals Group     BP 08/07/23 1428 104/69     Systolic BP Percentile --      Diastolic BP Percentile --      Pulse Rate 08/07/23 1428 (!) 104     Resp 08/07/23 1428 17     Temp 08/07/23 1428 (!) 97.5 F (36.4 C)     Temp Source 08/07/23 1428 Oral     SpO2 08/07/23 1428 97 %     Weight 08/07/23 1431 128 lb (58.1 kg)     Height 08/07/23 1431 5' (1.524 m)     Head Circumference --      Peak Flow --      Pain Score 08/07/23 1431 0     Pain Loc --      Pain Education --      Exclude from Growth Chart --     Most recent vital signs: Vitals:   08/07/23 2130 08/07/23 2200  BP: (!) 95/54 (!) 103/59  Pulse: 81 82  Resp:    Temp:    SpO2: 95% 99%    General: Awake, no distress.  CV:  Good peripheral perfusion.  Regular rate and rhythm Resp:  Normal effort.  Clear to auscultation bilaterally Abd:  No distention.  Soft with diffuse left-sided tenderness Other:  Dry oral mucosa.  No lower extremity edema.   ED Results / Procedures / Treatments   Labs (all labs ordered are listed, but only abnormal results are displayed) Labs Reviewed  COMPREHENSIVE METABOLIC PANEL - Abnormal; Notable for the following components:      Result Value   Potassium 2.7 (*)    Chloride 97 (*)    Glucose, Bld 312  (*)    BUN 27 (*)    Creatinine, Ser 1.78 (*)    Total Bilirubin 1.2 (*)    GFR, Estimated 32 (*)    All other components within normal limits  CBC - Abnormal; Notable for the following components:   WBC 15.7 (*)    HCT 35.4 (*)    All other components within normal limits  URINALYSIS, ROUTINE W REFLEX MICROSCOPIC - Abnormal; Notable for the following components:   Color, Urine YELLOW (*)    APPearance TURBID (*)    Specific Gravity, Urine >1.046 (*)    Glucose, UA >=500 (*)    Hgb urine dipstick LARGE (*)    Protein, ur 100 (*)    Leukocytes,Ua LARGE (*)    Bacteria, UA RARE (*)    All other components within normal limits  C DIFFICILE QUICK SCREEN W PCR REFLEX    LIPASE, BLOOD  PROTIME-INR  CORTISOL-AM, BLOOD  BASIC METABOLIC PANEL  CBC     EKG  RADIOLOGY CT abdomen pelvis interpreted by me, shows large stool in the rectum.  No free air.  Radiology report reviewed   PROCEDURES:  Procedures   MEDICATIONS ORDERED IN ED: Medications  potassium chloride 10 mEq in 100 mL IVPB (0 mEq Intravenous Stopped 08/07/23 2143)  metroNIDAZOLE (FLAGYL) IVPB 500 mg (500 mg Intravenous New Bag/Given 08/07/23 2248)  potassium chloride (KLOR-CON) packet 20 mEq (has no administration in time range)  aspirin chewable tablet 81 mg (has no administration in time range)  atorvastatin (LIPITOR) tablet 80 mg (has no administration in time range)  metoprolol succinate (TOPROL-XL) 24 hr tablet 12.5 mg (has no administration in time range)  pantoprazole (PROTONIX) EC tablet 40 mg (has no administration in time range)  multivitamin (RENA-VIT) tablet 1 tablet (has no administration in time range)  feeding supplement (ENSURE ENLIVE / ENSURE PLUS) liquid 237 mL (has no administration in time range)  lactated ringers infusion (has no administration in time range)  enoxaparin (LOVENOX) injection 40 mg (has no administration in time range)  cefTRIAXone (ROCEPHIN) 2 g in sodium chloride 0.9 % 100  mL IVPB (has no administration in time range)  metroNIDAZOLE (FLAGYL) IVPB 500 mg (has no administration in time range)  acetaminophen (TYLENOL) tablet 650 mg (has no administration in time range)    Or  acetaminophen (TYLENOL) suppository 650 mg (has no administration in time range)  traZODone (DESYREL) tablet 25 mg (has no administration in time range)  ondansetron (ZOFRAN) tablet 4 mg (has no administration in time range)    Or  ondansetron (ZOFRAN) injection 4 mg (has no administration in time range)  loperamide (IMODIUM) capsule 2 mg (has no administration in time range)  sodium chloride 0.9 % bolus 1,000 mL (0 mLs Intravenous Stopped 08/07/23 2030)  ondansetron (ZOFRAN) injection 4 mg (4 mg Intravenous Given 08/07/23 1855)  pantoprazole (PROTONIX) injection 40 mg (40 mg Intravenous Given 08/07/23 1853)  iohexol (OMNIPAQUE) 350 MG/ML injection 50 mL (50 mLs Intravenous Contrast Given 08/07/23 1905)  cefTRIAXone (ROCEPHIN) 2 g in sodium chloride 0.9 % 100 mL IVPB (2 g Intravenous New Bag/Given 08/07/23 2203)     IMPRESSION / MDM / ASSESSMENT AND PLAN / ED COURSE  I reviewed the triage vital signs and the nursing notes.  DDx: Diverticulitis, colitis, bowel obstruction, pancreatitis, viral illness, dehydration, electrolyte derangement, C. difficile infection  Patient's presentation is most consistent with acute presentation with potential threat to life or bodily function.  Patient presents with nausea vomiting diarrhea and abdominal pain for the last 3 days.  Vital signs unremarkable except for mild tachycardia which I think is due to dehydration.  Has a leukocytosis as well.  Will check for C. difficile, along with CT abdomen pelvis.  Serum potassium is 2.7, will give IV potassium along with Zofran.   ----------------------------------------- 10:53 PM on 08/07/2023 ----------------------------------------- Case discussed with hospitalist      FINAL CLINICAL IMPRESSION(S) / ED  DIAGNOSES   Final diagnoses:  Cystitis  Stercoral colitis  ESRD on hemodialysis (HCC)     Rx / DC Orders   ED Discharge Orders     None        Note:  This document was prepared using Dragon voice recognition software and may include unintentional dictation errors.   Sharman Cheek, MD 08/07/23 2253

## 2023-08-07 NOTE — ED Notes (Signed)
Potassium 2.7, informed EDP.

## 2023-08-07 NOTE — Assessment & Plan Note (Signed)
-   She may be having mild AKI though her creatinine is comparable to previous levels earlier this month. - She will be hydrated as mentioned above and will follow BMPs.

## 2023-08-08 DIAGNOSIS — N186 End stage renal disease: Secondary | ICD-10-CM | POA: Diagnosis present

## 2023-08-08 DIAGNOSIS — K219 Gastro-esophageal reflux disease without esophagitis: Secondary | ICD-10-CM | POA: Diagnosis present

## 2023-08-08 DIAGNOSIS — E43 Unspecified severe protein-calorie malnutrition: Secondary | ICD-10-CM | POA: Diagnosis present

## 2023-08-08 DIAGNOSIS — A4150 Gram-negative sepsis, unspecified: Secondary | ICD-10-CM

## 2023-08-08 DIAGNOSIS — G40909 Epilepsy, unspecified, not intractable, without status epilepticus: Secondary | ICD-10-CM | POA: Diagnosis present

## 2023-08-08 DIAGNOSIS — Z79899 Other long term (current) drug therapy: Secondary | ICD-10-CM | POA: Diagnosis not present

## 2023-08-08 DIAGNOSIS — N19 Unspecified kidney failure: Secondary | ICD-10-CM | POA: Diagnosis not present

## 2023-08-08 DIAGNOSIS — I700 Atherosclerosis of aorta: Secondary | ICD-10-CM | POA: Diagnosis present

## 2023-08-08 DIAGNOSIS — E1165 Type 2 diabetes mellitus with hyperglycemia: Secondary | ICD-10-CM | POA: Diagnosis present

## 2023-08-08 DIAGNOSIS — N2581 Secondary hyperparathyroidism of renal origin: Secondary | ICD-10-CM | POA: Diagnosis present

## 2023-08-08 DIAGNOSIS — N170 Acute kidney failure with tubular necrosis: Secondary | ICD-10-CM | POA: Diagnosis present

## 2023-08-08 DIAGNOSIS — Z9289 Personal history of other medical treatment: Secondary | ICD-10-CM | POA: Diagnosis not present

## 2023-08-08 DIAGNOSIS — Z8543 Personal history of malignant neoplasm of ovary: Secondary | ICD-10-CM | POA: Diagnosis not present

## 2023-08-08 DIAGNOSIS — Z8249 Family history of ischemic heart disease and other diseases of the circulatory system: Secondary | ICD-10-CM | POA: Diagnosis not present

## 2023-08-08 DIAGNOSIS — I4719 Other supraventricular tachycardia: Secondary | ICD-10-CM | POA: Diagnosis present

## 2023-08-08 DIAGNOSIS — T829XXA Unspecified complication of cardiac and vascular prosthetic device, implant and graft, initial encounter: Secondary | ICD-10-CM | POA: Diagnosis present

## 2023-08-08 DIAGNOSIS — E785 Hyperlipidemia, unspecified: Secondary | ICD-10-CM | POA: Diagnosis present

## 2023-08-08 DIAGNOSIS — R112 Nausea with vomiting, unspecified: Secondary | ICD-10-CM | POA: Diagnosis present

## 2023-08-08 DIAGNOSIS — E876 Hypokalemia: Secondary | ICD-10-CM | POA: Diagnosis present

## 2023-08-08 DIAGNOSIS — I428 Other cardiomyopathies: Secondary | ICD-10-CM | POA: Diagnosis present

## 2023-08-08 DIAGNOSIS — D631 Anemia in chronic kidney disease: Secondary | ICD-10-CM | POA: Diagnosis present

## 2023-08-08 DIAGNOSIS — B9562 Methicillin resistant Staphylococcus aureus infection as the cause of diseases classified elsewhere: Secondary | ICD-10-CM | POA: Diagnosis not present

## 2023-08-08 DIAGNOSIS — Z992 Dependence on renal dialysis: Secondary | ICD-10-CM | POA: Diagnosis not present

## 2023-08-08 DIAGNOSIS — Y838 Other surgical procedures as the cause of abnormal reaction of the patient, or of later complication, without mention of misadventure at the time of the procedure: Secondary | ICD-10-CM | POA: Diagnosis present

## 2023-08-08 DIAGNOSIS — I132 Hypertensive heart and chronic kidney disease with heart failure and with stage 5 chronic kidney disease, or end stage renal disease: Secondary | ICD-10-CM | POA: Diagnosis present

## 2023-08-08 DIAGNOSIS — I2510 Atherosclerotic heart disease of native coronary artery without angina pectoris: Secondary | ICD-10-CM | POA: Diagnosis present

## 2023-08-08 DIAGNOSIS — N390 Urinary tract infection, site not specified: Secondary | ICD-10-CM | POA: Diagnosis not present

## 2023-08-08 DIAGNOSIS — Z452 Encounter for adjustment and management of vascular access device: Secondary | ICD-10-CM | POA: Diagnosis not present

## 2023-08-08 DIAGNOSIS — N3091 Cystitis, unspecified with hematuria: Secondary | ICD-10-CM | POA: Diagnosis present

## 2023-08-08 DIAGNOSIS — J45909 Unspecified asthma, uncomplicated: Secondary | ICD-10-CM | POA: Diagnosis present

## 2023-08-08 DIAGNOSIS — E1122 Type 2 diabetes mellitus with diabetic chronic kidney disease: Secondary | ICD-10-CM | POA: Diagnosis present

## 2023-08-08 DIAGNOSIS — Z7982 Long term (current) use of aspirin: Secondary | ICD-10-CM | POA: Diagnosis not present

## 2023-08-08 DIAGNOSIS — I5022 Chronic systolic (congestive) heart failure: Secondary | ICD-10-CM | POA: Diagnosis present

## 2023-08-08 DIAGNOSIS — N179 Acute kidney failure, unspecified: Secondary | ICD-10-CM | POA: Diagnosis not present

## 2023-08-08 DIAGNOSIS — I5084 End stage heart failure: Secondary | ICD-10-CM | POA: Diagnosis present

## 2023-08-08 DIAGNOSIS — R197 Diarrhea, unspecified: Secondary | ICD-10-CM | POA: Diagnosis not present

## 2023-08-08 LAB — CBC
HCT: 28.6 % — ABNORMAL LOW (ref 36.0–46.0)
Hemoglobin: 9.8 g/dL — ABNORMAL LOW (ref 12.0–15.0)
MCH: 30.2 pg (ref 26.0–34.0)
MCHC: 34.3 g/dL (ref 30.0–36.0)
MCV: 88.3 fL (ref 80.0–100.0)
Platelets: 231 10*3/uL (ref 150–400)
RBC: 3.24 MIL/uL — ABNORMAL LOW (ref 3.87–5.11)
RDW: 14.1 % (ref 11.5–15.5)
WBC: 11.2 10*3/uL — ABNORMAL HIGH (ref 4.0–10.5)
nRBC: 0 % (ref 0.0–0.2)

## 2023-08-08 LAB — CBG MONITORING, ED
Glucose-Capillary: 135 mg/dL — ABNORMAL HIGH (ref 70–99)
Glucose-Capillary: 144 mg/dL — ABNORMAL HIGH (ref 70–99)
Glucose-Capillary: 121 mg/dL — ABNORMAL HIGH (ref 70–99)

## 2023-08-08 LAB — BASIC METABOLIC PANEL
Anion gap: 7 (ref 5–15)
BUN: 24 mg/dL — ABNORMAL HIGH (ref 6–20)
CO2: 27 mmol/L (ref 22–32)
Calcium: 9.5 mg/dL (ref 8.9–10.3)
Chloride: 104 mmol/L (ref 98–111)
Creatinine, Ser: 1.39 mg/dL — ABNORMAL HIGH (ref 0.44–1.00)
GFR, Estimated: 44 mL/min — ABNORMAL LOW (ref >60)
Glucose, Bld: 137 mg/dL — ABNORMAL HIGH (ref 70–99)
Potassium: 2.7 mmol/L — CL (ref 3.5–5.1)
Sodium: 138 mmol/L (ref 135–145)

## 2023-08-08 LAB — PROTIME-INR
INR: 1.1 (ref 0.8–1.2)
Prothrombin Time: 14.1 s (ref 11.4–15.2)

## 2023-08-08 LAB — CORTISOL-AM, BLOOD: Cortisol - AM: 23.1 ug/dL — ABNORMAL HIGH (ref 6.7–22.6)

## 2023-08-08 LAB — MAGNESIUM: Magnesium: 1.6 mg/dL — ABNORMAL LOW (ref 1.7–2.4)

## 2023-08-08 MED ORDER — LACTATED RINGERS IV SOLN: INTRAVENOUS | Status: AC

## 2023-08-08 MED ORDER — MAGNESIUM SULFATE 4 GM/100ML IV SOLN
4.0000 g | Freq: Once | INTRAVENOUS | Status: AC
  Administered 2023-08-08: 4 g via INTRAVENOUS
  Filled 2023-08-08: qty 100

## 2023-08-08 MED ORDER — RISAQUAD PO CAPS
1.0000 | ORAL_CAPSULE | Freq: Every day | ORAL | Status: DC
  Administered 2023-08-08 – 2023-08-17 (×10): 1 via ORAL
  Filled 2023-08-08 (×9): qty 1

## 2023-08-08 MED ORDER — INSULIN ASPART 100 UNIT/ML IJ SOLN: 0.0000 [IU] | Freq: Every day | INTRAMUSCULAR | Status: DC

## 2023-08-08 MED ORDER — POTASSIUM CHLORIDE CRYS ER 20 MEQ PO TBCR
40.0000 meq | EXTENDED_RELEASE_TABLET | ORAL | Status: AC
  Administered 2023-08-08 (×3): 40 meq via ORAL
  Filled 2023-08-08 (×3): qty 2

## 2023-08-08 MED ORDER — INSULIN ASPART 100 UNIT/ML IJ SOLN
0.0000 [IU] | Freq: Three times a day (TID) | INTRAMUSCULAR | Status: DC
  Administered 2023-08-08 (×2): 1 [IU] via SUBCUTANEOUS
  Administered 2023-08-10: 2 [IU] via SUBCUTANEOUS
  Administered 2023-08-11 – 2023-08-12 (×2): 1 [IU] via SUBCUTANEOUS
  Administered 2023-08-13 – 2023-08-14 (×2): 2 [IU] via SUBCUTANEOUS
  Filled 2023-08-08 (×8): qty 1

## 2023-08-08 NOTE — ED Notes (Signed)
Pt called out for soiled brief. RN cleaned pt and changed brief. Warm blankets given

## 2023-08-08 NOTE — ED Notes (Signed)
Pt resting with eyes closed. Respirations even and non labored. CB remains within reach.

## 2023-08-08 NOTE — ED Notes (Signed)
Pt bed wet. Bedding changed. Brief changed. Urine and scant amount of stool noted. CB remains within reach. Pt denies any further needs at this time.

## 2023-08-08 NOTE — Progress Notes (Signed)
Physical Therapy Evaluation Patient Details Name: Sarah Lane MRN: [id number] DOB: 07-May-1966 Today's Date: 08/08/2023  History of Present Illness  Pt is a 57 y/o female admitted secondary to nausea, vomiting and diahrrea. Pt found to have a UTI and sepsis. 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   Clinical Impression  Pt presented supine in bed with HOB elevated, awake and willing to participate in therapy session. Prior to admission, pt reported that she ambulated without use of an AD and was independent with ADLs. Pt lives with a roommate in a single level home with a ramped entrance. At the time of evaluation, pt very limited with functional mobility secondary to pain, generalized weakness and fatigue. Pt able to complete bed mobility with mod A and transfers with [facility] and [facility] for safety. Pt declining further mobility at this time due to fatigue and pain. Based on pt's current functional mobility status, PT recommending further intensive therapy services (<3 hrs/day) upon d/c from hospital to maximize her safety and independence with functional mobility prior to returning home. Pt would continue to benefit from skilled physical therapy services at this time while admitted and after d/c to address the below listed limitations in order to improve overall safety and independence with functional mobility.         If plan is discharge home, recommend the following: A lot of help with walking and/or transfers;A lot of help with bathing/dressing/bathroom;Assistance with cooking/housework;Help with stairs or ramp for entrance;Assist for transportation   Can travel by private vehicle   No    Equipment Recommendations Other (comment) (defer to next venue of care)  Recommendations for Other Services       Functional Status Assessment Patient has had a recent decline in their  functional status and demonstrates the ability to make significant improvements in function in a reasonable and predictable amount of time.     Precautions / Restrictions Precautions Precautions: Fall Restrictions Weight Bearing Restrictions Per Provider Order: No      Mobility  Bed Mobility Overal bed mobility: Needs Assistance Bed Mobility: Supine to Sit, Sit to Supine     Supine to sit: Mod assist Sit to supine: Contact guard assist   General bed mobility comments: increased time and effort, mod A needed to elevate trunk to achieve an upright sitting position, no physical assistance needed to return to supine in stretcher    Transfers Overall transfer level: Needs assistance Equipment used: 2 person hand held assist Transfers: Sit to/from Stand Sit to Stand: Contact guard assist           General transfer comment: pt standing prematurely upon sitting up, PT providing 2HHA for safety    Ambulation/Gait               General Gait Details: pt unable to tolerate secondary to reported fatigue and pain  Stairs            Wheelchair Mobility     Tilt Bed    Modified Rankin (Stroke Patients Only)       Balance Overall balance assessment: Needs assistance Sitting-balance support: Feet supported Sitting balance-Leahy Scale: Fair     Standing balance support: During functional activity, Bilateral upper extremity supported, Single extremity supported Standing balance-Leahy Scale: Poor  Pertinent Vitals/Pain Pain Assessment Pain Assessment: Faces Faces Pain Scale: Hurts even more Pain Location: generalized with movement Pain Descriptors / Indicators: Guarding Pain Intervention(s): Monitored during session, Repositioned    Home Living Family/patient expects to be discharged to:: Private residence Living Arrangements: Non-relatives/Friends Available Help at Discharge: Friend(s);Available  PRN/intermittently Type of Home: Mobile home Home Access: Ramped entrance       Home Layout: One level Home Equipment: None Additional Comments: pt stated that her "roommate" told her she could not use any DME    Prior Function Prior Level of Function : Independent/Modified Independent             Mobility Comments: pt reported that she does not use any DME as her "roommate" does not allow her to ADLs Comments: independent; pt reported that she does not take a shower/bathe but instead does a "wash-up" at the sink     Extremity/Trunk Assessment   Upper Extremity Assessment Upper Extremity Assessment: Defer to OT evaluation;Generalized weakness    Lower Extremity Assessment Lower Extremity Assessment: Generalized weakness       Communication   Communication Communication: Difficulty communicating thoughts/reduced clarity of speech Cueing Techniques: Verbal cues;Visual cues  Cognition Arousal: Alert Behavior During Therapy: Flat affect Overall Cognitive Status: No family/caregiver present to determine baseline cognitive functioning Area of Impairment: Problem solving                             Problem Solving: Slow processing, Decreased initiation, Requires verbal cues          General Comments      Exercises     Assessment/Plan    PT Assessment Patient needs continued PT services  PT Problem List Decreased strength;Decreased range of motion;Decreased activity tolerance;Decreased balance;Decreased mobility;Decreased coordination;Decreased knowledge of use of DME;Decreased safety awareness;Decreased knowledge of precautions;Pain       PT Treatment Interventions DME instruction;Gait training;Stair training;Functional mobility training;Therapeutic activities;Therapeutic exercise;Balance training;Neuromuscular re-education;Patient/family education    PT Goals (Current goals can be found in the Care Plan section)  Acute Rehab PT Goals Patient  Stated Goal: to feel better PT Goal Formulation: With patient Time For Goal Achievement: 08/22/23 Potential to Achieve Goals: Good    Frequency Min 1X/week     Co-evaluation               AM-PAC PT "6 Clicks" Mobility  Outcome Measure Help needed turning from your back to your side while in a flat bed without using bedrails?: A Little Help needed moving from lying on your back to sitting on the side of a flat bed without using bedrails?: A Little Help needed moving to and from a bed to a chair (including a wheelchair)?: A Lot Help needed standing up from a chair using your arms (e.g., wheelchair or bedside chair)?: A Little Help needed to walk in hospital room?: A Lot Help needed climbing 3-5 steps with a railing? : A Lot 6 Click Score: 15    End of Session   Activity Tolerance: Patient limited by fatigue;Patient limited by pain Patient left: with call bell/phone within reach;Other (comment) (on stretcher in ED) Nurse Communication: Mobility status PT Visit Diagnosis: Other abnormalities of gait and mobility (R26.89)    Time: 5956-3875 PT Time Calculation (min) (ACUTE ONLY): 13 min   Charges:   PT Evaluation $PT Eval Moderate Complexity: 1 Mod   PT General Charges $$ ACUTE PT VISIT: 1 Visit  Arletta Bale, DPT  Acute Rehabilitation Services Office [phone]   Alessandra Bevels Kenitra Leventhal 08/08/2023, 12:31 PM

## 2023-08-08 NOTE — Assessment & Plan Note (Signed)
-   Potassium will be replaced and magnesium level will be checked. ?

## 2023-08-08 NOTE — ED Notes (Signed)
Manuela Schwartz, NP aware of critical K of 2.7

## 2023-08-08 NOTE — ED Notes (Signed)
Pt called out for soiled brief. This NT cleaned pt and provided a new brief. Bed lock I lowest position, call light in reach, comfort measures provided.

## 2023-08-08 NOTE — Progress Notes (Signed)
PHARMACIST - PHYSICIAN COMMUNICATION  CONCERNING:  Enoxaparin (Lovenox) for DVT Prophylaxis    RECOMMENDATION: Patient was prescribed enoxaprin 40mg  q24 hours for VTE prophylaxis.   Filed Weights   08/07/23 1431  Weight: 58.1 kg (128 lb)    Body mass index is 25 kg/m.  Estimated Creatinine Clearance: 27.1 mL/min (A) (by C-G formula based on SCr of 1.78 mg/dL (H)).  Patient is candidate for enoxaparin 30mg  every 24 hours based on CrCl <76ml/min or Weight <45kg  DESCRIPTION: Pharmacy has adjusted enoxaparin dose per [facility] policy.  Patient is now receiving enoxaparin 30 mg every 24 hours   Otelia Sergeant, PharmD, [facility] 08/08/2023 3:08 AM

## 2023-08-08 NOTE — Progress Notes (Signed)
PROGRESS NOTE    Sarah Lane  ZOX:[id number] DOB: 05/07/1966 DOA: 08/07/2023 PCP: [facility]    Brief Narrative:  Sarah Lane is a 57 y.o. female with medical history significant for asthma, type diabetes mellitus, hypertension, PSVT, seizure disorder and ovarian cancer status post TAH and BSO, who presented to the ER with acute onset of recurrent nausea, vomiting with diarrhea over the last week.  She denied any bilious vomitus or hematemesis.  No melena or bright red bleeding per rectum.  She admitted to abdominal cramps associated with her symptoms.  She has been having urinary urgency and bilateral flank pain without dysuria or hematuria.  No cough or wheezing or dyspnea.  No chest pain or palpitations.  No fever or chills.    Assessment and Plan: Sepsis due to gram-negative UTI (HCC) - Sepsis is manifested by leukocytosis, tachycardia and tachypnea. - IV Rocephin. - follow blood and urine cultures. -c diff negative - IVF  Acute gastroenteritis - This could be associated with enterocolitis. - Management as above. - As needed antiemetics and antidiarrheals will be provided. -flagyl was added in the ER   Uncontrolled type 2 diabetes mellitus with hyperglycemia, without long-term current use of insulin (HCC) - SSI  Hypokalemia - replete -check Mg  Essential hypertension - monitor BP- restart toprolol only  Dyslipidemia - statins  Stage 3b chronic kidney disease (CKD) (HCC) -IVF and daily labs  GERD without esophagitis -  PPI therapy.   Lives with roommate- PT/OT eval   DVT prophylaxis: enoxaparin (LOVENOX) injection 30 mg Start: 08/08/23 1000    Code Status: Full Code Family Communication:   Disposition Plan:  Level of care: Telemetry Medical Status is: Observation The patient will require care spanning > 2 midnights and should be moved to inpatient     Consultants:  none   Subjective: Still with abdominal  discomfort  Objective: Vitals:   08/08/23 0400 08/08/23 0500 08/08/23 0539 08/08/23 0600  BP: 119/70 110/69  114/72  Pulse: 70 70  75  Resp: 13 15  14   Temp:   98.3 F (36.8 C)   TempSrc:      SpO2: 92% 97%  99%  Weight:      Height:       No intake or output data in the 24 hours ending 08/08/23 0805 Filed Weights   08/07/23 1431  Weight: 58.1 kg    Examination:   General: Appearance:     Overweight female in no acute distress     Lungs:      respirations unlabored  Heart:    Normal heart rate. Normal rhythm. No murmurs, rubs, or gallops.    MS:   All extremities are intact.    Neurologic:   Awake, alert       Data Reviewed: I have personally reviewed following labs and imaging studies  CBC: Recent Labs  Lab 08/07/23 1435 08/08/23 0540  WBC 15.7* 11.2*  HGB 12.3 9.8*  HCT 35.4* 28.6*  MCV 88.3 88.3  PLT 312 231   Basic Metabolic Panel: Recent Labs  Lab 08/07/23 1435 08/08/23 0540  NA 137 138  K 2.7* 2.7*  CL 97* 104  CO2 27 27  GLUCOSE 312* 137*  BUN 27* 24*  CREATININE 1.78* 1.39*  CALCIUM 10.2 9.5  MG  --  1.6*   GFR: Estimated Creatinine Clearance: 34.7 mL/min (A) (by C-G formula based on SCr of 1.39 mg/dL (H)). Liver Function Tests: Recent Labs  Lab 08/07/23 1435  AST 20  ALT 15  ALKPHOS 76  BILITOT 1.2*  PROT 7.0  ALBUMIN 3.7   Recent Labs  Lab 08/07/23 1435  LIPASE 35   No results for input(s): "AMMONIA" in the last 168 hours. Coagulation Profile: Recent Labs  Lab 08/08/23 0540  INR 1.1   Cardiac Enzymes: No results for input(s): "CKTOTAL", "CKMB", "CKMBINDEX", "TROPONINI" in the last 168 hours. BNP (last 3 results) No results for input(s): "PROBNP" in the last 8760 hours. HbA1C: No results for input(s): "HGBA1C" in the last 72 hours. CBG: No results for input(s): "GLUCAP" in the last 168 hours. Lipid Profile: No results for input(s): "CHOL", "HDL", "LDLCALC", "TRIG", "CHOLHDL", "LDLDIRECT" in the last 72  hours. Thyroid Function Tests: No results for input(s): "TSH", "T4TOTAL", "FREET4", "T3FREE", "THYROIDAB" in the last 72 hours. Anemia Panel: No results for input(s): "VITAMINB12", "FOLATE", "FERRITIN", "TIBC", "IRON", "RETICCTPCT" in the last 72 hours. Sepsis Labs: No results for input(s): "PROCALCITON", "LATICACIDVEN" in the last 168 hours.  Recent Results (from the past 240 hours)  C Difficile Quick Screen w PCR reflex     Status: None   Collection Time: 08/07/23  6:59 PM   Specimen: STOOL  Result Value Ref Range Status   C Diff antigen NEGATIVE NEGATIVE Final   C Diff toxin NEGATIVE NEGATIVE Final   C Diff interpretation No C. difficile detected.  Final    Comment: Performed at [facility], [address] [place], [place] [address]  Culture, blood (Routine X 2) w Reflex to ID Panel     Status: None (Preliminary result)   Collection Time: 08/07/23 11:29 PM   Specimen: BLOOD  Result Value Ref Range Status   Specimen Description BLOOD BLOOD RIGHT ARM  Final   Special Requests   Final    BOTTLES DRAWN AEROBIC AND ANAEROBIC Blood Culture results may not be optimal due to an inadequate volume of blood received in culture bottles   Culture   Final    NO GROWTH < 12 HOURS Performed at [facility], [address], [place], [place] [address]    Report Status PENDING  Incomplete  Culture, blood (Routine X 2) w Reflex to ID Panel     Status: None (Preliminary result)   Collection Time: 08/07/23 11:29 PM   Specimen: BLOOD  Result Value Ref Range Status   Specimen Description BLOOD BLOOD LEFT ARM  Final   Special Requests   Final    BOTTLES DRAWN AEROBIC AND ANAEROBIC Blood Culture results may not be optimal due to an inadequate volume of blood received in culture bottles   Culture   Final    NO GROWTH < 12 HOURS Performed at [facility], [address], [place], [place] [address]    Report Status PENDING  Incomplete         Radiology  Studies: CT ABDOMEN PELVIS W CONTRAST Result Date: 08/07/2023 CLINICAL DATA:  Two day history of nausea, vomiting, and diarrhea EXAM: CT ABDOMEN AND PELVIS WITH CONTRAST TECHNIQUE: Multidetector CT imaging of the abdomen and pelvis was performed using the standard protocol following bolus administration of intravenous contrast. RADIATION DOSE REDUCTION: This exam was performed according to the departmental dose-optimization program which includes automated exposure control, adjustment of the mA and/or kV according to patient size and/or use of iterative reconstruction technique. CONTRAST:  50mL OMNIPAQUE IOHEXOL 350 MG/ML SOLN COMPARISON:  CT abdomen and pelvis dated 07/09/2023 and multiple priors including nuclear medicine PET dated 01/16/2020 FINDINGS: Lower chest: Partially imaged  central venous catheter tip terminates in the right atrium. Unchanged non radiotracer avid 1.4 x 1.3 cm right lower lobe nodule. No pleural effusion or pneumothorax demonstrated. Partially imaged heart size is normal. Coronary artery calcifications. Hepatobiliary: No focal hepatic lesions. No intra or extrahepatic biliary ductal dilation. Cholecystectomy. Pancreas: No focal lesions or main ductal dilation. Spleen: Normal in size without focal abnormality. Adrenals/Urinary Tract: No adrenal nodules. No suspicious renal mass, calculi or hydronephrosis. Mild circumferential mural thickening of the urinary bladder. Stomach/Bowel: Normal appearance of the stomach. Mildly dilated rectum contains large volume stool and demonstrates mild circumferential mural thickening. Normal appendix. Vascular/Lymphatic: Aortic atherosclerosis. No enlarged abdominal or pelvic lymph nodes. Reproductive: No adnexal masses. Other: Mild presacral edema.  No free air or fluid collection. Musculoskeletal: No acute or abnormal lytic or blastic osseous lesions. Multilevel degenerative changes of the partially imaged thoracic and lumbar spine. Unchanged anterior  wedging of T12 and L1 and superior endplate deformity of T11. Small paraumbilical hernia contains fat and a loop of nonobstructed small bowel. IMPRESSION: 1. Mildly dilated rectum contains large volume stool and demonstrates mild circumferential mural thickening, which may be seen in the setting of stercoral colitis. 2. Mild circumferential mural thickening of the urinary bladder, which may be seen in the setting of cystitis. Recommend correlation with urinalysis. 3. Aortic Atherosclerosis (ICD10-I70.0). Coronary artery calcifications. Assessment for potential risk factor modification, dietary therapy or pharmacologic therapy may be warranted, if clinically indicated. Electronically Signed   By: Agustin Cree M.D.   On: 08/07/2023 19:33        Scheduled Meds:  aspirin  81 mg Oral Daily   atorvastatin  80 mg Oral Daily   enoxaparin (LOVENOX) injection  30 mg Subcutaneous Q24H   feeding supplement  237 mL Oral BID BM   metoprolol succinate  12.5 mg Oral Daily   multivitamin  1 tablet Oral QHS   pantoprazole  40 mg Oral Daily   potassium chloride  40 mEq Oral Q4H   Continuous Infusions:  cefTRIAXone (ROCEPHIN)  IV     lactated ringers 150 mL/hr (08/08/23 0031)   metronidazole       LOS: 0 days    Time spent: 45 minutes spent on chart review, discussion with nursing staff, consultants, updating family and interview/physical exam; more than 50% of that time was spent in counseling and/or coordination of care.    Joseph Art, DO [facility] Available via Epic secure chat 7am-7pm After these hours, please refer to coverage provider listed on [email or web address] 08/08/2023, 8:05 AM

## 2023-08-09 DIAGNOSIS — R112 Nausea with vomiting, unspecified: Secondary | ICD-10-CM

## 2023-08-09 DIAGNOSIS — R197 Diarrhea, unspecified: Secondary | ICD-10-CM

## 2023-08-09 LAB — CBC
HCT: 28.6 % — ABNORMAL LOW (ref 36.0–46.0)
Hemoglobin: 9.7 g/dL — ABNORMAL LOW (ref 12.0–15.0)
MCH: 30.1 pg (ref 26.0–34.0)
MCHC: 33.9 g/dL (ref 30.0–36.0)
MCV: 88.8 fL (ref 80.0–100.0)
Platelets: 232 10*3/uL (ref 150–400)
RBC: 3.22 MIL/uL — ABNORMAL LOW (ref 3.87–5.11)
RDW: 14.4 % (ref 11.5–15.5)
WBC: 9.5 10*3/uL (ref 4.0–10.5)
nRBC: 0 % (ref 0.0–0.2)

## 2023-08-09 LAB — BASIC METABOLIC PANEL
Anion gap: 5 (ref 5–15)
BUN: 19 mg/dL (ref 6–20)
CO2: 25 mmol/L (ref 22–32)
Calcium: 9.1 mg/dL (ref 8.9–10.3)
Chloride: 108 mmol/L (ref 98–111)
Creatinine, Ser: 1.18 mg/dL — ABNORMAL HIGH (ref 0.44–1.00)
GFR, Estimated: 53 mL/min — ABNORMAL LOW (ref >60)
Glucose, Bld: 100 mg/dL — ABNORMAL HIGH (ref 70–99)
Potassium: 5 mmol/L (ref 3.5–5.1)
Sodium: 138 mmol/L (ref 135–145)

## 2023-08-09 LAB — GLUCOSE, CAPILLARY: Glucose-Capillary: 123 mg/dL — ABNORMAL HIGH (ref 70–99)

## 2023-08-09 LAB — CBG MONITORING, ED
Glucose-Capillary: 104 mg/dL — ABNORMAL HIGH (ref 70–99)
Glucose-Capillary: 112 mg/dL — ABNORMAL HIGH (ref 70–99)
Glucose-Capillary: 99 mg/dL (ref 70–99)

## 2023-08-09 MED ORDER — CHLORHEXIDINE GLUCONATE CLOTH 2 % EX PADS
6.0000 | MEDICATED_PAD | Freq: Every day | CUTANEOUS | Status: DC
  Administered 2023-08-10 – 2023-08-12 (×3): 6 via TOPICAL

## 2023-08-09 MED ORDER — HEPARIN SODIUM (PORCINE) 5000 UNIT/ML IJ SOLN
5000.0000 [IU] | Freq: Three times a day (TID) | INTRAMUSCULAR | Status: DC
  Administered 2023-08-09 – 2023-08-17 (×24): 5000 [IU] via SUBCUTANEOUS
  Filled 2023-08-09 (×24): qty 1

## 2023-08-09 NOTE — Progress Notes (Addendum)
Physical Therapy Treatment Patient Details Name: Sarah Lane MRN: [id number] DOB: [birth year] Today's Date: 08/09/2023   History of Present Illness Pt is a 57 y/o female admitted secondary to nausea, vomiting and diahrrea. Pt found to have a UTI and sepsis. 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    PT Comments  Pt ready for session.  She is able to get to EOB with mod a x 1 on stretcher.  Feet do not touch floor due to height of stretcher.  She participates in LE ex and sitting balance activities and does well without LOB but is a bit hesitant at times.  She declined attempt at standing with +2 assist.  Returns to supine and needs in reach.  Overall does well with no behaviors noted during session.  She does voice overall frustration regarding her living situation and her feeling of lack of support in her housing arrangement and fear over her fathers failing health and not being able to see her family who live in [place].   If plan is discharge home, recommend the following: Assistance with cooking/housework;Help with stairs or ramp for entrance;Assist for transportation;Two people to help with walking and/or transfers;Two people to help with bathing/dressing/bathroom   Can travel by private vehicle        Equipment Recommendations       Recommendations for Other Services       Precautions / Restrictions Precautions Precautions: Fall Restrictions Weight Bearing Restrictions Per Provider Order: No     Mobility  Bed Mobility Overal bed mobility: Needs Assistance Bed Mobility: Supine to Sit, Sit to Supine     Supine to sit: Mod assist Sit to supine: Contact guard assist        Transfers                   General transfer comment: declined attempt    Ambulation/Gait                   Stairs             Wheelchair Mobility      Tilt Bed    Modified Rankin (Stroke Patients Only)       Balance Overall balance assessment: Needs assistance Sitting-balance support: Feet supported Sitting balance-Leahy Scale: Fair Sitting balance - Comments: does well reaching outside of BOS despite being on high stretcher                                    Cognition Arousal: Alert Behavior During Therapy: WFL for tasks assessed/performed, Lability Overall Cognitive Status: Within Functional Limits for tasks assessed                                 General Comments: does begin crying when talking about her dad and his failing health and general lack of support at home.        Exercises Other Exercises Other Exercises: seated AROM BLE's and reaching left/right high/low    General Comments        Pertinent Vitals/Pain Pain Assessment Pain Assessment: No/denies pain    Home Living  Prior Function            PT Goals (current goals can now be found in the care plan section) Progress towards PT goals: Progressing toward goals    Frequency    Min 1X/week      PT Plan      Co-evaluation              AM-PAC PT "6 Clicks" Mobility   Outcome Measure  Help needed turning from your back to your side while in a flat bed without using bedrails?: A Little Help needed moving from lying on your back to sitting on the side of a flat bed without using bedrails?: A Little Help needed moving to and from a bed to a chair (including a wheelchair)?: A Lot Help needed standing up from a chair using your arms (e.g., wheelchair or bedside chair)?: A Little Help needed to walk in hospital room?: A Lot Help needed climbing 3-5 steps with a railing? : A Lot 6 Click Score: 15    End of Session   Activity Tolerance: Patient tolerated treatment well Patient left: with call bell/phone within reach;Other (comment);in bed Nurse Communication: Mobility  status PT Visit Diagnosis: Other abnormalities of gait and mobility (R26.89)     Time: 1610-9604 PT Time Calculation (min) (ACUTE ONLY): 13 min  Charges:    $Therapeutic Activity: 8-22 mins PT General Charges $$ ACUTE PT VISIT: 1 Visit                   Danielle Dess, PTA 08/09/23, 12:42 PM

## 2023-08-09 NOTE — Progress Notes (Signed)
[facility]  ROUNDING NOTE   Subjective:   Sarah Lane  is a 57 y.o. female with a PMHx of diabetes mellitus type 2, seizure disorder, hypertension, dysrhythmia, and history of ovarian cancer.  Patient presents to the emergency department with nausea and vomiting. She has been admitted for Acute gastroenteritis [K52.9] UTI (urinary tract infection) [N39.0]  Patient is known to our practice and receives outpatient dialysis treatments at [facility] on a Tuesday/Saturday schedule. She was at the clinic to receive treatment on Saturday and became sick and was brought to ED. Patient states she has had nausea and vomiting for a little while now but has recently gotten worse.  Patient was recently admitted and treated for UTI. Continues to have diarrhea.   Labs on ED arrival concerning for potassium 2.7, BUN 27, creatinine 1.78 with GFR 32, white count 15.7.  UA remains turbid with hematuria and leukocytes.  Negative for C. difficile.  Urine culture positive for Staphylococcus aureus, pending sensitivities.  We have been consulted to manage dialysis needs during this admission.  Objective:  Vital signs in last 24 hours:  Temp:  [97.5 F (36.4 C)-98.6 F (37 C)] 98 F (36.7 C) (12/30 1300) Pulse Rate:  [62-88] 72 (12/30 1500) Resp:  [11-22] 20 (12/30 1500) BP: (99-126)/(63-99) 112/87 (12/30 1500) SpO2:  [95 %-100 %] 98 % (12/30 1500)  Weight change:  Filed Weights   08/07/23 1431  Weight: 58.1 kg    Intake/Output: No intake/output data recorded.   Intake/Output this shift:  No intake/output data recorded.  Physical Exam: General: NAD  Head: Normocephalic, atraumatic.  Dry oral mucosal membranes  Eyes: Anicteric  Lungs:  Clear to auscultation, normal effort  Heart: Regular rate and rhythm  Abdomen:  Soft, nontender, nondistended  Extremities: No peripheral edema.  Neurologic: Alert and oriented, moving all four extremities  Skin: No lesions  Access: Rt chest  permcath    Basic Metabolic Panel: Recent Labs  Lab 08/07/23 1435 08/08/23 0540 08/09/23 0621  NA 137 138 138  K 2.7* 2.7* 5.0  CL 97* 104 108  CO2 27 27 25   GLUCOSE 312* 137* 100*  BUN 27* 24* 19  CREATININE 1.78* 1.39* 1.18*  CALCIUM 10.2 9.5 9.1  MG  --  1.6*  --     Liver Function Tests: Recent Labs  Lab 08/07/23 1435  AST 20  ALT 15  ALKPHOS 76  BILITOT 1.2*  PROT 7.0  ALBUMIN 3.7   Recent Labs  Lab 08/07/23 1435  LIPASE 35    No results for input(s): "AMMONIA" in the last 168 hours.  CBC: Recent Labs  Lab 08/07/23 1435 08/08/23 0540 08/09/23 0621  WBC 15.7* 11.2* 9.5  HGB 12.3 9.8* 9.7*  HCT 35.4* 28.6* 28.6*  MCV 88.3 88.3 88.8  PLT 312 231 232    Cardiac Enzymes: No results for input(s): "CKTOTAL", "CKMB", "CKMBINDEX", "TROPONINI" in the last 168 hours.  BNP: Invalid input(s): "POCBNP"  CBG: Recent Labs  Lab 08/08/23 1215 08/08/23 1638 08/08/23 2124 08/09/23 0750 08/09/23 1154  GLUCAP 135* 144* 121* 104* 112*    Microbiology: Results for orders placed or performed during the hospital encounter of 08/07/23  C Difficile Quick Screen w PCR reflex     Status: None   Collection Time: 08/07/23  6:59 PM   Specimen: STOOL  Result Value Ref Range Status   C Diff antigen NEGATIVE NEGATIVE Final   C Diff toxin NEGATIVE NEGATIVE Final   C Diff interpretation No  C. difficile detected.  Final    Comment: Performed at [facility], [address] [place], [place] [address]  Urine Culture (for pregnant, neutropenic or urologic patients or patients with an indwelling urinary catheter)     Status: Abnormal (Preliminary result)   Collection Time: 08/07/23  8:20 PM   Specimen: Urine, Catheterized  Result Value Ref Range Status   Specimen Description   Final    URINE, CATHETERIZED Performed at [facility], [address], [place], [place] [address]    Special Requests   Final    NONE Performed at [facility],  [address], [place], [place] [address]    Culture (A)  Final    >=100,000 COLONIES/mL STAPHYLOCOCCUS AUREUS SUSCEPTIBILITIES TO FOLLOW Performed at [facility], [address] [place], [place] [address]    Report Status PENDING  Incomplete  Culture, blood (Routine X 2) w Reflex to ID Panel     Status: None (Preliminary result)   Collection Time: 08/07/23 11:29 PM   Specimen: BLOOD  Result Value Ref Range Status   Specimen Description BLOOD BLOOD RIGHT ARM  Final   Special Requests   Final    BOTTLES DRAWN AEROBIC AND ANAEROBIC Blood Culture results may not be optimal due to an inadequate volume of blood received in culture bottles   Culture   Final    NO GROWTH 1 DAY Performed at [facility], [address] [place], [place] [address]    Report Status PENDING  Incomplete  Culture, blood (Routine X 2) w Reflex to ID Panel     Status: None (Preliminary result)   Collection Time: 08/07/23 11:29 PM   Specimen: BLOOD  Result Value Ref Range Status   Specimen Description BLOOD BLOOD LEFT ARM  Final   Special Requests   Final    BOTTLES DRAWN AEROBIC AND ANAEROBIC Blood Culture results may not be optimal due to an inadequate volume of blood received in culture bottles   Culture   Final    NO GROWTH 1 DAY Performed at [facility], [address], [place], [place] [address]    Report Status PENDING  Incomplete    Coagulation Studies: Recent Labs    08/08/23 0540  LABPROT 14.1  INR 1.1    Urinalysis: Recent Labs    08/07/23 2020  COLORURINE YELLOW*  LABSPEC >1.046*  PHURINE 5.0  GLUCOSEU >=500*  HGBUR LARGE*  BILIRUBINUR NEGATIVE  KETONESUR NEGATIVE  PROTEINUR 100*  NITRITE NEGATIVE  LEUKOCYTESUR LARGE*       Imaging: CT ABDOMEN PELVIS W CONTRAST Result Date: 08/07/2023 CLINICAL DATA:  Two day history of nausea, vomiting, and diarrhea EXAM: CT ABDOMEN AND PELVIS WITH CONTRAST TECHNIQUE: Multidetector CT imaging of the abdomen and  pelvis was performed using the standard protocol following bolus administration of intravenous contrast. RADIATION DOSE REDUCTION: This exam was performed according to the departmental dose-optimization program which includes automated exposure control, adjustment of the mA and/or kV according to patient size and/or use of iterative reconstruction technique. CONTRAST:  50mL OMNIPAQUE IOHEXOL 350 MG/ML SOLN COMPARISON:  CT abdomen and pelvis dated 07/09/2023 and multiple priors including nuclear medicine PET dated 01/16/2020 FINDINGS: Lower chest: Partially imaged central venous catheter tip terminates in the right atrium. Unchanged non radiotracer avid 1.4 x 1.3 cm right lower lobe nodule. No pleural effusion or pneumothorax demonstrated. Partially imaged heart size is normal. Coronary artery calcifications. Hepatobiliary: No focal hepatic lesions. No intra or extrahepatic biliary ductal  dilation. Cholecystectomy. Pancreas: No focal lesions or main ductal dilation. Spleen: Normal in size without focal abnormality. Adrenals/Urinary Tract: No adrenal nodules. No suspicious renal mass, calculi or hydronephrosis. Mild circumferential mural thickening of the urinary bladder. Stomach/Bowel: Normal appearance of the stomach. Mildly dilated rectum contains large volume stool and demonstrates mild circumferential mural thickening. Normal appendix. Vascular/Lymphatic: Aortic atherosclerosis. No enlarged abdominal or pelvic lymph nodes. Reproductive: No adnexal masses. Other: Mild presacral edema.  No free air or fluid collection. Musculoskeletal: No acute or abnormal lytic or blastic osseous lesions. Multilevel degenerative changes of the partially imaged thoracic and lumbar spine. Unchanged anterior wedging of T12 and L1 and superior endplate deformity of T11. Small paraumbilical hernia contains fat and a loop of nonobstructed small bowel. IMPRESSION: 1. Mildly dilated rectum contains large volume stool and demonstrates mild  circumferential mural thickening, which may be seen in the setting of stercoral colitis. 2. Mild circumferential mural thickening of the urinary bladder, which may be seen in the setting of cystitis. Recommend correlation with urinalysis. 3. Aortic Atherosclerosis (ICD10-I70.0). Coronary artery calcifications. Assessment for potential risk factor modification, dietary therapy or pharmacologic therapy may be warranted, if clinically indicated. Electronically Signed   By: Agustin Cree M.D.   On: 08/07/2023 19:33     Medications:    cefTRIAXone (ROCEPHIN)  IV Stopped (08/08/23 2200)   metronidazole Stopped (08/09/23 1103)     acidophilus  1 capsule Oral Daily   aspirin  81 mg Oral Daily   atorvastatin  80 mg Oral Daily   [START ON 08/10/2023] Chlorhexidine Gluconate Cloth  6 each Topical Q0600   feeding supplement  237 mL Oral BID BM   heparin injection (subcutaneous)  5,000 Units Subcutaneous Q8H   insulin aspart  0-5 Units Subcutaneous QHS   insulin aspart  0-9 Units Subcutaneous TID WC   metoprolol succinate  12.5 mg Oral Daily   multivitamin  1 tablet Oral QHS   pantoprazole  40 mg Oral Daily   acetaminophen **OR** acetaminophen, loperamide, ondansetron **OR** ondansetron (ZOFRAN) IV, traZODone  Assessment/ Plan:  Ms. KIREN CODAY is a 57 y.o.  female  is a 56 y.o. female with a PMHx of diabetes mellitus type 2, seizure disorder, hypertension, dysrhythmia, and history of ovarian cancer.  Patient presents to the emergency department complaining of nausea, vomiting, and diarrhea.  Patient has been admitted for Acute gastroenteritis [K52.9] UTI (urinary tract infection) [N39.0]  [facility]/T-S/right chest PermCath  Acute Kidney Injury on chronic kidney disease stage IIIa with baseline creatinine 1.27 and GFR of 49 on 04/07/23.  Now receiving hemodialysis, initiated on 12/3. Acute kidney injury resulting in ATN secondary to volume depletion and continued ACE/ARB use. Last  treatment received on Thursday of last week. Will plan for next dialysis treatment tomorrow.  Lab Results  Component Value Date   CREATININE 1.18 (H) 08/09/2023   CREATININE 1.39 (H) 08/08/2023   CREATININE 1.78 (H) 08/07/2023   No intake or output data in the 24 hours ending 08/09/23 1540  2.  Hypokalemia.  Potassium 2.7 on admission, corrected with oral supplementation.  3. Anemia of chronic kidney disease Lab Results  Component Value Date   HGB 9.7 (L) 08/09/2023    Hemoglobin 9.7, within acceptable range.  Will continue to monitor for need of ESA's  4. Diabetes mellitus type II with chronic kidney disease: noninsulin dependent. Home regimen includes metformin. Most recent hemoglobin A1c is 8.2 on 03/31/23.   5. Secondary Hyperparathyroidism: with outpatient labs: PTH 153,  phosphorus 3.8, calcium 10.8 on 07/29/23.   Lab Results  Component Value Date   CALCIUM 9.1 08/09/2023   CAION 1.22 04/06/2023   PHOS 2.3 (L) 07/17/2023    Will continue to monitor bone minerals during this admission.   LOS: 1 Kenyona Rena 12/30/20243:40 PM

## 2023-08-09 NOTE — Progress Notes (Signed)
Progress Note   Patient: Sarah Lane DOB: 1965-12-10 DOA: 08/07/2023     1 DOS: the patient was seen and examined on 08/09/2023   Brief hospital course: 56yo with h/o asthma, DM, HTN, PSVT, seizure d/o, ESRD recently started on HD, and ovarian CA s/p TAH/BSO who presented on 12/28 with n/v/d.  UA with >100k colonies Staph aureus.  Assessment and Plan:  N/V/D Patient presented with 1 week of n/v/d Imaging with dilated rectum and concern for stercoral colitis She was started on Ceftriaxone and metronidazole C diff negative Will order bowel care  UTI Sepsis ruled out UA with >100k colonies Staph aureus Continue ceftriaxone Blood cultures NTD  Hypokalemia Repleted, resolved   Recent onset ESRD Stage 3b CKD on admission in 03/2023, bordering on stage 4 Cr 5.85 on admission on 11/29 with anuria not responsive to IVF Vascular surgery consulted for HD access during that admission, s/p Right IJ tunneled dialysis catheter  Initiated inpatient -> outpatient HD  She reports that she is now requiring only twice weekly HD Continue Renavite   DM Recent A1c was 5.3 She is not requiring medications for this issue currently   Dyslipidemia Continue Lipitor   Essential hypertension Continue Toprol-XL   GERD without esophagitis On PPI   Chronic HFrEF (heart failure with reduced ejection fraction)  Echo on 8/22 with EF 30-35%, grade 1 diastolic dysfunction Appears compensated Volume control with HD   CAD R/LHC on 04/06/23 with severe single-vessel CAD with occlusion to vessel too small and distal for PCI Medication management   Severe malnutrition Recently diagnosed during last hospitalization Not following dietary recommendations (Ensure, not abiding by CLD currently per nursing) Nutrition re-consulted  Other Appears likely to need placement, as her roommate situation is not working out per patient  report   Consultants: PT OT Nutrition  Procedures: None  Antibiotics: Ceftriaxone 12/29-    30 Day Unplanned Readmission Risk Score    Flowsheet Row ED to Hosp-Admission (Current) from 08/07/2023 in [facility] Emergency Department at [facility]  30 Day Unplanned Readmission Risk Score (%) 21.94 Filed at 08/09/2023 0801       This score is the patient's risk of an unplanned readmission within 30 days of being discharged (0 -100%). The score is based on dignosis, age, lab data, medications, orders, and past utilization.   Low:  0-14.9   Medium: 15-21.9   High: 22-29.9   Extreme: 30 and above           Subjective: Reports that things have not been going well at home.  Thinks she needs SNF rehab.  Physical Exam: Vitals:   08/09/23 0900 08/09/23 0932 08/09/23 1200 08/09/23 1300  BP: 116/63  (!) 109/98 (!) 122/99  Pulse: 88  86 84  Resp: 16  17 18   Temp:  97.8 F (36.6 C)  98 F (36.7 C)  TempSrc:      SpO2: 100%  98% 99%  Weight:      Height:         No intake or output data in the 24 hours ending 08/09/23 1422 Filed Weights   08/07/23 1431  Weight: 58.1 kg    Exam:  General:  Appears calm and comfortable and is in NAD Eyes:  EOMI, normal lids, iris ENT:  grossly normal hearing, lips & tongue, mmm Neck:  no LAD, masses or thyromegaly Cardiovascular:  RRR. No LE edema.  Respiratory:   CTA bilaterally with no wheezes/rales/rhonchi.  Normal respiratory effort. Abdomen:  soft, NT, ND Skin:  no rash or induration seen on limited exam Musculoskeletal:  grossly normal tone BUE/BLE, good ROM, no bony abnormality Psychiatric:  blunted mood and affect, speech appropriate but with some hesitation, AOx3 Neurologic:  CN 2-12 grossly intact, moves all extremities in coordinated fashion   Data Reviewed: I have reviewed the patient's lab results since admission.  Pertinent labs for today include:   BUN 19/Creatinine 1.18/GFR 53 WBC 9.5 Hgb 9.7 UA: >500  glucose, large Hgb, large LE, 100 protein Urine culture >100k colonies Staph aureus Blood cultures NTD x 1 day    Family Communication: None present  Disposition: Status is: Inpatient Remains inpatient appropriate because: ongoing evaluation and management  Planned Discharge Destination:  TBD    Time spent: 50 minutes  Author: Jonah Blue, MD 08/09/2023 2:22 PM  For on call review [email or web address].

## 2023-08-09 NOTE — Plan of Care (Signed)

## 2023-08-09 NOTE — ED Notes (Signed)
Given breakfast tray. Assisted in repositioning.

## 2023-08-09 NOTE — ED Notes (Signed)
Nephrology at the bedside.

## 2023-08-09 NOTE — ED Notes (Signed)
Patient off and on bedpan without incident; voided. Brief dry.

## 2023-08-09 NOTE — ED Notes (Signed)
This RN into room, rechecked temperature. Asked pt to sit up to take am meds, pt began yelling at this RN "no I will take the medications when I'm ready, you are a liar, I need the phone, you didn't bring me the phone". RN informed pt she had received the phone already and phone was in use by another pt. Educated the pt, that This RN was not "lying" and would give phone again when available. Pt continuing to yell at RN and calling liar.

## 2023-08-09 NOTE — Evaluation (Signed)
Occupational Therapy Evaluation Patient Details Name: Sarah Lane MRN: [id number] DOB: 08-05-1966 Today's Date: 08/09/2023   History of Present Illness Pt is a 57 y/o female admitted secondary to nausea, vomiting and diahrrea. Pt found to have a UTI and sepsis. 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   Clinical Impression   Pt was seen for OT evaluation this date. Prior to hospital admission, pt was living in her roommate's mobile home with ramped entrance. Pt reports indep with ADL, managing her meds, and meal prep. She has transportation provided for HD and her roommate will take her to other dr appts, grocery store, and pharmacy as needed. Pt presents to acute OT demonstrating impaired ADL performance and functional mobility 2/2 decreased strength, activity tolerance, and balance (See OT problem list for additional functional deficits). Pt currently requires MOD A for bed mobility, declines OOB during evaluation despite 2nd person available. Pt becomes tearful and shares more recent health challenges with OT. Active listening and emotional support provided. Pt expresses anxiety over where she will live once she is ready to leave the hospital as she wishes to find alternative living arrangements. Pt would benefit from skilled OT services to address noted impairments and functional limitations (see below for any additional details) in order to maximize safety and independence while minimizing falls risk and caregiver burden.     If plan is discharge home, recommend the following: A lot of help with walking and/or transfers;A little help with bathing/dressing/bathroom;Assistance with cooking/housework;Help with stairs or ramp for entrance;Assist for transportation    Functional Status Assessment  Patient has had a recent decline in their functional status and demonstrates the  ability to make significant improvements in function in a reasonable and predictable amount of time.  Equipment Recommendations   (defer)    Recommendations for Other Services       Precautions / Restrictions Precautions Precautions: Fall Restrictions Weight Bearing Restrictions Per Provider Order: No      Mobility Bed Mobility Overal bed mobility: Needs Assistance Bed Mobility: Supine to Sit, Sit to Supine     Supine to sit: Mod assist          Transfers                   General transfer comment: pt declined      Balance Overall balance assessment: Needs assistance Sitting-balance support: Feet supported Sitting balance-Leahy Scale: Fair                                     ADL either performed or assessed with clinical judgement   ADL Overall ADL's : Needs assistance/impaired                                       General ADL Comments: Pt currently requires MIN A for LB ADL tasks and anticipate increased assist for ADL transfers/mobility 2/2 symptoms.     Vision         Perception         Praxis         Pertinent Vitals/Pain Pain Assessment Pain Assessment: No/denies pain     Extremity/Trunk Assessment Upper Extremity Assessment Upper Extremity Assessment: Generalized  weakness   Lower Extremity Assessment Lower Extremity Assessment: Generalized weakness       Communication Communication Communication: Difficulty communicating thoughts/reduced clarity of speech Cueing Techniques: Verbal cues   Cognition Arousal: Alert Behavior During Therapy: Lability Overall Cognitive Status: No family/caregiver present to determine baseline cognitive functioning                                 General Comments: Pt alert and oriented, tearful at times when speaking about her situation, able to follow simple commands     General Comments       Exercises     Shoulder Instructions      Home  Living Family/patient expects to be discharged to:: Private residence Living Arrangements: Non-relatives/Friends Available Help at Discharge: Friend(s);Available PRN/intermittently Type of Home: Mobile home Home Access: Ramped entrance     Home Layout: One level     Bathroom Shower/Tub: Chief Strategy Officer: Standard     Home Equipment: None   Additional Comments: pt stated that her "roommate" told her she could not use any DME      Prior Functioning/Environment Prior Level of Function : Independent/Modified Independent             Mobility Comments: pt reported that she does not use any DME as her "roommate" does not allow her to ADLs Comments: independent; pt reported that she does not take a shower/bathe but instead does a "wash-up" at the sink. Manages meds with a weekly pill box, indep with meal prep. Pt reports she's unable to assist with household tasks and that her roommate provides transportation to grocery store and pharmacy. Pt has transportation provided for HD.        OT Problem List: Decreased strength;Impaired balance (sitting and/or standing);Decreased safety awareness;Decreased knowledge of use of DME or AE      OT Treatment/Interventions: Self-care/ADL training;Therapeutic exercise;Patient/family education;Therapeutic activities;Cognitive remediation/compensation    OT Goals(Current goals can be found in the care plan section) Acute Rehab OT Goals Patient Stated Goal: get better and find a different place to live OT Goal Formulation: With patient Time For Goal Achievement: 08/23/23 Potential to Achieve Goals: Good ADL Goals Pt Will Perform Upper Body Dressing: sitting;with modified independence Pt Will Perform Lower Body Dressing: sit to/from stand;with supervision Pt Will Transfer to Toilet: ambulating;with supervision (LRAD) Pt Will Perform Toileting - Clothing Manipulation and hygiene: with modified independence Pt/caregiver will  Perform Home Exercise Program: Increased strength;With written HEP provided;Right Upper extremity [facility]) Additional ADL Goal #1: Pt will verbalize plan to implement at least 1 learned falls prevention strategy to maximize safety.  OT Frequency: Min 1X/week    Co-evaluation              AM-PAC OT "6 Clicks" Daily Activity     Outcome Measure Help from another person eating meals?: None Help from another person taking care of personal grooming?: None Help from another person toileting, which includes using toliet, bedpan, or urinal?: A Little Help from another person bathing (including washing, rinsing, drying)?: A Little Help from another person to put on and taking off regular upper body clothing?: None Help from another person to put on and taking off regular lower body clothing?: A Little 6 Click Score: 21   End of Session    Activity Tolerance: Patient limited by fatigue Patient left: in bed;with call bell/phone within reach  OT Visit Diagnosis: Other abnormalities of  gait and mobility (R26.89);Muscle weakness (generalized) (M62.81)                Time: 1610-9604 OT Time Calculation (min): 21 min Charges:  OT General Charges $OT Visit: 1 Visit OT Evaluation $OT Eval Moderate Complexity: 1 Mod  Arman Filter., MPH, MS, OTR/L ascom [phone] 08/09/23, 1:26 PM

## 2023-08-09 NOTE — Progress Notes (Signed)
VAST consult received to assess [facility] with gauze dressing. Pt with tunneled right internal jugular HDC; gauze dressing from HD treatment currently in place. Advised patient's nurse that current dressing may remain in place and it will be changed with dialysis treatment in the morning; pt in agreement.

## 2023-08-10 LAB — CBC WITH DIFFERENTIAL/PLATELET
Abs Immature Granulocytes: 0.09 10*3/uL — ABNORMAL HIGH (ref 0.00–0.07)
Basophils Absolute: 0.1 10*3/uL (ref 0.0–0.1)
Basophils Relative: 1 %
Eosinophils Absolute: 0.6 10*3/uL — ABNORMAL HIGH (ref 0.0–0.5)
Eosinophils Relative: 7 %
HCT: 30.2 % — ABNORMAL LOW (ref 36.0–46.0)
Hemoglobin: 10.1 g/dL — ABNORMAL LOW (ref 12.0–15.0)
Immature Granulocytes: 1 %
Lymphocytes Relative: 26 %
Lymphs Abs: 2.1 10*3/uL (ref 0.7–4.0)
MCH: 31 pg (ref 26.0–34.0)
MCHC: 33.4 g/dL (ref 30.0–36.0)
MCV: 92.6 fL (ref 80.0–100.0)
Monocytes Absolute: 0.8 10*3/uL (ref 0.1–1.0)
Monocytes Relative: 10 %
Neutro Abs: 4.5 10*3/uL (ref 1.7–7.7)
Neutrophils Relative %: 55 %
Platelets: 269 10*3/uL (ref 150–400)
RBC: 3.26 MIL/uL — ABNORMAL LOW (ref 3.87–5.11)
RDW: 14.2 % (ref 11.5–15.5)
WBC: 8.2 10*3/uL (ref 4.0–10.5)
nRBC: 0 % (ref 0.0–0.2)

## 2023-08-10 LAB — BASIC METABOLIC PANEL
Anion gap: 7 (ref 5–15)
BUN: 13 mg/dL (ref 6–20)
CO2: 24 mmol/L (ref 22–32)
Calcium: 9.7 mg/dL (ref 8.9–10.3)
Chloride: 109 mmol/L (ref 98–111)
Creatinine, Ser: 1.07 mg/dL — ABNORMAL HIGH (ref 0.44–1.00)
GFR, Estimated: 60 mL/min — ABNORMAL LOW (ref >60)
Glucose, Bld: 125 mg/dL — ABNORMAL HIGH (ref 70–99)
Potassium: 4.6 mmol/L (ref 3.5–5.1)
Sodium: 140 mmol/L (ref 135–145)

## 2023-08-10 LAB — MRSA NEXT GEN BY PCR, NASAL: MRSA by PCR Next Gen: DETECTED — AB

## 2023-08-10 LAB — GLUCOSE, CAPILLARY
Glucose-Capillary: 110 mg/dL — ABNORMAL HIGH (ref 70–99)
Glucose-Capillary: 116 mg/dL — ABNORMAL HIGH (ref 70–99)
Glucose-Capillary: 162 mg/dL — ABNORMAL HIGH (ref 70–99)
Glucose-Capillary: 104 mg/dL — ABNORMAL HIGH (ref 70–99)

## 2023-08-10 LAB — URINE CULTURE: Culture: >=100000 — AB

## 2023-08-10 MED ORDER — PSYLLIUM 95 % PO PACK
1.0000 | PACK | Freq: Every day | ORAL | Status: DC
  Administered 2023-08-10 – 2023-08-17 (×6): 1 via ORAL
  Filled 2023-08-10 (×9): qty 1

## 2023-08-10 MED ORDER — METRONIDAZOLE 500 MG/100ML IV SOLN
500.0000 mg | Freq: Two times a day (BID) | INTRAVENOUS | Status: DC
  Filled 2023-08-10: qty 100

## 2023-08-10 MED ORDER — POLYETHYLENE GLYCOL 3350 17 G PO PACK
17.0000 g | PACK | Freq: Every day | ORAL | Status: DC
  Administered 2023-08-10 – 2023-08-17 (×3): 17 g via ORAL
  Filled 2023-08-10 (×7): qty 1

## 2023-08-10 MED ORDER — BOOST / RESOURCE BREEZE PO LIQD CUSTOM
1.0000 | Freq: Three times a day (TID) | ORAL | Status: DC
  Administered 2023-08-10 – 2023-08-12 (×3): 1 via ORAL

## 2023-08-10 MED ORDER — SULFAMETHOXAZOLE-TRIMETHOPRIM 400-80 MG PO TABS
1.0000 | ORAL_TABLET | Freq: Two times a day (BID) | ORAL | Status: AC
  Administered 2023-08-10 – 2023-08-16 (×14): 1 via ORAL
  Filled 2023-08-10 (×15): qty 1

## 2023-08-10 MED ORDER — PROSOURCE PLUS PO LIQD
30.0000 mL | Freq: Three times a day (TID) | ORAL | Status: DC
  Administered 2023-08-10 – 2023-08-11 (×3): 30 mL via ORAL
  Filled 2023-08-10 (×9): qty 30

## 2023-08-10 NOTE — Progress Notes (Signed)
 Progress Note   Patient: Sarah Lane FMW:[id number] DOB: [birth year] DOA: 08/07/2023     2 DOS: the patient was seen and examined on 08/10/2023   Brief hospital course: 56yo with h/o asthma, DM, HTN, PSVT, seizure d/o, ESRD recently started on HD, and ovarian CA s/p TAH/BSO who presented on 12/28 with n/v/d.  UA with >100k colonies Staph aureus.   Assessment and Plan:  N/V/D Patient presented with 1 week of n/v/d Imaging with dilated rectum and concern for stercoral colitis She was started on Ceftriaxone  and metronidazole  C diff negative Will order bowel care/disimpaction Needs a good bowel regimen   UTI Sepsis ruled out UA with >100k colonies Staph aureus, MRSA Started on ceftriaxone  but will change to Bactrim  per pharmacy; if unable to tolerate, she may need Zyvox Blood cultures NTD   Recent onset ESRD Stage 3b CKD on admission in 03/2023, bordering on stage 4 Cr 5.85 on admission on 11/29 with anuria not responsive to IVF Vascular surgery consulted for HD access during that admission, s/p Right IJ tunneled dialysis catheter  Initiated inpatient -> outpatient HD  She reports that she is now requiring only twice weekly HD Continue Renavite Per nephrology, she may be making sufficient urine; will order Purewick to quantify UOP since she is incontinent   DM Recent A1c was 5.3 She is not requiring medications for this issue currently   Dyslipidemia Continue Lipitor    Essential hypertension Continue Toprol -XL   GERD without esophagitis On PPI   Chronic HFrEF (heart failure with reduced ejection fraction)  Echo on 8/22 with EF 30-35%, grade 1 diastolic dysfunction Appears compensated Volume control with HD   CAD R/LHC on 04/06/23 with severe single-vessel CAD with occlusion to vessel too small and distal for PCI Medication management   Severe malnutrition Recently diagnosed during last hospitalization Not following dietary recommendations (Ensure, not abiding by  CLD currently per nursing) Nutrition re-consulted   Other Appears likely to need placement, as her roommate situation is not working out per patient report PT/OT evaluated and are recommending SNF rehab      Consultants: PT OT Nutrition   Procedures: None   Antibiotics: Ceftriaxone  12/29-31 Bactrim  12/31-    30 Day Unplanned Readmission Risk Score    Flowsheet Row ED to Hosp-Admission (Current) from 08/07/2023 in [facility] (1A)  30 Day Unplanned Readmission Risk Score (%) 22.19 Filed at 08/10/2023 0401       This score is the patient's risk of an unplanned readmission within 30 days of being discharged (0 -100%). The score is based on dignosis, age, lab data, medications, orders, and past utilization.   Low:  0-14.9   Medium: 15-21.9   High: 22-29.9   Extreme: 30 and above           Subjective: No specific complaints, delighted that she may be going to SNF.   Objective: Vitals:   08/10/23 1230 08/10/23 1304  BP: (!) 116/101 123/83  Pulse: (!) 103 87  Resp: (!) 22 19  Temp: 97.8 F (36.6 C) 98.5 F (36.9 C)  SpO2: 100% 100%    Intake/Output Summary (Last 24 hours) at 08/10/2023 1314 Last data filed at 08/10/2023 1230 Gross per 24 hour  Intake --  Output 1003 ml  Net -1003 ml   Filed Weights   08/07/23 1431 08/10/23 0810 08/10/23 1245  Weight: 58.1 kg 60.3 kg 59.2 kg    Exam:  General:  Appears calm and comfortable and is in NAD, seen  in HD Eyes:  EOMI, normal lids, iris ENT:  grossly normal hearing, lips & tongue, mmm Neck:  no LAD, masses or thyromegaly Cardiovascular:  RRR. No LE edema.  Respiratory:   CTA bilaterally with no wheezes/rales/rhonchi.  Normal respiratory effort. Abdomen:  soft, NT, ND Skin:  no rash or induration seen on limited exam Musculoskeletal:  grossly normal tone BUE/BLE, good ROM, no bony abnormality Psychiatric:  blunted mood and affect, speech appropriate but with some hesitation,  AOx3 Neurologic:  CN 2-12 grossly intact, moves all extremities in coordinated fashion   Data Reviewed: I have reviewed the patient's lab results since admission.  Pertinent labs for today include:   Urine culture: MRSA Glucose 125 BUN 13/Creatinine 1.07/GFR 60 WBC 8.2 Hgb 10.1     Family Communication: None present; I spoke with her sister by telephone  Disposition: Status is: Inpatient Remains inpatient appropriate because: needs placement     Time spent: 35 minutes  Unresulted Labs (From admission, onward)     Start     Ordered   08/11/23 0500  CBC with Differential/Platelet  Tomorrow morning,   R        08/10/23 1314   08/11/23 0500  Basic metabolic panel  Tomorrow morning,   R        08/10/23 1314             Author: Delon Herald, MD 08/10/2023 1:14 PM  For on call review [email or web address].
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
## 2023-08-10 NOTE — Discharge Planning (Signed)
 ESTABLISHED HEMODIALYSIS Outpatient Facility [facility]   [phone] [place], [place] [address] [phone]  Schedule: TTS 11:30  Alan Blumenthal Dialysis Coordinator II  [facility] Cell: [phone] eFax: [phone] [email or web address]

## 2023-08-10 NOTE — Progress Notes (Signed)
Order from provider for disimpaction. However, large bowel movement noted this AM and patient confirmed.  MD notified and no need to disimpact at the present time.

## 2023-08-10 NOTE — Hospital Course (Addendum)
 56yo with h/o asthma, DM, HTN, PSVT, seizure d/o, ESRD recently started on HD, and ovarian CA s/p TAH/BSO who presented on 12/28 with n/v/d.  UA with >100k colonies Staph aureus.  Completed treatment, needs HD cath pulled (currently being monitored for renal recovery without HD), discharged to SNF.

## 2023-08-10 NOTE — Progress Notes (Signed)
 Initial Nutrition Assessment  DOCUMENTATION CODES:   Not applicable  INTERVENTION:   -D/c Ensure Enlive po BID, each supplement provides 350 kcal and 20 grams of protein.  -Boost Breeze po TID, each supplement provides 250 kcal and 9 grams of protein  -30 ml Prosource Plus TID< each supplement provides 100 kcals and 15 grams protein  -Continue renal MVI daily -RD will follow for diet advancement and adjust supplement regimen as appropriate  NUTRITION DIAGNOSIS:   Increased nutrient needs related to acute illness as evidenced by estimated needs.  GOAL:   Patient will meet greater than or equal to 90% of their needs  MONITOR:   PO intake, Supplement acceptance, Diet advancement  REASON FOR ASSESSMENT:   Consult Assessment of nutrition requirement/status  ASSESSMENT:   Pt with medical history significant for asthma, type diabetes mellitus, hypertension, PSVT, seizure disorder and ovarian cancer status post TAH and BSO, who presented to the ER with acute onset of recurrent nausea, vomiting with diarrhea over the last week.  Pt admitted with sepsis due to gram negative UTI and acute gastroenteritis.   Reviewed I/O's: -3 ml x 24 hours  UOP: 1 ml x 24 hours  Pt unavailable at time of visit. Pt down in HD at time of visit. RD unable to obtain further nutrition-related history or complete nutrition-focused physical exam at this time.    Per nephrology notes, pt received HD on a Tuesday, Thursday, Saturday schedule at [facility]. Unsure of EDW at this time. Recently started HD on 07/13/23 secondary to AKI on CKD.   Pt currently on a clear liquid diet. No meal completion data available to assess at this time.   Reviewed wt hx; pt has experienced a 14.6% wt loss over the past 3 months, which is significant for time frame. Noted pt was identified with severe malnutrition during prior hospitalization. RD suspects this is ongoing, however, unable to identify at this time.   Labs  reviewed: Phos: 2.3, Mg: 1.6, CBGS: 99-121 (inpatient orders for glycemic control are 0-5 units insulin  aspart daily at bedtime and 0-9 units insulin  aspart TID with meals).    Diet Order:   Diet Order             Diet clear liquid Room service appropriate? Yes; Fluid consistency: Thin  Diet effective now                   EDUCATION NEEDS:   No education needs have been identified at this time  Skin:  Skin Assessment: Reviewed RN Assessment  Last BM:  08/09/20 (type 6)  Height:   Ht Readings from Last 1 Encounters:  08/07/23 5' (1.524 m)    Weight:   Wt Readings from Last 1 Encounters:  08/07/23 58.1 kg    Ideal Body Weight:  45.5 kg  BMI:  Body mass index is 25 kg/m.  Estimated Nutritional Needs:   Kcal:  1650-1850  Protein:  80-95 grams  Fluid:  1000 ml + UOP    Margery ORN, RD, LDN, CDCES Registered Dietitian III Certified Diabetes Care and Education Specialist If unable to reach this RD, please use RD Inpatient group chat on secure chat between hours of 8am-4 pm daily

## 2023-08-10 NOTE — Plan of Care (Signed)
  Problem: Metabolic: Goal: Ability to maintain appropriate glucose levels will improve Outcome: Progressing   Problem: Skin Integrity: Goal: Risk for impaired skin integrity will decrease Outcome: Progressing   Problem: Tissue Perfusion: Goal: Adequacy of tissue perfusion will improve Outcome: Progressing

## 2023-08-10 NOTE — Discharge Instructions (Addendum)
 Rent/Utility/Housing  Agency Name: [facility] Agency Address: [address] [place], [place] [address] Phone: [phone] Email: [email or web address] Website: [email or web address] Service(s) Offered: Housing services, self-sufficiency, congregate meal program, weatherization program, Field seismologist program, emergency food assistance,  housing counseling, home ownership program, wheels -towork program.  Agency Name: [facility] Address: [address] [place], [place] [address] Phone: [phone] (8a-4p) [phone] (8p- 10p) Email: [email or web address] Website: [email or web address] Service(s) Offered: A program for homeless and/or needy men that includes one-on-one counseling, life skills training and job rehabilitation.  Agency Name: [facility] [place] Address: [address] [place], [place] [address] Phone: [phone] Website: www.[email or web address].org Service(s) Offered: Assistance to needy in emergency with utility bills, heating fuel, and prescriptions. Shelter for homeless 7pm-7am. December 03, 2016 15  Agency Name: [facility] [place] (Developmentally Disabled) Address: [address], [place], [place] [address] Phone: [phone] Contact Person: Cathleen Corti Email: [email or web address] Website: [email or web address] Service(s) Offered: Helps individuals with developmental disabilities move from housing that is more restrictive to homes where they  can achieve greater independence and have more  opportunities.  Agency Name: [facility] Address: [address], [place], [place] [address] Phone: [phone] Email: [email or web address] Website: www.[email or web address].org Service(s) Offered: Provides affordable housing for low-income families, elderly, and disabled individuals. Offer a wide range of  programs and services, from financial planning to  afterschool and summer programs.  Agency Name: [facility] Address: [address] [place], [place] [address] Phone: [phone] Service(s) Offered: Child support services; child welfare services; food stamps; Medicaid; work first family assistance; and aid with fuel,  rent, food and medicine.  Agency Name: [facility] [place] [employer]. Address: [facility] [address] [place], [place]  [address] Phone: [phone] Website: www.familyabuseservices.org Service(s) Offered: 24 hour Crisis Line: [phone]; 24 hour Emergency Shelter; Transitional Housing; Support Groups; Scientist, physiological; [employer]; Hispanic Outreach: [phone];  Visitation Center: [phone].  Agency Name: [facility] [place]. Address: [address] [place], [place] [address] Phone: [phone] Service(s) Offered: CAP Services; Home and [employer]; Individual or Group Supports; Respite Care Non-Institutional Nursing;  Residential Supports; Respite Care and Personal Care Services; Transportation; Family and Friends Night; Recreational Activities; Three Nutritious Meals/Snacks; Consultation with Registered Dietician; Twenty-four hour Registered Nurse Access; Daily and [employer]; Camp [place]aves; [place] for the [employer] (During Summer Months) Bingo Night (Every  Wednesday Night); Special Populations Dance Night  (Every Tuesday Night); Professional Hair Care Services.  Agency Name: [facility] Address: [address], [place], [place] [address] Phone: [phone] Contact Person: Jabier Mutton Website: [email or web address].Physicist, medical) Offered: Residential treatment facility for women; food and  clothing, educational & employment development and  transportation to work; Counsellor of financial skills;  parenting and family reunification; emotional and spiritual  support;  transitional housing for program graduates.  Agency Name: [facility] Address: [address], [place], [place] [address] Phone: [phone] Email: [email or web address] Website: TaskTown.es Service(s) Offered: Public housing units for elderly, disabled, and low income people; housing choice vouchers for income eligible  applicants; shelter plus care vouchers; and Psychologist, clinical.  Agency Name: [facility] [employer] Address: [address] [place], [place] [address] Phone: [phone] Email: [email or web address] Website: www.habitatalamance.org Service(s) Offered: Build houses for families in need of decent housing. Each adult in the family must invest 200 hours of labor on  someone else's house, work with volunteers to build their own house, attend classes  on budgeting, home maintenance, yard care, and attend homeowner association meetings.  Agency Name: [facility] Address: [address], [place], [place] [address] Phone: [phone] Website: www.rsli.org Service(s) Offered: Intermediate care facilities for intellectually delayed, Supervised Living in group homes for adults with developmental disabilities, Supervised Living for people who have dual diagnoses (MRMI), Independent Living, Supported Living, respite and a variety of CAP services, pre-vocational services, day supports, and [employer].  Agency Name: [place] [facility] Phone: [phone] Website: www.NCForeclosurePrevention.gov Service(s) Offered: Zero-interest, deferred loans to homeowners struggling to pay their mortgage. Call for more information.    Transportation Resources  Agency Name: [facility] Agency Address: [address] [place], [place] [address] Phone: [phone] Email: [email or web address] Website: www.[email or web address].org Service(s) Offered: Housing services, self-sufficiency,  congregate meal program, weatherization program, Field seismologist program, emergency food assistance,  housing counseling, home ownership program, wheels-towork program.  Agency Name: [facility] [phone]) Address: [address] [place], [place] [address] Phone: [phone] Website: www.acta-[place].[email or web address] Service(s) Offered: Transportation for [employer], subscription and demand response; Dial-a-Ride for citizens 59 years of age or older.  Agency Name: Department of Social Services Address: [address] [place], [place] [address] Phone: [phone] Service(s) Offered: Child support services; child welfare services; food stamps; Medicaid; work first family assistance; and aid with fuel,  rent, food and medicine, transportation assistance.  Agency Name: Disabled [employer] (DAV) Transportation  Network Phone: [phone] Service(s) Offered: Transports veterans to the [facility] medical center. Call  forty-eight hours in advance and leave the name, telephone  number, date, and time of appointment. Veteran will be  contacted by the driver the day before the appointment to  arrange a pick up point   Transportation Resources  Agency Name: [facility] Agency Address: [address] [place], [place] [address] Phone: [phone] Email: [email or web address] Website: www.[email or web address].org Service(s) Offered: Housing services, self-sufficiency, congregate meal program, weatherization program, Field seismologist program, emergency food assistance,  housing counseling, home ownership program, wheels-towork program.  Agency Name: [facility] [phone]) Address: [address], [place], [place] [address] Phone: [phone] Website: www.acta-[place].[email or web address] Service(s) Offered: Transportation for [employer], subscription and demand response; Dial-a-Ride for  citizens 48 years of age or older.  Agency Name: Department of Social Services Address: [address] [place], [place] [address] Phone: [phone] Service(s) Offered: Child support services; child welfare services; food stamps; Medicaid; work first family assistance; and aid with fuel,  rent, food and medicine, transportation assistance.  Agency Name: Disabled [employer] (DAV) Transportation  Network Phone: [phone] Service(s) Offered: Transports veterans to the [facility] medical center. Call  forty-eight hours in advance and leave the name, telephone  number, date, and time of appointment. Veteran will be  contacted by the driver the day before the appointment to  arrange a pick up point    [employer] ACTA currently provides door to door services. ACTA connects with PART daily for services to [facility]. ACTA also performs contract services to [employer] operates 27 vehicles, all but 3 mini-vans are equipped with lifts for special needs as well as the general public. ACTA drivers are each CDL certified and trained in First Aid and CPR. ACTA was established in 2002 by [employer]. An independent Industrial/product designer. ACTA operates via Cytogeneticist with required Research scientist (physical sciences) from [place]. ACTA provides over 80,000 passenger trips each year,  including [place] [facility] and [employer] sites.  Call at least by 11 AM one business day prior to needing transportation  [employer].                      [place], [place] [address]     Office Hours: Monday-Friday  8 AM - 5 PM    Food Resources  Agency Name: [facility] Agency Address: [address] [place], [place] [address] Phone: [phone] Website: www.alamanceservices.org Service(s) Offered: Housing services, self-sufficiency, congregate meal  program, weatherization program, Event organiser program, emergency food assistance,  housing counseling, home ownership program, wheels - to work program.  Dole Food free for 60 and older at various locations from [employer], Monday-Friday:  [employer], [address] [place], [phone] -[facility], [address], [place] [phone]  -[facility], [address], [place] [phone]  -[address], [place], [phone]  Agency Name: [facility] Address: [phone] [address], [place], [place] [address] Phone: [phone] Website: www.alamancemow.org Service(s) Offered: Home delivered hot, frozen, and emergency  meals. Grocery assistance program which matches  volunteers one-on-one with seniors unable to grocery shop  for themselves. Must be 60 years and older; less than 20  hours of in-home aide service, limited or no driving ability;  live alone or with someone with a disability; live in  [place].  Agency Name: [facility]ologist [facility]) Address: [address], [place], [place] [address] Phone: [phone] Service(s) Offered: Food is served to shut-ins, homeless, elderly, and low income people in the community every Saturday (11:30 am-12:30 pm) and Sunday (12:30 pm-1:30pm). Volunteers also offer help and encouragement in seeking employment,  and spiritual guidance.  Agency Name: Department of Social Services Address: [address] [place], [place] [address] Phone: [phone] Service(s) Offered: Child support services; child welfare services; food stamps; Medicaid; work first family assistance; and aid with fuel,  rent, food and medicine.  Agency Name: [facility] Address: [address], [place], [place] Phone: [phone] Website: www.[email or web address] Services Offered: Monday 10:00am-12:00, 8:00pm-9:00pm, and Friday  10:00am-12:00.  Agency Name: [facility] [place] Address: [address] [place], [place] [address] Phone: [phone] Website: www.alliedchurches.org Service(s) Offered: Serves weekday meals, open from 11:30 am- 1:00 pm., and 6:30-7:30pm, Monday-Wednesday-Friday distributes food 3:30-6pm, Monday-Wednesday-Friday.  Agency Name: [facility] Address: [address] [place], [place] Phone: [phone] Website: [email or web address].[email or web address] Services Offered: Distributes food the 4th Saturday of the month, starting at 8:00 am  Agency Name: [facility] Address: [phone] [address], [place], [place] [address] Phone: [phone] Website: [email or web address] Service(s) Offered: Bread of life, weekly food pantry. Open Wednesdays from 10:00am-noon.  Agency Name: [facility] Address: [address] [place], [place], [place] Phone: [phone] Services Offered: Distributes food 9am-1pm, Monday-Thursday. Call for details.  Agency Name: [facility] Address: [address] [place], [place] [address] Phone: [phone] Website: firstbaptistburlington.[email or web address] Service(s) Offered: Games developer. Call for assistance.  Agency Name: [facility] Address: [address] [place], [place] [address] Phone: [phone] Service Offered: Emergency Food Pantry. Call for appointment.  Agency Name: [facility] Address: [address], [place], [place] [address] Phone: [phone] Website: [email or web address] Services Offered: Games developer. Call for details  Agency Name: [facility] Address: [address] [place], [place] Phone: [phone] Website: [email or web address] Service(s) Offered: Emergency Food Pantry.  Call for details.  Agency Name: Holiday representative Address: [address] [place], [place] [address] Phone: [phone] or [phone] Website:  [email or web address] Service(s) Offered: Distribute food 9am-11:30 am, Tuesday-Friday, and 1-3:30pm, Monday-Friday. Food pantry Monday-Friday 1pm-3pm, fresh items, Mon.-Wed.-Fri.  Agency Name: [facility] Address: [address] [place], [place] [address] Phone: [phone] Website: www.safealamance.org Services Offered: Distribute food Tues and Sats from 9:00am-noon. Closed 1st Saturday of each month. Call for details  Agency Name: [employer] [employer] Address: Reyn[address]do [address] [facility] [address], [place] [address] Phone: [phone]  Services Offered: Delivers meals every Thursday

## 2023-08-10 NOTE — Progress Notes (Signed)
  Received patient in bed to unit.   Informed consent signed and in chart.    TX duration: 3.5hrs     Transported back to floor  Hand-off given to patient's nurse. No c/o and  no  distress noted    Access used: R HD Catheter  Access issues: lines reversed   Total UF removed: 1.0L Medication(s) given: none Post HD VS: WNL Post HD weight: 59.2kg     Sarah Lane  [facility] D[facility] Unit

## 2023-08-10 NOTE — NC FL2 (Signed)
 [place]  MEDICAID FL2 LEVEL OF CARE FORM     IDENTIFICATION  Patient Name: Sarah Lane Birthdate: 18-Apr-1966 Sex: female Admission Date (Current Location): 08/07/2023  [place] Number:  Chiropodist and Address:  [facility], [address] [place], [place] [address]      Provider Number:    Attending Physician Name and Address:  Barbarann Nest, MD  Relative Name and Phone Number:  Renata Gambino  Mother  Emergency Contact  [phone]    Current Level of Care:   Recommended Level of Care:   Prior Approval Number:    Date Approved/Denied:   PASRR Number: [id number]  Discharge Plan: SNF    Current Diagnoses: Patient Active Problem List   Diagnosis Date Noted   Nausea vomiting and diarrhea 08/07/2023   Uncontrolled type 2 diabetes mellitus with hyperglycemia, without long-term current use of insulin  (HCC) 08/07/2023   ESRD on dialysis (HCC) 08/07/2023   Protein-calorie malnutrition, severe 07/16/2023   Hypokalemia 07/13/2023   Encounter for insertion of tunneled central venous catheter (CVC) with port 07/12/2023   Hyponatremia 07/11/2023   UTI (urinary tract infection) 07/11/2023   Non-insulin  dependent type 2 diabetes mellitus (HCC) 07/10/2023   GERD without esophagitis 07/10/2023   Hypoglycemia 07/10/2023   Acute kidney injury superimposed on chronic kidney disease (HCC) 07/09/2023   Incisional hernia, without obstruction or gangrene 07/06/2023   Peritoneal adhesions (postprocedural) (postinfection) 06/23/2023   CVA, old, hemiparesis (HCC) 06/03/2023   AKI (acute kidney injury) (HCC) 06/02/2023   Acute gastritis 06/02/2023   Acute lower UTI 06/02/2023   Dyslipidemia 06/02/2023   Type 2 diabetes mellitus without complications (HCC) 06/02/2023   Essential hypertension 06/02/2023   Lactic acidosis 04/07/2023   Demand ischemia (HCC) 04/06/2023   High anion gap metabolic acidosis 04/05/2023   Acute  encephalopathy 04/05/2023   Chronic HFrEF (heart failure with reduced ejection fraction) (HCC) 04/05/2023   HFrEF (heart failure with reduced ejection fraction) (HCC) 04/04/2023   Sepsis (HCC) 03/31/2023   Acute renal failure (HCC) 03/31/2023   Diabetes (HCC) 12/31/2014   Carbuncle of abdominal wall May 2016 12/31/2014    Orientation RESPIRATION BLADDER Height & Weight     Self, Time, Situation, Place  Normal Continent Weight: 59.2 kg Height:  5' (152.4 cm)  BEHAVIORAL SYMPTOMS/MOOD NEUROLOGICAL BOWEL NUTRITION STATUS      Continent Diet (see DC summary)  AMBULATORY STATUS COMMUNICATION OF NEEDS Skin   Extensive Assist Verbally Normal                       Personal Care Assistance Level of Assistance  Bathing, Feeding, Dressing Bathing Assistance: Limited assistance Feeding assistance: Limited assistance Dressing Assistance: Limited assistance     Functional Limitations Info  Sight, Hearing, Speech Sight Info: Adequate Hearing Info: Adequate Speech Info: Impaired    SPECIAL CARE FACTORS FREQUENCY  PT (By licensed PT), OT (By licensed OT)     PT Frequency: 5 times per week OT Frequency: 5 times per week            Contractures Contractures Info: Not present    Additional Factors Info  Code Status, Allergies Code Status Info: full code Allergies Info: Jardiance (Empagliflozin), Codeine, Ibuprofen           Current Medications (08/10/2023):  This is the current hospital active medication list Current Facility-Administered Medications  Medication Dose Route Frequency Provider Last Rate Last Admin   (feeding supplement) PROSource Plus liquid 30  mL  30 mL Oral TID BM Barbarann Nest, MD       acetaminophen  (TYLENOL ) tablet 650 mg  650 mg Oral Q6H PRN Mansy, Jan A, MD       Or   acetaminophen  (TYLENOL ) suppository 650 mg  650 mg Rectal Q6H PRN Mansy, Jan A, MD       acidophilus (RISAQUAD) capsule 1 capsule  1 capsule Oral Daily Vann, Jessica U, DO   1  capsule at 08/10/23 1327   aspirin  chewable tablet 81 mg  81 mg Oral Daily Mansy, Jan A, MD   81 mg at 08/10/23 1327   atorvastatin  (LIPITOR ) tablet 80 mg  80 mg Oral Daily Mansy, Jan A, MD   80 mg at 08/10/23 1327   Chlorhexidine  Gluconate Cloth 2 % PADS 6 each  6 each Topical Q0600 Druscilla Bald, NP   6 each at 08/10/23 0533   feeding supplement (BOOST / RESOURCE BREEZE) liquid 1 Container  1 Container Oral TID BM Barbarann Nest, MD       heparin  injection 5,000 Units  5,000 Units Subcutaneous Q8H Yates, Jennifer, MD   5,000 Units at 08/10/23 1327   insulin  aspart (novoLOG ) injection 0-5 Units  0-5 Units Subcutaneous QHS Vann, Jessica U, DO       insulin  aspart (novoLOG ) injection 0-9 Units  0-9 Units Subcutaneous TID WC Vann, Jessica U, DO   1 Units at 08/08/23 1645   loperamide  (IMODIUM ) capsule 2 mg  2 mg Oral PRN Mansy, Jan A, MD   2 mg at 08/08/23 2120   metoprolol  succinate (TOPROL -XL) 24 hr tablet 12.5 mg  12.5 mg Oral Daily Mansy, Jan A, MD   12.5 mg at 08/10/23 1327   multivitamin (RENA-VIT) tablet 1 tablet  1 tablet Oral QHS Mansy, Jan A, MD   1 tablet at 08/09/23 2300   ondansetron  (ZOFRAN ) tablet 4 mg  4 mg Oral Q6H PRN Mansy, Jan A, MD       Or   ondansetron  (ZOFRAN ) injection 4 mg  4 mg Intravenous Q6H PRN Mansy, Jan A, MD       pantoprazole  (PROTONIX ) EC tablet 40 mg  40 mg Oral Daily Mansy, Jan A, MD   40 mg at 08/10/23 1327   polyethylene glycol (MIRALAX  / GLYCOLAX ) packet 17 g  17 g Oral Daily Barbarann Nest, MD   17 g at 08/10/23 1426   psyllium (HYDROCIL/METAMUCIL) 1 packet  1 packet Oral Daily Barbarann Nest, MD   1 packet at 08/10/23 1426   sulfamethoxazole -trimethoprim  (BACTRIM ) 400-80 MG per tablet 1 tablet  1 tablet Oral Q12H Yates, Jennifer, MD   1 tablet at 08/10/23 1426   traZODone  (DESYREL ) tablet 25 mg  25 mg Oral QHS PRN Mansy, Madison LABOR, MD         Discharge Medications: Please see discharge summary for a list of discharge medications.  Relevant Imaging  Results:  Relevant Lab Results:   Additional Information SS# [id number]  [name], RN

## 2023-08-10 NOTE — Progress Notes (Signed)
 [facility]  ROUNDING NOTE   Subjective:   Sarah Lane  is a 57 y.o. female with a PMHx of diabetes mellitus type 2, seizure disorder, hypertension, dysrhythmia, and history of ovarian cancer.  Patient presents to the emergency department with nausea and vomiting. She has been admitted for Acute gastroenteritis [K52.9] UTI (urinary tract infection) [N39.0] ESRD on hemodialysis (HCC) [N18.6, Z99.2] Cystitis [N30.90] Stercoral colitis [K52.89]  Patient is known to our practice and receives outpatient dialysis treatments at [facility] on a Tuesday/Saturday schedule.   Patient seen and evaluated during dialysis   HEMODIALYSIS FLOWSHEET:  Blood Flow Rate (mL/min): 349 mL/min Arterial Pressure (mmHg): -152.11 mmHg Venous Pressure (mmHg): 180.6 mmHg TMP (mmHg): 9.29 mmHg Ultrafiltration Rate (mL/min): 720 mL/min Dialysate Flow Rate (mL/min): 299 ml/min  Tolerating treatment well States diarrhea has decreased  Objective:  Vital signs in last 24 hours:  Temp:  [97.6 F (36.4 C)-98.9 F (37.2 C)] 97.6 F (36.4 C) (12/31 0819) Pulse Rate:  [72-95] 95 (12/31 1100) Resp:  [13-28] 28 (12/31 1100) BP: (105-146)/(54-111) 146/90 (12/31 1100) SpO2:  [98 %-100 %] 100 % (12/31 1100)  Weight change:  Filed Weights   08/07/23 1431  Weight: 58.1 kg    Intake/Output: I/O last 3 completed shifts: In: -  Out: 3 [Urine:1; Stool:2]   Intake/Output this shift:  No intake/output data recorded.  Physical Exam: General: NAD  Head: Normocephalic, atraumatic.  Dry oral mucosal membranes  Eyes: Anicteric  Lungs:  Clear to auscultation, normal effort  Heart: Regular rate and rhythm  Abdomen:  Soft, nontender, nondistended  Extremities: No peripheral edema.  Neurologic: Alert and oriented, moving all four extremities  Skin: No lesions  Access: Rt chest permcath    Basic Metabolic Panel: Recent Labs  Lab 08/07/23 1435 08/08/23 0540 08/09/23 0621 08/10/23 0846   NA 137 138 138 140  K 2.7* 2.7* 5.0 4.6  CL 97* 104 108 109  CO2 27 27 25 24   GLUCOSE 312* 137* 100* 125*  BUN 27* 24* 19 13  CREATININE 1.78* 1.39* 1.18* 1.07*  CALCIUM  10.2 9.5 9.1 9.7  MG  --  1.6*  --   --     Liver Function Tests: Recent Labs  Lab 08/07/23 1435  AST 20  ALT 15  ALKPHOS 76  BILITOT 1.2*  PROT 7.0  ALBUMIN 3.7   Recent Labs  Lab 08/07/23 1435  LIPASE 35    No results for input(s): AMMONIA in the last 168 hours.  CBC: Recent Labs  Lab 08/07/23 1435 08/08/23 0540 08/09/23 0621 08/10/23 0846  WBC 15.7* 11.2* 9.5 8.2  NEUTROABS  --   --   --  4.5  HGB 12.3 9.8* 9.7* 10.1*  HCT 35.4* 28.6* 28.6* 30.2*  MCV 88.3 88.3 88.8 92.6  PLT 312 231 232 269    Cardiac Enzymes: No results for input(s): CKTOTAL, CKMB, CKMBINDEX, TROPONINI in the last 168 hours.  BNP: Invalid input(s): POCBNP  CBG: Recent Labs  Lab 08/09/23 0750 08/09/23 1154 08/09/23 1631 08/09/23 2109 08/10/23 0801  GLUCAP 104* 112* 99 123* 110*    Microbiology: Results for orders placed or performed during the hospital encounter of 08/07/23  C Difficile Quick Screen w PCR reflex     Status: None   Collection Time: 08/07/23  6:59 PM   Specimen: STOOL  Result Value Ref Range Status   C Diff antigen NEGATIVE NEGATIVE Final   C Diff toxin NEGATIVE NEGATIVE Final   C Diff interpretation No  C. difficile detected.  Final    Comment: Performed at [facility], [address] [place], [place] [address]  Urine Culture (for pregnant, neutropenic or urologic patients or patients with an indwelling urinary catheter)     Status: Abnormal   Collection Time: 08/07/23  8:20 PM   Specimen: Urine, Catheterized  Result Value Ref Range Status   Specimen Description   Final    URINE, CATHETERIZED Performed at [facility], [address] [place], [place] [address]    Special Requests   Final    NONE Performed at [facility], [address] [place], [place] [address]    Culture (A)  Final    >=100,000 COLONIES/mL METHICILLIN RESISTANT STAPHYLOCOCCUS AUREUS   Report Status 08/10/2023 FINAL  Final   Organism ID, Bacteria METHICILLIN RESISTANT STAPHYLOCOCCUS AUREUS (A)  Final      Susceptibility   Methicillin resistant staphylococcus aureus - MIC*    CIPROFLOXACIN >=8 RESISTANT Resistant     GENTAMICIN <=0.5 SENSITIVE Sensitive     NITROFURANTOIN <=16 SENSITIVE Sensitive     OXACILLIN >=4 RESISTANT Resistant     TETRACYCLINE <=1 SENSITIVE Sensitive     VANCOMYCIN  1 SENSITIVE Sensitive     TRIMETH /SULFA  <=10 SENSITIVE Sensitive     RIFAMPIN <=0.5 SENSITIVE Sensitive     Inducible Clindamycin NEGATIVE Sensitive     LINEZOLID 2 SENSITIVE Sensitive     * >=100,000 COLONIES/mL METHICILLIN RESISTANT STAPHYLOCOCCUS AUREUS  Culture, blood (Routine X 2) w Reflex to ID Panel     Status: None (Preliminary result)   Collection Time: 08/07/23 11:29 PM   Specimen: BLOOD  Result Value Ref Range Status   Specimen Description BLOOD BLOOD RIGHT ARM  Final   Special Requests   Final    BOTTLES DRAWN AEROBIC AND ANAEROBIC Blood Culture results may not be optimal due to an inadequate volume of blood received in culture bottles   Culture   Final    NO GROWTH 2 DAYS Performed at [facility], [address], [place], [place] [address]    Report Status PENDING  Incomplete  Culture, blood (Routine X 2) w Reflex to ID Panel     Status: None (Preliminary result)   Collection Time: 08/07/23 11:29 PM   Specimen: BLOOD  Result Value Ref Range Status   Specimen Description BLOOD BLOOD LEFT ARM  Final   Special Requests   Final    BOTTLES DRAWN AEROBIC AND ANAEROBIC Blood Culture results may not be optimal due to an inadequate volume of blood received in culture bottles   Culture   Final    NO GROWTH 2 DAYS Performed at [facility], [address] [place], [place] [address]    Report Status PENDING  Incomplete  MRSA  Next Gen by PCR, Nasal     Status: Abnormal   Collection Time: 08/10/23  6:00 AM   Specimen: Nasal Mucosa; Nasal Swab  Result Value Ref Range Status   MRSA by PCR Next Gen DETECTED (A) NOT DETECTED Final    Comment: RESULT CALLED TO, READ BACK BY AND VERIFIED WITH: Ave, Katrina 08/10/23 0732 MW (NOTE) The GeneXpert MRSA Assay (FDA approved for NASAL specimens only), is one component of a comprehensive MRSA colonization surveillance program. It is not intended to diagnose MRSA infection nor to guide or monitor treatment for MRSA infections. Test performance is not FDA approved in patients less than 29 years old. Performed at [facility], [address]  [place], [place] [address]     Coagulation Studies: Recent Labs    08/08/23 0540  LABPROT 14.1  INR 1.1    Urinalysis: Recent Labs    08/07/23 2020  COLORURINE YELLOW*  LABSPEC >1.046*  PHURINE 5.0  GLUCOSEU >=500*  HGBUR LARGE*  BILIRUBINUR NEGATIVE  KETONESUR NEGATIVE  PROTEINUR 100*  NITRITE NEGATIVE  LEUKOCYTESUR LARGE*       Imaging: No results found.    Medications:    cefTRIAXone  (ROCEPHIN )  IV 2 g (08/09/23 2300)   metronidazole  500 mg (08/09/23 2309)     (feeding supplement) PROSource Plus  30 mL Oral TID BM   acidophilus  1 capsule Oral Daily   aspirin   81 mg Oral Daily   atorvastatin   80 mg Oral Daily   Chlorhexidine  Gluconate Cloth  6 each Topical Q0600   feeding supplement  1 Container Oral TID BM   heparin  injection (subcutaneous)  5,000 Units Subcutaneous Q8H   insulin  aspart  0-5 Units Subcutaneous QHS   insulin  aspart  0-9 Units Subcutaneous TID WC   metoprolol  succinate  12.5 mg Oral Daily   multivitamin  1 tablet Oral QHS   pantoprazole   40 mg Oral Daily   acetaminophen  **OR** acetaminophen , loperamide , ondansetron  **OR** ondansetron  (ZOFRAN ) IV, traZODone   Assessment/ Plan:  [name] is a 57 y.o.  female  is a 57 y.o. female with a PMHx of diabetes  mellitus type 2, seizure disorder, hypertension, dysrhythmia, and history of ovarian cancer.  Patient presents to the emergency department complaining of nausea, vomiting, and diarrhea.  Patient has been admitted for Acute gastroenteritis [K52.9] UTI (urinary tract infection) [N39.0] ESRD on hemodialysis (HCC) [N18.6, Z99.2] Cystitis [N30.90] Stercoral colitis [K52.89]  [facility]/T-S/right chest PermCath  Acute Kidney Injury on chronic kidney disease stage IIIa with baseline creatinine 1.27 and GFR of 49 on 04/07/23.  Now receiving hemodialysis, initiated on 12/3. Acute kidney injury resulting in ATN secondary to volume depletion and continued ACE/ARB use. Receiving dialysis treatment today, UF 0. Requesting strict intake and output with purwick system to monitor urine output. Will assess for renal recovery.  Lab Results  Component Value Date   CREATININE 1.07 (H) 08/10/2023   CREATININE 1.18 (H) 08/09/2023   CREATININE 1.39 (H) 08/08/2023    Intake/Output Summary (Last 24 hours) at 08/10/2023 1116 Last data filed at 08/09/2023 2224 Gross per 24 hour  Intake --  Output 3 ml  Net -3 ml    2.  Hypokalemia.  Potassium 2.7 on admission, corrected with oral supplementation.  3. Anemia of chronic kidney disease Lab Results  Component Value Date   HGB 10.1 (L) 08/10/2023    Hemoglobin 10.1, at goal.  Will continue to monitor for need of ESA's  4. Diabetes mellitus type II with chronic kidney disease: noninsulin dependent. Home regimen includes metformin. Most recent hemoglobin A1c is 8.2 on 03/31/23.   Glucose well controlled.   5. Secondary Hyperparathyroidism: with outpatient labs: PTH 153, phosphorus 3.8, calcium  10.8 on 07/29/23.   Lab Results  Component Value Date   CALCIUM  9.7 08/10/2023   CAION 1.22 04/06/2023   PHOS 2.3 (L) 07/17/2023    Phosphorus decreased but stable. Will continue to monitor.    LOS: 2 Sarah Lane 12/31/202411:16 AM

## 2023-08-11 DIAGNOSIS — R112 Nausea with vomiting, unspecified: Secondary | ICD-10-CM | POA: Diagnosis not present

## 2023-08-11 DIAGNOSIS — R197 Diarrhea, unspecified: Secondary | ICD-10-CM | POA: Diagnosis not present

## 2023-08-11 LAB — CBC WITH DIFFERENTIAL/PLATELET
Abs Immature Granulocytes: 0.3 10*3/uL — ABNORMAL HIGH (ref 0.00–0.07)
Basophils Absolute: 0.1 10*3/uL (ref 0.0–0.1)
Basophils Relative: 1 %
Eosinophils Absolute: 0.3 10*3/uL (ref 0.0–0.5)
Eosinophils Relative: 4 %
HCT: 31.1 % — ABNORMAL LOW (ref 36.0–46.0)
Hemoglobin: 10.4 g/dL — ABNORMAL LOW (ref 12.0–15.0)
Immature Granulocytes: 4 %
Lymphocytes Relative: 29 %
Lymphs Abs: 2.3 10*3/uL (ref 0.7–4.0)
MCH: 30 pg (ref 26.0–34.0)
MCHC: 33.4 g/dL (ref 30.0–36.0)
MCV: 89.6 fL (ref 80.0–100.0)
Monocytes Absolute: 0.8 10*3/uL (ref 0.1–1.0)
Monocytes Relative: 10 %
Neutro Abs: 4.3 10*3/uL (ref 1.7–7.7)
Neutrophils Relative %: 52 %
Platelets: 268 10*3/uL (ref 150–400)
RBC: 3.47 MIL/uL — ABNORMAL LOW (ref 3.87–5.11)
RDW: 14 % (ref 11.5–15.5)
WBC: 8 10*3/uL (ref 4.0–10.5)
nRBC: 0 % (ref 0.0–0.2)

## 2023-08-11 LAB — GLUCOSE, CAPILLARY
Glucose-Capillary: 113 mg/dL — ABNORMAL HIGH (ref 70–99)
Glucose-Capillary: 130 mg/dL — ABNORMAL HIGH (ref 70–99)
Glucose-Capillary: 119 mg/dL — ABNORMAL HIGH (ref 70–99)
Glucose-Capillary: 121 mg/dL — ABNORMAL HIGH (ref 70–99)

## 2023-08-11 LAB — BASIC METABOLIC PANEL
Anion gap: 9 (ref 5–15)
BUN: 7 mg/dL (ref 6–20)
CO2: 24 mmol/L (ref 22–32)
Calcium: 8.9 mg/dL (ref 8.9–10.3)
Chloride: 102 mmol/L (ref 98–111)
Creatinine, Ser: 0.97 mg/dL (ref 0.44–1.00)
GFR, Estimated: >60 mL/min (ref >60)
Glucose, Bld: 114 mg/dL — ABNORMAL HIGH (ref 70–99)
Potassium: 3.8 mmol/L (ref 3.5–5.1)
Sodium: 135 mmol/L (ref 135–145)

## 2023-08-11 NOTE — Progress Notes (Signed)
 Progress Note   Patient: Sarah Lane FMW:[id number] DOB: 1965-12-26 DOA: 08/07/2023     3 DOS: the patient was seen and examined on 08/11/2023   Brief hospital course: 58yo with h/o asthma, DM, HTN, PSVT, seizure d/o, ESRD recently started on HD, and ovarian CA s/p TAH/BSO who presented on 12/28 with n/v/d.  UA with >100k colonies Staph aureus.   Assessment and Plan:  N/V/D Patient presented with 1 week of n/v/d Imaging with dilated rectum and concern for stercoral colitis She was started on Ceftriaxone  and metronidazole  -> Bactrim  C diff negative Will order bowel care/disimpaction Needs a good bowel regimen   UTI Sepsis ruled out UA with >100k colonies Staph aureus, MRSA Started on ceftriaxone  but will change to Bactrim  per pharmacy; if unable to tolerate, she may need Zyvox Blood cultures NTD   Recent onset ESRD Stage 3b CKD on admission in 03/2023, bordering on stage 4 Cr 5.85 on admission on 11/29 with anuria not responsive to IVF Vascular surgery consulted for HD access during that admission, s/p Right IJ tunneled dialysis catheter  Initiated inpatient -> outpatient HD  She reports that she is now requiring only twice weekly HD Continue Renavite Per nephrology, she may be making sufficient urine for renal recovery; has Purewick in order to quantify UOP since she is incontinent   DM Recent A1c was 5.3 She is not requiring medications for this issue currently   Dyslipidemia Continue Lipitor    Essential hypertension Continue Toprol -XL   GERD without esophagitis On PPI   Chronic HFrEF (heart failure with reduced ejection fraction)  Echo on 8/22 with EF 30-35%, grade 1 diastolic dysfunction Appears compensated Volume control with HD   CAD R/LHC on 04/06/23 with severe single-vessel CAD with occlusion to vessel too small and distal for PCI Medication management   Nutrition Status: Nutrition Problem: Increased nutrient needs Etiology: acute  illness Signs/Symptoms: estimated needs Interventions: Boost Breeze, MVI, Prostat    Other Appears likely to need additional housing, as her roommate situation is not working out per patient report PT/OT evaluated and are recommending SNF rehab She will also need assistance with housing resources post-rehab       Consultants: PT OT Nutrition   Procedures: None   Antibiotics: Ceftriaxone  12/29-31 Bactrim  12/31-1/6      30 Day Unplanned Readmission Risk Score    Flowsheet Row ED to Hosp-Admission (Current) from 08/07/2023 in [facility] (1A)  30 Day Unplanned Readmission Risk Score (%) 21.63 Filed at 08/11/2023 0801       This score is the patient's risk of an unplanned readmission within 30 days of being discharged (0 -100%). The score is based on dignosis, age, lab data, medications, orders, and past utilization.   Low:  0-14.9   Medium: 15-21.9   High: 22-29.9   Extreme: 30 and above           Subjective: No current complaints other than loose stools.   Objective: Vitals:   08/11/23 0010 08/11/23 0735  BP: (!) 88/48 108/68  Pulse: 87 77  Resp: 20 16  Temp: (!) 97.2 F (36.2 C) 98.3 F (36.8 C)  SpO2: 100% 97%    Intake/Output Summary (Last 24 hours) at 08/11/2023 1403 Last data filed at 08/11/2023 0600 Gross per 24 hour  Intake 1230 ml  Output 1 ml  Net 1229 ml   Filed Weights   08/07/23 1431 08/10/23 0810 08/10/23 1245  Weight: 58.1 kg 60.3 kg 59.2 kg  Exam:  General:  Appears calm and comfortable and is in NAD, working a puzzle on her phone throughout Eyes:  EOMI, normal lids, iris ENT:  grossly normal hearing, lips & tongue, mmm Neck:  no LAD, masses or thyromegaly Cardiovascular:  RRR. No LE edema.  Respiratory:   CTA bilaterally with no wheezes/rales/rhonchi.  Normal respiratory effort. Abdomen:  soft, NT, ND Skin:  no rash or induration seen on limited exam Musculoskeletal:  grossly normal tone  BUE/BLE, good ROM, no bony abnormality Psychiatric:  blunted mood and affect, speech appropriate but with some hesitation, AOx3 Neurologic:  CN 2-12 grossly intact, moves all extremities in coordinated fashion  Data Reviewed: I have reviewed the patient's lab results since admission.  Pertinent labs for today include:   Glucose 114 WBC 8 Hgb 10.4, stable     Family Communication: None present  Disposition: Status is: Inpatient Remains inpatient appropriate because: awaiting placement     Time spent: 25 minutes  Unresulted Labs (From admission, onward)    None        Author: Delon Herald, MD 08/11/2023 2:03 PM  For on call review [email or web address].

## 2023-08-11 NOTE — Plan of Care (Signed)
  Problem: Fluid Volume: Goal: Ability to maintain a balanced intake and output will improve Outcome: Progressing   Problem: Metabolic: Goal: Ability to maintain appropriate glucose levels will improve Outcome: Progressing   

## 2023-08-11 NOTE — Progress Notes (Signed)
 [facility]  ROUNDING NOTE   Subjective:   Sarah Lane  is a 57 y.o. female with a PMHx of diabetes mellitus type 2, seizure disorder, hypertension, dysrhythmia, and history of ovarian cancer.  Patient presents to the emergency department with nausea and vomiting. She has been admitted for Acute gastroenteritis [K52.9] UTI (urinary tract infection) [N39.0] ESRD on hemodialysis (HCC) [N18.6, Z99.2] Cystitis [N30.90] Stercoral colitis [K52.89]  Hemodialysis treatment yesterday.   Currently with 24 hour urine collection  Objective:  Vital signs in last 24 hours:  Temp:  [97.2 F (36.2 C)-98.5 F (36.9 C)] 98.3 F (36.8 C) (01/01 0735) Pulse Rate:  [77-103] 77 (01/01 0735) Resp:  [15-22] 16 (01/01 0735) BP: (88-142)/(48-105) 108/68 (01/01 0735) SpO2:  [97 %-100 %] 97 % (01/01 0735) Weight:  [59.2 kg] 59.2 kg (12/31 1245)  Weight change:  Filed Weights   08/07/23 1431 08/10/23 0810 08/10/23 1245  Weight: 58.1 kg 60.3 kg 59.2 kg    Intake/Output: I/O last 3 completed shifts: In: 1230 [P.O.:1230] Out: 1004 [Urine:2; Other:1000; Stool:2]   Intake/Output this shift:  No intake/output data recorded.  Physical Exam: General: NAD, laying in bed  Head: Normocephalic, atraumatic.  Dry oral mucosal membranes  Eyes: Anicteric  Lungs:  Clear to auscultation, normal effort  Heart: Regular rate and rhythm  Abdomen:  Soft, nontender, nondistended  Extremities: No peripheral edema.  Neurologic: Alert and oriented, moving all four extremities  Skin: No lesions  Access: Rt chest permcath    Basic Metabolic Panel: Recent Labs  Lab 08/07/23 1435 08/08/23 0540 08/09/23 0621 08/10/23 0846 08/11/23 0428  NA 137 138 138 140 135  K 2.7* 2.7* 5.0 4.6 3.8  CL 97* 104 108 109 102  CO2 27 27 25 24 24   GLUCOSE 312* 137* 100* 125* 114*  BUN 27* 24* 19 13 7   CREATININE 1.78* 1.39* 1.18* 1.07* 0.97  CALCIUM  10.2 9.5 9.1 9.7 8.9  MG  --  1.6*  --   --   --     Liver  Function Tests: Recent Labs  Lab 08/07/23 1435  AST 20  ALT 15  ALKPHOS 76  BILITOT 1.2*  PROT 7.0  ALBUMIN 3.7   Recent Labs  Lab 08/07/23 1435  LIPASE 35    No results for input(s): AMMONIA in the last 168 hours.  CBC: Recent Labs  Lab 08/07/23 1435 08/08/23 0540 08/09/23 0621 08/10/23 0846 08/11/23 0428  WBC 15.7* 11.2* 9.5 8.2 8.0  NEUTROABS  --   --   --  4.5 4.3  HGB 12.3 9.8* 9.7* 10.1* 10.4*  HCT 35.4* 28.6* 28.6* 30.2* 31.1*  MCV 88.3 88.3 88.8 92.6 89.6  PLT 312 231 232 269 268    Cardiac Enzymes: No results for input(s): CKTOTAL, CKMB, CKMBINDEX, TROPONINI in the last 168 hours.  BNP: Invalid input(s): POCBNP  CBG: Recent Labs  Lab 08/10/23 0801 08/10/23 1306 08/10/23 1604 08/10/23 2059 08/11/23 0734  GLUCAP 110* 116* 162* 104* 113*    Microbiology: Results for orders placed or performed during the hospital encounter of 08/07/23  C Difficile Quick Screen w PCR reflex     Status: None   Collection Time: 08/07/23  6:59 PM   Specimen: STOOL  Result Value Ref Range Status   C Diff antigen NEGATIVE NEGATIVE Final   C Diff toxin NEGATIVE NEGATIVE Final   C Diff interpretation No C. difficile detected.  Final    Comment: Performed at [facility], [address]  [place], [place] [address]  Urine Culture (for pregnant, neutropenic or urologic patients or patients with an indwelling urinary catheter)     Status: Abnormal   Collection Time: 08/07/23  8:20 PM   Specimen: Urine, Catheterized  Result Value Ref Range Status   Specimen Description   Final    URINE, CATHETERIZED Performed at [facility], [address] [place], [place] [address]    Special Requests   Final    NONE Performed at [facility], [address] [place], [place] [address]    Culture (A)  Final    >=100,000 COLONIES/mL METHICILLIN RESISTANT STAPHYLOCOCCUS AUREUS   Report Status 08/10/2023 FINAL  Final   Organism ID, Bacteria  METHICILLIN RESISTANT STAPHYLOCOCCUS AUREUS (A)  Final      Susceptibility   Methicillin resistant staphylococcus aureus - MIC*    CIPROFLOXACIN >=8 RESISTANT Resistant     GENTAMICIN <=0.5 SENSITIVE Sensitive     NITROFURANTOIN <=16 SENSITIVE Sensitive     OXACILLIN >=4 RESISTANT Resistant     TETRACYCLINE <=1 SENSITIVE Sensitive     VANCOMYCIN  1 SENSITIVE Sensitive     TRIMETH /SULFA  <=10 SENSITIVE Sensitive     RIFAMPIN <=0.5 SENSITIVE Sensitive     Inducible Clindamycin NEGATIVE Sensitive     LINEZOLID 2 SENSITIVE Sensitive     * >=100,000 COLONIES/mL METHICILLIN RESISTANT STAPHYLOCOCCUS AUREUS  Culture, blood (Routine X 2) w Reflex to ID Panel     Status: None (Preliminary result)   Collection Time: 08/07/23 11:29 PM   Specimen: BLOOD  Result Value Ref Range Status   Specimen Description BLOOD BLOOD RIGHT ARM  Final   Special Requests   Final    BOTTLES DRAWN AEROBIC AND ANAEROBIC Blood Culture results may not be optimal due to an inadequate volume of blood received in culture bottles   Culture   Final    NO GROWTH 3 DAYS Performed at [facility], [address] [place], [place] [address]    Report Status PENDING  Incomplete  Culture, blood (Routine X 2) w Reflex to ID Panel     Status: None (Preliminary result)   Collection Time: 08/07/23 11:29 PM   Specimen: BLOOD  Result Value Ref Range Status   Specimen Description BLOOD BLOOD LEFT ARM  Final   Special Requests   Final    BOTTLES DRAWN AEROBIC AND ANAEROBIC Blood Culture results may not be optimal due to an inadequate volume of blood received in culture bottles   Culture   Final    NO GROWTH 3 DAYS Performed at [facility], [address] [place], [place] [address]    Report Status PENDING  Incomplete  MRSA Next Gen by PCR, Nasal     Status: Abnormal   Collection Time: 08/10/23  6:00 AM   Specimen: Nasal Mucosa; Nasal Swab  Result Value Ref Range Status   MRSA by PCR Next Gen DETECTED (A) NOT  DETECTED Final    Comment: RESULT CALLED TO, READ BACK BY AND VERIFIED WITH: Ave, Katrina 08/10/23 0732 MW (NOTE) The GeneXpert MRSA Assay (FDA approved for NASAL specimens only), is one component of a comprehensive MRSA colonization surveillance program. It is not intended to diagnose MRSA infection nor to guide or monitor treatment for MRSA infections. Test performance is not FDA approved in patients less than 10 years old. Performed at [facility], [address] [place], [place] [address]     Coagulation Studies: No results for input(s): LABPROT, INR in the last  72 hours.   Urinalysis: No results for input(s): COLORURINE, LABSPEC, PHURINE, GLUCOSEU, HGBUR, BILIRUBINUR, KETONESUR, PROTEINUR, UROBILINOGEN, NITRITE, LEUKOCYTESUR in the last 72 hours.  Invalid input(s): APPERANCEUR      Imaging: No results found.    Medications:       (feeding supplement) PROSource Plus  30 mL Oral TID BM   acidophilus  1 capsule Oral Daily   aspirin   81 mg Oral Daily   atorvastatin   80 mg Oral Daily   Chlorhexidine  Gluconate Cloth  6 each Topical Q0600   feeding supplement  1 Container Oral TID BM   heparin  injection (subcutaneous)  5,000 Units Subcutaneous Q8H   insulin  aspart  0-5 Units Subcutaneous QHS   insulin  aspart  0-9 Units Subcutaneous TID WC   metoprolol  succinate  12.5 mg Oral Daily   multivitamin  1 tablet Oral QHS   pantoprazole   40 mg Oral Daily   polyethylene glycol  17 g Oral Daily   psyllium  1 packet Oral Daily   sulfamethoxazole -trimethoprim   1 tablet Oral Q12H   acetaminophen  **OR** acetaminophen , loperamide , ondansetron  **OR** ondansetron  (ZOFRAN ) IV, traZODone   Assessment/ Plan:  Ms. NEESHA LANGTON is a 58 y.o.  female  is a 58 y.o. female with a PMHx of diabetes mellitus type 2, seizure disorder, hypertension, dysrhythmia, and history of ovarian cancer.  Patient presents to the emergency department  complaining of nausea, vomiting, and diarrhea.  Patient has been admitted for Acute gastroenteritis [K52.9] UTI (urinary tract infection) [N39.0] ESRD on hemodialysis (HCC) [N18.6, Z99.2] Cystitis [N30.90] Stercoral colitis [K52.89]  [facility]/T-S/right chest PermCath  Acute Kidney Injury on chronic kidney disease stage IIIa with baseline creatinine 1.27 and GFR of 49 on 04/07/23.  Now receiving hemodialysis, initiated on 12/3. Acute kidney injury resulting in ATN secondary to volume depletion and continued ACE/ARB use. - continue to monitor for renal recovery.   Lab Results  Component Value Date   CREATININE 0.97 08/11/2023   CREATININE 1.07 (H) 08/10/2023   CREATININE 1.18 (H) 08/09/2023    Intake/Output Summary (Last 24 hours) at 08/11/2023 1106 Last data filed at 08/11/2023 0600 Gross per 24 hour  Intake 1230 ml  Output 1001 ml  Net 229 ml    2.  Hypokalemia.  Potassium improved to 3.8 today. No longer on potassium supplementation  3. Anemia of chronic kidney disease Lab Results  Component Value Date   HGB 10.4 (L) 08/11/2023    Hemoglobin at goal  4. Diabetes mellitus type II with chronic kidney disease: noninsulin dependent. Home regimen includes metformin. Most recent hemoglobin A1c is 8.2 on 03/31/23.   Continue glucose control  5. Secondary Hyperparathyroidism: with outpatient labs: PTH 153, phosphorus 3.8, calcium  10.8 on 07/29/23.   Lab Results  Component Value Date   CALCIUM  8.9 08/11/2023   CAION 1.22 04/06/2023   PHOS 2.3 (L) 07/17/2023    Phosphorus and calcium  at goal.    LOS: 3 Emmakate Hypes 1/1/202511:06 AM

## 2023-08-12 DIAGNOSIS — R112 Nausea with vomiting, unspecified: Secondary | ICD-10-CM | POA: Diagnosis not present

## 2023-08-12 DIAGNOSIS — R197 Diarrhea, unspecified: Secondary | ICD-10-CM | POA: Diagnosis not present

## 2023-08-12 LAB — GLUCOSE, CAPILLARY
Glucose-Capillary: 99 mg/dL (ref 70–99)
Glucose-Capillary: 149 mg/dL — ABNORMAL HIGH (ref 70–99)
Glucose-Capillary: 102 mg/dL — ABNORMAL HIGH (ref 70–99)
Glucose-Capillary: 141 mg/dL — ABNORMAL HIGH (ref 70–99)

## 2023-08-12 MED ORDER — BISACODYL 5 MG PO TBEC
5.0000 mg | DELAYED_RELEASE_TABLET | Freq: Once | ORAL | Status: AC
  Administered 2023-08-12: 5 mg via ORAL
  Filled 2023-08-12: qty 1

## 2023-08-12 MED ORDER — NEPRO/CARBSTEADY PO LIQD
237.0000 mL | Freq: Two times a day (BID) | ORAL | Status: DC
  Administered 2023-08-14 – 2023-08-17 (×6): 237 mL via ORAL

## 2023-08-12 NOTE — Progress Notes (Signed)
 [facility]  ROUNDING NOTE   Subjective:   Sarah Lane  is a 58 y.o. female with a PMHx of diabetes mellitus type 2, seizure disorder, hypertension, dysrhythmia, and history of ovarian cancer.  Patient presents to the emergency department with nausea and vomiting. She has been admitted for Acute gastroenteritis [K52.9] UTI (urinary tract infection) [N39.0] ESRD on hemodialysis (HCC) [N18.6, Z99.2] Cystitis [N30.90] Stercoral colitis [K52.89]  Patient resting comfortably No complaints to offer   Objective:  Vital signs in last 24 hours:  Temp:  [98.3 F (36.8 C)-99.8 F (37.7 C)] 98.3 F (36.8 C) (01/02 0734) Pulse Rate:  [70-94] 70 (01/02 0734) Resp:  [17-18] 17 (01/02 0734) BP: (95-135)/(68-77) 135/77 (01/02 0734) SpO2:  [98 %] 98 % (01/02 0734)  Weight change:  Filed Weights   08/07/23 1431 08/10/23 0810 08/10/23 1245  Weight: 58.1 kg 60.3 kg 59.2 kg    Intake/Output: I/O last 3 completed shifts: In: 1260 [P.O.:1260] Out: 1 [Urine:1]   Intake/Output this shift:  No intake/output data recorded.  Physical Exam: General: NAD, laying in bed  Head: Normocephalic, atraumatic.  Dry oral mucosal membranes  Eyes: Anicteric  Lungs:  Clear to auscultation, normal effort  Heart: Regular rate and rhythm  Abdomen:  Soft, nontender, nondistended  Extremities: No peripheral edema.  Neurologic: Alert and oriented, moving all four extremities  Skin: No lesions  Access: Rt chest permcath    Basic Metabolic Panel: Recent Labs  Lab 08/07/23 1435 08/08/23 0540 08/09/23 0621 08/10/23 0846 08/11/23 0428  NA 137 138 138 140 135  K 2.7* 2.7* 5.0 4.6 3.8  CL 97* 104 108 109 102  CO2 27 27 25 24 24   GLUCOSE 312* 137* 100* 125* 114*  BUN 27* 24* 19 13 7   CREATININE 1.78* 1.39* 1.18* 1.07* 0.97  CALCIUM  10.2 9.5 9.1 9.7 8.9  MG  --  1.6*  --   --   --     Liver Function Tests: Recent Labs  Lab 08/07/23 1435  AST 20  ALT 15  ALKPHOS 76  BILITOT 1.2*   PROT 7.0  ALBUMIN 3.7   Recent Labs  Lab 08/07/23 1435  LIPASE 35    No results for input(s): AMMONIA in the last 168 hours.  CBC: Recent Labs  Lab 08/07/23 1435 08/08/23 0540 08/09/23 0621 08/10/23 0846 08/11/23 0428  WBC 15.7* 11.2* 9.5 8.2 8.0  NEUTROABS  --   --   --  4.5 4.3  HGB 12.3 9.8* 9.7* 10.1* 10.4*  HCT 35.4* 28.6* 28.6* 30.2* 31.1*  MCV 88.3 88.3 88.8 92.6 89.6  PLT 312 231 232 269 268    Cardiac Enzymes: No results for input(s): CKTOTAL, CKMB, CKMBINDEX, TROPONINI in the last 168 hours.  BNP: Invalid input(s): POCBNP  CBG: Recent Labs  Lab 08/11/23 1146 08/11/23 1653 08/11/23 2047 08/12/23 0742 08/12/23 1229  GLUCAP 130* 119* 121* 99 149*    Microbiology: Results for orders placed or performed during the hospital encounter of 08/07/23  C Difficile Quick Screen w PCR reflex     Status: None   Collection Time: 08/07/23  6:59 PM   Specimen: STOOL  Result Value Ref Range Status   C Diff antigen NEGATIVE NEGATIVE Final   C Diff toxin NEGATIVE NEGATIVE Final   C Diff interpretation No C. difficile detected.  Final    Comment: Performed at [facility], [address] [place], [place] [address]  Urine Culture (for pregnant, neutropenic or urologic patients or patients  with an indwelling urinary catheter)     Status: Abnormal   Collection Time: 08/07/23  8:20 PM   Specimen: Urine, Catheterized  Result Value Ref Range Status   Specimen Description   Final    URINE, CATHETERIZED Performed at [facility], [address], [place], [place] [address]    Special Requests   Final    NONE Performed at [facility], [address] [place], [place] [address]    Culture (A)  Final    >=100,000 COLONIES/mL METHICILLIN RESISTANT STAPHYLOCOCCUS AUREUS   Report Status 08/10/2023 FINAL  Final   Organism ID, Bacteria METHICILLIN RESISTANT STAPHYLOCOCCUS AUREUS (A)  Final      Susceptibility   Methicillin  resistant staphylococcus aureus - MIC*    CIPROFLOXACIN >=8 RESISTANT Resistant     GENTAMICIN <=0.5 SENSITIVE Sensitive     NITROFURANTOIN <=16 SENSITIVE Sensitive     OXACILLIN >=4 RESISTANT Resistant     TETRACYCLINE <=1 SENSITIVE Sensitive     VANCOMYCIN  1 SENSITIVE Sensitive     TRIMETH /SULFA  <=10 SENSITIVE Sensitive     RIFAMPIN <=0.5 SENSITIVE Sensitive     Inducible Clindamycin NEGATIVE Sensitive     LINEZOLID 2 SENSITIVE Sensitive     * >=100,000 COLONIES/mL METHICILLIN RESISTANT STAPHYLOCOCCUS AUREUS  Culture, blood (Routine X 2) w Reflex to ID Panel     Status: None (Preliminary result)   Collection Time: 08/07/23 11:29 PM   Specimen: BLOOD  Result Value Ref Range Status   Specimen Description BLOOD BLOOD RIGHT ARM  Final   Special Requests   Final    BOTTLES DRAWN AEROBIC AND ANAEROBIC Blood Culture results may not be optimal due to an inadequate volume of blood received in culture bottles   Culture   Final    NO GROWTH 4 DAYS Performed at [facility], [address] [place], [place] [address]    Report Status PENDING  Incomplete  Culture, blood (Routine X 2) w Reflex to ID Panel     Status: None (Preliminary result)   Collection Time: 08/07/23 11:29 PM   Specimen: BLOOD  Result Value Ref Range Status   Specimen Description BLOOD BLOOD LEFT ARM  Final   Special Requests   Final    BOTTLES DRAWN AEROBIC AND ANAEROBIC Blood Culture results may not be optimal due to an inadequate volume of blood received in culture bottles   Culture   Final    NO GROWTH 4 DAYS Performed at [facility], [address] [place], [place] [address]    Report Status PENDING  Incomplete  MRSA Next Gen by PCR, Nasal     Status: Abnormal   Collection Time: 08/10/23  6:00 AM   Specimen: Nasal Mucosa; Nasal Swab  Result Value Ref Range Status   MRSA by PCR Next Gen DETECTED (A) NOT DETECTED Final    Comment: RESULT CALLED TO, READ BACK BY AND VERIFIED WITH: Ave,  Katrina 08/10/23 0732 MW (NOTE) The GeneXpert MRSA Assay (FDA approved for NASAL specimens only), is one component of a comprehensive MRSA colonization surveillance program. It is not intended to diagnose MRSA infection nor to guide or monitor treatment for MRSA infections. Test performance is not FDA approved in patients less than 85 years old. Performed at [facility], [address] [place], [place] [address]     Coagulation Studies: No results for input(s): LABPROT, INR in the last 72 hours.   Urinalysis: No results for input(s): COLORURINE, LABSPEC, PHURINE, GLUCOSEU, HGBUR,  BILIRUBINUR, KETONESUR, PROTEINUR, UROBILINOGEN, NITRITE, LEUKOCYTESUR in the last 72 hours.  Invalid input(s): APPERANCEUR      Imaging: No results found.    Medications:       (feeding supplement) PROSource Plus  30 mL Oral TID BM   acidophilus  1 capsule Oral Daily   aspirin   81 mg Oral Daily   atorvastatin   80 mg Oral Daily   Chlorhexidine  Gluconate Cloth  6 each Topical Q0600   feeding supplement  1 Container Oral TID BM   heparin  injection (subcutaneous)  5,000 Units Subcutaneous Q8H   insulin  aspart  0-5 Units Subcutaneous QHS   insulin  aspart  0-9 Units Subcutaneous TID WC   metoprolol  succinate  12.5 mg Oral Daily   multivitamin  1 tablet Oral QHS   pantoprazole   40 mg Oral Daily   polyethylene glycol  17 g Oral Daily   psyllium  1 packet Oral Daily   sulfamethoxazole -trimethoprim   1 tablet Oral Q12H   acetaminophen  **OR** acetaminophen , ondansetron  **OR** [DISCONTINUED] ondansetron  (ZOFRAN ) IV, traZODone   Assessment/ Plan:  [name] is a 58 y.o.  female  is a 58 y.o. female with a PMHx of diabetes mellitus type 2, seizure disorder, hypertension, dysrhythmia, and history of ovarian cancer.  Patient presents to the emergency department complaining of nausea, vomiting, and diarrhea.  Patient has been admitted for Acute  gastroenteritis [K52.9] UTI (urinary tract infection) [N39.0] ESRD on hemodialysis (HCC) [N18.6, Z99.2] Cystitis [N30.90] Stercoral colitis [K52.89]  [facility]/T-S/right chest PermCath  Acute Kidney Injury on chronic kidney disease stage IIIa with baseline creatinine 1.27 and GFR of 49 on 04/07/23.  Now receiving hemodialysis, initiated on 12/3. Acute kidney injury resulting in ATN secondary to volume depletion and continued ACE/ARB use. - Due to incontinence, order placed to insert foley catheter for 24 hour urine collection.  - Will order 24 hour creatinine clearance to assess renal recovery.  - Will continue to hold dialysis during collection.   Lab Results  Component Value Date   CREATININE 0.97 08/11/2023   CREATININE 1.07 (H) 08/10/2023   CREATININE 1.18 (H) 08/09/2023    Intake/Output Summary (Last 24 hours) at 08/12/2023 1342 Last data filed at 08/11/2023 1421 Gross per 24 hour  Intake 540 ml  Output --  Net 540 ml    2.  Hypokalemia.  No labs collected today  3. Anemia of chronic kidney disease Lab Results  Component Value Date   HGB 10.4 (L) 08/11/2023    Will continue to monitor  4. Diabetes mellitus type II with chronic kidney disease: noninsulin dependent. Home regimen includes metformin. Most recent hemoglobin A1c is 8.2 on 03/31/23.   Glucose well controlled  5. Secondary Hyperparathyroidism: with outpatient labs: PTH 153, phosphorus 3.8, calcium  10.8 on 07/29/23.   Lab Results  Component Value Date   CALCIUM  8.9 08/11/2023   CAION 1.22 04/06/2023   PHOS 2.3 (L) 07/17/2023    Phosphorus and calcium  at goal.    LOS: 4 Georgi Navarrete 1/2/20251:42 PM

## 2023-08-12 NOTE — Progress Notes (Signed)
 Occupational Therapy Treatment Patient Details Name: Sarah Lane MRN: [id number] DOB: 10-24-1965 Today's Date: 08/12/2023   History of present illness Pt is a 58 y/o female admitted secondary to nausea, vomiting and diahrrea. Pt found to have a UTI and sepsis. 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   OT comments  Chart reviewed to date, pt greeted in bed, agreeable to OT tx targeting improving functional activity tolerance and ADL performance. Pt reports feeling anxious about current social/housing situation. Pt continues to present with deficits in strength, activity tolerance, balance. Pt reported dizziness when standing at sink level with one posterior LOB requiring MIN A to re-correct.Also, Pt noted to cough after drinking water sitting at edge of bed, reports this happens when she's drinking too fast; nurse notified. Pt is making slow progress towards goals, will continue to benefit from skilled OT to address deficits. Pt is left in bed, all needs met. OT will follow acutely.       If plan is discharge home, recommend the following:  A lot of help with walking and/or transfers;A little help with bathing/dressing/bathroom;Assistance with cooking/housework;Help with stairs or ramp for entrance;Assist for transportation   Equipment Recommendations  BSC/3in1;Tub/shower seat    Recommendations for Other Services      Precautions / Restrictions Precautions Precautions: Fall Restrictions Weight Bearing Restrictions Per Provider Order: No       Mobility Bed Mobility Overal bed mobility: Needs Assistance Bed Mobility: Supine to Sit, Sit to Supine     Supine to sit: Supervision, HOB elevated Sit to supine: Supervision, HOB elevated        Transfers Overall transfer level: Needs assistance Equipment used: Rolling walker (2 wheels) Transfers: Sit to/from  Stand Sit to Stand: Contact guard assist, Min assist                 Balance Overall balance assessment: Needs assistance Sitting-balance support: No upper extremity supported, Feet supported Sitting balance-Leahy Scale: Good     Standing balance support: Bilateral upper extremity supported, Reliant on assistive device for balance Standing balance-Leahy Scale: Poor                             ADL either performed or assessed with clinical judgement   ADL Overall ADL's : Needs assistance/impaired     Grooming: Supervision/safety;Sitting;Standing Grooming Details (indicate cue type and reason): with RW at sink level, pt with dizziness in standing so completed tasks in sitting         Upper Body Dressing : Supervision/safety;Sitting   Lower Body Dressing: Supervision/safety;Sitting/lateral leans   Toilet Transfer: Contact guard assist;Minimal assistance;Rolling walker (2 wheels) Toilet Transfer Details (indicate cue type and reason): simulated, short amb transfers in room, frequent vcs for technique           General ADL Comments: Pt c/o dizziness in standing with poserior LOB, requiring MIN A to correct    Extremity/Trunk Assessment              Vision Patient Visual Report: No change from baseline     Perception     Praxis      Cognition Arousal: Alert Behavior During Therapy: WFL for tasks assessed/performed Overall Cognitive Status: No family/caregiver present to determine baseline cognitive functioning Area of Impairment: Problem solving, Safety/judgement  Safety/Judgement: Decreased awareness of deficits   Problem Solving: Slow processing, Requires verbal cues          Exercises Other Exercises Other Exercises: edu re: role of OT, role of rehab, discharge recommendations, safe ADL completion    Shoulder Instructions       General Comments      Pertinent Vitals/ Pain       Pain  Assessment Pain Assessment: No/denies pain  Home Living                                          Prior Functioning/Environment              Frequency  Min 1X/week        Progress Toward Goals  OT Goals(current goals can now be found in the care plan section)  Progress towards OT goals: Progressing toward goals  Acute Rehab OT Goals Time For Goal Achievement: 08/23/23  Plan      Co-evaluation                 AM-PAC OT 6 Clicks Daily Activity     Outcome Measure   Help from another person eating meals?: None Help from another person taking care of personal grooming?: None Help from another person toileting, which includes using toliet, bedpan, or urinal?: A Little Help from another person bathing (including washing, rinsing, drying)?: A Little Help from another person to put on and taking off regular upper body clothing?: None Help from another person to put on and taking off regular lower body clothing?: A Little 6 Click Score: 21    End of Session Equipment Utilized During Treatment: Rolling walker (2 wheels)  OT Visit Diagnosis: Other abnormalities of gait and mobility (R26.89);Muscle weakness (generalized) (M62.81)   Activity Tolerance Patient tolerated treatment well   Patient Left in bed;with call bell/phone within reach;with bed alarm set   Nurse Communication Mobility status        Time: 8482-8464 OT Time Calculation (min): 18 min  Charges: OT General Charges $OT Visit: 1 Visit OT Treatments $Self Care/Home Management : 8-22 mins  Therisa Sheffield, OTD OTR/L  08/12/23, 4:06 PM

## 2023-08-12 NOTE — Progress Notes (Signed)
 Nutrition Follow-up  DOCUMENTATION CODES:   Non-severe (moderate) malnutrition in context of social or environmental circumstances  INTERVENTION:   -D/c Prosource Plus -D/c Boost Breeze -Continue renal MVI daily -Nepro Shake po BID, each supplement provides 425 kcal and 19 grams protein   NUTRITION DIAGNOSIS:   Moderate Malnutrition related to social / environmental circumstances as evidenced by percent weight loss, mild fat depletion, moderate fat depletion, mild muscle depletion, moderate muscle depletion.  Ongoing  GOAL:   Patient will meet greater than or equal to 90% of their needs  Progressing   MONITOR:   PO intake, Supplement acceptance  REASON FOR ASSESSMENT:   Consult Assessment of nutrition requirement/status  ASSESSMENT:   Pt with medical history significant for asthma, type diabetes mellitus, hypertension, PSVT, seizure disorder and ovarian cancer status post TAH and BSO, who presented to the ER with acute onset of recurrent nausea, vomiting with diarrhea over the last week.  Reviewed I/O's: +540 ml x 24 hours and +766 ml since admission   Spoke with pt at bedside, who was pleasant and in good spirits today. Pt reports she suffered a stroke in the past and has word finding difficulties and can be slow to respond to questions. She reports that she is feeling better and is happy to be able to tolerate solid foods. Pt reports she consumed some breakfast today as well as 100% of Nepro shake.   Per pt, she has had decreased oral intake over the past week secondary to nausea, vomiting, and diarrhea. Per pt, diarrhea started on 09/05/22 and has been unable to keep down solid foods since.   Pt shares that he has been compliant with HD treatments and was tolerating them well PTA. Per TOC notes, pt may not need HD at d/c.   Discussed importance of good meal and supplement intake to promote healing. Pt amenable to Nepro supplements.   Per TOC notes, plan for SNF  placement.   Reviewed wt hx; pt has experienced a 14.6% wt loss over the past 3 months, which is significant for time frame.   Medications reviewed and include dulcolax, protonix , miralax , and metamucil.   Labs reviewed: CBGS: 99-162 (inpatient orders for glycemic control are none).    NUTRITION - FOCUSED PHYSICAL EXAM:  Flowsheet Row Most Recent Value  Orbital Region Moderate depletion  Upper Arm Region Moderate depletion  Thoracic and Lumbar Region No depletion  Buccal Region Mild depletion  Temple Region Moderate depletion  Clavicle Bone Region Moderate depletion  Clavicle and Acromion Bone Region Moderate depletion  Scapular Bone Region Moderate depletion  Dorsal Hand Moderate depletion  Patellar Region Moderate depletion  Anterior Thigh Region Moderate depletion  Posterior Calf Region Moderate depletion  Edema (RD Assessment) Mild  Hair Reviewed  Eyes Reviewed  Mouth Reviewed  Skin Reviewed  Nails Reviewed       Diet Order:   Diet Order             Diet renal with fluid restriction Fluid restriction: 1200 mL Fluid; Room service appropriate? Yes; Fluid consistency: Thin  Diet effective now                   EDUCATION NEEDS:   Education needs have been addressed  Skin:  Skin Assessment: Reviewed RN Assessment  Last BM:  08/12/23 (type 6)  Height:   Ht Readings from Last 1 Encounters:  08/07/23 5' (1.524 m)    Weight:   Wt Readings from Last 1 Encounters:  08/10/23  59.2 kg    Ideal Body Weight:  45.5 kg  BMI:  Body mass index is 25.49 kg/m.  Estimated Nutritional Needs:   Kcal:  1650-1850  Protein:  80-95 grams  Fluid:  1000 ml + UOP    Margery ORN, RD, LDN, CDCES Registered Dietitian III Certified Diabetes Care and Education Specialist If unable to reach this RD, please use RD Inpatient group chat on secure chat between hours of 8am-4 pm daily

## 2023-08-12 NOTE — Progress Notes (Signed)
 Physical Therapy Treatment Patient Details Name: Sarah Lane MRN: [id number] DOB: [birth year] Today's Date: 08/12/2023   History of Present Illness Pt is a 58 y/o female admitted secondary to nausea, vomiting and diahrrea. Pt found to have a UTI and sepsis. 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    PT Comments  Pt resting in bed upon PT arrival; agreeable to therapy; no c/o pain throughout session.  During session pt SBA with bed mobility; min assist with transfers (x6 trials from bed; assist to steady in standing; use of RW); and CGA to side step to L along bed a few feet with RW use.  Limited activity d/t pt concerned about causing herself to have BM with activity (pt reports recent h/o multiple BM's; NT recently assisted pt with clean-up in bed d/t BM).  Will continue to focus on strengthening, balance, and progressive functional mobility during hospitalization.   If plan is discharge home, recommend the following: Assistance with cooking/housework;Help with stairs or ramp for entrance;Assist for transportation;A little help with walking and/or transfers;A little help with bathing/dressing/bathroom   Can travel by private vehicle     No  Equipment Recommendations  Rolling walker (2 wheels);BSC/3in1;Wheelchair (measurements PT);Wheelchair cushion (measurements PT)    Recommendations for Other Services       Precautions / Restrictions Precautions Precautions: Fall Restrictions Weight Bearing Restrictions Per Provider Order: No     Mobility  Bed Mobility Overal bed mobility: Needs Assistance Bed Mobility: Supine to Sit, Sit to Supine     Supine to sit: Supervision, HOB elevated Sit to supine: Supervision, HOB elevated   General bed mobility comments: increased effort/time to perform on own; use of bed rail    Transfers Overall transfer level: Needs  assistance Equipment used: Rolling walker (2 wheels) Transfers: Sit to/from Stand Sit to Stand: Min assist           General transfer comment: x6 trials standing from bed (assist to steady upon standing); vc's for UE placement    Ambulation/Gait Ambulation/Gait assistance: Contact guard assist Gait Distance (Feet):  (sidestepped to L along bed a few feet) Assistive device: Rolling walker (2 wheels)         General Gait Details: limited d/t pt concerned about having BM with too much activity   Stairs             Wheelchair Mobility     Tilt Bed    Modified Rankin (Stroke Patients Only)       Balance Overall balance assessment: Needs assistance Sitting-balance support: No upper extremity supported, Feet supported Sitting balance-Leahy Scale: Good Sitting balance - Comments: steady reaching within BOS   Standing balance support: Bilateral upper extremity supported, Reliant on assistive device for balance Standing balance-Leahy Scale: Poor Standing balance comment: initial assist to steady once standing                            Cognition Arousal: Alert Behavior During Therapy: WFL for tasks assessed/performed Overall Cognitive Status: Within Functional Limits for tasks assessed                                 General Comments: A&O x4        Exercises General Exercises - Lower Extremity Long  Arc Quad: AROM, Strengthening, Both, 10 reps, Seated Hip Flexion/Marching: AROM, Strengthening, Both, 10 reps, Seated    General Comments  Nursing cleared pt for participation in physical therapy.  Pt agreeable to PT session.      Pertinent Vitals/Pain Pain Assessment Pain Assessment: No/denies pain Pain Intervention(s): Limited activity within patient's tolerance, Monitored during session Vitals (HR and SpO2 on room air) stable and WNL throughout treatment session.    Home Living                          Prior  Function            PT Goals (current goals can now be found in the care plan section) Acute Rehab PT Goals Patient Stated Goal: to feel better PT Goal Formulation: With patient Time For Goal Achievement: 08/22/23 Potential to Achieve Goals: Good Progress towards PT goals: Progressing toward goals    Frequency    Min 1X/week      PT Plan      Co-evaluation              AM-PAC PT 6 Clicks Mobility   Outcome Measure  Help needed turning from your back to your side while in a flat bed without using bedrails?: A Little Help needed moving from lying on your back to sitting on the side of a flat bed without using bedrails?: A Little Help needed moving to and from a bed to a chair (including a wheelchair)?: A Little Help needed standing up from a chair using your arms (e.g., wheelchair or bedside chair)?: A Little Help needed to walk in hospital room?: A Lot Help needed climbing 3-5 steps with a railing? : A Lot 6 Click Score: 16    End of Session   Activity Tolerance: Patient tolerated treatment well Patient left: in bed;with call bell/phone within reach;with bed alarm set Nurse Communication: Mobility status;Precautions PT Visit Diagnosis: Other abnormalities of gait and mobility (R26.89)     Time: 1030-1043 PT Time Calculation (min) (ACUTE ONLY): 13 min  Charges:    $Therapeutic Activity: 8-22 mins PT General Charges $$ ACUTE PT VISIT: 1 Visit                     Damien Caulk, PT 08/12/23, 12:15 PM

## 2023-08-12 NOTE — TOC Progression Note (Signed)
 Transition of Care [facility]) - Progression Note    Patient Details  Name: Sarah Lane MRN: [id number] Date of Birth: Dec 15, 1965  Transition [facility]) CM/SW Contact  [name], RN Phone Number: 08/12/2023, 12:57 PM  Clinical Narrative:     No bed offers at this time, expanded the bed search, she may be able to recover and not need Dialysis at DC  Expected Discharge Plan: Skilled Nursing Facility Barriers to Discharge: SNF Pending bed offer  Expected Discharge Plan and Services   Discharge Planning Services: CM Consult   Living arrangements for the past 2 months: Mobile Home                   DME Agency: NA                   Social Determinants of Health (SDOH) Interventions SDOH Screenings   Food Insecurity: Food Insecurity Present (08/09/2023)  Housing: Low Risk  (08/09/2023)  Recent Concern: Housing - Medium Risk (07/10/2023)  Transportation Needs: Unmet Transportation Needs (08/09/2023)  Utilities: Not At Risk (08/09/2023)  Financial Resource Strain: Medium Risk (05/17/2023)   Received from [facility]  Physical Activity: Insufficiently Active (05/17/2023)   Received from [facility]  Social Connections: Socially Isolated (05/18/2023)   Received from [facility]  Stress: No Stress Concern Present (05/18/2023)   Received from [facility]  Tobacco Use: Unknown (08/07/2023)  Health Literacy: Medium Risk (05/17/2023)   [facility] from [facility]    Readmission Risk Interventions    07/11/2023   11:51 AM  Readmission Risk Prevention Plan  Transportation Screening Complete  PCP or Specialist Appt within 5-7 Days Complete  Home Care Screening Complete  Medication Review (RN CM) Complete

## 2023-08-12 NOTE — Progress Notes (Signed)
 Progress Note   Patient: Sarah Lane FMW:[id number] DOB: 23-Sep-1965 DOA: 08/07/2023     4 DOS: the patient was seen and examined on 08/12/2023   Brief hospital course: 58yo with h/o asthma, DM, HTN, PSVT, seizure d/o, ESRD recently started on HD, and ovarian CA s/p TAH/BSO who presented on 12/28 with n/v/d.  UA with >100k colonies Staph aureus.   Assessment and Plan:  N/V/D Patient presented with 1 week of n/v/d Imaging with dilated rectum and concern for stercoral colitis She was started on Ceftriaxone  and metronidazole  -> Bactrim  C diff negative Will order bowel care/disimpaction Needs a good bowel regimen She is continuing to have diarrhea which is thought to be overflow Continue Miralax  and metamucil, add dulcolax today   UTI Sepsis ruled out UA with >100k colonies Staph aureus, MRSA Started on ceftriaxone  but will change to Bactrim  per pharmacy Blood cultures NTD   Recent onset ESRD Stage 3b CKD on admission in 03/2023, bordering on stage 4 Cr 5.85 on admission on 11/29 with anuria not responsive to IVF Vascular surgery consulted for HD access during that admission, s/p Right IJ tunneled dialysis catheter  Initiated inpatient -> outpatient HD  She reports that she is now requiring only twice weekly HD Continue Renavite Per nephrology, she may be making sufficient urine for renal recovery; placing foley to monitor UOP and creatinine clearance x 24 hours Holding HD in the interim   DM Recent A1c was 5.3 She is not requiring medications for this issue currently   Dyslipidemia Continue Lipitor    Essential hypertension Continue Toprol -XL   GERD without esophagitis On PPI   Chronic HFrEF (heart failure with reduced ejection fraction)  Echo on 8/22 with EF 30-35%, grade 1 diastolic dysfunction Appears compensated Volume control with HD   CAD R/LHC on 04/06/23 with severe single-vessel CAD with occlusion to vessel too small and distal for PCI Medication  management   Nutrition Status: Nutrition Problem: Increased nutrient needs Etiology: acute illness Signs/Symptoms: estimated needs Interventions: Boost Breeze, MVI, Prostat    Other Appears likely to need additional housing, as her roommate situation is not working out per patient report PT/OT evaluated and are recommending SNF rehab She will also need assistance with housing resources post-rehab       Consultants: PT OT Nutrition   Procedures: None   Antibiotics: Ceftriaxone  12/29-31 Bactrim  12/31-1/6     30 Day Unplanned Readmission Risk Score    Flowsheet Row ED to Hosp-Admission (Current) from 08/07/2023 in [facility] (1A)  30 Day Unplanned Readmission Risk Score (%) 21.45 Filed at 08/12/2023 0400       This score is the patient's risk of an unplanned readmission within 30 days of being discharged (0 -100%). The score is based on dignosis, age, lab data, medications, orders, and past utilization.   Low:  0-14.9   Medium: 15-21.9   High: 22-29.9   Extreme: 30 and above           Subjective: Feeling ok.  Wants to go to SNF and she is willing to give up her monthly check to make it happen.   Objective: Vitals:   08/11/23 2051 08/12/23 0734  BP: 103/68 135/77  Pulse: 76 70  Resp: 17 17  Temp: 98.3 F (36.8 C) 98.3 F (36.8 C)  SpO2: 98% 98%    Intake/Output Summary (Last 24 hours) at 08/12/2023 1459 Last data filed at 08/12/2023 1200 Gross per 24 hour  Intake 237 ml  Output --  Net 237 ml   Filed Weights   08/07/23 1431 08/10/23 0810 08/10/23 1245  Weight: 58.1 kg 60.3 kg 59.2 kg    Exam:  General:  Appears calm and comfortable and is in NAD, unhappy about nursing staff today Eyes:  EOMI, normal lids, iris ENT:  grossly normal hearing, lips & tongue, mmm Neck:  no LAD, masses or thyromegaly Cardiovascular:  RRR. No LE edema.  Respiratory:   CTA bilaterally with no wheezes/rales/rhonchi.  Normal respiratory  effort. Abdomen:  soft, NT, ND Skin:  no rash or induration seen on limited exam Musculoskeletal:  grossly normal tone BUE/BLE, good ROM, no bony abnormality Psychiatric:  blunted mood and affect, speech appropriate but with some hesitation, AOx3 Neurologic:  CN 2-12 grossly intact, moves all extremities in coordinated fashion  Data Reviewed: I have reviewed the patient's lab results since admission.  Pertinent labs for today include:     None today   Family Communication: None present  Disposition: Status is: Inpatient Remains inpatient appropriate because: ongoing evaluation and management     Time spent: 35 minutes  Unresulted Labs (From admission, onward)     Start     Ordered   08/13/23 0500  Basic metabolic panel  Tomorrow morning,   R        08/12/23 1456   08/13/23 0500  CBC with Differential/Platelet  Tomorrow morning,   R        08/12/23 1456   08/12/23 1251  Creatinine clearance, urine, 24 hour  (Creatinine Clearance, urine, 24-hour panel)  Once,   R        08/12/23 1250             Author: Delon Herald, MD 08/12/2023 2:59 PM  For on call review [email or web address].

## 2023-08-13 DIAGNOSIS — E440 Moderate protein-calorie malnutrition: Secondary | ICD-10-CM | POA: Insufficient documentation

## 2023-08-13 DIAGNOSIS — N179 Acute kidney failure, unspecified: Secondary | ICD-10-CM

## 2023-08-13 DIAGNOSIS — B9562 Methicillin resistant Staphylococcus aureus infection as the cause of diseases classified elsewhere: Secondary | ICD-10-CM

## 2023-08-13 DIAGNOSIS — R197 Diarrhea, unspecified: Secondary | ICD-10-CM | POA: Diagnosis not present

## 2023-08-13 DIAGNOSIS — R112 Nausea with vomiting, unspecified: Secondary | ICD-10-CM | POA: Diagnosis not present

## 2023-08-13 DIAGNOSIS — Z992 Dependence on renal dialysis: Secondary | ICD-10-CM

## 2023-08-13 LAB — CBC WITH DIFFERENTIAL/PLATELET
Abs Immature Granulocytes: 0.38 10*3/uL — ABNORMAL HIGH (ref 0.00–0.07)
Basophils Absolute: 0.1 10*3/uL (ref 0.0–0.1)
Basophils Relative: 1 %
Eosinophils Absolute: 0.4 10*3/uL (ref 0.0–0.5)
Eosinophils Relative: 4 %
HCT: 28.1 % — ABNORMAL LOW (ref 36.0–46.0)
Hemoglobin: 9.3 g/dL — ABNORMAL LOW (ref 12.0–15.0)
Immature Granulocytes: 4 %
Lymphocytes Relative: 32 %
Lymphs Abs: 3 10*3/uL (ref 0.7–4.0)
MCH: 30 pg (ref 26.0–34.0)
MCHC: 33.1 g/dL (ref 30.0–36.0)
MCV: 90.6 fL (ref 80.0–100.0)
Monocytes Absolute: 0.8 10*3/uL (ref 0.1–1.0)
Monocytes Relative: 9 %
Neutro Abs: 4.7 10*3/uL (ref 1.7–7.7)
Neutrophils Relative %: 50 %
Platelets: 249 10*3/uL (ref 150–400)
RBC: 3.1 MIL/uL — ABNORMAL LOW (ref 3.87–5.11)
RDW: 14.1 % (ref 11.5–15.5)
WBC: 9.3 10*3/uL (ref 4.0–10.5)
nRBC: 0 % (ref 0.0–0.2)

## 2023-08-13 LAB — BASIC METABOLIC PANEL
Anion gap: 6 (ref 5–15)
BUN: 12 mg/dL (ref 6–20)
CO2: 27 mmol/L (ref 22–32)
Calcium: 9.5 mg/dL (ref 8.9–10.3)
Chloride: 103 mmol/L (ref 98–111)
Creatinine, Ser: 1.44 mg/dL — ABNORMAL HIGH (ref 0.44–1.00)
GFR, Estimated: 42 mL/min — ABNORMAL LOW (ref >60)
Glucose, Bld: 104 mg/dL — ABNORMAL HIGH (ref 70–99)
Potassium: 4.1 mmol/L (ref 3.5–5.1)
Sodium: 136 mmol/L (ref 135–145)

## 2023-08-13 LAB — CULTURE, BLOOD (ROUTINE X 2)
Culture: NO GROWTH
Culture: NO GROWTH

## 2023-08-13 LAB — CREATININE CLEARANCE, URINE, 24 HOUR
Collection Interval-CRCL: 24 h
Creatinine Clearance: 27 mL/min — ABNORMAL LOW (ref 75–115)
Creatinine, 24H Ur: 558 mg/d — ABNORMAL LOW (ref 600–1800)
Creatinine, Urine: 186 mg/dL
Urine Total Volume-CRCL: 300 mL

## 2023-08-13 LAB — GLUCOSE, CAPILLARY
Glucose-Capillary: 122 mg/dL — ABNORMAL HIGH (ref 70–99)
Glucose-Capillary: 154 mg/dL — ABNORMAL HIGH (ref 70–99)
Glucose-Capillary: 117 mg/dL — ABNORMAL HIGH (ref 70–99)
Glucose-Capillary: 114 mg/dL — ABNORMAL HIGH (ref 70–99)

## 2023-08-13 MED ORDER — CHLORHEXIDINE GLUCONATE CLOTH 2 % EX PADS
6.0000 | MEDICATED_PAD | Freq: Every day | CUTANEOUS | Status: DC
  Administered 2023-08-13 – 2023-08-16 (×4): 6 via TOPICAL

## 2023-08-13 MED ORDER — MUPIROCIN 2 % EX OINT
1.0000 | TOPICAL_OINTMENT | Freq: Two times a day (BID) | CUTANEOUS | Status: DC
  Administered 2023-08-13 – 2023-08-17 (×9): 1 via NASAL
  Filled 2023-08-13: qty 22

## 2023-08-13 NOTE — Plan of Care (Signed)

## 2023-08-13 NOTE — Progress Notes (Signed)
 Progress Note   Patient: Sarah Lane FMW:[id number] DOB: 02-22-1966 DOA: 08/07/2023     5 DOS: the patient was seen and examined on 08/13/2023   Brief hospital course: 58yo with h/o asthma, DM, HTN, PSVT, seizure d/o, ESRD recently started on HD, and ovarian CA s/p TAH/BSO who presented on 12/28 with n/v/d.  UA with >100k colonies Staph aureus.   Assessment and Plan:  N/V/D Patient presented with 1 week of n/v/d Imaging with dilated rectum and concern for stercoral colitis She was started on Ceftriaxone  and metronidazole  -> Bactrim  C diff negative Will order bowel care/disimpaction Needs a good bowel regimen She is continuing to have diarrhea which is thought to be overflow Continue Miralax , metamucil, dulcolax  Per nursing, stool appears to be firming up and so the impaction may be improving   UTI Sepsis ruled out UA with >100k colonies Staph aureus, MRSA Started on ceftriaxone  but will change to Bactrim  per pharmacy Blood cultures NTD Dr. Fayette will review to determine length of treatment and whether additional evaluation is needed   Recent onset ESRD Stage 3b CKD on admission in 03/2023, bordering on stage 4 Cr 5.85 on admission on 11/29 with anuria not responsive to IVF Vascular surgery consulted for HD access during that admission, s/p Right IJ tunneled dialysis catheter  Initiated inpatient -> outpatient HD  She reports that she is now requiring only twice weekly HD Continue Renavite Per nephrology, she may be making sufficient urine for renal recovery; placing foley to monitor UOP and creatinine clearance x 24 hours Holding HD in the interim She has only made about 300 cc in 24 hours and creatinine is slowly uptrending so it is likely she will need ongoing HD but will follow for now   DM Recent A1c was 5.3 She is not requiring medications for this issue currently   Dyslipidemia Continue Lipitor    Essential hypertension Continue Toprol -XL   GERD without  esophagitis On PPI   Chronic HFrEF (heart failure with reduced ejection fraction)  Echo on 8/22 with EF 30-35%, grade 1 diastolic dysfunction Appears compensated Volume control with HD   CAD R/LHC on 04/06/23 with severe single-vessel CAD with occlusion to vessel too small and distal for PCI Medication management   Nutrition Status: Nutrition Problem: Moderate Malnutrition Etiology: social / environmental circumstances Signs/Symptoms: percent weight loss, mild fat depletion, moderate fat depletion, mild muscle depletion, moderate muscle depletion Interventions: MVI, Nepro shake    Other Appears likely to need additional housing, as her roommate situation is not working out per patient report PT/OT evaluated and are recommending SNF rehab She will also need assistance with housing resources post-rehab She is willing to give up her monthly check in order to make a facility happen and is awaiting placement at this time       Consultants: ID PT OT Nutrition TOC team   Procedures: None   Antibiotics: Ceftriaxone  12/29-31 Bactrim  12/31-1/6   30 Day Unplanned Readmission Risk Score    Flowsheet Row ED to Hosp-Admission (Current) from 08/07/2023 in [facility] (1A)  30 Day Unplanned Readmission Risk Score (%) 21.54 Filed at 08/13/2023 0401       This score is the patient's risk of an unplanned readmission within 30 days of being discharged (0 -100%). The score is based on dignosis, age, lab data, medications, orders, and past utilization.   Low:  0-14.9   Medium: 15-21.9   High: 22-29.9   Extreme: 30 and above  Subjective: Feeling fine, no complaints physically (she is still upset emotionally about her roommate situation)   Objective: Vitals:   08/12/23 2222 08/13/23 0817  BP: 104/60 112/67  Pulse: 77 77  Resp: 20 16  Temp: 98.4 F (36.9 C)   SpO2: 98% 96%    Intake/Output Summary (Last 24 hours) at 08/13/2023  1249 Last data filed at 08/13/2023 0912 Gross per 24 hour  Intake 438 ml  Output 300 ml  Net 138 ml   Filed Weights   08/07/23 1431 08/10/23 0810 08/10/23 1245  Weight: 58.1 kg 60.3 kg 59.2 kg    Exam:  General:  Appears calm and comfortable and is in NAD Eyes:  EOMI, normal lids, iris ENT:  grossly normal hearing, lips & tongue, mmm Neck:  no LAD, masses or thyromegaly Cardiovascular:  RRR. No LE edema.  Respiratory:   CTA bilaterally with no wheezes/rales/rhonchi.  Normal respiratory effort. Abdomen:  soft, NT, ND Skin:  no rash or induration seen on limited exam Musculoskeletal:  grossly normal tone BUE/BLE, good ROM, no bony abnormality Psychiatric:  blunted mood and affect, speech appropriate but with some hesitation, AOx3 Neurologic:  CN 2-12 grossly intact, moves all extremities in coordinated fashion  Data Reviewed: I have reviewed the patient's lab results since admission.  Pertinent labs for today include:   BUN 12/Creatinine 1.44/GFR 42 WBC 9.3 Hgb 9.3     Family Communication: None present  Disposition: Status is: Inpatient Remains inpatient appropriate because: awaiting placement     Time spent: 35 minutes  Unresulted Labs (From admission, onward)     Start     Ordered   08/14/23 0500  CBC with Differential/Platelet  Tomorrow morning,   R        08/13/23 1249   08/14/23 0500  Basic metabolic panel  Tomorrow morning,   R        08/13/23 1249   08/12/23 1251  Creatinine clearance, urine, 24 hour  (Creatinine Clearance, urine, 24-hour panel)  Once,   R        08/12/23 1250             Author: Delon Herald, MD 08/13/2023 12:49 PM  For on call review [email or web address].

## 2023-08-13 NOTE — TOC Progression Note (Signed)
 Transition of Care [facility]) - Progression Note    Patient Details  Name: Sarah Lane MRN: [id number] Date of Birth: 05/30/1966  Transition [facility] Care [facility]) CM/SW Contact  [name], RN Phone Number: 08/13/2023, 12:29 PM  Clinical Narrative:    Florence Brazil to confirm that they made the ned opffer for STR with medicaid as a payer and left a secure VM for admission asking to confirm the bed offer Called the [facility] in [place] and asked for admissions to confirm the bed offer left a Secure VM for admissions, Awaiting a call back   Expected Discharge Plan: Skilled Nursing Facility Barriers to Discharge: SNF Pending bed offer  Expected Discharge Plan and Services   Discharge Planning Services: CM Consult   Living arrangements for the past 2 months: Mobile Home                   DME Agency: NA                   Social Determinants of Health (SDOH) Interventions SDOH Screenings   Food Insecurity: Food Insecurity Present (08/09/2023)  Housing: Low Risk  (08/09/2023)  Recent Concern: Housing - Medium Risk (07/10/2023)  Transportation Needs: Unmet Transportation Needs (08/09/2023)  Utilities: Not At Risk (08/09/2023)  Financial Resource Strain: Medium Risk (05/17/2023)   Received from [facility]  Physical Activity: Insufficiently Active (05/17/2023)   Received [facility]  Social Connections: Socially Isolated (05/18/2023)   [facility]  Stress: No Stress Concern Present (05/18/2023)   Received from [facility]  Tobacco Use: Unknown (08/07/2023)  Health Literacy: Medium Risk (05/17/2023)   Received from [facility]    Readmission Risk Interventions    07/11/2023   11:51 AM  Readmission Risk Prevention Plan  Transportation Screening Complete  PCP or Specialist Appt within 5-7 Days Complete  Home Care Screening Complete  Medication Review (RN CM) Complete

## 2023-08-13 NOTE — TOC Progression Note (Addendum)
 Transition of Care [facility]) - Progression Note    Patient Details  Name: Sarah Lane MRN: [id number] Date of Birth: October 23, 1965  Transition of Care [facility]) CM/SW Contact  [name], RN Phone Number: 08/13/2023, 1:00 PM  Clinical Narrative:     Received call from Turner at [facility] and spoke about the bed offer They can not accept the patient for rehab I asked her if she could do a long term bed offer Her Dialysis bed time and place will need to be switched Will need [facility] or [facility] or [facility] with the patient and she is aware and agreeable She mentioned that she will need to get her things from her friends and I explained I dont have resources for that, she stated that she will work it out  Expected Discharge Plan: Skilled Nursing Facility Barriers to Discharge: SNF Pending bed offer  Expected Discharge Plan and Services   Discharge Planning Services: CM Consult   Living arrangements for the past 2 months: Mobile Home                   DME Agency: NA                   Social Determinants of Health (SDOH) Interventions SDOH Screenings   Food Insecurity: Food Insecurity Present (08/09/2023)  Housing: Low Risk  (08/09/2023)  Recent Concern: Housing - Medium Risk (07/10/2023)  Transportation Needs: Unmet Transportation Needs (08/09/2023)  Utilities: Not At Risk (08/09/2023)  Financial Resource Strain: Medium Risk (05/17/2023)   Received from [facility]  Physical Activity: Insufficiently Active (05/17/2023)   Received from [facility]  Social Connections: Socially Isolated (05/18/2023)   Received from [facility]  Stress: No Stress Concern Present (05/18/2023)   Received from [facility]  Tobacco Use: Unknown (08/07/2023)  Health Literacy: Medium Risk (05/17/2023)   Received from [facility]    Readmission Risk Interventions    07/11/2023   11:51 AM  Readmission Risk Prevention Plan  Transportation  Screening Complete  PCP or Specialist Appt within 5-7 Days Complete  Home Care Screening Complete  Medication Review (RN CM) Complete

## 2023-08-13 NOTE — Consult Note (Addendum)
 NAME: Sarah Lane  DOB: 1965-09-13  MRN: [id number]  Date/Time: 08/13/2023 2:02 PM  REQUESTING PROVIDER: Dr.Yates Subjective:  REASON FOR CONSULT: MRSA UTI ? Sarah Lane is a 58 y.o. with a history of ovarian ca DM, CAD, chronic systolic heart failure due to NICM, CVA LMCA in June 2024 s/p thrombectomy, , HTN, HLD, CKD stage 3 a was in the hosptial 07/09/23-07/17/23 with AKI on CKD, started HD, anuric, treated for kleb UTI Prior to that was in [facility] 10/22-10/25 for N/V/D and diagnosed with cholelithiasis, underwent robotic lap chole on 06/23/23  Presented to the ED from dialysis center with N/V/D x 3 days on 08/07/23 No fever or chills  08/07/23  BP 117/59 !  Temp 98.1 F (36.7 C)  Pulse Rate 80  Resp 14  SpO2 98 %     Latest Reference Range & Units 08/07/23  WBC 4.0 - 10.5 K/uL 15.7 (H)  Hemoglobin 12.0 - 15.0 g/dL 87.6  HCT 63.9 - 53.9 % 35.4 (L)  Platelets 150 - 400 K/uL 312  Creatinine 0.44 - 1.00 mg/dL 8.21 (H)  CT abdomen and pelvis showed dilated rectum with large volume of stool Some bladder wall thickening concerning for cystitis  Blood culture sent UA > 50 WBC UC MRSA and started on bactrim  BC NG I am seeing her for UC MRSA which is an unusual organism to cause UTI   Past Medical History:  Diagnosis Date   Aortic atherosclerosis (HCC)    a. 03/2023 noted on CT.   Asthma    childhood asthma   Cancer (HCC)    ovarian cancer   Cardiomyopathy (HCC)    a. 01/2023 Echo [facility]): EF 15-20%, impaired relaxation, mildly to mod dil RV w/ mild RV dysfxn, mod PH, BAE; b. 03/2023 Echo: EF 30-35%, glob HK, GrI DD, mod reduced RV fxn, mod enlarged RV, RVSP 42.54mmHg, mild MR, mild-mod TR.   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 Dx 01/2023-->[facility] Echo: EF 15-20%; b. 03/2023 Echo: EF 30-35%.   Diabetes (HCC)    Diabetes mellitus without complication (HCC)    Expressive aphasia    a. 01/2023 L MCA  occlusive stroke s/p thrombectomy.   Family history of adverse reaction to anesthesia    Grand father had facial swelling   H/O ischemic left MCA stroke    a. 01/2023 s/p admission @ [facility]. CTA Head w/ M1 occlusion of L MCA, possible high-grade stenosis of distal A3 segment. S/p thrombectomy.   Hypertension    PSVT (paroxysmal supraventricular tachycardia) (HCC)    Right lower lobe pulmonary nodule    a. 03/2023 CT Chest - 14mm - stable.   Seizures (HCC)    petit mals at age 52 treated with medications. Last one age 17    Past Surgical History:  Procedure Laterality Date   ABDOMINAL HYSTERECTOMY  2011   ovarian cancer   DIALYSIS/PERMA CATHETER INSERTION N/A 07/13/2023   Procedure: DIALYSIS/PERMA CATHETER INSERTION;  Surgeon: Jama Cordella MATSU, MD;  Location: [facility] INVASIVE CV LAB;  Service: Cardiovascular;  Laterality: N/A;   IRRIGATION AND DEBRIDEMENT ABDOMEN N/A 12/31/2014   Procedure: IRRIGATION AND DEBRIDEMENT ABDOMINAL WALL ABSCESS;  Surgeon: Donnice Lunger, MD;  Location: [facility] ORS;  Service: General;  Laterality: N/A;   LYSIS OF ADHESION  06/23/2023   Procedure: LYSIS OF ADHESION;  Surgeon: Lane Shope, MD;  Location: [facility] ORS;  Service: General;;   RIGHT/LEFT HEART CATH AND  CORONARY ANGIOGRAPHY N/A 04/06/2023   Procedure: RIGHT/LEFT HEART CATH AND CORONARY ANGIOGRAPHY;  Surgeon: Mady Bruckner, MD;  Location: [facility] INVASIVE CV LAB;  Service: Cardiovascular;  Laterality: N/A;    Social History   Socioeconomic History   Marital status: Widowed    Spouse name: Not on file   Number of children: Not on file   Years of education: Not on file   Highest education level: Not on file  Occupational History   Not on file  Tobacco Use   Smoking status: Never    Passive exposure: Never   Smokeless tobacco: Not on file  Vaping Use   Vaping status: Never Used  Substance and Sexual Activity   Alcohol use: No   Drug use: No   Sexual activity: Never  Other Topics Concern   Not on file   Social History Narrative   Widowed.  Lives in [place] w/ roommate.  Does not routinely exercise.   Social Drivers of Health   Financial Resource Strain: Medium Risk (05/17/2023)   Received from [facility]   Overall Financial Resource Strain (CARDIA)    Difficulty of Paying Living Expenses: Somewhat hard  Food Insecurity: Food Insecurity Present (08/09/2023)   Hunger Vital Sign    Worried About Running Out of Food in the Last Year: Sometimes true    Ran Out of Food in the Last Year: Sometimes true  Transportation Needs: Unmet Transportation Needs (08/09/2023)   PRAPARE - Administrator, Civil Service (Medical): Yes    Lack of Transportation (Non-Medical): Yes  Physical Activity: Insufficiently Active (05/17/2023)   Received from [facility]   Exercise Vital Sign    Days of Exercise per Week: 2 days    Minutes of Exercise per Session: 60 min  Stress: No Stress Concern Present (05/18/2023)   Received from [facility] of Occupational [facility] - Occupational Stress Questionnaire    Feeling of Stress : Not at all  Social Connections: Socially Isolated (05/18/2023)   Received from [facility]   Social Connection and Isolation Panel [NHANES]    Frequency of Communication with Friends and Family: Once a week    Frequency of Social Gatherings with Friends and Family: Never    Attends Religious Services: Never    Database Administrator or Organizations: No    Attends Banker Meetings: Never    Marital Status: Widowed  Intimate Partner Violence: Not At Risk (08/09/2023)   Humiliation, Afraid, Rape, and Kick questionnaire    Fear of Current or Ex-Partner: No    Emotionally Abused: No    Physically Abused: No    Sexually Abused: No    Family History  Problem Relation Age of Onset   CAD Father        s/p CABG x 2 and stenting   Parkinson's disease Father    Allergies  Allergen Reactions   Jardiance [Empagliflozin]      Multiple side effect complications including: DKA, pancreatitis, renal failure. Avoid re-prescribing   Codeine Nausea And Vomiting   Ibuprofen Nausea And Vomiting   I? Current Facility-Administered Medications  Medication Dose Route Frequency Provider Last Rate Last Admin   acetaminophen  (TYLENOL ) tablet 650 mg  650 mg Oral Q6H PRN Mansy, Jan A, MD       Or   acetaminophen  (TYLENOL ) suppository 650 mg  650 mg Rectal Q6H PRN Mansy, Madison LABOR, MD       acidophilus (RISAQUAD)  capsule 1 capsule  1 capsule Oral Daily Vann, Jessica U, DO   1 capsule at 08/13/23 9097   aspirin  chewable tablet 81 mg  81 mg Oral Daily Mansy, Jan A, MD   81 mg at 08/13/23 0902   atorvastatin  (LIPITOR ) tablet 80 mg  80 mg Oral Daily Mansy, Jan A, MD   80 mg at 08/13/23 9097   Chlorhexidine  Gluconate Cloth 2 % PADS 6 each  6 each Topical Q0600 Barbarann Nest, MD       feeding supplement (NEPRO CARB STEADY) liquid 237 mL  237 mL Oral BID BM Barbarann Nest, MD       heparin  injection 5,000 Units  5,000 Units Subcutaneous Q8H Yates, Jennifer, MD   5,000 Units at 08/13/23 9485   insulin  aspart (novoLOG ) injection 0-5 Units  0-5 Units Subcutaneous QHS Vann, Jessica U, DO       insulin  aspart (novoLOG ) injection 0-9 Units  0-9 Units Subcutaneous TID WC Vann, Jessica U, DO   2 Units at 08/13/23 1248   metoprolol  succinate (TOPROL -XL) 24 hr tablet 12.5 mg  12.5 mg Oral Daily Mansy, Jan A, MD   12.5 mg at 08/13/23 9097   multivitamin (RENA-VIT) tablet 1 tablet  1 tablet Oral QHS Mansy, Jan A, MD   1 tablet at 08/12/23 2208   mupirocin  ointment (BACTROBAN ) 2 % 1 Application  1 Application Nasal BID Barbarann Nest, MD       ondansetron  (ZOFRAN ) tablet 4 mg  4 mg Oral Q6H PRN Mansy, Jan A, MD       pantoprazole  (PROTONIX ) EC tablet 40 mg  40 mg Oral Daily Mansy, Jan A, MD   40 mg at 08/13/23 9097   polyethylene glycol (MIRALAX  / GLYCOLAX ) packet 17 g  17 g Oral Daily Barbarann Nest, MD   17 g at 08/13/23 0900   psyllium  (HYDROCIL/METAMUCIL) 1 packet  1 packet Oral Daily Barbarann Nest, MD   1 packet at 08/13/23 0900   sulfamethoxazole -trimethoprim  (BACTRIM ) 400-80 MG per tablet 1 tablet  1 tablet Oral Q12H Yates, Jennifer, MD   1 tablet at 08/13/23 0900   traZODone  (DESYREL ) tablet 25 mg  25 mg Oral QHS PRN Mansy, Jan A, MD         Abtx:  Anti-infectives (From admission, onward)    Start     Dose/Rate Route Frequency Ordered Stop   08/10/23 1430  sulfamethoxazole -trimethoprim  (BACTRIM ) 400-80 MG per tablet 1 tablet        1 tablet Oral Every 12 hours 08/10/23 1322 08/17/23 0959   08/10/23 1400  metroNIDAZOLE  (FLAGYL ) IVPB 500 mg  Status:  Discontinued        500 mg 100 mL/hr over 60 Minutes Intravenous 2 times daily 08/10/23 1302 08/10/23 1356   08/08/23 2200  cefTRIAXone  (ROCEPHIN ) 2 g in sodium chloride  0.9 % 100 mL IVPB  Status:  Discontinued        2 g 200 mL/hr over 30 Minutes Intravenous Every 24 hours 08/07/23 2252 08/10/23 1356   08/08/23 1000  metroNIDAZOLE  (FLAGYL ) IVPB 500 mg  Status:  Discontinued        500 mg 100 mL/hr over 60 Minutes Intravenous Every 12 hours 08/07/23 2252 08/10/23 1302   08/07/23 2145  cefTRIAXone  (ROCEPHIN ) 2 g in sodium chloride  0.9 % 100 mL IVPB        2 g 200 mL/hr over 30 Minutes Intravenous Once 08/07/23 2140 08/07/23 2245   08/07/23 2145  metroNIDAZOLE  (FLAGYL ) IVPB 500 mg  500 mg 100 mL/hr over 60 Minutes Intravenous  Once 08/07/23 2140 08/07/23 2348       REVIEW OF SYSTEMS:  Const: negative fever, negative chills, negative weight loss Eyes: negative diplopia or visual changes, negative eye pain ENT: negative coryza, negative sore throat Resp: negative cough, hemoptysis, has dyspnea Cards: negative for chest pain, palpitations, lower extremity edema GU: negative for frequency, dysuria and hematuria GI:  abdominal pain, diarrhea, no bleeding, constipation Skin: negative for rash and pruritus Heme:  easy bruising  MS: weakness Neurolo:negative  for headaches, has dizziness, vertigo, memory problems  Psych:  anxiety, depression  Endocrine: , diabetes Allergy/Immunology- as above Objective:  VITALS:  BP 112/67 (BP Location: Left Arm)   Pulse 77   Temp 98.4 F (36.9 C)   Resp 16   Ht 5' (1.524 m)   Wt 59.2 kg   SpO2 96%   BMI 25.49 kg/m  LDA Rt internal jugular HD cath Foley cath PHYSICAL EXAM:  General: Alert, cooperative, no distress, pale expressive aphasia.  Head: Normocephalic, without obvious abnormality, atraumatic. Eyes: Conjunctivae clear, anicteric sclerae. Pupils are equal ENT Nares normal. No drainage or sinus tenderness. Lips, mucosa, and tongue normal. No Thrush Neck: Supple, symmetrical, no adenopathy, thyroid: non tender no carotid bruit and no JVD. Back: No CVA tenderness. Lungs: Clear to auscultation bilaterally. No Wheezing or Rhonchi. No rales. Heart: Regular rate and rhythm, no murmur, rub or gallop. Abdomen: Soft, some tenderness- lap scar healed well bruising abd wall . Bowel sounds normal. No masses Extremities: atraumatic, no cyanosis. No edema. No clubbing Skin: No rashes or lesions. Or bruising Lymph: Cervical, supraclavicular normal. Neurologic: Grossly non-focal Pertinent Labs Lab Results CBC    Component Value Date/Time   WBC 9.3 08/13/2023 0456   RBC 3.10 (L) 08/13/2023 0456   HGB 9.3 (L) 08/13/2023 0456   HCT 28.1 (L) 08/13/2023 0456   PLT 249 08/13/2023 0456   MCV 90.6 08/13/2023 0456   MCH 30.0 08/13/2023 0456   MCHC 33.1 08/13/2023 0456   RDW 14.1 08/13/2023 0456   LYMPHSABS 3.0 08/13/2023 0456   MONOABS 0.8 08/13/2023 0456   EOSABS 0.4 08/13/2023 0456   BASOSABS 0.1 08/13/2023 0456       Latest Ref Rng & Units 08/13/2023    4:56 AM 08/11/2023    4:28 AM 08/10/2023    8:46 AM  CMP  Glucose 70 - 99 mg/dL 895  885  874   BUN 6 - 20 mg/dL 12  7  13    Creatinine 0.44 - 1.00 mg/dL 8.55  9.02  8.92   Sodium 135 - 145 mmol/L 136  135  140   Potassium 3.5 - 5.1 mmol/L 4.1   3.8  4.6   Chloride 98 - 111 mmol/L 103  102  109   CO2 22 - 32 mmol/L 27  24  24    Calcium  8.9 - 10.3 mg/dL 9.5  8.9  9.7       Microbiology: Recent Results (from the past 240 hours)  C Difficile Quick Screen w PCR reflex     Status: None   Collection Time: 08/07/23  6:59 PM   Specimen: STOOL  Result Value Ref Range Status   C Diff antigen NEGATIVE NEGATIVE Final   C Diff toxin NEGATIVE NEGATIVE Final   C Diff interpretation No C. difficile detected.  Final    Comment: Performed at [facility], [address], [place], [place] [address]  Urine Culture (for pregnant, neutropenic or urologic  patients or patients with an indwelling urinary catheter)     Status: Abnormal   Collection Time: 08/07/23  8:20 PM   Specimen: Urine, Catheterized  Result Value Ref Range Status   Specimen Description   Final    URINE, CATHETERIZED Performed at [facility], [address] [place], [place] [address]    Special Requests   Final    NONE Performed at [facility], [address] [place], [place] [address]    Culture (A)  Final    >=100,000 COLONIES/mL METHICILLIN RESISTANT STAPHYLOCOCCUS AUREUS   Report Status 08/10/2023 FINAL  Final   Organism ID, Bacteria METHICILLIN RESISTANT STAPHYLOCOCCUS AUREUS (A)  Final      Susceptibility   Methicillin resistant staphylococcus aureus - MIC*    CIPROFLOXACIN >=8 RESISTANT Resistant     GENTAMICIN <=0.5 SENSITIVE Sensitive     NITROFURANTOIN <=16 SENSITIVE Sensitive     OXACILLIN >=4 RESISTANT Resistant     TETRACYCLINE <=1 SENSITIVE Sensitive     VANCOMYCIN  1 SENSITIVE Sensitive     TRIMETH /SULFA  <=10 SENSITIVE Sensitive     RIFAMPIN <=0.5 SENSITIVE Sensitive     Inducible Clindamycin NEGATIVE Sensitive     LINEZOLID 2 SENSITIVE Sensitive     * >=100,000 COLONIES/mL METHICILLIN RESISTANT STAPHYLOCOCCUS AUREUS  Culture, blood (Routine X 2) w Reflex to ID Panel     Status: None   Collection Time: 08/07/23 11:29  PM   Specimen: BLOOD  Result Value Ref Range Status   Specimen Description BLOOD BLOOD RIGHT ARM  Final   Special Requests   Final    BOTTLES DRAWN AEROBIC AND ANAEROBIC Blood Culture results may not be optimal due to an inadequate volume of blood received in culture bottles   Culture   Final    NO GROWTH 5 DAYS Performed at [facility], [address] [place], [place] [address]    Report Status 08/13/2023 FINAL  Final  Culture, blood (Routine X 2) w Reflex to ID Panel     Status: None   Collection Time: 08/07/23 11:29 PM   Specimen: BLOOD  Result Value Ref Range Status   Specimen Description BLOOD BLOOD LEFT ARM  Final   Special Requests   Final    BOTTLES DRAWN AEROBIC AND ANAEROBIC Blood Culture results may not be optimal due to an inadequate volume of blood received in culture bottles   Culture   Final    NO GROWTH 5 DAYS Performed at [facility], [address], [place], [place] [address]    Report Status 08/13/2023 FINAL  Final  MRSA Next Gen by PCR, Nasal     Status: Abnormal   Collection Time: 08/10/23  6:00 AM   Specimen: Nasal Mucosa; Nasal Swab  Result Value Ref Range Status   MRSA by PCR Next Gen DETECTED (A) NOT DETECTED Final    Comment: RESULT CALLED TO, READ BACK BY AND VERIFIED WITH: Ave, Katrina 08/10/23 0732 MW (NOTE) The GeneXpert MRSA Assay (FDA approved for NASAL specimens only), is one component of a comprehensive MRSA colonization surveillance program. It is not intended to diagnose MRSA infection nor to guide or monitor treatment for MRSA infections. Test performance is not FDA approved in patients less than 79 years old. Performed at [facility], [address] [place], [place] [address]     IMAGING RESULTS: Ct abd/pelvis-  showed dilated rectum with large volume of stool Some bladder wall thickening concerning for cystitis I have personally reviewed the  films ? Impression/Recommendation ?MRSA in urine  culture- MRSA is not an organism to cause UTI, usually it is a systemic spill over and can also be a contaminant from skin colonization or if foley present. The culture is from admission and foley was placed only yesterday Blood culture is negative , so currently not concerned about HD cath infection or systemic infection Pt is on bactrim  -complete 7 days. No need to repeat urine culture after that Pt also has MRSA nares As she has central line recommend  decolonization with mupirocin  nasal ointment and CHG bathing  AKI on CKD- started dialysis? CAD CHF  H/o CVA- has some expressive aphasia   DM- management a per primary team   Discussed with patient, requesting provider  ID will sign off- call if needed

## 2023-08-13 NOTE — Progress Notes (Signed)
 [facility]  ROUNDING NOTE   Subjective:   Sarah Lane  is a 58 y.o. female with a PMHx of diabetes mellitus type 2, seizure disorder, hypertension, dysrhythmia, and history of ovarian cancer.  Patient presents to the emergency department with nausea and vomiting. She has been admitted for Acute gastroenteritis [K52.9] UTI (urinary tract infection) [N39.0] ESRD on hemodialysis (HCC) [N18.6, Z99.2] Cystitis [N30.90] Stercoral colitis [K52.89]  Patient seen resting quietly in bed Denies pain or discomfort Appetite appropriate Room air No lower extremity edema   Objective:  Vital signs in last 24 hours:  Temp:  [98.4 F (36.9 C)] 98.4 F (36.9 C) (01/02 2222) Pulse Rate:  [77] 77 (01/03 0817) Resp:  [16-20] 16 (01/03 0817) BP: (104-112)/(60-67) 112/67 (01/03 0817) SpO2:  [96 %-98 %] 96 % (01/03 0817)  Weight change:  Filed Weights   08/07/23 1431 08/10/23 0810 08/10/23 1245  Weight: 58.1 kg 60.3 kg 59.2 kg    Intake/Output: I/O last 3 completed shifts: In: 437 [P.O.:437] Out: 300 [Urine:300]   Intake/Output this shift:  Total I/O In: 478 [P.O.:478] Out: -   Physical Exam: General: NAD, laying in bed  Head: Normocephalic, atraumatic.  Dry oral mucosal membranes  Eyes: Anicteric  Lungs:  Clear to auscultation, normal effort  Heart: Regular rate and rhythm  Abdomen:  Soft, nontender, nondistended  Extremities: No peripheral edema.  Neurologic: Alert and oriented, moving all four extremities  Skin: No lesions  Access: Rt chest permcath  GU-Foley catheter  Basic Metabolic Panel: Recent Labs  Lab 08/08/23 0540 08/09/23 0621 08/10/23 0846 08/11/23 0428 08/13/23 0456  NA 138 138 140 135 136  K 2.7* 5.0 4.6 3.8 4.1  CL 104 108 109 102 103  CO2 27 25 24 24 27   GLUCOSE 137* 100* 125* 114* 104*  BUN 24* 19 13 7 12   CREATININE 1.39* 1.18* 1.07* 0.97 1.44*  CALCIUM  9.5 9.1 9.7 8.9 9.5  MG 1.6*  --   --   --   --     Liver Function  Tests: Recent Labs  Lab 08/07/23 1435  AST 20  ALT 15  ALKPHOS 76  BILITOT 1.2*  PROT 7.0  ALBUMIN 3.7   Recent Labs  Lab 08/07/23 1435  LIPASE 35    No results for input(s): AMMONIA in the last 168 hours.  CBC: Recent Labs  Lab 08/08/23 0540 08/09/23 0621 08/10/23 0846 08/11/23 0428 08/13/23 0456  WBC 11.2* 9.5 8.2 8.0 9.3  NEUTROABS  --   --  4.5 4.3 4.7  HGB 9.8* 9.7* 10.1* 10.4* 9.3*  HCT 28.6* 28.6* 30.2* 31.1* 28.1*  MCV 88.3 88.8 92.6 89.6 90.6  PLT 231 232 269 268 249    Cardiac Enzymes: No results for input(s): CKTOTAL, CKMB, CKMBINDEX, TROPONINI in the last 168 hours.  BNP: Invalid input(s): POCBNP  CBG: Recent Labs  Lab 08/12/23 1229 08/12/23 1641 08/12/23 2257 08/13/23 0818 08/13/23 1135  GLUCAP 149* 102* 141* 122* 154*    Microbiology: Results for orders placed or performed during the hospital encounter of 08/07/23  C Difficile Quick Screen w PCR reflex     Status: None   Collection Time: 08/07/23  6:59 PM   Specimen: STOOL  Result Value Ref Range Status   C Diff antigen NEGATIVE NEGATIVE Final   C Diff toxin NEGATIVE NEGATIVE Final   C Diff interpretation No C. difficile detected.  Final    Comment: Performed at [facility], [address] [place], [place]  [address]  Urine Culture (for pregnant, neutropenic or urologic patients or patients with an indwelling urinary catheter)     Status: Abnormal   Collection Time: 08/07/23  8:20 PM   Specimen: Urine, Catheterized  Result Value Ref Range Status   Specimen Description   Final    URINE, CATHETERIZED Performed at [facility], [address], [place], [place] [address]    Special Requests   Final    NONE Performed at [facility], [address] [place], [place] [address]    Culture (A)  Final    >=100,000 COLONIES/mL METHICILLIN RESISTANT STAPHYLOCOCCUS AUREUS   Report Status 08/10/2023 FINAL  Final   Organism ID, Bacteria METHICILLIN  RESISTANT STAPHYLOCOCCUS AUREUS (A)  Final      Susceptibility   Methicillin resistant staphylococcus aureus - MIC*    CIPROFLOXACIN >=8 RESISTANT Resistant     GENTAMICIN <=0.5 SENSITIVE Sensitive     NITROFURANTOIN <=16 SENSITIVE Sensitive     OXACILLIN >=4 RESISTANT Resistant     TETRACYCLINE <=1 SENSITIVE Sensitive     VANCOMYCIN  1 SENSITIVE Sensitive     TRIMETH /SULFA  <=10 SENSITIVE Sensitive     RIFAMPIN <=0.5 SENSITIVE Sensitive     Inducible Clindamycin NEGATIVE Sensitive     LINEZOLID 2 SENSITIVE Sensitive     * >=100,000 COLONIES/mL METHICILLIN RESISTANT STAPHYLOCOCCUS AUREUS  Culture, blood (Routine X 2) w Reflex to ID Panel     Status: None   Collection Time: 08/07/23 11:29 PM   Specimen: BLOOD  Result Value Ref Range Status   Specimen Description BLOOD BLOOD RIGHT ARM  Final   Special Requests   Final    BOTTLES DRAWN AEROBIC AND ANAEROBIC Blood Culture results may not be optimal due to an inadequate volume of blood received in culture bottles   Culture   Final    NO GROWTH 5 DAYS Performed at [facility], [address] [place], [place] [address]    Report Status 08/13/2023 FINAL  Final  Culture, blood (Routine X 2) w Reflex to ID Panel     Status: None   Collection Time: 08/07/23 11:29 PM   Specimen: BLOOD  Result Value Ref Range Status   Specimen Description BLOOD BLOOD LEFT ARM  Final   Special Requests   Final    BOTTLES DRAWN AEROBIC AND ANAEROBIC Blood Culture results may not be optimal due to an inadequate volume of blood received in culture bottles   Culture   Final    NO GROWTH 5 DAYS Performed at [facility], [address] [place], [place] [address]    Report Status 08/13/2023 FINAL  Final  MRSA Next Gen by PCR, Nasal     Status: Abnormal   Collection Time: 08/10/23  6:00 AM   Specimen: Nasal Mucosa; Nasal Swab  Result Value Ref Range Status   MRSA by PCR Next Gen DETECTED (A) NOT DETECTED Final    Comment: RESULT CALLED TO,  READ BACK BY AND VERIFIED WITH: Ave, Katrina 08/10/23 0732 MW (NOTE) The GeneXpert MRSA Assay (FDA approved for NASAL specimens only), is one component of a comprehensive MRSA colonization surveillance program. It is not intended to diagnose MRSA infection nor to guide or monitor treatment for MRSA infections. Test performance is not FDA approved in patients less than 81 years old. Performed at [facility], [address] [place], [place] [address]     Coagulation Studies: No results for input(s): LABPROT, INR in the last 72 hours.  Urinalysis: No results for input(s): COLORURINE, LABSPEC, PHURINE, GLUCOSEU, HGBUR, BILIRUBINUR, KETONESUR, PROTEINUR, UROBILINOGEN, NITRITE, LEUKOCYTESUR in the last 72 hours.  Invalid input(s): APPERANCEUR      Imaging: No results found.    Medications:       acidophilus  1 capsule Oral Daily   aspirin   81 mg Oral Daily   atorvastatin   80 mg Oral Daily   Chlorhexidine  Gluconate Cloth  6 each Topical Q0600   feeding supplement (NEPRO CARB STEADY)  237 mL Oral BID BM   heparin  injection (subcutaneous)  5,000 Units Subcutaneous Q8H   insulin  aspart  0-5 Units Subcutaneous QHS   insulin  aspart  0-9 Units Subcutaneous TID WC   metoprolol  succinate  12.5 mg Oral Daily   multivitamin  1 tablet Oral QHS   mupirocin  ointment  1 Application Nasal BID   pantoprazole   40 mg Oral Daily   polyethylene glycol  17 g Oral Daily   psyllium  1 packet Oral Daily   sulfamethoxazole -trimethoprim   1 tablet Oral Q12H   acetaminophen  **OR** acetaminophen , ondansetron  **OR** [DISCONTINUED] ondansetron  (ZOFRAN ) IV, traZODone   Assessment/ Plan:  Sarah Lane is a 58 y.o.  female  is a 58 y.o. female with a PMHx of diabetes mellitus type 2, seizure disorder, hypertension, dysrhythmia, and history of ovarian cancer.  Patient presents to the emergency department complaining of nausea, vomiting, and diarrhea.   Patient has been admitted for Acute gastroenteritis [K52.9] UTI (urinary tract infection) [N39.0] ESRD on hemodialysis (HCC) [N18.6, Z99.2] Cystitis [N30.90] Stercoral colitis [K52.89]  [facility]/T-S/right chest PermCath  Acute Kidney Injury on chronic kidney disease stage IIIa with baseline creatinine 1.27 and GFR of 49 on 04/07/23.  Now receiving hemodialysis, initiated on 12/3. Acute kidney injury resulting in ATN secondary to volume depletion and continued ACE/ARB use. -Appreciate nursing placing Foley catheter for collection of 24-hour creatinine clearance. - We utilized these results to determine if patient is a candidate to discontinue further dialysis treatments. - We are concerned that with monitoring her urine output, patient does not appear to meet the 1 L minimal urine output -Will await results and determine if patient needs dialysis treatments tomorrow.  Lab Results  Component Value Date   CREATININE 1.44 (H) 08/13/2023   CREATININE 0.97 08/11/2023   CREATININE 1.07 (H) 08/10/2023    Intake/Output Summary (Last 24 hours) at 08/13/2023 1346 Last data filed at 08/13/2023 1344 Gross per 24 hour  Intake 678 ml  Output 300 ml  Net 378 ml    2.  Hypokalemia.  Potassium 4.1  3. Anemia of chronic kidney disease Lab Results  Component Value Date   HGB 9.3 (L) 08/13/2023    Hemoglobin acceptable.  Will consider low-dose EPO.  4. Diabetes mellitus type II with chronic kidney disease: noninsulin dependent. Home regimen includes metformin. Most recent hemoglobin A1c is 8.2 on 03/31/23.   Glucose well controlled  5. Secondary Hyperparathyroidism: with outpatient labs: PTH 153, phosphorus 3.8, calcium  10.8 on 07/29/23.   Lab Results  Component Value Date   CALCIUM  9.5 08/13/2023   CAION 1.22 04/06/2023   PHOS 2.3 (L) 07/17/2023    Will continue to monitor bone minerals during this admission.   LOS: 5 Ciana Simmon 1/3/20251:46 PM

## 2023-08-13 NOTE — Progress Notes (Signed)
 Physical Therapy Treatment Patient Details Name: Sarah Lane MRN: [id number] DOB: 03-17-1966 Today's Date: 08/13/2023   History of Present Illness Pt is a 58 y/o female admitted secondary to nausea, vomiting and diahrrea. Pt found to have a UTI and sepsis. 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    PT Comments  Pt resting in bed upon PT arrival; pt agreeable to therapy with some encouragement.  No c/o pain during session.  Performed sit to stands (5 reps x2) with RW use--pt requiring vc's to shift weight forward to prevent posterior lean.  Pt able to side step to R along bed a few feet with RW and CGA.  Pt declined any ambulation d/t concerns of causing BM incontinence.  Will continue to focus on strengthening and progressive functional mobility per pt tolerance.   If plan is discharge home, recommend the following: Assistance with cooking/housework;Help with stairs or ramp for entrance;Assist for transportation;A little help with walking and/or transfers;A little help with bathing/dressing/bathroom   Can travel by private vehicle        Equipment Recommendations  Rolling walker (2 wheels);BSC/3in1;Wheelchair (measurements PT);Wheelchair cushion (measurements PT)    Recommendations for Other Services       Precautions / Restrictions Precautions Precautions: Fall Precaution Comments: R IJ HD catheter Restrictions Weight Bearing Restrictions Per Provider Order: No     Mobility  Bed Mobility Overal bed mobility: Needs Assistance Bed Mobility: Supine to Sit, Sit to Supine     Supine to sit: Supervision, HOB elevated Sit to supine: Supervision, HOB elevated   General bed mobility comments: increased effort/time to perform on own; use of bed rail    Transfers Overall transfer level: Needs assistance Equipment used: Rolling walker (2 wheels) Transfers: Sit  to/from Stand Sit to Stand: Contact guard assist, Min assist           General transfer comment: x5 plus x5 trials standing from bed; assist to steady upon standing d/t posterior lean; vc's to shift weight forward when standing to improve balance    Ambulation/Gait Ambulation/Gait assistance: Contact guard assist Gait Distance (Feet):  (sidestepped to R along bed a few feet) Assistive device: Rolling walker (2 wheels)         General Gait Details: limited d/t pt concerned about having BM with too much activity   Stairs             Wheelchair Mobility     Tilt Bed    Modified Rankin (Stroke Patients Only)       Balance Overall balance assessment: Needs assistance Sitting-balance support: No upper extremity supported, Feet supported Sitting balance-Leahy Scale: Good Sitting balance - Comments: steady reaching within BOS   Standing balance support: Bilateral upper extremity supported, Reliant on assistive device for balance Standing balance-Leahy Scale: Poor Standing balance comment: initial assist to steady once standing                            Cognition Arousal: Alert Behavior During Therapy: WFL for tasks assessed/performed Overall Cognitive Status: No family/caregiver present to determine baseline cognitive functioning Area of Impairment: Problem solving, Safety/judgement                         Safety/Judgement: Decreased awareness of deficits   Problem Solving: Slow processing, Requires  verbal cues General Comments: A&O x4        Exercises General Exercises - Lower Extremity Long Arc Quad: AROM, Strengthening, Both, 10 reps, Seated Hip Flexion/Marching: AROM, Strengthening, Both, 10 reps, Seated    General Comments General comments (skin integrity, edema, etc.): Foley catheter in place      Pertinent Vitals/Pain Pain Assessment Pain Assessment: No/denies pain Pain Intervention(s): Limited activity within patient's  tolerance, Monitored during session    Home Living                          Prior Function            PT Goals (current goals can now be found in the care plan section) Acute Rehab PT Goals Patient Stated Goal: to feel better PT Goal Formulation: With patient Time For Goal Achievement: 08/22/23 Potential to Achieve Goals: Good Progress towards PT goals: Progressing toward goals    Frequency    Min 1X/week      PT Plan      Co-evaluation              AM-PAC PT 6 Clicks Mobility   Outcome Measure  Help needed turning from your back to your side while in a flat bed without using bedrails?: A Little Help needed moving from lying on your back to sitting on the side of a flat bed without using bedrails?: A Little Help needed moving to and from a bed to a chair (including a wheelchair)?: A Little Help needed standing up from a chair using your arms (e.g., wheelchair or bedside chair)?: A Little Help needed to walk in hospital room?: A Little Help needed climbing 3-5 steps with a railing? : A Lot 6 Click Score: 17    End of Session   Activity Tolerance: Patient tolerated treatment well Patient left: in bed;with call bell/phone within reach;with bed alarm set Nurse Communication: Mobility status;Precautions PT Visit Diagnosis: Other abnormalities of gait and mobility (R26.89)     Time: 9060-9043 PT Time Calculation (min) (ACUTE ONLY): 17 min  Charges:    $Therapeutic Activity: 8-22 mins PT General Charges $$ ACUTE PT VISIT: 1 Visit                     Damien Caulk, PT 08/13/23, 2:04 PM

## 2023-08-14 DIAGNOSIS — R197 Diarrhea, unspecified: Secondary | ICD-10-CM | POA: Diagnosis not present

## 2023-08-14 DIAGNOSIS — R112 Nausea with vomiting, unspecified: Secondary | ICD-10-CM | POA: Diagnosis not present

## 2023-08-14 LAB — CBC WITH DIFFERENTIAL/PLATELET
Abs Immature Granulocytes: 0.39 10*3/uL — ABNORMAL HIGH (ref 0.00–0.07)
Basophils Absolute: 0.1 10*3/uL (ref 0.0–0.1)
Basophils Relative: 1 %
Eosinophils Absolute: 0.4 10*3/uL (ref 0.0–0.5)
Eosinophils Relative: 4 %
HCT: 27.5 % — ABNORMAL LOW (ref 36.0–46.0)
Hemoglobin: 9.1 g/dL — ABNORMAL LOW (ref 12.0–15.0)
Immature Granulocytes: 4 %
Lymphocytes Relative: 32 %
Lymphs Abs: 2.8 10*3/uL (ref 0.7–4.0)
MCH: 31 pg (ref 26.0–34.0)
MCHC: 33.1 g/dL (ref 30.0–36.0)
MCV: 93.5 fL (ref 80.0–100.0)
Monocytes Absolute: 0.7 10*3/uL (ref 0.1–1.0)
Monocytes Relative: 8 %
Neutro Abs: 4.5 10*3/uL (ref 1.7–7.7)
Neutrophils Relative %: 51 %
Platelets: 240 10*3/uL (ref 150–400)
RBC: 2.94 MIL/uL — ABNORMAL LOW (ref 3.87–5.11)
RDW: 14.1 % (ref 11.5–15.5)
WBC: 8.8 10*3/uL (ref 4.0–10.5)
nRBC: 0 % (ref 0.0–0.2)

## 2023-08-14 LAB — BASIC METABOLIC PANEL
Anion gap: 5 (ref 5–15)
BUN: 14 mg/dL (ref 6–20)
CO2: 25 mmol/L (ref 22–32)
Calcium: 9.4 mg/dL (ref 8.9–10.3)
Chloride: 109 mmol/L (ref 98–111)
Creatinine, Ser: 1.44 mg/dL — ABNORMAL HIGH (ref 0.44–1.00)
GFR, Estimated: 42 mL/min — ABNORMAL LOW (ref >60)
Glucose, Bld: 134 mg/dL — ABNORMAL HIGH (ref 70–99)
Potassium: 4.1 mmol/L (ref 3.5–5.1)
Sodium: 139 mmol/L (ref 135–145)

## 2023-08-14 LAB — GLUCOSE, CAPILLARY
Glucose-Capillary: 114 mg/dL — ABNORMAL HIGH (ref 70–99)
Glucose-Capillary: 168 mg/dL — ABNORMAL HIGH (ref 70–99)
Glucose-Capillary: 119 mg/dL — ABNORMAL HIGH (ref 70–99)
Glucose-Capillary: 148 mg/dL — ABNORMAL HIGH (ref 70–99)

## 2023-08-14 NOTE — Progress Notes (Signed)
 [facility]  PROGRESS NOTE   Subjective:   Patient is awake and alert.  She finished her 24-hour urine collection.  Denies any chest pain or shortness of breath.  Objective:  Vital signs: Blood pressure 99/61, pulse 86, temperature 98.1 F (36.7 C), resp. rate 18, height 5' (1.524 m), weight 59.2 kg, SpO2 97%.  Intake/Output Summary (Last 24 hours) at 08/14/2023 1637 Last data filed at 08/14/2023 1450 Gross per 24 hour  Intake 600 ml  Output 125 ml  Net 475 ml   Filed Weights   08/07/23 1431 08/10/23 0810 08/10/23 1245  Weight: 58.1 kg 60.3 kg 59.2 kg     Physical Exam: General:  No acute distress  Head:  Normocephalic, atraumatic. Moist oral mucosal membranes  Eyes:  Anicteric  Neck:  Supple  Lungs:   Clear to auscultation, normal effort  Heart:  S1S2 no rubs  Abdomen:   Soft, nontender, bowel sounds present  Extremities:  peripheral edema.  Neurologic:  Awake, alert, following commands  Skin:  No lesions  Access:     Basic Metabolic Panel: Recent Labs  Lab 08/08/23 0540 08/09/23 0621 08/10/23 0846 08/11/23 0428 08/13/23 0456 08/14/23 0359  NA 138 138 140 135 136 139  K 2.7* 5.0 4.6 3.8 4.1 4.1  CL 104 108 109 102 103 109  CO2 27 25 24 24 27 25   GLUCOSE 137* 100* 125* 114* 104* 134*  BUN 24* 19 13 7 12 14   CREATININE 1.39* 1.18* 1.07* 0.97 1.44* 1.44*  CALCIUM  9.5 9.1 9.7 8.9 9.5 9.4  MG 1.6*  --   --   --   --   --    GFR: Estimated Creatinine Clearance: 33.9 mL/min (A) (by C-G formula based on SCr of 1.44 mg/dL (H)).  Liver Function Tests: No results for input(s): AST, ALT, ALKPHOS, BILITOT, PROT, ALBUMIN in the last 168 hours. No results for input(s): LIPASE, AMYLASE in the last 168 hours. No results for input(s): AMMONIA in the last 168 hours.  CBC: Recent Labs  Lab 08/09/23 0621 08/10/23 0846 08/11/23 0428 08/13/23 0456 08/14/23 0359  WBC 9.5 8.2 8.0 9.3 8.8  NEUTROABS  --  4.5 4.3 4.7 4.5  HGB 9.7* 10.1*  10.4* 9.3* 9.1*  HCT 28.6* 30.2* 31.1* 28.1* 27.5*  MCV 88.8 92.6 89.6 90.6 93.5  PLT 232 269 268 249 240     HbA1C: Hgb A1c MFr Bld  Date/Time Value Ref Range Status  03/31/2023 02:49 PM 8.2 (H) 4.8 - 5.6 % Final    Comment:    (NOTE) Pre diabetes:          5.7%-6.4%  Diabetes:              >6.4%  Glycemic control for   <7.0% adults with diabetes   12/31/2014 09:08 PM 6.9 (H) 4.8 - 5.6 % Final    Comment:    (NOTE)         Pre-diabetes: 5.7 - 6.4         Diabetes: >6.4         Glycemic control for adults with diabetes: <7.0     Urinalysis: No results for input(s): COLORURINE, LABSPEC, PHURINE, GLUCOSEU, HGBUR, BILIRUBINUR, KETONESUR, PROTEINUR, UROBILINOGEN, NITRITE, LEUKOCYTESUR in the last 72 hours.  Invalid input(s): APPERANCEUR    Imaging: No results found.   Medications:     acidophilus  1 capsule Oral Daily   aspirin   81 mg Oral Daily   atorvastatin   80 mg Oral Daily  Chlorhexidine  Gluconate Cloth  6 each Topical Q0600   feeding supplement (NEPRO CARB STEADY)  237 mL Oral BID BM   heparin  injection (subcutaneous)  5,000 Units Subcutaneous Q8H   insulin  aspart  0-5 Units Subcutaneous QHS   insulin  aspart  0-9 Units Subcutaneous TID WC   metoprolol  succinate  12.5 mg Oral Daily   multivitamin  1 tablet Oral QHS   mupirocin  ointment  1 Application Nasal BID   pantoprazole   40 mg Oral Daily   polyethylene glycol  17 g Oral Daily   psyllium  1 packet Oral Daily   sulfamethoxazole -trimethoprim   1 tablet Oral Q12H    Assessment/ Plan:     58 yo with h/o asthma, DM, HTN, PSVT, seizure d/o, ESRD recently started on HD, and ovarian CA s/p TAH/BSO who presented on 12/28 with n/v/d.  Now with acute gastroenteritis and urinary tract infection..    #1: Acute kidney injury on chronic kidney disease: Patient had initially anuric renal failure and was initiated on dialysis.  Presently on twice a week dialysis.  She had 24-hour urine  studies done.  Her creatinine is 1.4 and she is asymptomatic.  Will hold dialysis and monitor closely.  #2: Urinary tract infection: Patient has been on Bactrim  which include sulfamethoxazole  and trimethoprim .  Will need to monitor her potassium levels closely along with BUN.  #3: Hyperlipidemia: Patient has been on very high doses of atorvastatin  which may need to be adjusted.  #4: Diabetes: Continue insulin  as ordered.  Will follow-up with 24-hour urine study results. Labs and medications reviewed. Will continue to follow along with you.   LOS: 6 Quinto Tippy, MD [facility] 1/4/20254:37 PM

## 2023-08-14 NOTE — Plan of Care (Signed)

## 2023-08-14 NOTE — Progress Notes (Signed)
 At 05:00 this nurse was made aware of pt's concerns regarding her requested assistance being seen to promptly. Pt stated to NT that she has reportedly has been sitting in her own urine for an hour and nobody has come to see her. NT and nurse did hour routine rounding and did not detect any soiling of the pt's bedding; furthermore, the pt did not request assistance during the alleged timeframe. The Pt's call light was promptly answered and both this nurse and the NT were made aware of the pt's concerns by the patient at that time. Education was given by this nurse stating, if she is able to assist us  in maintaining her skin integrity, she should swiftly inform the NT or this nurse so we may address the situation. During 0600 med pass, pt states that this nurse attempted to give meds while the pt was asleep; however, this nurse awoke the pt before administering heparin  subcutaneous.

## 2023-08-14 NOTE — Progress Notes (Signed)
 Progress Note   Patient: Sarah Lane FMW:[id number] DOB: Mar 23, 1966 DOA: 08/07/2023     6 DOS: the patient was seen and examined on 08/14/2023   Brief hospital course: 58yo with h/o asthma, DM, HTN, PSVT, seizure d/o, ESRD recently started on HD, and ovarian CA s/p TAH/BSO who presented on 12/28 with n/v/d.  UA with >100k colonies Staph aureus.    Assessment and Plan:  N/V/D Patient presented with 1 week of n/v/d Imaging with dilated rectum and concern for stercoral colitis She was started on Ceftriaxone  and metronidazole  -> Bactrim  C diff negative -bowel care/disimpaction--- Needs a good bowel regimen She is continuing to have diarrhea which is thought to be overflow Continue Miralax , metamucil, dulcolax    UTI Sepsis ruled out UA with >100k colonies Staph aureus, MRSA Started on ceftriaxone  but will change to Bactrim  per pharmacy Blood cultures NTD Dr. Fayette consulted   Recent onset ESRD Stage 3b CKD on admission in 03/2023, bordering on stage 4 Cr 5.85 on admission on 11/29 with anuria not responsive to IVF Vascular surgery consulted for HD access during that admission, s/p Right IJ tunneled dialysis catheter  Initiated inpatient -> outpatient HD  She reports that she is now requiring only twice weekly HD Continue Renavite Per nephrology-- PRN HD   DM Recent A1c was 5.3 She is not requiring medications for this issue currently   Dyslipidemia Continue Lipitor    Essential hypertension Continue Toprol -XL   GERD without esophagitis On PPI   Chronic HFrEF (heart failure with reduced ejection fraction)  Echo on 8/22 with EF 30-35%, grade 1 diastolic dysfunction Appears compensated Volume control with HD   CAD R/LHC on 04/06/23 with severe single-vessel CAD with occlusion to vessel too small and distal for PCI Medication management   Nutrition Status: Nutrition Problem: Moderate Malnutrition Etiology: social / environmental circumstances Signs/Symptoms:  percent weight loss, mild fat depletion, moderate fat depletion, mild muscle depletion, moderate muscle depletion Interventions: MVI, Nepro shake    Other Appears likely to need additional housing, as her roommate situation is not working out per patient report PT/OT evaluated and are recommending SNF rehab She will also need assistance with housing resources post-rehab She is willing to give up her monthly check in order to make a facility happen and is awaiting placement at this time       Consultants: ID PT/OT Nutrition TOC team   Procedures: None   Antibiotics: Ceftriaxone  12/29-31 Bactrim  12/31-1/6      Subjective: eating breakfast-- knows she will go for HD today   Objective: Vitals:   08/13/23 2318 08/14/23 0818  BP: 103/64 118/64  Pulse: 76 80  Resp: 18 14  Temp: 98.2 F (36.8 C) 98.2 F (36.8 C)  SpO2: 98% 97%    Intake/Output Summary (Last 24 hours) at 08/14/2023 0905 Last data filed at 08/13/2023 1900 Gross per 24 hour  Intake 480 ml  Output 125 ml  Net 355 ml   Filed Weights   08/07/23 1431 08/10/23 0810 08/10/23 1245  Weight: 58.1 kg 60.3 kg 59.2 kg    Exam:   General: Appearance:     Overweight female in no acute distress     Lungs:     respirations unlabored  Heart:    Normal heart rate. Normal rhythm. No murmurs, rubs, or gallops.   MS:   All extremities are intact.   Neurologic:   Awake, alert    Data Reviewed: I have reviewed the patient's lab results since admission.  Pertinent  labs for today include:   Cr: 1.44 Hgb 9.1     Family Communication: None present  Disposition: Status is: Inpatient Remains inpatient appropriate because: awaiting placement   Time spent: 35 minutes   Author: Sai Moura U Amaziah Raisanen, DO 08/14/2023 9:05 AM  For on call review [email or web address].

## 2023-08-15 DIAGNOSIS — R197 Diarrhea, unspecified: Secondary | ICD-10-CM | POA: Diagnosis not present

## 2023-08-15 DIAGNOSIS — R112 Nausea with vomiting, unspecified: Secondary | ICD-10-CM | POA: Diagnosis not present

## 2023-08-15 LAB — CBC
HCT: 29.2 % — ABNORMAL LOW (ref 36.0–46.0)
Hemoglobin: 9.6 g/dL — ABNORMAL LOW (ref 12.0–15.0)
MCH: 30.4 pg (ref 26.0–34.0)
MCHC: 32.9 g/dL (ref 30.0–36.0)
MCV: 92.4 fL (ref 80.0–100.0)
Platelets: 270 10*3/uL (ref 150–400)
RBC: 3.16 MIL/uL — ABNORMAL LOW (ref 3.87–5.11)
RDW: 13.8 % (ref 11.5–15.5)
WBC: 10.6 10*3/uL — ABNORMAL HIGH (ref 4.0–10.5)
nRBC: 0 % (ref 0.0–0.2)

## 2023-08-15 LAB — BASIC METABOLIC PANEL
Anion gap: 7 (ref 5–15)
BUN: 15 mg/dL (ref 6–20)
CO2: 25 mmol/L (ref 22–32)
Calcium: 9.8 mg/dL (ref 8.9–10.3)
Chloride: 107 mmol/L (ref 98–111)
Creatinine, Ser: 1.31 mg/dL — ABNORMAL HIGH (ref 0.44–1.00)
GFR, Estimated: 47 mL/min — ABNORMAL LOW (ref >60)
Glucose, Bld: 114 mg/dL — ABNORMAL HIGH (ref 70–99)
Potassium: 4.9 mmol/L (ref 3.5–5.1)
Sodium: 139 mmol/L (ref 135–145)

## 2023-08-15 LAB — GLUCOSE, CAPILLARY
Glucose-Capillary: 116 mg/dL — ABNORMAL HIGH (ref 70–99)
Glucose-Capillary: 158 mg/dL — ABNORMAL HIGH (ref 70–99)
Glucose-Capillary: 118 mg/dL — ABNORMAL HIGH (ref 70–99)
Glucose-Capillary: 129 mg/dL — ABNORMAL HIGH (ref 70–99)

## 2023-08-15 NOTE — Progress Notes (Signed)
 [facility]  PROGRESS NOTE   Subjective:   Patient is awake and alert. She finished her 24-hour urine collection. Denies any chest pain or shortness of breath.   Objective:  Vital signs: Blood pressure (!) 106/59, pulse 92, temperature 98.9 F (37.2 C), resp. rate 18, height 5' (1.524 m), weight 59.2 kg, SpO2 98%. No intake or output data in the 24 hours ending 08/15/23 1506 Filed Weights   08/07/23 1431 08/10/23 0810 08/10/23 1245  Weight: 58.1 kg 60.3 kg 59.2 kg     Physical Exam: General:  No acute distress  Head:  Normocephalic, atraumatic. Moist oral mucosal membranes  Eyes:  Anicteric  Neck:  Supple  Lungs:   Clear to auscultation, normal effort  Heart:  S1S2 no rubs  Abdomen:   Soft, nontender, bowel sounds present  Extremities:  peripheral edema.  Neurologic:  Awake, alert, following commands  Skin:  No lesions  Access:     Basic Metabolic Panel: Recent Labs  Lab 08/10/23 0846 08/11/23 0428 08/13/23 0456 08/14/23 0359 08/15/23 0529  NA 140 135 136 139 139  K 4.6 3.8 4.1 4.1 4.9  CL 109 102 103 109 107  CO2 24 24 27 25 25   GLUCOSE 125* 114* 104* 134* 114*  BUN 13 7 12 14 15   CREATININE 1.07* 0.97 1.44* 1.44* 1.31*  CALCIUM  9.7 8.9 9.5 9.4 9.8   GFR: Estimated Creatinine Clearance: 37.2 mL/min (A) (by C-G formula based on SCr of 1.31 mg/dL (H)).  Liver Function Tests: No results for input(s): AST, ALT, ALKPHOS, BILITOT, PROT, ALBUMIN in the last 168 hours. No results for input(s): LIPASE, AMYLASE in the last 168 hours. No results for input(s): AMMONIA in the last 168 hours.  CBC: Recent Labs  Lab 08/10/23 0846 08/11/23 0428 08/13/23 0456 08/14/23 0359 08/15/23 0529  WBC 8.2 8.0 9.3 8.8 10.6*  NEUTROABS 4.5 4.3 4.7 4.5  --   HGB 10.1* 10.4* 9.3* 9.1* 9.6*  HCT 30.2* 31.1* 28.1* 27.5* 29.2*  MCV 92.6 89.6 90.6 93.5 92.4  PLT 269 268 249 240 270     HbA1C: Hgb A1c MFr Bld  Date/Time Value Ref Range Status   03/31/2023 02:49 PM 8.2 (H) 4.8 - 5.6 % Final    Comment:    (NOTE) Pre diabetes:          5.7%-6.4%  Diabetes:              >6.4%  Glycemic control for   <7.0% adults with diabetes   12/31/2014 09:08 PM 6.9 (H) 4.8 - 5.6 % Final    Comment:    (NOTE)         Pre-diabetes: 5.7 - 6.4         Diabetes: >6.4         Glycemic control for adults with diabetes: <7.0     Urinalysis: No results for input(s): COLORURINE, LABSPEC, PHURINE, GLUCOSEU, HGBUR, BILIRUBINUR, KETONESUR, PROTEINUR, UROBILINOGEN, NITRITE, LEUKOCYTESUR in the last 72 hours.  Invalid input(s): APPERANCEUR    Imaging: No results found.   Medications:     acidophilus  1 capsule Oral Daily   aspirin   81 mg Oral Daily   atorvastatin   80 mg Oral Daily   Chlorhexidine  Gluconate Cloth  6 each Topical Q0600   feeding supplement (NEPRO CARB STEADY)  237 mL Oral BID BM   heparin  injection (subcutaneous)  5,000 Units Subcutaneous Q8H   insulin  aspart  0-5 Units Subcutaneous QHS   insulin  aspart  0-9 Units  Subcutaneous TID WC   metoprolol  succinate  12.5 mg Oral Daily   multivitamin  1 tablet Oral QHS   mupirocin  ointment  1 Application Nasal BID   pantoprazole   40 mg Oral Daily   polyethylene glycol  17 g Oral Daily   psyllium  1 packet Oral Daily   sulfamethoxazole -trimethoprim   1 tablet Oral Q12H    Assessment/ Plan:     58 yo with h/o asthma, DM, HTN, PSVT, seizure d/o, ESRD recently started on HD, and ovarian CA s/p TAH/BSO who presented on 12/28 with n/v/d.  Now with acute gastroenteritis and urinary tract infection..     #1: Acute kidney injury on chronic kidney disease: Patient had initially anuric renal failure and was initiated on dialysis.  Presently on twice a week dialysis.  She had 24-hour urine studies done.  Her creatinine is 1.3 and she is asymptomatic.  Will hold dialysis and monitor closely.   #2: Urinary tract infection: Patient has been on Bactrim  which include  sulfamethoxazole  and trimethoprim .  Will need to monitor her potassium levels closely along with BUN.   #3: Hyperlipidemia: Patient has been on very high doses of atorvastatin  which may need to be adjusted.   #4: Diabetes: Continue insulin  as ordered.  Labs and medications reviewed. Will continue to follow along with you.   LOS: 7 Pinkey Edman, MD [facility] 1/5/20253:06 PM

## 2023-08-15 NOTE — Progress Notes (Signed)
 This nurse is recommending a psych consult to assess for signs of depression for the pt. Pt has had changes in mood at relatively frequent intervals.

## 2023-08-15 NOTE — Plan of Care (Signed)
 Problem: Fluid Volume: Goal: Hemodynamic stability will improve 08/15/2023 0405 by Sarah Lane LABOR, RN Outcome: Progressing 08/15/2023 0405 by Sarah Lane LABOR, RN Outcome: Progressing 08/15/2023 0405 by Sarah Lane A, RN Outcome: Progressing   Problem: Clinical Measurements: Goal: Diagnostic test results will improve 08/15/2023 0405 by Sarah Lane LABOR, RN Outcome: Progressing 08/15/2023 0405 by Sarah Lane LABOR, RN Outcome: Progressing 08/15/2023 0405 by Sarah Lane A, RN Outcome: Progressing Goal: Signs and symptoms of infection will decrease 08/15/2023 0405 by Sarah Lane LABOR, RN Outcome: Progressing 08/15/2023 0405 by Sarah Lane LABOR, RN Outcome: Progressing 08/15/2023 0405 by Sarah Lane A, RN Outcome: Progressing   Problem: Respiratory: Goal: Ability to maintain adequate ventilation will improve 08/15/2023 0405 by Sarah Lane LABOR, RN Outcome: Progressing 08/15/2023 0405 by Sarah Lane LABOR, RN Outcome: Progressing 08/15/2023 0405 by Sarah Lane A, RN Outcome: Progressing   Problem: Education: Goal: Ability to describe self-care measures that may prevent or decrease complications (Diabetes Survival Skills Education) will improve 08/15/2023 0405 by Sarah Lane LABOR, RN Outcome: Progressing 08/15/2023 0405 by Sarah Lane LABOR, RN Outcome: Progressing 08/15/2023 0405 by Sarah Lane LABOR, RN Outcome: Progressing Goal: Individualized Educational Video(s) 08/15/2023 0405 by Sarah Lane LABOR, RN Outcome: Progressing 08/15/2023 0405 by Sarah Lane LABOR, RN Outcome: Progressing 08/15/2023 0405 by Sarah Lane A, RN Outcome: Progressing   Problem: Coping: Goal: Ability to adjust to condition or change in health will improve 08/15/2023 0405 by Sarah Lane LABOR, RN Outcome: Progressing 08/15/2023 0405 by Sarah Lane LABOR, RN Outcome: Progressing 08/15/2023 0405 by Sarah Lane A, RN Outcome: Progressing   Problem: Fluid  Volume: Goal: Ability to maintain a balanced intake and output will improve 08/15/2023 0405 by Sarah Lane LABOR, RN Outcome: Progressing 08/15/2023 0405 by Sarah Lane LABOR, RN Outcome: Progressing 08/15/2023 0405 by Sarah Lane LABOR, RN Outcome: Progressing   Problem: Health Behavior/Discharge Planning: Goal: Ability to identify and utilize available resources and services will improve 08/15/2023 0405 by Sarah Lane LABOR, RN Outcome: Progressing 08/15/2023 0405 by Sarah Lane LABOR, RN Outcome: Progressing 08/15/2023 0405 by Sarah Lane A, RN Outcome: Progressing Goal: Ability to manage health-related needs will improve 08/15/2023 0405 by Sarah Lane LABOR, RN Outcome: Progressing 08/15/2023 0405 by Sarah Lane LABOR, RN Outcome: Progressing 08/15/2023 0405 by Sarah Lane A, RN Outcome: Progressing   Problem: Metabolic: Goal: Ability to maintain appropriate glucose levels will improve 08/15/2023 0405 by Sarah Lane LABOR, RN Outcome: Progressing 08/15/2023 0405 by Sarah Lane LABOR, RN Outcome: Progressing 08/15/2023 0405 by Sarah Lane A, RN Outcome: Progressing   Problem: Nutritional: Goal: Maintenance of adequate nutrition will improve 08/15/2023 0405 by Sarah Lane LABOR, RN Outcome: Progressing 08/15/2023 0405 by Sarah Lane LABOR, RN Outcome: Progressing 08/15/2023 0405 by Sarah Lane A, RN Outcome: Progressing Goal: Progress toward achieving an optimal weight will improve 08/15/2023 0405 by Sarah Lane LABOR, RN Outcome: Progressing 08/15/2023 0405 by Sarah Lane LABOR, RN Outcome: Progressing 08/15/2023 0405 by Sarah Lane A, RN Outcome: Progressing   Problem: Skin Integrity: Goal: Risk for impaired skin integrity will decrease 08/15/2023 0405 by Sarah Lane LABOR, RN Outcome: Progressing 08/15/2023 0405 by Sarah Lane LABOR, RN Outcome: Progressing 08/15/2023 0405 by Sarah Lane A, RN Outcome: Progressing   Problem:  Tissue Perfusion: Goal: Adequacy of tissue perfusion will improve 08/15/2023 0405 by Sarah Lane LABOR, RN Outcome: Progressing 08/15/2023 0405 by Sarah Lane LABOR, RN Outcome: Progressing 08/15/2023 0405 by Sarah Lane LABOR, RN Outcome: Progressing   Problem: Education: Goal: Knowledge of General Education information will improve  Description: Including pain rating scale, medication(s)/side effects and non-pharmacologic comfort measures 08/15/2023 0405 by Sarah Lane LABOR, RN Outcome: Progressing 08/15/2023 0405 by Sarah Lane LABOR, RN Outcome: Progressing 08/15/2023 0405 by Sarah Lane LABOR, RN Outcome: Progressing   Problem: Health Behavior/Discharge Planning: Goal: Ability to manage health-related needs will improve 08/15/2023 0405 by Sarah Lane LABOR, RN Outcome: Progressing 08/15/2023 0405 by Sarah Lane LABOR, RN Outcome: Progressing 08/15/2023 0405 by Sarah Lane LABOR, RN Outcome: Progressing   Problem: Clinical Measurements: Goal: Ability to maintain clinical measurements within normal limits will improve 08/15/2023 0405 by Sarah Lane LABOR, RN Outcome: Progressing 08/15/2023 0405 by Sarah Lane LABOR, RN Outcome: Progressing 08/15/2023 0405 by Sarah Lane LABOR, RN Outcome: Progressing Goal: Will remain free from infection 08/15/2023 0405 by Sarah Lane LABOR, RN Outcome: Progressing 08/15/2023 0405 by Sarah Lane LABOR, RN Outcome: Progressing 08/15/2023 0405 by Sarah Lane LABOR, RN Outcome: Progressing Goal: Diagnostic test results will improve 08/15/2023 0405 by Sarah Lane LABOR, RN Outcome: Progressing 08/15/2023 0405 by Sarah Lane LABOR, RN Outcome: Progressing 08/15/2023 0405 by Sarah Lane LABOR, RN Outcome: Progressing Goal: Respiratory complications will improve 08/15/2023 0405 by Sarah Lane LABOR, RN Outcome: Progressing 08/15/2023 0405 by Sarah Lane LABOR, RN Outcome: Progressing 08/15/2023 0405 by Sarah Lane LABOR,  RN Outcome: Progressing Goal: Cardiovascular complication will be avoided 08/15/2023 0405 by Sarah Lane LABOR, RN Outcome: Progressing 08/15/2023 0405 by Sarah Lane LABOR, RN Outcome: Progressing   Problem: Activity: Goal: Risk for activity intolerance will decrease 08/15/2023 0405 by Sarah Lane LABOR, RN Outcome: Progressing 08/15/2023 0405 by Sarah Lane A, RN Outcome: Progressing   Problem: Nutrition: Goal: Adequate nutrition will be maintained 08/15/2023 0405 by Sarah Lane LABOR, RN Outcome: Progressing 08/15/2023 0405 by Sarah Lane A, RN Outcome: Progressing   Problem: Coping: Goal: Level of anxiety will decrease 08/15/2023 0405 by Sarah Lane LABOR, RN Outcome: Progressing 08/15/2023 0405 by Sarah Lane A, RN Outcome: Progressing   Problem: Elimination: Goal: Will not experience complications related to bowel motility 08/15/2023 0405 by Sarah Lane LABOR, RN Outcome: Progressing 08/15/2023 0405 by Sarah Lane A, RN Outcome: Progressing Goal: Will not experience complications related to urinary retention 08/15/2023 0405 by Sarah Lane LABOR, RN Outcome: Progressing 08/15/2023 0405 by Sarah Lane A, RN Outcome: Progressing   Problem: Pain Management: Goal: General experience of comfort will improve 08/15/2023 0405 by Sarah Lane LABOR, RN Outcome: Progressing 08/15/2023 0405 by Sarah Lane A, RN Outcome: Progressing   Problem: Safety: Goal: Ability to remain free from injury will improve 08/15/2023 0405 by Sarah Lane LABOR, RN Outcome: Progressing 08/15/2023 0405 by Sarah Lane A, RN Outcome: Progressing   Problem: Skin Integrity: Goal: Risk for impaired skin integrity will decrease 08/15/2023 0405 by Sarah Lane LABOR, RN Outcome: Progressing 08/15/2023 0405 by Sarah Lane LABOR, RN Outcome: Progressing

## 2023-08-15 NOTE — Plan of Care (Signed)

## 2023-08-15 NOTE — Progress Notes (Signed)
 Progress Note   Patient: Sarah Lane FMW:[id number] DOB: 01-13-1966 DOA: 08/07/2023     7 DOS: the patient was seen and examined on 08/15/2023   Brief hospital course: 58yo with h/o asthma, DM, HTN, PSVT, seizure d/o, ESRD recently started on HD, and ovarian CA s/p TAH/BSO who presented on 12/28 with n/v/d.  UA with >100k colonies Staph aureus.    Assessment and Plan:  N/V/D Patient presented with 1 week of n/v/d Imaging with dilated rectum and concern for stercoral colitis She was started on Ceftriaxone  and metronidazole  -> Bactrim  C diff negative -bowel care/disimpaction--- Needs a good bowel regimen Continue Miralax , metamucil, dulcolax    UTI Sepsis ruled out UA with >100k colonies Staph aureus, MRSA Started on ceftriaxone  but  changed to Bactrim  per pharmacy Blood cultures NTD Dr. Fayette consulted-Pt is on bactrim  -complete 7 days. No need to repeat urine culture after that    Recent onset ESRD Stage 3b CKD on admission in 03/2023, bordering on stage 4 Cr 5.85 on admission on 11/29 with anuria not responsive to IVF Vascular surgery consulted for HD access during that admission, s/p Right IJ tunneled dialysis catheter  Initiated inpatient -> outpatient HD  She reports that she is now requiring only twice weekly HD Continue Renavite Per nephrology-- PRN HD   DM Recent A1c was 5.3 She is not requiring medications for this issue currently   Dyslipidemia Continue Lipitor    Essential hypertension Continue Toprol -XL   GERD without esophagitis On PPI   Chronic HFrEF (heart failure with reduced ejection fraction)  Echo on 8/22 with EF 30-35%, grade 1 diastolic dysfunction Appears compensated Volume control with HD   CAD R/LHC on 04/06/23 with severe single-vessel CAD with occlusion to vessel too small and distal for PCI Medication management   Nutrition Status: Nutrition Problem: Moderate Malnutrition Etiology: social / environmental  circumstances Signs/Symptoms: percent weight loss, mild fat depletion, moderate fat depletion, mild muscle depletion, moderate muscle depletion Interventions: MVI, Nepro shake    Other Appears likely to need additional housing, as her roommate situation is not working out per patient report PT/OT evaluated and are recommending SNF rehab She will also need assistance with housing resources post-rehab She is willing to give up her monthly check in order to make a facility happen and is awaiting placement at this time       Consultants: ID PT/OT Nutrition TOC team      Antibiotics: Ceftriaxone  12/29-31 Bactrim  12/31-1/6      Subjective: upset her bed has not been changed yet   Objective: Vitals:   08/15/23 0039 08/15/23 0753  BP: (!) 115/53 116/69  Pulse: 72 78  Resp: 18 19  Temp: 97.8 F (36.6 C) 98.2 F (36.8 C)  SpO2: 99% 99%    Intake/Output Summary (Last 24 hours) at 08/15/2023 1007 Last data filed at 08/14/2023 1450 Gross per 24 hour  Intake 480 ml  Output --  Net 480 ml   Filed Weights   08/07/23 1431 08/10/23 0810 08/10/23 1245  Weight: 58.1 kg 60.3 kg 59.2 kg    Exam:    General: Appearance:     Overweight female in no acute distress     Lungs:     respirations unlabored  Heart:    Normal heart rate. .   MS:   All extremities are intact.   Neurologic:   Awake, alert      Data Reviewed: I have reviewed the patient's lab results since admission.  Pertinent labs for  today include:   Cr: 1.31 Hgb 9.6     Family Communication: None present  Disposition: Status is: Inpatient Remains inpatient appropriate because: awaiting placement   Time spent: 35 minutes   Author: Jacqualine Weichel U Elisabel Hanover, DO 08/15/2023 10:07 AM  For on call review [email or web address].

## 2023-08-16 DIAGNOSIS — R112 Nausea with vomiting, unspecified: Secondary | ICD-10-CM | POA: Diagnosis not present

## 2023-08-16 DIAGNOSIS — R197 Diarrhea, unspecified: Secondary | ICD-10-CM | POA: Diagnosis not present

## 2023-08-16 LAB — GLUCOSE, CAPILLARY
Glucose-Capillary: 115 mg/dL — ABNORMAL HIGH (ref 70–99)
Glucose-Capillary: 143 mg/dL — ABNORMAL HIGH (ref 70–99)
Glucose-Capillary: 144 mg/dL — ABNORMAL HIGH (ref 70–99)
Glucose-Capillary: 141 mg/dL — ABNORMAL HIGH (ref 70–99)

## 2023-08-16 LAB — BASIC METABOLIC PANEL
Anion gap: 8 (ref 5–15)
BUN: 17 mg/dL (ref 6–20)
CO2: 24 mmol/L (ref 22–32)
Calcium: 9.5 mg/dL (ref 8.9–10.3)
Chloride: 107 mmol/L (ref 98–111)
Creatinine, Ser: 1.38 mg/dL — ABNORMAL HIGH (ref 0.44–1.00)
GFR, Estimated: 44 mL/min — ABNORMAL LOW (ref >60)
Glucose, Bld: 107 mg/dL — ABNORMAL HIGH (ref 70–99)
Potassium: 4.6 mmol/L (ref 3.5–5.1)
Sodium: 139 mmol/L (ref 135–145)

## 2023-08-16 LAB — CBC
HCT: 26.2 % — ABNORMAL LOW (ref 36.0–46.0)
Hemoglobin: 8.8 g/dL — ABNORMAL LOW (ref 12.0–15.0)
MCH: 30.4 pg (ref 26.0–34.0)
MCHC: 33.6 g/dL (ref 30.0–36.0)
MCV: 90.7 fL (ref 80.0–100.0)
Platelets: 255 10*3/uL (ref 150–400)
RBC: 2.89 MIL/uL — ABNORMAL LOW (ref 3.87–5.11)
RDW: 13.7 % (ref 11.5–15.5)
WBC: 10.7 10*3/uL — ABNORMAL HIGH (ref 4.0–10.5)
nRBC: 0 % (ref 0.0–0.2)

## 2023-08-16 MED ORDER — RISAQUAD PO CAPS: 1.0000 | ORAL_CAPSULE | Freq: Every day | ORAL | Status: AC

## 2023-08-16 MED ORDER — PSYLLIUM 95 % PO PACK: 1.0000 | PACK | Freq: Every day | ORAL | Status: AC

## 2023-08-16 MED ORDER — POLYETHYLENE GLYCOL 3350 17 G PO PACK: 17.0000 g | PACK | Freq: Every day | ORAL | Status: AC

## 2023-08-16 NOTE — Progress Notes (Signed)
 Occupational Therapy Treatment Patient Details Name: Sarah Lane MRN: [id number] DOB: 03-16-1966 Today's Date: 08/16/2023   History of present illness Pt is a 58 y/o female admitted secondary to nausea, vomiting and diahrrea. Pt found to have a UTI and sepsis. 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   OT comments  Ms Hoefer was seen for OT treatment on this date. Upon arrival to room pt in bed, agreeable to tx. Pt requires MIN A + RW for ADL t/f >100 ft requires multiple standing rest breaks. MIN A don gown in standing, mild LOB requiring MIN A to correct. Pt making good progress toward goals, will continue to follow POC. Discharge recommendation remains appropriate.        If plan is discharge home, recommend the following:  A little help with bathing/dressing/bathroom;Assistance with cooking/housework;Help with stairs or ramp for entrance;Assist for transportation;A little help with walking and/or transfers   Equipment Recommendations  BSC/3in1;Tub/shower seat    Recommendations for Other Services      Precautions / Restrictions Precautions Precautions: Fall Restrictions Weight Bearing Restrictions Per Provider Order: No       Mobility Bed Mobility Overal bed mobility: Modified Independent                  Transfers Overall transfer level: Needs assistance Equipment used: Rolling walker (2 wheels) Transfers: Sit to/from Stand Sit to Stand: Min assist                 Balance Overall balance assessment: Needs assistance Sitting-balance support: No upper extremity supported, Feet supported Sitting balance-Leahy Scale: Good     Standing balance support: No upper extremity supported, During functional activity Standing balance-Leahy Scale: Poor                             ADL either performed or assessed with clinical judgement    ADL Overall ADL's : Needs assistance/impaired                                       General ADL Comments: MIN A don gown in standing, mild LOB requiring MIN A to correct.      Cognition Arousal: Alert Behavior During Therapy: WFL for tasks assessed/performed, Agitated Overall Cognitive Status: No family/caregiver present to determine baseline cognitive functioning                                 General Comments: reports distress over delayed response to call bell this AM                   Pertinent Vitals/ Pain       Pain Assessment Pain Assessment: No/denies pain   Frequency  Min 1X/week        Progress Toward Goals  OT Goals(current goals can now be found in the care plan section)  Progress towards OT goals: Progressing toward goals  Acute Rehab OT Goals OT Goal Formulation: With patient Time For Goal Achievement: 08/23/23 Potential to Achieve Goals: Good ADL Goals Pt Will Perform Upper Body Dressing: sitting;with modified independence Pt Will Perform Lower Body Dressing: sit to/from stand;with supervision Pt Will Transfer to  Toilet: ambulating;with supervision Pt Will Perform Toileting - Clothing Manipulation and hygiene: with modified independence Pt/caregiver will Perform Home Exercise Program: Increased strength;With written HEP provided;Right Upper extremity Additional ADL Goal #1: Pt will verbalize plan to implement at least 1 learned falls prevention strategy to maximize safety.  Plan      Co-evaluation                 AM-PAC OT 6 Clicks Daily Activity     Outcome Measure   Help from another person eating meals?: None Help from another person taking care of personal grooming?: A Little Help from another person toileting, which includes using toliet, bedpan, or urinal?: A Little Help from another person bathing (including washing, rinsing, drying)?: A Little Help from another person to put on and taking  off regular upper body clothing?: A Little Help from another person to put on and taking off regular lower body clothing?: A Little 6 Click Score: 19    End of Session Equipment Utilized During Treatment: Gait belt;Rolling walker (2 wheels)  OT Visit Diagnosis: Other abnormalities of gait and mobility (R26.89);Muscle weakness (generalized) (M62.81)   Activity Tolerance Patient tolerated treatment well   Patient Left in bed;with call bell/phone within reach;with bed alarm set   Nurse Communication          Time: 9097-9077 OT Time Calculation (min): 20 min  Charges: OT General Charges $OT Visit: 1 Visit OT Treatments $Therapeutic Activity: 8-22 mins  Elston Slot, M.S. OTR/L  08/16/23, 9:31 AM  ascom [phone]

## 2023-08-16 NOTE — Discharge Summary (Addendum)
 Physician Discharge Summary   Patient: Sarah Lane MRN: [id number] DOB: [birth year]  Admit date:     08/07/2023  Discharge date: 08/17/2023  Discharge Physician: Delon Herald   PCP: [facility]   Recommendations at discharge:   You are being discharged to [facility] in [place] for rehabilitation and possibly long-term care Continue bowel regimen with Metamucil and Miralax  daily Continue to monitor kidney function and urine output, as dialysis may be needed again in the future  Discharge Diagnoses: Principal Problem:   Nausea vomiting and diarrhea Active Problems:   Essential hypertension   Hypokalemia   Uncontrolled type 2 diabetes mellitus with hyperglycemia, without long-term current use of insulin  (HCC)   Dyslipidemia   GERD without esophagitis   UTI (urinary tract infection)   ESRD on dialysis (HCC)   Malnutrition of moderate degree    Hospital Course: 58yo with h/o asthma, DM, HTN, PSVT, seizure d/o, ESRD recently started on HD, and ovarian CA s/p TAH/BSO who presented on 12/28 with n/v/d.  UA with >100k colonies Staph aureus.   Assessment and Plan:  N/V/D Patient presented with 1 week of n/v/d Imaging with dilated rectum and concern for stercoral colitis She was started on Ceftriaxone  and metronidazole  -> Bactrim  C diff negative Needs a good bowel regimen Overflow diarrhea appears to have resolved Continue Miralax , metamucil   UTI Sepsis ruled out UA with >100k colonies Staph aureus, MRSA Started on ceftriaxone  -> Bactrim   Blood cultures NTD Dr. Fayette recommended 7 days of treatment and no further testing   Recent onset ESRD Stage 3b CKD on admission in 03/2023, bordering on stage 4 Cr 5.85 on admission on 11/29 with anuria not responsive to IVF Vascular surgery consulted for HD access during that admission, s/p Right IJ tunneled dialysis catheter  Initiated inpatient -> outpatient HD  Requiring only twice weekly HD at the time of  admission Continue Renavite Per nephrology, she may be making sufficient urine for renal recovery; completed 24 hour urine studies Holding HD in the interim Will need close ongoing monitoring as an outpatient without current plan to resume HD   DM Recent A1c was 5.3 She is not requiring medications for this issue currently   Dyslipidemia Continue Lipitor    Essential hypertension Continue Toprol -XL   GERD without esophagitis On PPI   Chronic HFrEF (heart failure with reduced ejection fraction)  Echo on 8/22 with EF 30-35%, grade 1 diastolic dysfunction Appears compensated   CAD R/LHC on 04/06/23 with severe single-vessel CAD with occlusion to vessel too small and distal for PCI Medication management   Nutrition Status: Nutrition Problem: Moderate Malnutrition Etiology: social / environmental circumstances Signs/Symptoms: percent weight loss, mild fat depletion, moderate fat depletion, mild muscle depletion, moderate muscle depletion Interventions: MVI, Nepro shake    Other Appears likely to need additional housing, as her roommate situation is not working out per patient report PT/OT evaluated and are recommending SNF rehab She will also need assistance with housing resources post-rehab She is willing to give up her monthly check in order to make a facility happen and is awaiting placement at this time and is medically stable for ds to SNF in [place]       Consultants: ID PT OT Nutrition TOC team   Procedures: None   Antibiotics: Ceftriaxone  12/29-31 Bactrim  12/31-1/6       Pain control - [place]  Controlled Substance Reporting System database was reviewed. and patient was instructed, not to drive, operate heavy machinery, perform activities at heights, swimming  or participation in water activities or provide baby-sitting services while on Pain, Sleep and Anxiety Medications; until their outpatient Physician has advised to do so again. Also  recommended to not to take more than prescribed Pain, Sleep and Anxiety Medications.   Disposition: Skilled nursing facility Diet recommendation:  Renal diet DISCHARGE MEDICATION: Allergies as of 08/16/2023       Reactions   Jardiance [empagliflozin]    Multiple side effect complications including: DKA, pancreatitis, renal failure. Avoid re-prescribing   Codeine Nausea And Vomiting   Ibuprofen Nausea And Vomiting        Medication List     TAKE these medications    acidophilus Caps capsule Take 1 capsule by mouth daily. Start taking on: August 17, 2023   aspirin  81 MG chewable tablet Chew 81 mg by mouth in the morning.   atorvastatin  80 MG tablet Commonly known as: LIPITOR  Take 80 mg by mouth in the morning.   feeding supplement Liqd Take 237 mLs by mouth 2 (two) times daily between meals.   metoprolol  succinate 25 MG 24 hr tablet Commonly known as: TOPROL -XL Take 12.5 mg by mouth in the morning.   multivitamin Tabs tablet Take 1 tablet by mouth at bedtime.   omeprazole  40 MG capsule Commonly known as: PRILOSEC Take 1 capsule (40 mg total) by mouth daily.   polyethylene glycol 17 g packet Commonly known as: MIRALAX  / GLYCOLAX  Take 17 g by mouth daily. Start taking on: August 17, 2023   psyllium 95 % Pack Commonly known as: HYDROCIL/METAMUCIL Take 1 packet by mouth daily. Start taking on: August 17, 2023        Contact information for after-discharge care     Destination     [facility] .   Service: Skilled Nursing Contact information: [address] [place]  [address] [phone]                    Discharge Exam:   Subjective: Feeling ok.  Very excited about possible dc to SNF soon.   Objective: Vitals:   08/16/23 0157 08/16/23 0837  BP: 114/65 (!) 143/67  Pulse: 78 71  Resp: 20 17  Temp: 98.1 F (36.7 C) 98 F (36.7 C)  SpO2: 97% 97%    Intake/Output Summary (Last 24  hours) at 08/16/2023 1249 Last data filed at 08/16/2023 1035 Gross per 24 hour  Intake 0 ml  Output --  Net 0 ml   Filed Weights   08/07/23 1431 08/10/23 0810 08/10/23 1245  Weight: 58.1 kg 60.3 kg 59.2 kg    Exam:  General:  Appears calm and comfortable and is in NAD Eyes:  EOMI, normal lids, iris ENT:  grossly normal hearing, lips & tongue, mmm Neck:  no LAD, masses or thyromegaly Cardiovascular:  RRR. No LE edema.  Respiratory:   CTA bilaterally with no wheezes/rales/rhonchi.  Normal respiratory effort. Abdomen:  soft, NT, ND Skin:  no rash or induration seen on limited exam Musculoskeletal:  grossly normal tone BUE/BLE, good ROM, no bony abnormality Psychiatric:  pleasant mood and affect, speech appropriate but with some hesitation, AOx3 Neurologic:  CN 2-12 grossly intact, moves all extremities in coordinated fashion  Data Reviewed: I have reviewed the patient's lab results since admission.  Pertinent labs for today include:  BUN 17/Creatinine 1.38/GFR 44 WBC 10.7 Hgb 8.8     Condition at discharge: stable  The results of significant diagnostics from this hospitalization (including imaging, microbiology, ancillary  and laboratory) are listed below for reference.   Imaging Studies: CT ABDOMEN PELVIS W CONTRAST Result Date: 08/07/2023 CLINICAL DATA:  Two day history of nausea, vomiting, and diarrhea EXAM: CT ABDOMEN AND PELVIS WITH CONTRAST TECHNIQUE: Multidetector CT imaging of the abdomen and pelvis was performed using the standard protocol following bolus administration of intravenous contrast. RADIATION DOSE REDUCTION: This exam was performed according to the departmental dose-optimization program which includes automated exposure control, adjustment of the mA and/or kV according to patient size and/or use of iterative reconstruction technique. CONTRAST:  50mL OMNIPAQUE  IOHEXOL  350 MG/ML SOLN COMPARISON:  CT abdomen and pelvis dated 07/09/2023 and multiple priors including  nuclear medicine PET dated 01/16/2020 FINDINGS: Lower chest: Partially imaged central venous catheter tip terminates in the right atrium. Unchanged non radiotracer avid 1.4 x 1.3 cm right lower lobe nodule. No pleural effusion or pneumothorax demonstrated. Partially imaged heart size is normal. Coronary artery calcifications. Hepatobiliary: No focal hepatic lesions. No intra or extrahepatic biliary ductal dilation. Cholecystectomy. Pancreas: No focal lesions or main ductal dilation. Spleen: Normal in size without focal abnormality. Adrenals/Urinary Tract: No adrenal nodules. No suspicious renal mass, calculi or hydronephrosis. Mild circumferential mural thickening of the urinary bladder. Stomach/Bowel: Normal appearance of the stomach. Mildly dilated rectum contains large volume stool and demonstrates mild circumferential mural thickening. Normal appendix. Vascular/Lymphatic: Aortic atherosclerosis. No enlarged abdominal or pelvic lymph nodes. Reproductive: No adnexal masses. Other: Mild presacral edema.  No free air or fluid collection. Musculoskeletal: No acute or abnormal lytic or blastic osseous lesions. Multilevel degenerative changes of the partially imaged thoracic and lumbar spine. Unchanged anterior wedging of T12 and L1 and superior endplate deformity of T11. Small paraumbilical hernia contains fat and a loop of nonobstructed small bowel. IMPRESSION: 1. Mildly dilated rectum contains large volume stool and demonstrates mild circumferential mural thickening, which may be seen in the setting of stercoral colitis. 2. Mild circumferential mural thickening of the urinary bladder, which may be seen in the setting of cystitis. Recommend correlation with urinalysis. 3. Aortic Atherosclerosis (ICD10-I70.0). Coronary artery calcifications. Assessment for potential risk factor modification, dietary therapy or pharmacologic therapy may be warranted, if clinically indicated. Electronically Signed   By: Limin  Xu M.D.    On: 08/07/2023 19:33    Microbiology: Results for orders placed or performed during the hospital encounter of 08/07/23  C Difficile Quick Screen w PCR reflex     Status: None   Collection Time: 08/07/23  6:59 PM   Specimen: STOOL  Result Value Ref Range Status   C Diff antigen NEGATIVE NEGATIVE Final   C Diff toxin NEGATIVE NEGATIVE Final   C Diff interpretation No C. difficile detected.  Final    Comment: Performed at [facility], [address] [place], [place] [address]  Urine Culture (for pregnant, neutropenic or urologic patients or patients with an indwelling urinary catheter)     Status: Abnormal   Collection Time: 08/07/23  8:20 PM   Specimen: Urine, Catheterized  Result Value Ref Range Status   Specimen Description   Final    URINE, CATHETERIZED Performed at [facility], [address] [place], [place] [address]    Special Requests   Final    NONE Performed at [facility], [address] [place], [place] [address]    Culture (A)  Final    >=100,000 COLONIES/mL METHICILLIN RESISTANT STAPHYLOCOCCUS AUREUS   Report Status 08/10/2023 FINAL  Final   Organism ID, Bacteria METHICILLIN RESISTANT STAPHYLOCOCCUS AUREUS (A)  Final      Susceptibility   Methicillin resistant staphylococcus aureus - MIC*    CIPROFLOXACIN >=8 RESISTANT Resistant     GENTAMICIN <=0.5 SENSITIVE Sensitive     NITROFURANTOIN <=16 SENSITIVE Sensitive     OXACILLIN >=4 RESISTANT Resistant     TETRACYCLINE <=1 SENSITIVE Sensitive     VANCOMYCIN  1 SENSITIVE Sensitive     TRIMETH /SULFA  <=10 SENSITIVE Sensitive     RIFAMPIN <=0.5 SENSITIVE Sensitive     Inducible Clindamycin NEGATIVE Sensitive     LINEZOLID 2 SENSITIVE Sensitive     * >=100,000 COLONIES/mL METHICILLIN RESISTANT STAPHYLOCOCCUS AUREUS  Culture, blood (Routine X 2) w Reflex to ID Panel     Status: None   Collection Time: 08/07/23 11:29 PM   Specimen: BLOOD  Result Value Ref Range Status   Specimen  Description BLOOD BLOOD RIGHT ARM  Final   Special Requests   Final    BOTTLES DRAWN AEROBIC AND ANAEROBIC Blood Culture results may not be optimal due to an inadequate volume of blood received in culture bottles   Culture   Final    NO GROWTH 5 DAYS Performed at [facility], [address] [place], [place] [address]    Report Status 08/13/2023 FINAL  Final  Culture, blood (Routine X 2) w Reflex to ID Panel     Status: None   Collection Time: 08/07/23 11:29 PM   Specimen: BLOOD  Result Value Ref Range Status   Specimen Description BLOOD BLOOD LEFT ARM  Final   Special Requests   Final    BOTTLES DRAWN AEROBIC AND ANAEROBIC Blood Culture results may not be optimal due to an inadequate volume of blood received in culture bottles   Culture   Final    NO GROWTH 5 DAYS Performed at [facility], [address] [place], [place] [address]    Report Status 08/13/2023 FINAL  Final  MRSA Next Gen by PCR, Nasal     Status: Abnormal   Collection Time: 08/10/23  6:00 AM   Specimen: Nasal Mucosa; Nasal Swab  Result Value Ref Range Status   MRSA by PCR Next Gen DETECTED (A) NOT DETECTED Final    Comment: RESULT CALLED TO, READ BACK BY AND VERIFIED WITH: Ave, Katrina 08/10/23 0732 MW (NOTE) The GeneXpert MRSA Assay (FDA approved for NASAL specimens only), is one component of a comprehensive MRSA colonization surveillance program. It is not intended to diagnose MRSA infection nor to guide or monitor treatment for MRSA infections. Test performance is not FDA approved in patients less than 36 years old. Performed at [facility], [address] [place], [place] [address]     Labs: CBC: Recent Labs  Lab 08/10/23 [phone] 08/11/23 0428 08/13/23 0456 08/14/23 0359 08/15/23 0529 08/16/23 0511  WBC 8.2 8.0 9.3 8.8 10.6* 10.7*  NEUTROABS 4.5 4.3 4.7 4.5  --   --   HGB 10.1* 10.4* 9.3* 9.1* 9.6* 8.8*  HCT 30.2* 31.1* 28.1* 27.5* 29.2* 26.2*  MCV 92.6  89.6 90.6 93.5 92.4 90.7  PLT 269 268 249 240 270 255   Basic Metabolic Panel: Recent Labs  Lab 08/11/23 0428 08/13/23 0456 08/14/23 0359 08/15/23 0529 08/16/23 0511  NA 135 136 139 139 139  K 3.8 4.1 4.1 4.9 4.6  CL 102 103 109 107 107  CO2 24 27 25 25 24   GLUCOSE 114* 104* 134* 114* 107*  BUN 7 12 14 15 17   CREATININE 0.97 1.44* 1.44* 1.31* 1.38*  CALCIUM   8.9 9.5 9.4 9.8 9.5   Liver Function Tests: No results for input(s): AST, ALT, ALKPHOS, BILITOT, PROT, ALBUMIN in the last 168 hours. CBG: Recent Labs  Lab 08/15/23 1223 08/15/23 1741 08/15/23 2128 08/16/23 0839 08/16/23 1113  GLUCAP 158* 118* 129* 115* 143*    Discharge time spent: greater than 30 minutes.  Signed: Delon Herald, MD [facility] 08/16/2023

## 2023-08-16 NOTE — TOC Progression Note (Addendum)
 Transition of Care [facility]) - Progression Note    Patient Details  Name: Sarah Lane MRN: [id number] Date of Birth: [birth year]  Transition of [facility] CM/SW Contact  Lauraine JAYSON Carpen, LCSW Phone Number: 08/16/2023, 10:07 AM  Clinical Narrative:   CSW continues to follow progress.  2:44 pm: [facility] can accept patient once she is ready. Per nephrology, plan to remove perm cath tomorrow and patient will be stable after that. CSW asked nephrology to notify once time determined so transport can be arranged.  3:03 pm: EMS will check with crew chief to see if they can transport her to [facility] tomorrow afternoon.  3:15 pm: Received call back from EMS crew chief. Transport tentatively scheduled for 2:30. We can call if she is ready earlier than that. Crew chief will call back if there are any issues.  Expected Discharge Plan: Skilled Nursing Facility Barriers to Discharge: SNF Pending bed offer  Expected Discharge Plan and Services   Discharge Planning Services: CM Consult   Living arrangements for the past 2 months: Mobile Home                   DME Agency: NA                   Social Determinants of Health (SDOH) Interventions SDOH Screenings   Food Insecurity: Food Insecurity Present (08/09/2023)  Housing: Low Risk  (08/09/2023)  Recent Concern: Housing - Medium Risk (07/10/2023)  Transportation Needs: Unmet Transportation Needs (08/09/2023)  Utilities: Not At Risk (08/09/2023)  Financial Resource Strain: Medium Risk (05/17/2023)   Received from [facility]  Physical Activity: Insufficiently Active (05/17/2023)   Received from [facility]  Social Connections: Socially Isolated (05/18/2023)   Received from [facility]  Stress: No Stress Concern Present (05/18/2023)   [facility] from [facility]  Tobacco Use: Unknown (08/07/2023)  Health Literacy: Medium Risk (05/17/2023)   Received from [facility]    Readmission Risk Interventions     07/11/2023   11:51 AM  Readmission Risk Prevention Plan  Transportation Screening Complete  PCP or Specialist Appt within 5-7 Days Complete  Home Care Screening Complete  Medication Review (RN CM) Complete

## 2023-08-16 NOTE — Progress Notes (Signed)
 [facility]  ROUNDING NOTE   Subjective:   Sarah Lane  is a 58 y.o. female with a PMHx of diabetes mellitus type 2, seizure disorder, hypertension, dysrhythmia, and history of ovarian cancer.  Patient presents to the emergency department with nausea and vomiting. She has been admitted for Acute gastroenteritis [K52.9] UTI (urinary tract infection) [N39.0] ESRD on hemodialysis (HCC) [N18.6, Z99.2] Cystitis [N30.90] Stercoral colitis [K52.89]  Patient seen resting in bed No family present Alert and oriented Appetite appropriate States he is urinating frequently  Last dialysis treatment on 08/10/2023. Creatinine 1.38   Objective:  Vital signs in last 24 hours:  Temp:  [98 F (36.7 C)-98.1 F (36.7 C)] 98 F (36.7 C) (01/06 0837) Pulse Rate:  [71-78] 71 (01/06 0837) Resp:  [17-20] 17 (01/06 0837) BP: (114-143)/(65-67) 143/67 (01/06 0837) SpO2:  [97 %] 97 % (01/06 0837)  Weight change:  Filed Weights   08/07/23 1431 08/10/23 0810 08/10/23 1245  Weight: 58.1 kg 60.3 kg 59.2 kg    Intake/Output: No intake/output data recorded.   Intake/Output this shift:  No intake/output data recorded.  Physical Exam: General: NAD, laying in bed  Head: Normocephalic, atraumatic.  Dry oral mucosal membranes  Eyes: Anicteric  Lungs:  Clear to auscultation, normal effort  Heart: Regular rate and rhythm  Abdomen:  Soft, nontender, nondistended  Extremities: No peripheral edema.  Neurologic: Alert and oriented, moving all four extremities  Skin: No lesions  Access: Rt chest permcath    Basic Metabolic Panel: Recent Labs  Lab 08/11/23 0428 08/13/23 0456 08/14/23 0359 08/15/23 0529 08/16/23 0511  NA 135 136 139 139 139  K 3.8 4.1 4.1 4.9 4.6  CL 102 103 109 107 107  CO2 24 27 25 25 24   GLUCOSE 114* 104* 134* 114* 107*  BUN 7 12 14 15 17   CREATININE 0.97 1.44* 1.44* 1.31* 1.38*  CALCIUM  8.9 9.5 9.4 9.8 9.5    Liver Function Tests: No results for input(s):  AST, ALT, ALKPHOS, BILITOT, PROT, ALBUMIN in the last 168 hours.  No results for input(s): LIPASE, AMYLASE in the last 168 hours.   No results for input(s): AMMONIA in the last 168 hours.  CBC: Recent Labs  Lab 08/10/23 0846 08/11/23 0428 08/13/23 0456 08/14/23 0359 08/15/23 0529 08/16/23 0511  WBC 8.2 8.0 9.3 8.8 10.6* 10.7*  NEUTROABS 4.5 4.3 4.7 4.5  --   --   HGB 10.1* 10.4* 9.3* 9.1* 9.6* 8.8*  HCT 30.2* 31.1* 28.1* 27.5* 29.2* 26.2*  MCV 92.6 89.6 90.6 93.5 92.4 90.7  PLT 269 268 249 240 270 255    Cardiac Enzymes: No results for input(s): CKTOTAL, CKMB, CKMBINDEX, TROPONINI in the last 168 hours.  BNP: Invalid input(s): POCBNP  CBG: Recent Labs  Lab 08/15/23 1223 08/15/23 1741 08/15/23 2128 08/16/23 0839 08/16/23 1113  GLUCAP 158* 118* 129* 115* 143*    Microbiology: Results for orders placed or performed during the hospital encounter of 08/07/23  C Difficile Quick Screen w PCR reflex     Status: None   Collection Time: 08/07/23  6:59 PM   Specimen: STOOL  Result Value Ref Range Status   C Diff antigen NEGATIVE NEGATIVE Final   C Diff toxin NEGATIVE NEGATIVE Final   C Diff interpretation No C. difficile detected.  Final    Comment: Performed at [facility], [address] [place], [place] [address]  Urine Culture (for pregnant, neutropenic or urologic patients or patients with an indwelling urinary catheter)  Status: Abnormal   Collection Time: 08/07/23  8:20 PM   Specimen: Urine, Catheterized  Result Value Ref Range Status   Specimen Description   Final    URINE, CATHETERIZED Performed at [facility], [address] [place], [place] [address]    Special Requests   Final    NONE Performed at [facility], [address] [place], [place] [address]    Culture (A)  Final    >=100,000 COLONIES/mL METHICILLIN RESISTANT STAPHYLOCOCCUS AUREUS   Report Status 08/10/2023 FINAL  Final    Organism ID, Bacteria METHICILLIN RESISTANT STAPHYLOCOCCUS AUREUS (A)  Final      Susceptibility   Methicillin resistant staphylococcus aureus - MIC*    CIPROFLOXACIN >=8 RESISTANT Resistant     GENTAMICIN <=0.5 SENSITIVE Sensitive     NITROFURANTOIN <=16 SENSITIVE Sensitive     OXACILLIN >=4 RESISTANT Resistant     TETRACYCLINE <=1 SENSITIVE Sensitive     VANCOMYCIN  1 SENSITIVE Sensitive     TRIMETH /SULFA  <=10 SENSITIVE Sensitive     RIFAMPIN <=0.5 SENSITIVE Sensitive     Inducible Clindamycin NEGATIVE Sensitive     LINEZOLID 2 SENSITIVE Sensitive     * >=100,000 COLONIES/mL METHICILLIN RESISTANT STAPHYLOCOCCUS AUREUS  Culture, blood (Routine X 2) w Reflex to ID Panel     Status: None   Collection Time: 08/07/23 11:29 PM   Specimen: BLOOD  Result Value Ref Range Status   Specimen Description BLOOD BLOOD RIGHT ARM  Final   Special Requests   Final    BOTTLES DRAWN AEROBIC AND ANAEROBIC Blood Culture results may not be optimal due to an inadequate volume of blood received in culture bottles   Culture   Final    NO GROWTH 5 DAYS Performed at [facility], [address] [place], [place] [address]    Report Status 08/13/2023 FINAL  Final  Culture, blood (Routine X 2) w Reflex to ID Panel     Status: None   Collection Time: 08/07/23 11:29 PM   Specimen: BLOOD  Result Value Ref Range Status   Specimen Description BLOOD BLOOD LEFT ARM  Final   Special Requests   Final    BOTTLES DRAWN AEROBIC AND ANAEROBIC Blood Culture results may not be optimal due to an inadequate volume of blood received in culture bottles   Culture   Final    NO GROWTH 5 DAYS Performed at [facility], [address], [place], [place] [address]    Report Status 08/13/2023 FINAL  Final  MRSA Next Gen by PCR, Nasal     Status: Abnormal   Collection Time: 08/10/23  6:00 AM   Specimen: Nasal Mucosa; Nasal Swab  Result Value Ref Range Status   MRSA by PCR Next Gen DETECTED (A) NOT DETECTED  Final    Comment: RESULT CALLED TO, READ BACK BY AND VERIFIED WITH: Ave, Katrina 08/10/23 0732 MW (NOTE) The GeneXpert MRSA Assay (FDA approved for NASAL specimens only), is one component of a comprehensive MRSA colonization surveillance program. It is not intended to diagnose MRSA infection nor to guide or monitor treatment for MRSA infections. Test performance is not FDA approved in patients less than 48 years old. Performed at [facility], [address] [place], [place] [address]     Coagulation Studies: No results for input(s): LABPROT, INR in the last 72 hours.   Urinalysis: No results for input(s): COLORURINE, LABSPEC, PHURINE, GLUCOSEU, HGBUR, BILIRUBINUR, KETONESUR, PROTEINUR, UROBILINOGEN, NITRITE, LEUKOCYTESUR in the last 72 hours.  Invalid input(s): APPERANCEUR      Imaging: No results found.    Medications:       acidophilus  1 capsule Oral Daily   aspirin   81 mg Oral Daily   atorvastatin   80 mg Oral Daily   Chlorhexidine  Gluconate Cloth  6 each Topical Q0600   feeding supplement (NEPRO CARB STEADY)  237 mL Oral BID BM   heparin  injection (subcutaneous)  5,000 Units Subcutaneous Q8H   insulin  aspart  0-5 Units Subcutaneous QHS   insulin  aspart  0-9 Units Subcutaneous TID WC   metoprolol  succinate  12.5 mg Oral Daily   multivitamin  1 tablet Oral QHS   mupirocin  ointment  1 Application Nasal BID   pantoprazole   40 mg Oral Daily   polyethylene glycol  17 g Oral Daily   psyllium  1 packet Oral Daily   sulfamethoxazole -trimethoprim   1 tablet Oral Q12H   acetaminophen  **OR** acetaminophen , ondansetron  **OR** [DISCONTINUED] ondansetron  (ZOFRAN ) IV, traZODone   Assessment/ Plan:  Sarah Lane is a 58 y.o.  female  is a 58 y.o. female with a PMHx of diabetes mellitus type 2, seizure disorder, hypertension, dysrhythmia, and history of ovarian cancer.  Patient presents to the emergency department complaining  of nausea, vomiting, and diarrhea.  Patient has been admitted for Acute gastroenteritis [K52.9] UTI (urinary tract infection) [N39.0] ESRD on hemodialysis (HCC) [N18.6, Z99.2] Cystitis [N30.90] Stercoral colitis [K52.89]  [facility]/T-S/right chest PermCath  Acute Kidney Injury on chronic kidney disease stage IIIa with baseline creatinine 1.27 and GFR of 49 on 04/07/23.  Now receiving hemodialysis, initiated on 12/3. Acute kidney injury resulting in ATN secondary to volume depletion and continued ACE/ARB use. -Last dialysis treatment received on 12/31 - Creatinine remained stable - Patient incontinent - Denies uremic symptoms - We feel patient has recovered and is no longer in need of dialysis. - Vascular surgery consulted to remove right chest PermCath - Patient will need to establish care and follow-up with nephrology at discharge.  Lab Results  Component Value Date   CREATININE 1.38 (H) 08/16/2023   CREATININE 1.31 (H) 08/15/2023   CREATININE 1.44 (H) 08/14/2023    Intake/Output Summary (Last 24 hours) at 08/16/2023 1521 Last data filed at 08/16/2023 1423 Gross per 24 hour  Intake 0 ml  Output --  Net 0 ml    2.  Hypokalemia.  Potassium 4.6  3. Anemia of chronic kidney disease Lab Results  Component Value Date   HGB 8.8 (L) 08/16/2023    Hemoglobin 8.8.  Will consider low-dose EPO.  4. Diabetes mellitus type II with chronic kidney disease: noninsulin dependent. Home regimen includes metformin. Most recent hemoglobin A1c is 8.2 on 03/31/23.   Primary team to manage sliding scale insulin .  5. Secondary Hyperparathyroidism: with outpatient labs: PTH 153, phosphorus 3.8, calcium  10.8 on 07/29/23.   Lab Results  Component Value Date   CALCIUM  9.5 08/16/2023   CAION 1.22 04/06/2023   PHOS 2.3 (L) 07/17/2023    Will continue to monitor bone minerals during this admission.   LOS: 8 Austen Oyster 1/6/20253:21 PM

## 2023-08-16 NOTE — Plan of Care (Signed)
  Problem: Fluid Volume: Goal: Hemodynamic stability will improve Outcome: Progressing   Problem: Respiratory: Goal: Ability to maintain adequate ventilation will improve Outcome: Progressing   Problem: Education: Goal: Ability to describe self-care measures that may prevent or decrease complications (Diabetes Survival Skills Education) will improve Outcome: Progressing Goal: Individualized Educational Video(s) Outcome: Progressing   Problem: Coping: Goal: Ability to adjust to condition or change in health will improve Outcome: Progressing   Problem: Nutritional: Goal: Maintenance of adequate nutrition will improve Outcome: Progressing Goal: Progress toward achieving an optimal weight will improve Outcome: Progressing   Problem: Skin Integrity: Goal: Risk for impaired skin integrity will decrease Outcome: Progressing   Problem: Nutrition: Goal: Adequate nutrition will be maintained Outcome: Progressing   Problem: Activity: Goal: Risk for activity intolerance will decrease Outcome: Progressing   Problem: Elimination: Goal: Will not experience complications related to bowel motility Outcome: Progressing Goal: Will not experience complications related to urinary retention Outcome: Progressing   Problem: Pain Management: Goal: General experience of comfort will improve Outcome: Progressing   Problem: Safety: Goal: Ability to remain free from injury will improve Outcome: Progressing   Problem: Skin Integrity: Goal: Risk for impaired skin integrity will decrease Outcome: Progressing

## 2023-08-17 ENCOUNTER — Encounter: Payer: Self-pay | Admitting: Vascular Surgery

## 2023-08-17 ENCOUNTER — Encounter
Admission: EM | Disposition: A | Discharge status: Skilled Nursing Facility | Payer: Self-pay | Source: Home / Self Care | Attending: Internal Medicine

## 2023-08-17 DIAGNOSIS — N19 Unspecified kidney failure: Secondary | ICD-10-CM | POA: Diagnosis not present

## 2023-08-17 DIAGNOSIS — Z7982 Long term (current) use of aspirin: Secondary | ICD-10-CM | POA: Diagnosis not present

## 2023-08-17 DIAGNOSIS — Z452 Encounter for adjustment and management of vascular access device: Secondary | ICD-10-CM | POA: Diagnosis not present

## 2023-08-17 DIAGNOSIS — Z9289 Personal history of other medical treatment: Secondary | ICD-10-CM | POA: Diagnosis not present

## 2023-08-17 DIAGNOSIS — Z79899 Other long term (current) drug therapy: Secondary | ICD-10-CM

## 2023-08-17 HISTORY — PX: DIALYSIS/PERMA CATHETER REMOVAL: CATH118289

## 2023-08-17 LAB — GLUCOSE, CAPILLARY
Glucose-Capillary: 109 mg/dL — ABNORMAL HIGH (ref 70–99)
Glucose-Capillary: 164 mg/dL — ABNORMAL HIGH (ref 70–99)

## 2023-08-17 SURGERY — DIALYSIS/PERMA CATHETER REMOVAL
Anesthesia: LOCAL

## 2023-08-17 MED ORDER — LIDOCAINE-EPINEPHRINE (PF) 1 %-1:200000 IJ SOLN
INTRAMUSCULAR | Status: DC | PRN
  Administered 2023-08-17: 20 mL via INTRADERMAL

## 2023-08-17 SURGICAL SUPPLY — 1 items: TRAY LACERAT/PLASTIC (MISCELLANEOUS) IMPLANT

## 2023-08-17 NOTE — Op Note (Signed)
  OPERATIVE NOTE   PROCEDURE: Removal of a right IJ tunneled dialysis catheter  PRE-OPERATIVE DIAGNOSIS: Complication of dialysis catheter, End stage renal disease  POST-OPERATIVE DIAGNOSIS: Same  SURGEON: Cordella Shawl, M.D.  ANESTHESIA: Local anesthetic with 1% lidocaine  with epinephrine    ESTIMATED BLOOD LOSS: Minimal   FINDING(S): 1. Catheter intact   SPECIMEN(S):  Catheter  INDICATIONS:   Sarah Lane is a 58 y.o. female who presents with recovery of her baseline renal function.  She is no longer receiving dialysis and therefore no longer needs her tunneled catheter.  The patient has undergone placement of an extremity access which is working and this has been successfully cannulated without difficulty.  therefore is undergoing removal of his tunneled catheter which is no longer needed to avoid septic complications.   DESCRIPTION: After obtaining full informed written consent, the patient was positioned supine. The right IJ tunneled catheter and surrounding area is prepped and draped in a sterile fashion. The cuff was localized by palpation and noted to be less than 3 cm from the exit site. After appropriate timeout is called, 1% lidocaine  with epinephrine  is infiltrated into the surrounding tissues around the cuff. Small transverse incision is created at the exit site with an 11 blade scalpel and the dissection was carried up along the catheter to expose the cuff of the tunneled catheter.  The catheter cuff is then freed from the surrounding attachments and adhesions. Once the catheter has been freed circumferentially it is removed in 1 piece. Light pressure was held at the base of the neck.   Antibiotic ointment and a sterile dressing is applied to the exit site. Patient tolerated procedure well and there were no complications.  COMPLICATIONS: None  CONDITION: Unchanged  Cordella Shawl, M.D. [facility] Office: [phone]  08/17/2023,8:48 AM

## 2023-08-17 NOTE — TOC Progression Note (Signed)
 Transition of Care [facility] - Progression Note    Patient Details  Name: Sarah Lane MRN: [id number] Date of Birth: July 27, 1966  [facility]) CM/SW Contact  [name], RN Phone Number: 08/17/2023, 9:38 AM  Clinical Narrative:     The patient is scheduled to go to [facility] in [place] room 117 A  EMS is confirmed to be scheduled at 230 today The bedside nurse is to call report to [phone], the patient will notify her family  Expected Discharge Plan: Skilled Nursing Facility Barriers to Discharge: SNF Pending bed offer  Expected Discharge Plan and Services   Discharge Planning Services: CM Consult   Living arrangements for the past 2 months: Mobile Home Expected Discharge Date: 08/17/23                 DME Agency: NA                   Social Determinants of Health (SDOH) Interventions SDOH Screenings   Food Insecurity: Food Insecurity Present (08/09/2023)  Housing: Low Risk  (08/09/2023)  Recent Concern: Housing - Medium Risk (07/10/2023)  Transportation Needs: Unmet Transportation Needs (08/09/2023)  Utilities: Not At Risk (08/09/2023)  Financial Resource Strain: Medium Risk (05/17/2023)   [facility]  Physical Activity: Insufficiently Active (05/17/2023)   Received from [facility]  Social Connections: Socially Isolated (05/18/2023)   Received from [facility]  Stress: No Stress Concern Present (05/18/2023)   Received from [facility]  Tobacco Use: Unknown (08/07/2023)  Health Literacy: Medium Risk (05/17/2023)   Received from [facility]    Readmission Risk Interventions    07/11/2023   11:51 AM  Readmission Risk Prevention Plan  Transportation Screening Complete  PCP or Specialist Appt within 5-7 Days Complete  Home Care Screening Complete  Medication Review (RN CM) Complete

## 2023-08-17 NOTE — Progress Notes (Signed)
 [facility]  ROUNDING NOTE   Subjective:   Sarah Lane  is a 58 y.o. female with a PMHx of diabetes mellitus type 2, seizure disorder, hypertension, dysrhythmia, and history of ovarian cancer.  Patient presents to the emergency department with nausea and vomiting. She has been admitted for Acute gastroenteritis [K52.9] UTI (urinary tract infection) [N39.0] ESRD on hemodialysis (HCC) [N18.6, Z99.2] Cystitis [N30.90] Stercoral colitis [K52.89]  Patient seen sitting on side of bed Alert and oriented Preparing her belongings for discharge Rt chest pemcath has been removed.    Objective:  Vital signs in last 24 hours:  Temp:  [97.8 F (36.6 C)-98.2 F (36.8 C)] 98.1 F (36.7 C) (01/07 0920) Pulse Rate:  [73-89] 89 (01/07 0920) Resp:  [16-18] 17 (01/07 0920) BP: (103-140)/(60-71) 136/60 (01/07 0920) SpO2:  [94 %-99 %] 99 % (01/07 0920)  Weight change:  Filed Weights   08/07/23 1431 08/10/23 0810 08/10/23 1245  Weight: 58.1 kg 60.3 kg 59.2 kg    Intake/Output: I/O last 3 completed shifts: In: 780 [P.O.:780] Out: -    Intake/Output this shift:  No intake/output data recorded.  Physical Exam: General: NAD, Sitting at bedside  Head: Normocephalic, atraumatic.  Dry oral mucosal membranes  Eyes: Anicteric  Lungs:  Clear to auscultation, normal effort  Heart: Regular rate and rhythm  Abdomen:  Soft, nontender, nondistended  Extremities: No peripheral edema.  Neurologic: Alert and oriented, moving all four extremities  Skin: No lesions  Access: None    Basic Metabolic Panel: Recent Labs  Lab 08/11/23 0428 08/13/23 0456 08/14/23 0359 08/15/23 0529 08/16/23 0511  NA 135 136 139 139 139  K 3.8 4.1 4.1 4.9 4.6  CL 102 103 109 107 107  CO2 24 27 25 25 24   GLUCOSE 114* 104* 134* 114* 107*  BUN 7 12 14 15 17   CREATININE 0.97 1.44* 1.44* 1.31* 1.38*  CALCIUM  8.9 9.5 9.4 9.8 9.5    Liver Function Tests: No results for input(s): AST, ALT,  ALKPHOS, BILITOT, PROT, ALBUMIN in the last 168 hours.  No results for input(s): LIPASE, AMYLASE in the last 168 hours.   No results for input(s): AMMONIA in the last 168 hours.  CBC: Recent Labs  Lab 08/11/23 0428 08/13/23 0456 08/14/23 0359 08/15/23 0529 08/16/23 0511  WBC 8.0 9.3 8.8 10.6* 10.7*  NEUTROABS 4.3 4.7 4.5  --   --   HGB 10.4* 9.3* 9.1* 9.6* 8.8*  HCT 31.1* 28.1* 27.5* 29.2* 26.2*  MCV 89.6 90.6 93.5 92.4 90.7  PLT 268 249 240 270 255    Cardiac Enzymes: No results for input(s): CKTOTAL, CKMB, CKMBINDEX, TROPONINI in the last 168 hours.  BNP: Invalid input(s): POCBNP  CBG: Recent Labs  Lab 08/16/23 1113 08/16/23 1555 08/16/23 2105 08/17/23 0758 08/17/23 1151  GLUCAP 143* 144* 141* 109* 164*    Microbiology: Results for orders placed or performed during the hospital encounter of 08/07/23  C Difficile Quick Screen w PCR reflex     Status: None   Collection Time: 08/07/23  6:59 PM   Specimen: STOOL  Result Value Ref Range Status   C Diff antigen NEGATIVE NEGATIVE Final   C Diff toxin NEGATIVE NEGATIVE Final   C Diff interpretation No C. difficile detected.  Final    Comment: Performed at [facility], [address] [place], [place] [address]  Urine Culture (for pregnant, neutropenic or urologic patients or patients with an indwelling urinary catheter)     Status: Abnormal  Collection Time: 08/07/23  8:20 PM   Specimen: Urine, Catheterized  Result Value Ref Range Status   Specimen Description   Final    URINE, CATHETERIZED Performed at [facility], [address] [place], [place] [address]    Special Requests   Final    NONE Performed at [facility], [address] [place], [place] [address]    Culture (A)  Final    >=100,000 COLONIES/mL METHICILLIN RESISTANT STAPHYLOCOCCUS AUREUS   Report Status 08/10/2023 FINAL  Final   Organism ID, Bacteria METHICILLIN RESISTANT STAPHYLOCOCCUS  AUREUS (A)  Final      Susceptibility   Methicillin resistant staphylococcus aureus - MIC*    CIPROFLOXACIN >=8 RESISTANT Resistant     GENTAMICIN <=0.5 SENSITIVE Sensitive     NITROFURANTOIN <=16 SENSITIVE Sensitive     OXACILLIN >=4 RESISTANT Resistant     TETRACYCLINE <=1 SENSITIVE Sensitive     VANCOMYCIN  1 SENSITIVE Sensitive     TRIMETH /SULFA  <=10 SENSITIVE Sensitive     RIFAMPIN <=0.5 SENSITIVE Sensitive     Inducible Clindamycin NEGATIVE Sensitive     LINEZOLID 2 SENSITIVE Sensitive     * >=100,000 COLONIES/mL METHICILLIN RESISTANT STAPHYLOCOCCUS AUREUS  Culture, blood (Routine X 2) w Reflex to ID Panel     Status: None   Collection Time: 08/07/23 11:29 PM   Specimen: BLOOD  Result Value Ref Range Status   Specimen Description BLOOD BLOOD RIGHT ARM  Final   Special Requests   Final    BOTTLES DRAWN AEROBIC AND ANAEROBIC Blood Culture results may not be optimal due to an inadequate volume of blood received in culture bottles   Culture   Final    NO GROWTH 5 DAYS Performed at [facility], [address] [place], [place] [address]    Report Status 08/13/2023 FINAL  Final  Culture, blood (Routine X 2) w Reflex to ID Panel     Status: None   Collection Time: 08/07/23 11:29 PM   Specimen: BLOOD  Result Value Ref Range Status   Specimen Description BLOOD BLOOD LEFT ARM  Final   Special Requests   Final    BOTTLES DRAWN AEROBIC AND ANAEROBIC Blood Culture results may not be optimal due to an inadequate volume of blood received in culture bottles   Culture   Final    NO GROWTH 5 DAYS Performed at [facility], [address] [place], [place] [address]    Report Status 08/13/2023 FINAL  Final  MRSA Next Gen by PCR, Nasal     Status: Abnormal   Collection Time: 08/10/23  6:00 AM   Specimen: Nasal Mucosa; Nasal Swab  Result Value Ref Range Status   MRSA by PCR Next Gen DETECTED (A) NOT DETECTED Final    Comment: RESULT CALLED TO, READ BACK BY AND VERIFIED  WITH: Ave, Katrina 08/10/23 0732 MW (NOTE) The GeneXpert MRSA Assay (FDA approved for NASAL specimens only), is one component of a comprehensive MRSA colonization surveillance program. It is not intended to diagnose MRSA infection nor to guide or monitor treatment for MRSA infections. Test performance is not FDA approved in patients less than 10 years old. Performed at [facility], [address] [place], [place] [address]     Coagulation Studies: No results for input(s): LABPROT, INR in the last 72 hours.   Urinalysis: No results for input(s): COLORURINE, LABSPEC, PHURINE, GLUCOSEU, HGBUR, BILIRUBINUR, KETONESUR, PROTEINUR, UROBILINOGEN, NITRITE, LEUKOCYTESUR in the last 72 hours.  Invalid input(s): APPERANCEUR  Imaging: PERIPHERAL VASCULAR CATHETERIZATION Result Date: 08/17/2023 See surgical note for result.     Medications:       acidophilus  1 capsule Oral Daily   aspirin   81 mg Oral Daily   atorvastatin   80 mg Oral Daily   Chlorhexidine  Gluconate Cloth  6 each Topical Q0600   feeding supplement (NEPRO CARB STEADY)  237 mL Oral BID BM   heparin  injection (subcutaneous)  5,000 Units Subcutaneous Q8H   insulin  aspart  0-5 Units Subcutaneous QHS   insulin  aspart  0-9 Units Subcutaneous TID WC   metoprolol  succinate  12.5 mg Oral Daily   multivitamin  1 tablet Oral QHS   mupirocin  ointment  1 Application Nasal BID   pantoprazole   40 mg Oral Daily   polyethylene glycol  17 g Oral Daily   psyllium  1 packet Oral Daily   acetaminophen  **OR** acetaminophen , ondansetron  **OR** [DISCONTINUED] ondansetron  (ZOFRAN ) IV, traZODone   Assessment/ Plan:  Sarah Lane is a 58 y.o.  female  is a 58 y.o. female with a PMHx of diabetes mellitus type 2, seizure disorder, hypertension, dysrhythmia, and history of ovarian cancer.  Patient presents to the emergency department complaining of nausea, vomiting, and diarrhea.   Patient has been admitted for Acute gastroenteritis [K52.9] UTI (urinary tract infection) [N39.0] ESRD on hemodialysis (HCC) [N18.6, Z99.2] Cystitis [N30.90] Stercoral colitis [K52.89]  [facility]/T-S/right chest PermCath  Acute Kidney Injury on chronic kidney disease stage IIIa with baseline creatinine 1.27 and GFR of 49 on 04/07/23.  Now receiving hemodialysis, initiated on 12/3. Acute kidney injury resulting in ATN secondary to volume depletion and continued ACE/ARB use. -Last dialysis treatment received on 12/31 - Creatinine stable - Appreciate vascular surgery removing right chest PermCath - Patient will need to establish care and follow-up with nephrology at discharge.  Lab Results  Component Value Date   CREATININE 1.38 (H) 08/16/2023   CREATININE 1.31 (H) 08/15/2023   CREATININE 1.44 (H) 08/14/2023    Intake/Output Summary (Last 24 hours) at 08/17/2023 1403 Last data filed at 08/16/2023 1923 Gross per 24 hour  Intake 300 ml  Output --  Net 300 ml    2.  Hypokalemia.  Potassium 4.6  3. Anemia of chronic kidney disease Lab Results  Component Value Date   HGB 8.8 (L) 08/16/2023    Hemoglobin stable 8.8.    4. Diabetes mellitus type II with chronic kidney disease: noninsulin dependent. Home regimen includes metformin. Most recent hemoglobin A1c is 8.2 on 03/31/23.   Primary team to manage sliding scale insulin .  5. Secondary Hyperparathyroidism: with outpatient labs: PTH 153, phosphorus 3.8, calcium  10.8 on 07/29/23.   Lab Results  Component Value Date   CALCIUM  9.5 08/16/2023   CAION 1.22 04/06/2023   PHOS 2.3 (L) 07/17/2023    Will continue to monitor bone minerals during this admission.   LOS: 9 Demarrion Meiklejohn 1/7/20252:03 PM

## 2023-08-17 NOTE — Progress Notes (Signed)
 Gave report to specials. Pt go to today to get her port removed. Attempted to talk with pt regarding the above. Pt refused to allow this RN to touch or assess her. Pt informed this RN that she was eating breakfast first. Educated that she would be leaving shortly. Pt does not have an IV at this time. Educated specials of this. Stated they would send for patient at this time.

## 2023-08-17 NOTE — Progress Notes (Signed)
 Nsg Discharge Note  Admit Date:  08/07/2023 Discharge date: 08/17/2023   Ricka A Roane to be D/C'd Rehab per MD order.  AVS completed.  Copy for chart, and copy for patient signed, and dated. Patient/caregiver able to verbalize understanding.  Discharge Medication: Allergies as of 08/17/2023       Reactions   Jardiance [empagliflozin]    Multiple side effect complications including: DKA, pancreatitis, renal failure. Avoid re-prescribing   Codeine Nausea And Vomiting   Ibuprofen Nausea And Vomiting        Medication List     TAKE these medications    acidophilus Caps capsule Take 1 capsule by mouth daily.   aspirin  81 MG chewable tablet Chew 81 mg by mouth in the morning.   atorvastatin  80 MG tablet Commonly known as: LIPITOR  Take 80 mg by mouth in the morning.   feeding supplement Liqd Take 237 mLs by mouth 2 (two) times daily between meals.   metoprolol  succinate 25 MG 24 hr tablet Commonly known as: TOPROL -XL Take 12.5 mg by mouth in the morning.   multivitamin Tabs tablet Take 1 tablet by mouth at bedtime.   omeprazole  40 MG capsule Commonly known as: PRILOSEC Take 1 capsule (40 mg total) by mouth daily.   polyethylene glycol 17 g packet Commonly known as: MIRALAX  / GLYCOLAX  Take 17 g by mouth daily.   psyllium 95 % Pack Commonly known as: HYDROCIL/METAMUCIL Take 1 packet by mouth daily.        Discharge Assessment: Vitals:   08/17/23 0855 08/17/23 0920  BP: 124/71 136/60  Pulse:  89  Resp:  17  Temp:  98.1 F (36.7 C)  SpO2: 97% 99%   Skin clean, dry and intact without evidence of skin break down, no evidence of skin tears noted. IV catheter discontinued intact. Site without signs and symptoms of complications - no redness or edema noted at insertion site, patient denies c/o pain - only slight tenderness at site.  Dressing with slight pressure applied.  D/c Instructions-Education: Discharge instructions given to patient/family with verbalized  understanding. D/c education completed with patient/family including follow up instructions, medication list, d/c activities limitations if indicated, with other d/c instructions as indicated by MD - patient able to verbalize understanding, all questions fully answered. Patient instructed to return to ED, call 911, or call MD for any changes in condition.  Patient escorted via EMS to rehab  Almarie DELENA Garret, RN 08/17/2023 5:45 PM

## 2023-08-17 NOTE — Progress Notes (Signed)
 Progress Note   Patient: Sarah Lane FMW:[id number] DOB: [birth year] DOA: 08/07/2023     9 DOS: the patient was seen and examined on 08/17/2023   Brief hospital course: 58yo with h/o asthma, DM, HTN, PSVT, seizure d/o, ESRD recently started on HD, and ovarian CA s/p TAH/BSO who presented on 12/28 with n/v/d.  UA with >100k colonies Staph aureus.  Completed treatment, needs HD cath pulled (currently being monitored for renal recovery without HD), discharged to SNF.  Assessment and Plan:  N/V/D Patient presented with 1 week of n/v/d Imaging with dilated rectum and concern for stercoral colitis She was started on Ceftriaxone  and metronidazole  -> Bactrim  C diff negative Needs a good bowel regimen Overflow diarrhea appears to have resolved Continue Miralax , metamucil   UTI Sepsis ruled out UA with >100k colonies Staph aureus, MRSA Started on ceftriaxone  -> Bactrim   Blood cultures NTD Dr. Fayette recommended 7 days of treatment and no further testing   Recent onset ESRD Stage 3b CKD on admission in 03/2023, bordering on stage 4 Cr 5.85 on admission on 11/29 with anuria not responsive to IVF Vascular surgery consulted for HD access during that admission, s/p Right IJ tunneled dialysis catheter  Initiated inpatient -> outpatient HD  Requiring only twice weekly HD at the time of admission Continue Renavite Per nephrology, she may be making sufficient urine for renal recovery; completed 24 hour urine studies Holding HD in the interim Will need close ongoing monitoring as an outpatient without current plan to resume HD   DM Recent A1c was 5.3 She is not requiring medications for this issue currently   Dyslipidemia Continue Lipitor    Essential hypertension Continue Toprol -XL   GERD without esophagitis On PPI   Chronic HFrEF (heart failure with reduced ejection fraction)  Echo on 8/22 with EF 30-35%, grade 1 diastolic dysfunction Appears compensated   CAD R/LHC on 04/06/23  with severe single-vessel CAD with occlusion to vessel too small and distal for PCI Medication management   Nutrition Status: Nutrition Problem: Moderate Malnutrition Etiology: social / environmental circumstances Signs/Symptoms: percent weight loss, mild fat depletion, moderate fat depletion, mild muscle depletion, moderate muscle depletion Interventions: MVI, Nepro shake    Other Appears likely to need additional housing, as her roommate situation is not working out per patient report PT/OT evaluated and are recommending SNF rehab She will also need assistance with housing resources post-rehab She is willing to give up her monthly check in order to make a facility happen and is awaiting placement at this time and is medically stable for ds to SNF in [place]       Consultants: ID PT OT Nutrition TOC team   Procedures: None   Antibiotics: Ceftriaxone  12/29-31 Bactrim  12/31-1/6    30 Day Unplanned Readmission Risk Score    Flowsheet Row ED to Hosp-Admission (Current) from 08/07/2023 in [facility] (1A)  30 Day Unplanned Readmission Risk Score (%) 24.84 Filed at 08/17/2023 0401       This score is the patient's risk of an unplanned readmission within 30 days of being discharged (0 -100%). The score is based on dignosis, age, lab data, medications, orders, and past utilization.   Low:  0-14.9   Medium: 15-21.9   High: 22-29.9   Extreme: 30 and above           Subjective: Did not dc yesterday to SNF due to transportation and need for HD cath removal.  Catheter removed this AM.  Stable for dc.  Objective: Vitals:   08/16/23 1552 08/16/23 2247  BP: 110/67 103/63  Pulse: 81 73  Resp: 18 18  Temp: 97.8 F (36.6 C) 98.2 F (36.8 C)  SpO2: 97% 94%    Intake/Output Summary (Last 24 hours) at 08/17/2023 0737 Last data filed at 08/16/2023 1923 Gross per 24 hour  Intake 780 ml  Output --  Net 780 ml   Filed Weights   08/07/23  1431 08/10/23 0810 08/10/23 1245  Weight: 58.1 kg 60.3 kg 59.2 kg    Exam:  General:  Appears calm and comfortable and is in NAD Eyes:  EOMI, normal lids, iris ENT:  grossly normal hearing, lips & tongue, mmm Neck:  no LAD, masses or thyromegaly Cardiovascular:  RRR. No LE edema.  Respiratory:   CTA bilaterally with no wheezes/rales/rhonchi.  Normal respiratory effort. Abdomen:  soft, NT, ND Skin:  no rash or induration seen on limited exam Musculoskeletal:  grossly normal tone BUE/BLE, good ROM, no bony abnormality Psychiatric:  pleasant mood and affect, speech appropriate but with some hesitation, AOx3 Neurologic:  CN 2-12 grossly intact, moves all extremities in coordinated fashion  Data Reviewed: I have reviewed the patient's lab results since admission.  Pertinent labs for today include:   None       Time spent: 25 minutes  Unresulted Labs (From admission, onward)    None        Author: Delon Herald, MD 08/17/2023 7:37 AM  For on call review [email or web address].

## 2023-08-17 NOTE — Plan of Care (Signed)
   Problem: Fluid Volume: Goal: Hemodynamic stability will improve Outcome: Progressing   Problem: Clinical Measurements: Goal: Diagnostic test results will improve Outcome: Progressing Goal: Signs and symptoms of infection will decrease Outcome: Progressing   Problem: Respiratory: Goal: Ability to maintain adequate ventilation will improve Outcome: Progressing

## 2023-08-17 NOTE — Progress Notes (Signed)
 Report called to [facility] RN at [facility]. Questions asked and answered.

## 2023-08-17 NOTE — TOC Progression Note (Signed)
 Transition of Care [facility]) - Progression Note    Patient Details  Name: Sarah Lane MRN: [id number] Date of Birth: 15-Sep-1965  Transition of Care [facility] CM/SW Contact  [name], RN Phone Number: 08/17/2023, 4:29 PM  Clinical Narrative:     EMS is running behind today and is still planning on coming but may be later this evening, The facility is aware as is the patient  Expected Discharge Plan: Skilled Nursing Facility Barriers to Discharge: SNF Pending bed offer  Expected Discharge Plan and Services   Discharge Planning Services: CM Consult   Living arrangements for the past 2 months: Mobile Home Expected Discharge Date: 08/17/23                 DME Agency: NA                   Social Determinants of Health (SDOH) Interventions SDOH Screenings   Food Insecurity: Food Insecurity Present (08/09/2023)  Housing: Low Risk  (08/09/2023)  Recent Concern: Housing - Medium Risk (07/10/2023)  Transportation Needs: Unmet Transportation Needs (08/09/2023)  Utilities: Not At Risk (08/09/2023)  Financial Resource Strain: Medium Risk (05/17/2023)   Received from [facility]  Physical Activity: Insufficiently Active (05/17/2023)   Received from [facility]  Social Connections: Socially Isolated (05/18/2023)   [facility] from [facility]  Stress: No Stress Concern Present (05/18/2023)   Received from [facility]  Tobacco Use: Unknown (08/07/2023)  Health Literacy: Medium Risk (05/17/2023)   [facility] from [facility]    Readmission Risk Interventions    07/11/2023   11:51 AM  Readmission Risk Prevention Plan  Transportation Screening Complete  PCP or Specialist Appt within 5-7 Days Complete  Home Care Screening Complete  Medication Review (RN CM) Complete

## 2023-08-17 NOTE — Progress Notes (Signed)
 Patient refused bed alarms, blood sugars and demanded to know when her ride would arrive. Pt educated on EMS ETA. Pt verbalizes understanding and continued to demand to be transferred. Pt asking for AMA paperwork. Explained to pt EMS would not take her if she was AMA. Pt stated she would wait.

## 2023-08-17 NOTE — Consult Note (Signed)
 MRN : [id number]  Sarah Lane is a 58 y.o. (March 19, 1966) female who presents with chief complaint of check access.  History of Present Illness:   I am asked by Ms. breeze to evaluate for removal of the dialysis catheter.  The patient is a 58 year old woman with multiple medical problems who underwent placement of a tunneled catheter secondary to acute on chronic renal insufficiency in November 2024.  At that time her creatinine was 5.85 and she was an uric.  Since that initiation of hemodialysis her kidney function has recovered and at this point she is no longer receiving dialysis.  There have not been multiple episodes of catheter infection.  The patient denies fever and chills while on dialysis.  No tenderness or drainage at the exit site.  No recent shortening of the patient's walking distance or new symptoms consistent with claudication.  No history of rest pain symptoms. No new ulcers or wounds of the lower extremities have occurred.  The patient denies amaurosis fugax or recent TIA symptoms. There are no recent neurological changes noted. There is no history of DVT, PE or superficial thrombophlebitis. No recent episodes of angina or shortness of breath documented.   Current Meds  Medication Sig   aspirin  81 MG chewable tablet Chew 81 mg by mouth in the morning.   atorvastatin  (LIPITOR ) 80 MG tablet Take 80 mg by mouth in the morning.   metoprolol  succinate (TOPROL -XL) 25 MG 24 hr tablet Take 12.5 mg by mouth in the morning.   multivitamin (RENA-VIT) TABS tablet Take 1 tablet by mouth at bedtime.   omeprazole  (PRILOSEC) 40 MG capsule Take 1 capsule (40 mg total) by mouth daily.    Past Medical History:  Diagnosis Date   Aortic atherosclerosis (HCC)    a. 03/2023 noted on CT.   Asthma    childhood asthma   Cancer (HCC)    ovarian cancer   Cardiomyopathy (HCC)    a. 01/2023 Echo [facility]): EF 15-20%, impaired relaxation, mildly to mod dil RV w/ mild RV  dysfxn, mod PH, BAE; b. 03/2023 Echo: EF 30-35%, glob HK, GrI DD, mod reduced RV fxn, mod enlarged RV, RVSP 42.41mmHg, mild MR, mild-mod TR.   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 Dx 01/2023-->[facility] Echo: EF 15-20%; b. 03/2023 Echo: EF 30-35%.   Diabetes (HCC)    Diabetes mellitus without complication (HCC)    Expressive aphasia    a. 01/2023 L MCA occlusive stroke s/p thrombectomy.   Family history of adverse reaction to anesthesia    Grand father had facial swelling   H/O ischemic left MCA stroke    a. 01/2023 s/p admission @ [facility]. CTA Head w/ M1 occlusion of L MCA, possible high-grade stenosis of distal A3 segment. S/p thrombectomy.   Hypertension    PSVT (paroxysmal supraventricular tachycardia) (HCC)    Right lower lobe pulmonary nodule    a. 03/2023 CT Chest - 14mm - stable.   Seizures (HCC)    petit mals at age 18 treated with medications. Last one age 36    Past Surgical History:  Procedure Laterality Date   ABDOMINAL HYSTERECTOMY  2011   ovarian cancer   DIALYSIS/PERMA CATHETER INSERTION N/A 07/13/2023   Procedure: DIALYSIS/PERMA CATHETER INSERTION;  Surgeon: Jama Cordella MATSU, MD;  Location: [facility] INVASIVE CV LAB;  Service: Cardiovascular;  Laterality: N/A;   IRRIGATION AND DEBRIDEMENT ABDOMEN N/A 12/31/2014   Procedure: IRRIGATION AND DEBRIDEMENT ABDOMINAL WALL ABSCESS;  Surgeon: Donnice Lunger, MD;  Location: [facility];  Service: General;  Laterality: N/A;   LYSIS OF ADHESION  06/23/2023   Procedure: LYSIS OF ADHESION;  Surgeon: Lane Shope, MD;  Location: [facility] ORS;  Service: General;;   RIGHT/LEFT HEART CATH AND CORONARY ANGIOGRAPHY N/A 04/06/2023   Procedure: RIGHT/LEFT HEART CATH AND CORONARY ANGIOGRAPHY;  Surgeon: Mady Bruckner, MD;  Location: [facility] INV[facility]IVE CV LAB;  Service: Cardiovascular;  Laterality: N/A;    Social History Social History   Tobacco Use   Smoking status: Never     Passive exposure: Never  Vaping Use   Vaping status: Never Used  Substance Use Topics   Alcohol use: No   Drug use: No    Family History Family History  Problem Relation Age of Onset   CAD Father        s/p CABG x 2 and stenting   Parkinson's disease Father     Allergies  Allergen Reactions   Jardiance [Empagliflozin]     Multiple side effect complications including: DKA, pancreatitis, renal failure. Avoid re-prescribing   Codeine Nausea And Vomiting   Ibuprofen Nausea And Vomiting     REVIEW OF SYSTEMS (Negative unless checked)  Constitutional: [] Weight loss  [] Fever  [] Chills Cardiac: [] Chest pain   [] Chest pressure   [] Palpitations   [] Shortness of breath when laying flat   [] Shortness of breath with exertion. Vascular:  [] Pain in legs with walking   [] Pain in legs at rest  [] History of DVT   [] Phlebitis   [] Swelling in legs   [] Varicose veins   [] Non-healing ulcers Pulmonary:   [] Uses home oxygen   [] Productive cough   [] Hemoptysis   [] Wheeze  [] COPD   [] Asthma Neurologic:  [] Dizziness   [] Seizures   [] History of stroke   [] History of TIA  [] Aphasia   [] Vissual changes   [] Weakness or numbness in arm   [] Weakness or numbness in leg Musculoskeletal:   [] Joint swelling   [] Joint pain   [] Low back pain Hematologic:  [] Easy bruising  [] Easy bleeding   [] Hypercoagulable state   [] Anemic Gastrointestinal:  [] Diarrhea   [] Vomiting  [] Gastroesophageal reflux/heartburn   [] Difficulty swallowing. Genitourinary:  [x] Chronic kidney disease   [] Difficult urination  [] Frequent urination   [] Blood in urine Skin:  [] Rashes   [] Ulcers  Psychological:  [] History of anxiety   []  History of major depression.  Physical Examination  Vitals:   08/16/23 1552 08/16/23 2247 08/17/23 0800 08/17/23 0820  BP: 110/67 103/63 136/64 (!) 140/64  Pulse: 81 73 80 78  Resp: 18 18 16 16   Temp: 97.8 F (36.6 C) 98.2 F (36.8 C) 97.8 F (36.6 C) 98 F (36.7 C)  TempSrc: Oral  Oral Oral  SpO2: 97%  94% 98% 97%  Weight:      Height:       Body mass index is 25.49 kg/m. Gen: WD/WN, NAD Head: [place]/AT, No temporalis wasting.  Ear/Nose/Throat: Hearing grossly intact, nares w/o erythema or drainage Eyes: PER, EOMI, sclera nonicteric.  Neck: Supple, no gross masses or lesions.  No JVD.  Pulmonary:  Good air movement, no audible wheezing, no use of accessory muscles.  Cardiac: RRR, precordium non-hyperdynamic. Vascular:   Tunnel catheter clean dry and intact Vessel Right Left  Radial Palpable Palpable  Brachial Palpable Palpable  Gastrointestinal: soft, non-distended. No guarding/no peritoneal signs.  Musculoskeletal: M/S 5/5 throughout.  No deformity.  Neurologic: CN 2-12 intact. Pain and light touch intact in extremities.  Symmetrical.  Speech is fluent. Motor exam as listed above. Psychiatric: Judgment intact, Mood & affect appropriate for pt's clinical situation. Dermatologic: No rashes or ulcers noted.  No changes consistent with cellulitis.   CBC Lab Results  Component Value Date   WBC 10.7 (H) 08/16/2023   HGB 8.8 (L) 08/16/2023   HCT 26.2 (L) 08/16/2023   MCV 90.7 08/16/2023   PLT 255 08/16/2023    BMET    Component Value Date/Time   NA 139 08/16/2023 0511   K 4.6 08/16/2023 0511   CL 107 08/16/2023 0511   CO2 24 08/16/2023 0511   GLUCOSE 107 (H) 08/16/2023 0511   BUN 17 08/16/2023 0511   CREATININE 1.38 (H) 08/16/2023 0511   CALCIUM  9.5 08/16/2023 0511   GFRNONAA 44 (L) 08/16/2023 0511   GFRAA >60 01/02/2015 0434   Estimated Creatinine Clearance: 35.3 mL/min (A) (by C-G formula based on SCr of 1.38 mg/dL (H)).  COAG Lab Results  Component Value Date   INR 1.1 08/08/2023    Radiology CT ABDOMEN PELVIS W CONTRAST Result Date: 08/07/2023 CLINICAL DATA:  Two day history of nausea, vomiting, and diarrhea EXAM: CT ABDOMEN AND PELVIS WITH CONTRAST TECHNIQUE: Multidetector CT imaging of the abdomen and pelvis was performed using the standard protocol following  bolus administration of intravenous contrast. RADIATION DOSE REDUCTION: This exam was performed according to the departmental dose-optimization program which includes automated exposure control, adjustment of the mA and/or kV according to patient size and/or use of iterative reconstruction technique. CONTRAST:  50mL OMNIPAQUE  IOHEXOL  350 MG/ML SOLN COMPARISON:  CT abdomen and pelvis dated 07/09/2023 and multiple priors including nuclear medicine PET dated 01/16/2020 FINDINGS: Lower chest: Partially imaged central venous catheter tip terminates in the right atrium. Unchanged non radiotracer avid 1.4 x 1.3 cm right lower lobe nodule. No pleural effusion or pneumothorax demonstrated. Partially imaged heart size is normal. Coronary artery calcifications. Hepatobiliary: No focal hepatic lesions. No intra or extrahepatic biliary ductal dilation. Cholecystectomy. Pancreas: No focal lesions or main ductal dilation. Spleen: Normal in size without focal abnormality. Adrenals/Urinary Tract: No adrenal nodules. No suspicious renal mass, calculi or hydronephrosis. Mild circumferential mural thickening of the urinary bladder. Stomach/Bowel: Normal appearance of the stomach. Mildly dilated rectum contains large volume stool and demonstrates mild circumferential mural thickening. Normal appendix. Vascular/Lymphatic: Aortic atherosclerosis. No enlarged abdominal or pelvic lymph nodes. Reproductive: No adnexal masses. Other: Mild presacral edema.  No free air or fluid collection. Musculoskeletal: No acute or abnormal lytic or blastic osseous lesions. Multilevel degenerative changes of the partially imaged thoracic and lumbar spine. Unchanged anterior wedging of T12 and L1 and superior endplate deformity of T11. Small paraumbilical hernia contains fat and a loop of nonobstructed small bowel. IMPRESSION: 1. Mildly dilated rectum contains large volume stool and demonstrates mild circumferential mural thickening, which may be seen in the  setting of stercoral colitis. 2. Mild circumferential mural thickening of the urinary bladder, which may be seen in the setting of cystitis. Recommend correlation with urinalysis. 3. Aortic Atherosclerosis (ICD10-I70.0). Coronary artery calcifications. Assessment for potential risk factor modification, dietary therapy or pharmacologic therapy may be warranted, if clinically indicated. Electronically Signed   By: Limin  Xu M.D.   On: 08/07/2023 19:33     Assessment/Plan Renal insufficiency: Patient no longer needs their tunnel catheter.  She has multiple medical problems and this places her at increased risk for sepsis.  Both infectious disease  as well as nephrology have recommended removal.  Risks and benefits have been reviewed all questions been answered will remove the catheter under local anesthetic.   Cordella Shawl, MD  08/17/2023 8:28 AM
